# Patient Record
Sex: Male | Born: 1955
Health system: Southern US, Community
[De-identification: ages and names within clinical notes are randomized; demographics above are authoritative.]

## PROBLEM LIST (undated history)

## (undated) DIAGNOSIS — R37 Sexual dysfunction, unspecified: Secondary | ICD-10-CM

## (undated) DIAGNOSIS — H35039 Hypertensive retinopathy, unspecified eye: Secondary | ICD-10-CM

## (undated) DIAGNOSIS — H269 Unspecified cataract: Secondary | ICD-10-CM

## (undated) DIAGNOSIS — F32A Depression, unspecified: Secondary | ICD-10-CM

## (undated) DIAGNOSIS — E785 Hyperlipidemia, unspecified: Secondary | ICD-10-CM

## (undated) DIAGNOSIS — K219 Gastro-esophageal reflux disease without esophagitis: Secondary | ICD-10-CM

## (undated) DIAGNOSIS — K5792 Diverticulitis of intestine, part unspecified, without perforation or abscess without bleeding: Secondary | ICD-10-CM

## (undated) DIAGNOSIS — D689 Coagulation defect, unspecified: Secondary | ICD-10-CM

## (undated) DIAGNOSIS — G510 Bell's palsy: Secondary | ICD-10-CM

## (undated) DIAGNOSIS — R51 Headache: Secondary | ICD-10-CM

## (undated) DIAGNOSIS — J449 Chronic obstructive pulmonary disease, unspecified: Secondary | ICD-10-CM

## (undated) DIAGNOSIS — H3320 Serous retinal detachment, unspecified eye: Secondary | ICD-10-CM

## (undated) DIAGNOSIS — G4730 Sleep apnea, unspecified: Secondary | ICD-10-CM

## (undated) DIAGNOSIS — I739 Peripheral vascular disease, unspecified: Secondary | ICD-10-CM

## (undated) DIAGNOSIS — I10 Essential (primary) hypertension: Secondary | ICD-10-CM

## (undated) DIAGNOSIS — F329 Major depressive disorder, single episode, unspecified: Secondary | ICD-10-CM

## (undated) DIAGNOSIS — S62362A Nondisplaced fracture of neck of third metacarpal bone, right hand, initial encounter for closed fracture: Secondary | ICD-10-CM

## (undated) HISTORY — DX: Coagulation defect, unspecified: D68.9

## (undated) HISTORY — DX: Peripheral vascular disease, unspecified: I73.9

## (undated) HISTORY — DX: Gastro-esophageal reflux disease without esophagitis: K21.9

## (undated) HISTORY — DX: Major depressive disorder, single episode, unspecified: F32.9

## (undated) HISTORY — DX: Sexual dysfunction, unspecified: R37

## (undated) HISTORY — DX: Essential (primary) hypertension: I10

## (undated) HISTORY — DX: Headache: R51

## (undated) HISTORY — DX: Depression, unspecified: F32.A

## (undated) HISTORY — DX: Nondisplaced fracture of neck of third metacarpal bone, right hand, initial encounter for closed fracture: S62.362A

## (undated) HISTORY — DX: Hypertensive retinopathy, unspecified eye: H35.039

## (undated) HISTORY — DX: Serous retinal detachment, unspecified eye: H33.20

## (undated) HISTORY — DX: Bell's palsy: G51.0

## (undated) HISTORY — DX: Diverticulitis of intestine, part unspecified, without perforation or abscess without bleeding: K57.92

## (undated) HISTORY — DX: Unspecified cataract: H26.9

## (undated) HISTORY — DX: Hyperlipidemia, unspecified: E78.5

## (undated) HISTORY — PX: CATARACT EXTRACTION: SUR2

---

## 2007-08-01 ENCOUNTER — Ambulatory Visit: Payer: Self-pay | Admitting: Nurse Practitioner

## 2007-08-01 DIAGNOSIS — R0602 Shortness of breath: Secondary | ICD-10-CM | POA: Insufficient documentation

## 2007-08-01 DIAGNOSIS — Z87891 Personal history of nicotine dependence: Secondary | ICD-10-CM

## 2007-08-01 DIAGNOSIS — I10 Essential (primary) hypertension: Secondary | ICD-10-CM

## 2007-08-02 ENCOUNTER — Encounter (INDEPENDENT_AMBULATORY_CARE_PROVIDER_SITE_OTHER): Payer: Self-pay | Admitting: Nurse Practitioner

## 2007-08-04 ENCOUNTER — Ambulatory Visit: Payer: Self-pay | Admitting: *Deleted

## 2007-08-05 ENCOUNTER — Ambulatory Visit (HOSPITAL_COMMUNITY): Admission: RE | Admit: 2007-08-05 | Discharge: 2007-08-05 | Payer: Self-pay | Admitting: Nurse Practitioner

## 2007-08-06 DIAGNOSIS — R972 Elevated prostate specific antigen [PSA]: Secondary | ICD-10-CM | POA: Insufficient documentation

## 2007-08-06 LAB — CONVERTED CEMR LAB
Albumin: 4.8 g/dL (ref 3.5–5.2)
Basophils Relative: 0 % (ref 0–1)
Creatinine, Ser: 0.72 mg/dL (ref 0.40–1.50)
Eosinophils Absolute: 0.4 10*3/uL (ref 0.2–0.7)
Glucose, Bld: 86 mg/dL (ref 70–99)
Hemoglobin: 17 g/dL (ref 13.0–17.0)
Lymphs Abs: 2.4 10*3/uL (ref 0.7–4.0)
Monocytes Relative: 10 % (ref 3–12)
Neutrophils Relative %: 59 % (ref 43–77)
Platelets: 248 10*3/uL (ref 150–400)
Potassium: 4.6 meq/L (ref 3.5–5.3)
RBC: 5.61 M/uL (ref 4.22–5.81)
RDW: 14 % (ref 11.5–15.5)
Sodium: 138 meq/L (ref 135–145)
Total Bilirubin: 0.4 mg/dL (ref 0.3–1.2)

## 2007-08-18 ENCOUNTER — Ambulatory Visit: Payer: Self-pay | Admitting: Nurse Practitioner

## 2007-08-20 ENCOUNTER — Ambulatory Visit (HOSPITAL_COMMUNITY): Admission: RE | Admit: 2007-08-20 | Discharge: 2007-08-20 | Payer: Self-pay | Admitting: Internal Medicine

## 2007-08-20 ENCOUNTER — Encounter (INDEPENDENT_AMBULATORY_CARE_PROVIDER_SITE_OTHER): Payer: Self-pay | Admitting: Nurse Practitioner

## 2007-09-15 ENCOUNTER — Ambulatory Visit: Payer: Self-pay | Admitting: Nurse Practitioner

## 2007-09-15 DIAGNOSIS — I743 Embolism and thrombosis of arteries of the lower extremities: Secondary | ICD-10-CM | POA: Insufficient documentation

## 2007-09-24 ENCOUNTER — Encounter (INDEPENDENT_AMBULATORY_CARE_PROVIDER_SITE_OTHER): Payer: Self-pay | Admitting: Nurse Practitioner

## 2007-09-24 ENCOUNTER — Ambulatory Visit: Payer: Self-pay | Admitting: Vascular Surgery

## 2007-12-17 ENCOUNTER — Encounter (INDEPENDENT_AMBULATORY_CARE_PROVIDER_SITE_OTHER): Payer: Self-pay | Admitting: Nurse Practitioner

## 2007-12-23 ENCOUNTER — Telehealth (INDEPENDENT_AMBULATORY_CARE_PROVIDER_SITE_OTHER): Payer: Self-pay | Admitting: Nurse Practitioner

## 2007-12-24 ENCOUNTER — Ambulatory Visit: Payer: Self-pay | Admitting: Vascular Surgery

## 2008-01-19 ENCOUNTER — Ambulatory Visit: Payer: Self-pay | Admitting: Nurse Practitioner

## 2008-01-20 ENCOUNTER — Encounter (INDEPENDENT_AMBULATORY_CARE_PROVIDER_SITE_OTHER): Payer: Self-pay | Admitting: Nurse Practitioner

## 2008-01-21 LAB — CONVERTED CEMR LAB
Cholesterol: 214 mg/dL — ABNORMAL HIGH (ref 0–200)
HDL: 36 mg/dL — ABNORMAL LOW (ref >39)
Total CHOL/HDL Ratio: 5.9
Triglycerides: 228 mg/dL — ABNORMAL HIGH (ref <150)
VLDL: 46 mg/dL — ABNORMAL HIGH (ref 0–40)

## 2008-01-29 ENCOUNTER — Encounter (INDEPENDENT_AMBULATORY_CARE_PROVIDER_SITE_OTHER): Payer: Self-pay | Admitting: Nurse Practitioner

## 2008-04-16 ENCOUNTER — Telehealth (INDEPENDENT_AMBULATORY_CARE_PROVIDER_SITE_OTHER): Payer: Self-pay | Admitting: Nurse Practitioner

## 2008-04-21 ENCOUNTER — Encounter: Admission: RE | Admit: 2008-04-21 | Discharge: 2008-04-21 | Payer: Self-pay | Admitting: Vascular Surgery

## 2008-05-05 ENCOUNTER — Ambulatory Visit: Payer: Self-pay | Admitting: Vascular Surgery

## 2008-07-20 ENCOUNTER — Telehealth (INDEPENDENT_AMBULATORY_CARE_PROVIDER_SITE_OTHER): Payer: Self-pay | Admitting: Nurse Practitioner

## 2008-07-23 ENCOUNTER — Encounter (INDEPENDENT_AMBULATORY_CARE_PROVIDER_SITE_OTHER): Payer: Self-pay | Admitting: Nurse Practitioner

## 2009-03-09 ENCOUNTER — Telehealth (INDEPENDENT_AMBULATORY_CARE_PROVIDER_SITE_OTHER): Payer: Self-pay | Admitting: *Deleted

## 2009-03-24 ENCOUNTER — Encounter (INDEPENDENT_AMBULATORY_CARE_PROVIDER_SITE_OTHER): Payer: Self-pay | Admitting: Nurse Practitioner

## 2009-04-19 ENCOUNTER — Encounter (INDEPENDENT_AMBULATORY_CARE_PROVIDER_SITE_OTHER): Payer: Self-pay | Admitting: Nurse Practitioner

## 2009-04-22 ENCOUNTER — Encounter (INDEPENDENT_AMBULATORY_CARE_PROVIDER_SITE_OTHER): Payer: Self-pay | Admitting: Nurse Practitioner

## 2010-01-29 ENCOUNTER — Emergency Department (HOSPITAL_COMMUNITY): Admission: EM | Admit: 2010-01-29 | Discharge: 2010-01-29 | Payer: Self-pay | Admitting: Emergency Medicine

## 2010-08-13 DIAGNOSIS — I2510 Atherosclerotic heart disease of native coronary artery without angina pectoris: Secondary | ICD-10-CM

## 2010-08-13 HISTORY — DX: Atherosclerotic heart disease of native coronary artery without angina pectoris: I25.10

## 2010-09-03 ENCOUNTER — Encounter: Payer: Self-pay | Admitting: Vascular Surgery

## 2010-10-29 LAB — DIFFERENTIAL
Eosinophils Absolute: 0.3 10*3/uL (ref 0.0–0.7)
Eosinophils Relative: 3 % (ref 0–5)
Lymphocytes Relative: 19 % (ref 12–46)
Lymphs Abs: 2.4 10*3/uL (ref 0.7–4.0)
Monocytes Absolute: 1.3 10*3/uL — ABNORMAL HIGH (ref 0.1–1.0)
Monocytes Relative: 10 % (ref 3–12)
Neutro Abs: 8.6 10*3/uL — ABNORMAL HIGH (ref 1.7–7.7)
Neutrophils Relative %: 68 % (ref 43–77)

## 2010-10-29 LAB — COMPREHENSIVE METABOLIC PANEL
ALT: 26 U/L (ref 0–53)
AST: 17 U/L (ref 0–37)
Albumin: 3.9 g/dL (ref 3.5–5.2)
Alkaline Phosphatase: 86 U/L (ref 39–117)
BUN: 20 mg/dL (ref 6–23)
Calcium: 8.8 mg/dL (ref 8.4–10.5)
GFR calc non Af Amer: >60 mL/min (ref >60)
Total Bilirubin: 0.6 mg/dL (ref 0.3–1.2)

## 2010-10-29 LAB — CBC
MCV: 89.8 fL (ref 78.0–100.0)
Platelets: 245 10*3/uL (ref 150–400)
WBC: 12.7 10*3/uL — ABNORMAL HIGH (ref 4.0–10.5)

## 2010-10-29 LAB — LIPASE, BLOOD: Lipase: 25 U/L (ref 11–59)

## 2010-10-29 LAB — URINALYSIS, ROUTINE W REFLEX MICROSCOPIC
Bilirubin Urine: NEGATIVE
Ketones, ur: NEGATIVE mg/dL
Nitrite: NEGATIVE
Specific Gravity, Urine: 1.02 (ref 1.005–1.030)
pH: 5.5 (ref 5.0–8.0)

## 2010-10-29 LAB — URINE CULTURE

## 2010-11-09 ENCOUNTER — Encounter: Payer: Self-pay | Admitting: Nurse Practitioner

## 2010-11-09 ENCOUNTER — Encounter (INDEPENDENT_AMBULATORY_CARE_PROVIDER_SITE_OTHER): Payer: Self-pay | Admitting: Nurse Practitioner

## 2010-11-09 DIAGNOSIS — R079 Chest pain, unspecified: Secondary | ICD-10-CM | POA: Insufficient documentation

## 2010-11-09 LAB — CONVERTED CEMR LAB
AST: 23 units/L (ref 0–37)
BUN: 21 mg/dL (ref 6–23)
Basophils Absolute: 0.1 10*3/uL (ref 0.0–0.1)
CO2: 24 meq/L (ref 19–32)
Eosinophils Absolute: 0.4 10*3/uL (ref 0.0–0.7)
Glucose, Urine, Semiquant: NEGATIVE
HCT: 47.2 % (ref 39.0–52.0)
HDL: 39 mg/dL — ABNORMAL LOW (ref >39)
LDL Cholesterol: 139 mg/dL — ABNORMAL HIGH (ref 0–99)
MCHC: 34.1 g/dL (ref 30.0–36.0)
Monocytes Absolute: 1.2 10*3/uL — ABNORMAL HIGH (ref 0.1–1.0)
Monocytes Relative: 11 % (ref 3–12)
Neutro Abs: 6.5 10*3/uL (ref 1.7–7.7)
Neutrophils Relative %: 60 % (ref 43–77)
OCCULT 1: NEGATIVE
RDW: 14 % (ref 11.5–15.5)
Sodium: 137 meq/L (ref 135–145)
Specific Gravity, Urine: >=1.03
TSH: 2.252 microintl units/mL (ref 0.350–4.500)
Triglycerides: 278 mg/dL — ABNORMAL HIGH (ref <150)
Urobilinogen, UA: 0.2
WBC: 10.8 10*3/uL — ABNORMAL HIGH (ref 4.0–10.5)
pH: 5

## 2010-11-14 NOTE — Assessment & Plan Note (Signed)
Summary: Complete Physical Exam   Vital Signs:  Patient profile:   55 year old male Weight:      260.9 pounds BMI:     36.01 Temp:     98.0 degrees F oral Pulse rate:   80 / minute Pulse rhythm:   regular Resp:     20 per minute BP sitting:   139 / 88  (left arm) Cuff size:   large  Vitals Entered By: Levon Hedger (November 09, 2010 3:46 PM)  Nutrition Counseling: Patient's BMI is greater than 25 and therefore counseled on weight management options. CC: CPE...discuss chest pain, Hypertension Management Is Patient Diabetic? No Pain Assessment Patient in pain? no       Does patient need assistance? Functional Status Self care Ambulation Normal  Vision Screening:Left eye w/o correction: 20 / 20-1 Right Eye w/o correction: 20 / 40 Both eyes w/o correction:  20/ 25        Vision Entered By: Levon Hedger (November 09, 2010 3:58 PM)   CC:  CPE...discuss chest pain and Hypertension Management.  History of Present Illness:  Pt has not been here in over 2 years because he had insurance. He had been going to [place] at [facility]  Pt into the office for a complete physical exam       This is a 55 year old man who presents with Chest Pain.  The patient reports resting chest pain and exertional chest pain.  The pain is described as pressure-like.  The pain is located in the left anterior chest.  The pain radiates to the left arm.  Episodes of chest pain last 2-5 minutes.  The pain is brought on or made worse by any activity.  The pain is relieved or improved with rest.    Tetanus - been 10 years since last tetanus  Obesity - pt had lost 25 pounds since his last visit here and he has since gained some of it back.    Hypertension History:      He complains of chest pain, but denies headache and palpitations.  He notes no problems with any antihypertensive medication side effects.        Positive major cardiovascular risk factors include male age 56 years old or older,  hypertension, and current tobacco user.  Negative major cardiovascular risk factors include negative family history for ischemic heart disease.        Positive history for target organ damage include peripheral vascular disease.     Habits & Providers  Alcohol-Tobacco-Diet     Alcohol drinks/day: <1     Alcohol Counseling: not indicated; patient does not drink     Alcohol type: beer     Tobacco Status: current     Tobacco Counseling: to quit use of tobacco products     Cigarette Packs/Day: 1     Year Started: 30 years  Exercise-Depression-Behavior     Does Patient Exercise: no     Have you felt down or hopeless? no     Have you felt little pleasure in things? no     Depression Counseling: not indicated; screening negative for depression     Drug Use: never     Seat Belt Use: 100     Sun Exposure: occasionally  Allergies (verified): No Known Drug Allergies  Social History: Drug Use:  never  Review of Systems General:  Denies fever. Eyes:  Denies blurring. ENT:  Denies earache. CV:  Complains of chest pain or discomfort.  Resp:  Denies cough. GI:  Denies nausea and vomiting. GU:  Denies dysuria. MS:  Denies joint pain. Derm:  Denies dryness. Neuro:  Denies headaches. Psych:  Denies anxiety and depression.  Physical Exam  General:  alert.   Head:  normocephalic.   Eyes:  pupils round.   Ears:  bil TM with bony landmarks present Nose:  no nasal discharge.   Mouth:  fair dentition.   Neck:  supple.   Chest Wall:  no masses.   Breasts:  no gynecomastia.   Lungs:  normal breath sounds.   Heart:  normal rate and regular rhythm.   Abdomen:  obese Rectal:  external hemorrhoid(s).   Genitalia:  circumcised.   Prostate:  1+ enlarged.   Msk:  normal ROM.   Pulses:  R radial normal and L radial normal.   Extremities:  no edema Neurologic:  alert & oriented X3.   Skin:  color normal.   Psych:  Oriented X3.     Impression & Recommendations:  Problem # 1:  HEALTH  SCREENING (ICD-V70.0) rec optho and dental  Orders: Hemoccult Guaiac-1 spec.(in office) (82270) UA Dipstick w/o Micro (manual) (18841) T-GC Probe, urine (66063-01601)  Problem # 2:  CHEST PAIN (ICD-786.50) CP is concerning - pt needs stress test Orders: Cardiology Other (Cardiology Other)  Problem # 3:  PROSTATE SPECIFIC ANTIGEN, ELEVATED (ICD-790.93) biopy done by urology and was normal. PSA is still elevated Orders: T-PSA (09323-55732)  Problem # 4:  TOBACCO ABUSE (ICD-305.1) advised cessation His updated medication list for this problem includes:    Chantix Starting Month Pak 0.5 Mg X 11 & 1 Mg X 42 Misc (Varenicline tartrate) .Marland Kitchen... Take as directed  Problem # 5:  PERIPHERAL VASCULAR DISEASE (ICD-443.9)  His updated medication list for this problem includes:    Pletal 50 Mg Tabs (Cilostazol) .Marland Kitchen... 2 tablet by mouth two times a day for circulation  Problem # 6:  HYPERTENSION, BENIGN ESSENTIAL (ICD-401.1)  His updated medication list for this problem includes:    Lisinopril 40 Mg Tabs (Lisinopril) .Marland Kitchen... 1 tablet by mouth daily for blood pressure  Orders: T-TSH (20254-27062) T-Syphilis Test (RPR) [id number]) Rapid HIV  (61607) T-CBC w/Diff (37106-26948) T-Comprehensive Metabolic Panel (54627-03500) EKG w/ Interpretation (93000) T-Lipid Profile (93818-29937)  Complete Medication List: 1)  Lisinopril 40 Mg Tabs (Lisinopril) .Marland Kitchen.. 1 tablet by mouth daily for blood pressure 2)  Aspirin Ec 325 Mg Tbec (Aspirin) .Marland Kitchen.. 1 tablet by mouth daily for circulation 3)  Pletal 50 Mg Tabs (Cilostazol) .... 2 tablet by mouth two times a day for circulation 4)  Chantix Starting Month Pak 0.5 Mg X 11 & 1 Mg X 42 Misc (Varenicline tartrate) .... Take as directed 5)  Crestor 10 Mg Tabs (Rosuvastatin calcium) .Marland Kitchen.. 1 tablet by mouth nightly  Hypertension Assessment/Plan:      The patient's hypertensive risk group is category C: Target organ damage and/or diabetes.  His calculated 10 year  risk of coronary heart disease is 22 %.  Today's blood pressure is 139/88.  His blood pressure goal is < 140/90.  Patient Instructions: 1)  Your labs will be checked today and you will be notified of the results. 2)  You will be referred to cardiology. You need a stress test. 3)  Your tetanus is due but none is available in office   Orders Added: 1)  Est. Patient age 23-64 [29] 2)  Hemoccult Guaiac-1 spec.(in office) [82270] 3)  UA Dipstick w/o Micro (manual) [81002] 4)  T-TSH [  04540-98119] 5)  T-Syphilis Test (RPR) [id number] 6)  Rapid HIV  [92370] 7)  T-CBC w/Diff [30865-78469] 8)  T-PSA [62952-84132] 9)  T-Comprehensive Metabolic Panel [80053-22900] 10)  EKG w/ Interpretation [93000] 11)  T-Lipid Profile [80061-22930] 12)  Cardiology Other [Cardiology Other] 13)  T-GC Probe, urine [phone]    Laboratory Results   Urine Tests  Date/Time Received: November 09, 2010 4:10 PM   Routine Urinalysis   Color: lt. yellow Appearance: Clear Glucose: negative   (Normal Range: Negative) Bilirubin: negative   (Normal Range: Negative) Ketone: negative   (Normal Range: Negative) Spec. Gravity: >=1.030   (Normal Range: 1.003-1.035) Blood: trace-intact   (Normal Range: Negative) pH: 5.0   (Normal Range: 5.0-8.0) Protein: negative   (Normal Range: Negative) Urobilinogen: 0.2   (Normal Range: 0-1) Nitrite: negative   (Normal Range: Negative) Leukocyte Esterace: negative   (Normal Range: Negative)      Stool - Occult Blood Hemmoccult #1: negative Date: 11/09/2010    Prevention & Chronic Care Immunizations   Influenza vaccine: Not documented    Tetanus booster: Not documented   Td booster deferral: Not available  (11/09/2010)    Pneumococcal vaccine: Not documented  Colorectal Screening   Hemoccult: Not documented    Colonoscopy: normal per pt - [facility]  (08/13/2008)  Other Screening   PSA: 6.95  (01/19/2008)   PSA ordered.   Smoking status: current   (11/09/2010)   Smoking cessation counseling: yes  (01/19/2008)  Lipids   Total Cholesterol: 214  (01/19/2008)   LDL: 132  (01/19/2008)   LDL Direct: Not documented   HDL: 36  (01/19/2008)   Triglycerides: 228  (01/19/2008)  Hypertension   Last Blood Pressure: 139 / 88  (11/09/2010)   Serum creatinine: 0.72  (08/02/2007)   Serum potassium 4.6  (08/02/2007) CMP ordered   Self-Management Support :    Hypertension self-management support: Not documented   Laboratory Results   Urine Tests    Routine Urinalysis   Color: lt. yellow Appearance: Clear Glucose: negative   (Normal Range: Negative) Bilirubin: negative   (Normal Range: Negative) Ketone: negative   (Normal Range: Negative) Spec. Gravity: >=1.030   (Normal Range: 1.003-1.035) Blood: trace-intact   (Normal Range: Negative) pH: 5.0   (Normal Range: 5.0-8.0) Protein: negative   (Normal Range: Negative) Urobilinogen: 0.2   (Normal Range: 0-1) Nitrite: negative   (Normal Range: Negative) Leukocyte Esterace: negative   (Normal Range: Negative)      Stool - Occult Blood Hemmoccult #1: negative   Appended Document: Complete Physical Exam     Allergies: No Known Drug Allergies   Complete Medication List: 1)  Lisinopril 40 Mg Tabs (Lisinopril) .Marland Kitchen.. 1 tablet by mouth daily for blood pressure 2)  Aspirin Ec 325 Mg Tbec (Aspirin) .Marland Kitchen.. 1 tablet by mouth daily for circulation 3)  Pletal 50 Mg Tabs (Cilostazol) .... 2 tablet by mouth two times a day for circulation 4)  Chantix Starting Month Pak 0.5 Mg X 11 & 1 Mg X 42 Misc (Varenicline tartrate) .... Take as directed 5)  Crestor 10 Mg Tabs (Rosuvastatin calcium) .Marland Kitchen.. 1 tablet by mouth nightly      Laboratory Results  Date/Time Received: November 09, 2010 5:13 PM   Other Tests  Rapid HIV: negative

## 2010-11-16 ENCOUNTER — Ambulatory Visit: Payer: Self-pay | Admitting: Cardiovascular Disease

## 2010-12-12 ENCOUNTER — Encounter: Payer: Self-pay | Admitting: *Deleted

## 2010-12-12 ENCOUNTER — Encounter: Payer: Self-pay | Admitting: Cardiovascular Disease

## 2010-12-14 ENCOUNTER — Encounter: Payer: Self-pay | Admitting: Cardiovascular Disease

## 2010-12-14 ENCOUNTER — Ambulatory Visit (INDEPENDENT_AMBULATORY_CARE_PROVIDER_SITE_OTHER): Payer: Self-pay | Admitting: Cardiovascular Disease

## 2010-12-14 VITALS — BP 122/88 | HR 93 | Ht 72.0 in | Wt 264.0 lb

## 2010-12-14 DIAGNOSIS — R079 Chest pain, unspecified: Secondary | ICD-10-CM

## 2010-12-14 DIAGNOSIS — I739 Peripheral vascular disease, unspecified: Secondary | ICD-10-CM

## 2010-12-14 NOTE — Progress Notes (Signed)
History of Present Illness:55 yo WM with history of HTN, PAD, hyperlipidemia, depression here today for new patient evaluation of chest pain. He is a smoker and is currently smoking. He tells me that he has been getting sub-sternal chest pains for the last year. This occurs about once per month. This can happen at rest or with exertion. There is radiation of pain into the left arm. There is associated nausea. The pain lasts for five minutes and resolves. No prior cardiac issues. He was told in the past that he had poor circulation in his legs. He describes cramping type pain in both buttocks, thighs and calf muscles with walking. This occurs with walking short distances. This does not occur at rest. No ulcerations on his feet.   Past Medical History  Diagnosis Date  . HTN (hypertension)   . Diverticulitis   . PVD (peripheral vascular disease)   . Chest pain   . Headache   . Light-headedness   . Shortness of breath   . Calf cramp   . Fatigue   . Depression   . Sexual dysfunction     No past surgical history on file.  Current Outpatient Prescriptions  Medication Sig Dispense Refill  . aspirin 325 MG tablet Take 325 mg by mouth daily.        . cilostazol (PLETAL) 50 MG tablet 2 tabs po bid       . lisinopril (PRINIVIL,ZESTRIL) 40 MG tablet Take 40 mg by mouth daily.        . rosuvastatin (CRESTOR) 40 MG tablet Take 40 mg by mouth daily.        . varenicline (CHANTIX PAK) 0.5 MG X 11 & 1 MG X 42 tablet Take by mouth as needed.        Marland Kitchen DISCONTD: rosuvastatin (CRESTOR) 10 MG tablet Take 40 mg by mouth daily.         No Known Allergies  History   Social History  . Marital Status: Single    Spouse Name: N/A    Number of Children: N/A  . Years of Education: N/A   Occupational History  . Not on file.   Social History Main Topics  . Smoking status: Current Some Day Smoker  . Smokeless tobacco: Not on file  . Alcohol Use: Yes     occasional  . Drug Use: No     not since 1999  .  Sexually Active: Not on file   Other Topics Concern  . Not on file   Social History Narrative  . No narrative on file    Family History  Problem Relation Age of Onset  . Hypertension Mother   . Diabetes Brother     Review of Systems:  As stated in the HPI and otherwise negative.   BP 122/88  Pulse 93  Ht 6' (1.829 m)  Wt 264 lb (119.75 kg)  BMI 35.80 kg/m2  Physical Examination: General: Well developed, well nourished, NAD HEENT: OP clear, mucus membranes moist SKIN: warm, dry. No rashes. Neuro: No focal deficits Musculoskeletal: Muscle strength 5/5 all ext Psychiatric: Mood and affect normal Neck: No JVD, no carotid bruits, no thyromegaly, no lymphadenopathy. Lungs:Clear bilaterally, no wheezes, rhonci, crackles Cardiovascular: Regular rate and rhythm. No murmurs, gallops or rubs. Abdomen:Soft. Bowel sounds present. Non-tender.  Extremities: No lower extremity edema. Pulses are 2 + in the bilateral DP/PT.  EKG:NSR, rate 93 bpm. Normal EKG.

## 2010-12-14 NOTE — Patient Instructions (Signed)
Your physician has requested that you have a lexiscan myoview. For further information please visit [email or web address] Please follow instruction sheet, as given.  Your physician has requested that you have a lower extremity arterial duplex. This test is an ultrasound of the arteries in the legs. It looks at arterial blood flow in the legs. Allow one hour for Lower Arterial scans. There are no restrictions or special instructions  Your physician recommends that you schedule a follow-up appointment in: 3 weeks.  Your physician recommends that you continue on your current medications as directed. Please refer to the Current Medication list given to you today.

## 2010-12-14 NOTE — Assessment & Plan Note (Signed)
Will get full Lower ext dopplers to assess.

## 2010-12-14 NOTE — Assessment & Plan Note (Signed)
Chest pain with typical and atypical features but multiple cardiac risk factors including HTN, hyperlipidemia and tobacco abuse. He cannot walk on a treadmill because of leg pain. Will arrange Lexiscan myoview to exclude ischemia.

## 2010-12-26 ENCOUNTER — Telehealth: Payer: Self-pay | Admitting: Cardiology

## 2010-12-26 ENCOUNTER — Encounter (INDEPENDENT_AMBULATORY_CARE_PROVIDER_SITE_OTHER): Payer: Self-pay | Admitting: *Deleted

## 2010-12-26 ENCOUNTER — Ambulatory Visit (HOSPITAL_COMMUNITY): Payer: Self-pay | Attending: Cardiovascular Disease | Admitting: Radiology

## 2010-12-26 VITALS — Ht 72.0 in | Wt 264.0 lb

## 2010-12-26 DIAGNOSIS — R0789 Other chest pain: Secondary | ICD-10-CM

## 2010-12-26 DIAGNOSIS — R079 Chest pain, unspecified: Secondary | ICD-10-CM | POA: Insufficient documentation

## 2010-12-26 DIAGNOSIS — R0989 Other specified symptoms and signs involving the circulatory and respiratory systems: Secondary | ICD-10-CM

## 2010-12-26 DIAGNOSIS — I739 Peripheral vascular disease, unspecified: Secondary | ICD-10-CM

## 2010-12-26 MED ORDER — TECHNETIUM TC 99M TETROFOSMIN IV KIT
10.0000 | PACK | Freq: Once | INTRAVENOUS | Status: AC | PRN
  Administered 2010-12-26: 10 via INTRAVENOUS

## 2010-12-26 MED ORDER — REGADENOSON 0.4 MG/5ML IV SOLN
0.4000 mg | Freq: Once | INTRAVENOUS | Status: AC
  Administered 2010-12-26: 0.4 mg via INTRAVENOUS

## 2010-12-26 MED ORDER — TECHNETIUM TC 99M TETROFOSMIN IV KIT
33.0000 | PACK | Freq: Once | INTRAVENOUS | Status: AC | PRN
  Administered 2010-12-26: 33 via INTRAVENOUS

## 2010-12-26 NOTE — Assessment & Plan Note (Signed)
OFFICE VISIT   Manzi, Laquon  DOB:  [birth year]                                       09/24/2007  EAVWU#:[id number]   The patient is a 55 year old male who complains of pain in both of his  legs when walking less than 50 yards.  He states that the pain starts in  the upper leg and proceeds all the way to the ankle.  He denies any  buttock claudication.  He states his symptoms have been present greater  than a year.  His atherosclerotic risk factors include hypertension,  which he has had for 10 years.  He also smokes 1 pack of cigarettes per  day.  He does not know if his cholesterol is elevated.  He has no  history of coronary artery disease.  He denies rest pain.  Denies  previous trauma to his lower extremities.  He currently has been taking  cilostazol for his pain with no benefit.  He has been on this 3 to 4  weeks.   PAST MEDICAL HISTORY:  Otherwise, fairly unremarkable.   FAMILY HISTORY:  Father had thrombophlebitis.  His sister had a  myocardial infarction at age 73.  His brother apparently has vascular  disease in his legs.   MEDICATIONS:  Lisinopril 140 mg once a day.  Cilostazol 100 mg twice a  day.   He has no known drug allergies.   He has had no prior operations.   SOCIAL HISTORY:  He is single.  He is an unemployed Investment banker, operational.  He has 2  children.  He currently smokes a pack of cigarettes a day.  He drinks 6  to 7 beers per week.   REVIEW OF SYSTEMS:  He has had some recent weight gain.  He is 6 feet  tall at 265 pounds.  He states his weight gain has occurred since he has  been unable to run because of his legs.  CARDIAC:  He becomes short of breath when lying flat and sometimes with  exertion.  He denies any history of asthma or wheezing.  He has no history of GI bleeding or peptic ulcer disease.  He has no  history of renal dysfunction, TIA, or stroke.  He does have some  occasional headache, muscle pain, and depression.   PHYSICAL  EXAM:  Blood pressure 130/88 on the left arm, heart rate is 95  and regular.  HEENT:  Unremarkable.  He has 2+ carotid pulses without  bruit.  Chest is clear to auscultation.  Cardiac exam is regular rate  and rhythm.  Abdomen is slightly obese, soft, nontender, nondistended.  No pulsatile mass.  He has 2+ femoral pulses.  He has 1+ dorsalis pedis  pulse on the right.  He has absent popliteal and pedal pulses on the  left side.   He had a CT angiogram performed on January 7, which shows no significant  stenosis in the right common or external iliac artery.  There were 2  moderate stenoses in the right distal superficial femoral artery.  Popliteal artery was patent.  Tibial were not evaluated on the right  side due to contrast mild opacification.  On the left side, he did have  a less than 50% stenosis of the left common femoral artery.  There was a  high-grade stenosis of the origin of the left superficial  femoral  artery.  The profunda was patent.  The distal SFA on the left side is  occluded.  The above knee popliteal artery does reconstitute with a  patent popliteal artery and again opacification of the tibial vessels  was not ideal with the contrast bolus.   He had bilateral ABI performed today at our lab, which were 0.76 on the  left and 1.0 on the right.   The patient has physiologic evidence of left-sided superficial femoral  artery occlusive disease.  He also has evidence of this on CT angiogram.  However, I believe the first treatment plan for him should be risk  factor modification.  This includes smoking cessation and a walking  program.  I counseled him today on walking 30 minutes daily, and also  trying to quit smoking.  He currently is not at risk of limb loss due to  the adequate amount of perfusion in both lower extremities.  The risk of  limb loss lifetime should be less than 1% if he is able to quit smoking.  Additionally, his occlusive disease should not progress if  he quits  smoking.  He also should obtain benefit and improve walking distance  over time with exercise.  He does have some mild right superficial  femoral artery occlusive disease, and I believe this also would benefit  from smoking cessation and exercise.  He will follow up with me in 6  months' time for repeat ABI.   Janetta Hora. Fields, MD  Electronically Signed   CEF/MEDQ  D:  09/24/2007  T:  09/26/2007  Job:  [id number]   cc:   Dr. Cottie Banda

## 2010-12-26 NOTE — Telephone Encounter (Signed)
Reordered LE duplex and Carotid Ultrasound.

## 2010-12-26 NOTE — Progress Notes (Signed)
[facility] SITE 3 NUCLEAR MED [address] [place] [address] [phone]  Cardiology Nuclear Med Todd Weeks is a 55 y.o. male [id number] July 22, 1956   Nuclear Med Background Indication for Stress Test:  Evaluation for Ischemia History:  ~16 yrs ago GXT:OK per patient Cardiac Risk Factors: Claudication, Family History - CAD, Hypertension, Lipids, Obesity, PVD and Smoker  Symptoms:  Chest Pressure/Tightness (last episode of chest discomfort was about 6-weeks ago), Dizziness, DOE, Fatigue, Nausea and Near Syncope   Nuclear Pre-Procedure Caffeine/Decaff Intake:  None NPO After: 7:00pm   Lungs:  Clear.  O2 sat 96% on RA. IV 0.9% NS with Angio Cath:  20g  IV Site: R Hand  IV Started by:  Irean Hong, RN  Chest Size (in):  52 Cup Size: n/a  Height: 6' (1.829 m)  Weight:  264 lb (119.75 kg)  BMI:  Body mass index is 35.80 kg/(m^2). Tech Comments:  n/a    Nuclear Med Study 1 or 2 day study: 1 day  Stress Test Type:  Lexiscan  Reading MD: Arvilla Meres, MD  Order Authorizing Provider:  Verne Carrow, MD  Resting Radionuclide: Technetium 69m Tetrofosmin  Resting Radionuclide Dose: 10 mCi   Stress Radionuclide:  Technetium 16m Tetrofosmin  Stress Radionuclide Dose: 33 mCi           Stress Protocol Rest HR: 80 Stress HR: 105  Rest BP: 111/66 Stress BP: 102/54  Exercise Time (min): n/a METS: n/a   Predicted Max HR: 164 bpm % Max HR: 64.02 bpm Rate Pressure Product: 21308   Dose of Adenosine (mg):  n/a Dose of Lexiscan: 0.4 mg  Dose of Atropine (mg): n/a Dose of Dobutamine: n/a mcg/kg/min (at max HR)  Stress Test Technologist: Smiley Houseman, CMA-N  Nuclear Technologist:  Domenic Polite, CNMT     Rest Procedure:  Myocardial perfusion imaging was performed at rest 45 minutes following the intravenous administration of Technetium 96m Tetrofosmin.  Rest ECG: No acute changes.  Stress Procedure:  The patient received IV Lexiscan 0.4 mg  over 15-seconds.  Technetium 48m Tetrofosmin injected at 30-seconds.  There were no significant ST-T wave changes with Lexiscan.  There were occasional PVC's and a hypotensive response to infusion.  Quantitative spect images were obtained after a 45 minute delay.  Stress ECG: No significant change from baseline ECG  QPS Raw Data Images:  Normal; no motion artifact; normal heart/lung ratio. Stress Images:  Normal homogeneous uptake in all areas of the myocardium. Rest Images:  Normal homogeneous uptake in all areas of the myocardium. Subtraction (SDS):  Normal Transient Ischemic Dilatation (Normal <1.22):  0.99 Lung/Heart Ratio (Normal <0.45):  0.34  Quantitative Gated Spect Images QGS EDV:  133 ml QGS ESV:  68 ml QGS cine images:  Septal hypokinesis QGS EF: 49%  Impression Exercise Capacity:  Lexiscan with no exercise. BP Response:  n/a Clinical Symptoms:  n/a ECG Impression:  No significant ST segment change suggestive of ischemia. Comparison with Prior Nuclear Study: No previous nuclear study performed  Overall Impression:  Normal stress nuclear study.       Todd Weeks

## 2010-12-26 NOTE — Assessment & Plan Note (Signed)
OFFICE VISIT   Weeks, Todd  DOB:  1955/12/02                                       12/24/2007  EAVWU#:[id number]   The patient is a 55 year old male who was last seen 09/24/2007.  He  presents today for further followup.  At that time he complained of  bilateral leg pain when walking less than 50 yards.  He currently  complains of primarily a burning sensation along his right lateral leg  at the thigh level.  It does not extend up into the buttocks.  It does  not extend into the calf.  The pain has been slowly coming on for the  past few months.  He denies any back pain or previous trauma.  He denies  any frank calf claudication symptoms in the right leg.  He does continue  to have some calf claudication in the left leg.  He says this occurs  after walking approximately 20 minutes.  Unfortunately he continues to  smoke a half pack of cigarettes per day.   His atherosclerotic risk factors otherwise include hypertension.  He has  been taking cilostazol with mild improvement of his claudication  symptoms.   MEDICATIONS:  His medications continue to include lisinopril and  cilostazol.  He also takes one aspirin daily.   PHYSICAL EXAM:  Vital signs:  Blood pressure is 122/85 in the left arm.  Pulse is 90 and regular.  Vascular:  He has 2+ femoral pulses  bilaterally.  He has a 2+ right popliteal and a 1+ dorsalis pedis pulse  in the right foot.  He has absent popliteal and pedal pulses in the left  leg.  Chest:  Clear to auscultation.  Cardiac:  Regular rate and rhythm  without murmur.  He has no edema in either lower extremity.  There is no  obvious sensory deficit in the right or left lower extremity.   He had bilateral ABIs performed today which were 0.82 on the right and  0.67 on the left.  These are both slightly decreased from when he was  last seen in February.   The patient continues to have problems with his lower extremities.  His  claudication  symptoms in the left leg are fairly stable.  However, he  now has new onset of numbness, tingling and burning in the right leg.  I  do not believe these symptoms are necessarily related to arterial  occlusive disease.  His symptoms are not classically described as  claudication.  He describes more of a burning sensation in his leg at  this point.  I believe the best management would be to obtain an MRI of  the lumbar spine to see if he has any evidence of nerve root  compression.  If the MRI is negative I believe that we will probably  continue to manage him conservatively with risk factor modification  including cessation of smoking, management of his hypertension and  continued walking program with his Pletal and aspirin.  He will follow  up in 1 week for results of the MRI.   Janetta Hora. Fields, MD  Electronically Signed   CEF/MEDQ  D:  12/24/2007  T:  12/25/2007  Job:  [id number]   cc:   Lehman Prom, Dr

## 2010-12-26 NOTE — Assessment & Plan Note (Signed)
OFFICE VISIT   Todd Weeks, Todd Weeks  DOB:  06-12-1956                                       05/05/2008  BJYNW#:[id number]   The patient returns for followup today.  He was last seen in May of  2009.  He has bilateral known SFA occlusions and has bilateral lower  extremity pain.  He has been walking 30-45 minutes daily and states that  his left leg pain has actually improved somewhat.  He still describes a  burning sensation in his right thigh along the lateral aspect of his  right thigh.  He states that the burning extends from the hip down into  the thigh.  He does not describe any pain in the calf bilaterally.  He  recently underwent his lumbar MRI and this did show some nerve root  compression on the left and right side in the lumbar region.   PHYSICAL EXAMINATION:  On physical exam today blood pressure is 125/79  in the left arm, pulse is 82 and regular.  Feet are pink, warm and well-  perfused.   In light of the fact that many of his symptoms sound more neurologic  than vascular in origin and the findings of his spine MRI I believe he  warrants further evaluation of his spine as a possible cause of origin  of the burning sensation in his thighs.  I have scheduled him for  evaluation by Dr. Venetia Maxon to see if this may be pseudoclaudication type  symptoms rather than vascular in origin.  If Dr. Venetia Maxon does not think  his symptoms are related to a neurologic origin then we may further  consider whether or not to intervene on his bilateral superficial  femoral artery occlusions.  Overall he is fairly functional.  I again  advised him today that he is not currently at risk for limb loss.  He  has also quit smoking and I congratulated him on this.  He will follow  up with me with ABIs with exercise in three months' time or sooner if it  is decided that this is not primarily a neurologic cause.   Janetta Hora. Fields, MD  Electronically Signed   CEF/MEDQ  D:  05/05/2008   T:  05/06/2008  Job:  [id number]   cc:   Dr Rolena Infante. Venetia Maxon, M.D.

## 2010-12-26 NOTE — Procedures (Signed)
LOWER EXTREMITY ARTERIAL EVALUATION-SINGLE LEVEL   INDICATION:  Bilateral leg pain.  Patient complains of bilateral calf  claudication at one-half block and transient bilateral thigh numbness.   HISTORY:  Diabetes:  No.  Cardiac:  No.  Hypertension:  Yes.  Smoking:  Yes, one pack per day.  Previous Surgery:  No.   RESTING SYSTOLIC PRESSURES: (ABI)                          RIGHT                LEFT  Brachial:               110                  108  Anterior tibial:        104                  82  Posterior tibial:       110 (1.0)            84 (0.76)  Peroneal:                                    70  DOPPLER WAVEFORM ANALYSIS:  Anterior tibial:        Triphasic            Biphasic  Posterior tibial:       Triphasic            Biphasic  Peroneal:                                    Biphasic   PREVIOUS ABI'S:  Date:  RIGHT:  LEFT:   IMPRESSION:  1. Normal right ankle brachial index (1.0) with the anterior tibial      and posterior tibial arteries demonstrating triphasic waveforms.  2. Left ankle brachial index consistent with mild to moderate      occlusive arterial disease with biphasic waveforms noted at the      anterior tibial, posterior tibial, and peroneal arteries.    ___________________________________________  Janetta Hora Fields, MD   PB/MEDQ  D:  09/24/2007  T:  09/25/2007  Job:  [id number]

## 2010-12-26 NOTE — Procedures (Signed)
LOWER EXTREMITY ARTERIAL EVALUATION-SINGLE LEVEL   INDICATION:  The patient complains of severe burning and numbing of  right thigh to knee, less in left leg, for approximately 2 months.  The  feeling does not resolve.   HISTORY:  Diabetes:  No.  Cardiac:  No.  Hypertension:  Yes.  Smoking:  Yes, 1 pack per day.  Previous Surgery:  No.   RESTING SYSTOLIC PRESSURES: (ABI)                          RIGHT                LEFT  Brachial:               120                  118  Anterior tibial:        98                   Inaudible  Posterior tibial:       98 (0.82)            80 (0.67)  Peroneal:                                    66  DOPPLER WAVEFORM ANALYSIS:  Anterior tibial:        Biphasic  Posterior tibial:       Biphasic             Biphasic  Peroneal:                                    Monophasic   PREVIOUS ABI'S:  Date:  09/24/2007  RIGHT:  1.0  LEFT:  0.76   IMPRESSION:  1. Drop in right ABI consistent with mild arterial compromise.  2. Slight drop in left ABI consistent with moderate arterial      compromise.   ___________________________________________  Janetta Hora. Fields, MD   PB/MEDQ  D:  12/25/2007  T:  12/25/2007  Job:  [id number]

## 2010-12-27 NOTE — Progress Notes (Signed)
Normal Stress test. No ischemia. Can we let him know? He also had LE dopplers. chris

## 2010-12-27 NOTE — Progress Notes (Signed)
Copy routed to Dr. Clifton James.Mirna Mires

## 2010-12-27 NOTE — Progress Notes (Signed)
Attempted to call patient with no answer.  °

## 2010-12-29 ENCOUNTER — Encounter (INDEPENDENT_AMBULATORY_CARE_PROVIDER_SITE_OTHER): Payer: Self-pay | Admitting: Cardiology

## 2010-12-29 ENCOUNTER — Encounter: Payer: Self-pay | Admitting: Cardiovascular Disease

## 2010-12-29 DIAGNOSIS — I6529 Occlusion and stenosis of unspecified carotid artery: Secondary | ICD-10-CM

## 2010-12-29 DIAGNOSIS — I70219 Atherosclerosis of native arteries of extremities with intermittent claudication, unspecified extremity: Secondary | ICD-10-CM

## 2011-01-01 ENCOUNTER — Encounter: Payer: Self-pay | Admitting: Cardiovascular Disease

## 2011-01-02 NOTE — Progress Notes (Signed)
Patient has appt. 5/24

## 2011-01-04 ENCOUNTER — Encounter: Payer: Self-pay | Admitting: Cardiology

## 2011-01-04 ENCOUNTER — Encounter: Payer: Self-pay | Admitting: Cardiovascular Disease

## 2011-01-04 ENCOUNTER — Ambulatory Visit (INDEPENDENT_AMBULATORY_CARE_PROVIDER_SITE_OTHER): Payer: Self-pay | Admitting: Cardiovascular Disease

## 2011-01-04 VITALS — BP 139/86 | HR 94 | Resp 18 | Ht 72.0 in | Wt 264.0 lb

## 2011-01-04 DIAGNOSIS — I739 Peripheral vascular disease, unspecified: Secondary | ICD-10-CM

## 2011-01-04 DIAGNOSIS — R9430 Abnormal result of cardiovascular function study, unspecified: Secondary | ICD-10-CM

## 2011-01-04 LAB — CBC WITH DIFFERENTIAL/PLATELET
Eosinophils Absolute: 0.5 10*3/uL (ref 0.0–0.7)
Eosinophils Relative: 4.2 % (ref 0.0–5.0)
MCV: 91 fl (ref 78.0–100.0)
Monocytes Absolute: 1.2 10*3/uL — ABNORMAL HIGH (ref 0.1–1.0)
Neutrophils Relative %: 62.2 % (ref 43.0–77.0)
Platelets: 248 10*3/uL (ref 150.0–400.0)
WBC: 11.4 10*3/uL — ABNORMAL HIGH (ref 4.5–10.5)

## 2011-01-04 LAB — PROTIME-INR
INR: 1 ratio (ref 0.8–1.0)
Prothrombin Time: 11.6 s (ref 10.2–12.4)

## 2011-01-04 LAB — BASIC METABOLIC PANEL
Chloride: 103 mEq/L (ref 96–112)
GFR: 103.19 mL/min (ref >60.00)
Glucose, Bld: 94 mg/dL (ref 70–99)
Potassium: 4.4 mEq/L (ref 3.5–5.1)
Sodium: 136 mEq/L (ref 135–145)

## 2011-01-04 NOTE — Assessment & Plan Note (Signed)
Severe disease on non-invasive studies. Will arrange angiogram to define disease.

## 2011-01-04 NOTE — Patient Instructions (Signed)
Your physician recommends that you schedule a follow-up appointment in: 1 month with Dr. Clifton James  Your physician has requested that you have a cardiac catheterization. Cardiac catheterization is used to diagnose and/or treat various heart conditions. Doctors may recommend this procedure for a number of different reasons. The most common reason is to evaluate chest pain. Chest pain can be a symptom of coronary artery disease (CAD), and cardiac catheterization can show whether plaque is narrowing or blocking your heart's arteries. This procedure is also used to evaluate the valves, as well as measure the blood flow and oxygen levels in different parts of your heart. For further information please visit [email or web address] Please follow instruction sheet, as given.  Your physician has requested that you have a peripheral vascular angiogram. This exam is performed at the hospital. During this exam IV contrast is used to look at arterial blood flow. Please review the information sheet given for details.

## 2011-01-04 NOTE — Assessment & Plan Note (Signed)
Septal hypokinesis, reduced EF with exertional chest pain. Will arrange diagnostic left heart cath to exclude obstructive CAD. R/B reviewed with pt. Test combined with distal aortogram and LE runoff. Pt agrees to proceed. Arrange on Thursday May 31st in [facility] lab.

## 2011-01-04 NOTE — Progress Notes (Signed)
History of Present Illness:55 yo WM with history of HTN, PAD, hyperlipidemia, depression here today for cardiac follow up. I saw him three weeks ago for new patient evaluation of chest pain. He is a smoker and is currently smoking. He tells me that he has been getting sub-sternal chest pains for the last year. This occurs about once per month. This can happen at rest or with exertion. There is radiation of pain into the left arm. There is associated nausea. The pain lasts for five minutes and resolves. No prior cardiac issues. He was told in the past that he had poor circulation in his legs. He describes cramping type pain in both buttocks, thighs and calf muscles with walking. This occurs with walking short distances. This does not occur at rest. No ulcerations on his feet.   Lexiscan myoview with EF of 49%, septal hypokinesis, no large areas of ischemia. Carotid dopplers with mild bilateral disease. LE dopplers with reduced ABI bilaterally with occluded left SFA and moderate right SFA. He continues to have severe left leg pain with ambulation and chest pain with exertion. He has stopped smoking for three days.    Past Medical History  Diagnosis Date  . HTN (hypertension)   . Diverticulitis   . PVD (peripheral vascular disease)   . Chest pain   . Headache   . Light-headedness   . Shortness of breath   . Calf cramp   . Fatigue   . Depression   . Sexual dysfunction     No past surgical history on file.  Current Outpatient Prescriptions  Medication Sig Dispense Refill  . aspirin 325 MG tablet Take 325 mg by mouth daily.        . cilostazol (PLETAL) 50 MG tablet 2 tabs po bid       . lisinopril (PRINIVIL,ZESTRIL) 40 MG tablet Take 40 mg by mouth daily.        . rosuvastatin (CRESTOR) 40 MG tablet Take 40 mg by mouth daily.        . varenicline (CHANTIX PAK) 0.5 MG X 11 & 1 MG X 42 tablet Take by mouth as needed.          No Known Allergies  History   Social History  . Marital Status:  Single    Spouse Name: N/A    Number of Children: 2  . 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 Smoking status: Current Some Day Smoker  . Smokeless tobacco: Not on file   Comment: quit 3 days ago..... using Chantix  . Alcohol Use: Yes     occasional  . Drug Use: No     not since 1999  . Sexually Active: Not on file   Other Topics Concern  . Not on file   Social History Narrative  . No narrative on file    Family History  Problem Relation Age of Onset  . Hypertension Mother   . Diabetes Brother     Review of Systems:  As stated in the HPI and otherwise negative.   BP 139/86  Pulse 94  Resp 18  Ht 6' (1.829 m)  Wt 264 lb (119.75 kg)  BMI 35.80 kg/m2  Physical Examination: General: Well developed, well nourished, NAD HEENT: OP clear, mucus membranes moist SKIN: warm, dry. No rashes. Neuro: No focal deficits Musculoskeletal: Muscle strength 5/5 all ext Psychiatric: Mood and affect normal Neck: No JVD, no  carotid bruits, no thyromegaly, no lymphadenopathy. Lungs:Clear bilaterally, no wheezes, rhonci, crackles Cardiovascular: Regular rate and rhythm. No murmurs, gallops or rubs. Abdomen:Soft. Bowel sounds present. Non-tender.  Extremities: No lower extremity edema. Pulses are non-palpable in the bilateral DP/PT  Nuclear Stress Test: 12/26/10 Stress Procedure: The patient received IV Lexiscan 0.4 mg over 15-seconds. Technetium 49m Tetrofosmin injected at 30-seconds. There were no significant ST-T wave changes with Lexiscan. There were occasional PVC's and a hypotensive response to infusion. Quantitative spect images were obtained after a 45 minute delay.  Stress ECG: No significant change from baseline ECG  QPS  Raw Data Images: Normal; no motion artifact; normal heart/lung ratio.  Stress Images: Normal homogeneous uptake in all areas of the myocardium.  Rest Images: Normal homogeneous uptake in all  areas of the myocardium.  Subtraction (SDS): Normal  Transient Ischemic Dilatation (Normal <1.22): 0.99  Lung/Heart Ratio (Normal <0.45): 0.34  Quantitative Gated Spect Images  QGS EDV: 133 ml  QGS ESV: 68 ml  QGS cine images: Septal hypokinesis  QGS EF: 49%  Impression  Exercise Capacity: Lexiscan with no exercise.  BP Response: n/a  Clinical Symptoms: n/a  ECG Impression: No significant ST segment change suggestive of ischemia.  Comparison with Prior Nuclear Study: No previous nuclear study performed  Overall Impression: Normal stress nuclear study.   Carotid artery doppler: 12/29/10:  Mild bilateral carotid artery stenosis.   Lower ext arterial dopplers: 12/26/10 Right SFA with proximal >50% stenosis, Left SFA with total proximal occlusion. Right ABI 0.73, Left ABI 0.63.

## 2011-01-11 ENCOUNTER — Ambulatory Visit (HOSPITAL_COMMUNITY)
Admission: RE | Admit: 2011-01-11 | Discharge: 2011-01-11 | Disposition: A | Discharge status: Home / Self Care | Payer: Self-pay | Source: Ambulatory Visit | Attending: Cardiovascular Disease | Admitting: Cardiovascular Disease

## 2011-01-11 DIAGNOSIS — I7092 Chronic total occlusion of artery of the extremities: Secondary | ICD-10-CM | POA: Insufficient documentation

## 2011-01-11 DIAGNOSIS — I701 Atherosclerosis of renal artery: Secondary | ICD-10-CM

## 2011-01-11 DIAGNOSIS — F17200 Nicotine dependence, unspecified, uncomplicated: Secondary | ICD-10-CM | POA: Insufficient documentation

## 2011-01-11 DIAGNOSIS — I70219 Atherosclerosis of native arteries of extremities with intermittent claudication, unspecified extremity: Secondary | ICD-10-CM | POA: Insufficient documentation

## 2011-01-11 DIAGNOSIS — I10 Essential (primary) hypertension: Secondary | ICD-10-CM | POA: Insufficient documentation

## 2011-01-11 DIAGNOSIS — I70209 Unspecified atherosclerosis of native arteries of extremities, unspecified extremity: Secondary | ICD-10-CM | POA: Insufficient documentation

## 2011-01-11 DIAGNOSIS — I2510 Atherosclerotic heart disease of native coronary artery without angina pectoris: Secondary | ICD-10-CM

## 2011-01-11 DIAGNOSIS — E785 Hyperlipidemia, unspecified: Secondary | ICD-10-CM | POA: Insufficient documentation

## 2011-01-16 NOTE — Cardiovascular Report (Signed)
NAME:  Todd Weeks, Todd Weeks                  ACCOUNT NO.:  [id number]  MEDICAL RECORD NO.:  [id number]           PATIENT TYPE:  O  LOCATION:  [facility]                         FACILITY:  [facility]  PHYSICIAN:  Verne Carrow, MDDATE OF BIRTH:  Feb 05, 1956  DATE OF PROCEDURE:  01/11/2011 DATE OF DISCHARGE:  01/11/2011                           CARDIAC CATHETERIZATION   PROCEDURES PERFORMED: 1. Left heart catheterization 2. Selective coronary angiography. 3. Left ventricular angiogram. 4. Distal aortogram with bilateral lower extremity runoff.  OPERATOR:  Verne Carrow, MD  INDICATION:  This is a 55 year old Caucasian male with a history of hypertension, hyperlipidemia and tobacco abuse who I saw in the office with complaints of chest pain and leg pain consistent with claudication. He underwent a stress Myoview which showed left ventricular ejection fraction of 49% with septal hypokinesis.  He also had lower extremity Dopplers which showed reduced ankle brachial index bilaterally with suggestion of occluded left superficial femoral artery and moderate stenosis in the right superficial femoral artery.  Diagnostic cardiac catheterization and lower extremity angiogram was arranged for today.  PROCEDURE IN DETAIL:  The patient was brought to the main peripheral vascular laboratory after signing informed consent for the procedure. The right groin was prepped and draped in a sterile fashion.  Lidocaine 1% was used for local anesthesia.  A 5-French sheath was inserted into the right femoral artery without difficulty.  Cardiac catheterization was performed with standard diagnostic catheters.  A pigtail catheter was used to perform a left ventricular angiogram.  Pigtail catheters pulled back across the aortic valve with no significant gradient.  We then pulled the pigtail catheter back to the distal aorta just above the takeoff of bilateral renal arteries.  Angiography of the abdominal  aorta was performed.  The pigtail catheter was then pulled back to the level of the aortic bifurcation and pelvic angiography was performed.  We then used a crossover catheter to engage the left iliac system and through an end-hole catheter, we performed a runoff of the left lower extremity. Runoff of the right lower extremity was performed through the sheath in the right common femoral artery.  The patient tolerated the procedure well.  He was taken to the recovery area in stable condition.  HEMODYNAMIC FINDINGS:  Central aortic pressure 102/64.  Left ventricular pressure 101/1/5.  ANGIOGRAPHIC FINDINGS: 1. The left main coronary artery had no evidence of disease. 2. The left anterior descending was large vessel that coursed the apex     and gave off a small-caliber diagonal branch.  There was no disease     in this system. 3. Circumflex artery was composed of a small AV groove branch.  No     disease.  There was a moderate-sized bifurcating ramus intermediate     branch with no disease. 4. The right coronary artery was a dominant vessel with 30% mid     stenosis.  There were no flow-limiting lesions noted in this     vessel. 5. Left ventricular angiogram was performed in the RAO projection that     showed low normal left ventricular systolic function with ejection  fraction of 50%. 6. Distal aorta with mild plaque.  No aneurysm noted. 7. Bilateral renal arteries were patent without any disease. 8. The left iliac arteries were patent with no disease.  The left     common femoral artery was patent with no disease.  The left     superficial femoral artery had a 100% occlusion in its ostium with     reconstitution of the vessel via collaterals in the distal     superficial femoral artery.  There is three-vessel runoff to left     foot. 9. The right iliac vessels were patent with no disease.  The right     common femoral artery was patent.  There is an 80% proximal right      superficial femoral artery stenosis.  There is diffuse 30% plaque     throughout the mid and distal right superficial femoral artery.     There is three-vessel runoff to the right foot.  IMPRESSION: 1. Mild nonobstructive coronary artery disease. 2. Low normal left ventricular systolic function. 3. 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 4. Severe stenosis in the proximal right superficial femoral artery.  RECOMMENDATIONS:  At this time we will pursue medical management of the patient's coronary artery disease.  I will discuss the possibility of bringing him back at a later date for intervention of the severe stenosis in the right superficial femoral artery.  He will be discharged home today after his bedrest.     Verne Carrow, MD     CM/MEDQ  D:  01/11/2011  T:  01/11/2011  Job:  [id number]  Electronically Signed by Verne Carrow MD on 01/16/2011 09:55:35 AM

## 2011-02-02 ENCOUNTER — Encounter: Payer: Self-pay | Admitting: Cardiovascular Disease

## 2011-02-02 ENCOUNTER — Ambulatory Visit (INDEPENDENT_AMBULATORY_CARE_PROVIDER_SITE_OTHER): Payer: Self-pay | Admitting: Cardiovascular Disease

## 2011-02-02 ENCOUNTER — Encounter: Payer: Self-pay | Admitting: Cardiology

## 2011-02-02 VITALS — BP 116/86 | HR 96 | Ht 72.0 in | Wt 279.0 lb

## 2011-02-02 DIAGNOSIS — I739 Peripheral vascular disease, unspecified: Secondary | ICD-10-CM

## 2011-02-02 DIAGNOSIS — Z01810 Encounter for preprocedural cardiovascular examination: Secondary | ICD-10-CM

## 2011-02-02 DIAGNOSIS — I2510 Atherosclerotic heart disease of native coronary artery without angina pectoris: Secondary | ICD-10-CM | POA: Insufficient documentation

## 2011-02-02 NOTE — Assessment & Plan Note (Signed)
Severe disease as above. Will arrange intervention of right SFA on 04/18/11 in the PV lab. Labs week of procedure. No percutaneous options for Rota CTO in left leg. If pain remains severe, will have to consider fem/pop bypass in future. Risks and benefits reviewed.

## 2011-02-02 NOTE — Assessment & Plan Note (Signed)
Mild disease. Medical management. 

## 2011-02-02 NOTE — Progress Notes (Signed)
History of Present Illness:55 yo WM with history of HTN, PAD, hyperlipidemia, depression here today for cardiac follow up. I saw him as a new patient 6 weeks ago for evaluation of chest pain. He is a smoker and is currently smoking. He told  me that he has been getting sub-sternal chest pains for the last year. This occurs about once per month. This can happen at rest or with exertion. There is radiation of pain into the left arm. There is associated nausea. The pain lasts for five minutes and resolves. No prior cardiac issues. He was told in the past that he had poor circulation in his legs. He describesd cramping type pain in both buttocks, thighs and calf muscles with walking. This occurs with walking short distances. This does not occur at rest. No ulcerations on his feet.  I arranged a Lexiscan myoview which showed EF of 49% but no large areas of ischemia. LE dopplers with severe PAD. I arranged a distal aortogram and cardiac cath on 01/11/11. He had mild disease in the RCA but no disease in the LAD or Circumflex. His LE angiogram showed severe 80% stenosis in the proximal right SFA. The left SFA was totally occluded at the ostium with reconstitution distally in the SFA. He is here for follow up.   His legs both ache. Severe pain with ambulation in both legs. No ulcerations. No chest pain.    Past Medical History  Diagnosis Date  . HTN (hypertension)   . Diverticulitis   . PVD (peripheral vascular disease)   . Chest pain   . Headache   . Light-headedness   . Shortness of breath   . Calf cramp   . Fatigue   . Depression   . Sexual dysfunction     No past surgical history on file.  Current Outpatient Prescriptions  Medication Sig Dispense Refill  . aspirin 325 MG tablet Take 325 mg by mouth daily.        . cilostazol (PLETAL) 50 MG tablet 2 tabs po bid       . lisinopril (PRINIVIL,ZESTRIL) 40 MG tablet Take 40 mg by mouth daily.        . rosuvastatin (CRESTOR) 40 MG tablet Take 40 mg  by mouth daily.        . varenicline (CHANTIX PAK) 0.5 MG X 11 & 1 MG X 42 tablet Take by mouth as needed.          No Known Allergies  History   Social History  . Marital Status: Single    Spouse Name: N/A    Number of Children: 2  . 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 Smoking status: Current Some Day Smoker  . Smokeless tobacco: Not on file   Comment: quit 3 days ago..... using Chantix  . Alcohol Use: Yes     occasional  . Drug Use: No     not since 1999  . Sexually Active: Not on file   Other Topics Concern  . Not on file   Social History Narrative  . No narrative on file    Family History  Problem Relation Age of Onset  . Hypertension Mother   . Diabetes Brother     Review of Systems:  As stated in the HPI and otherwise negative.   BP 116/86  Pulse 96  Ht 6' (1.829 m)  Wt 279 lb (126.554 kg)  BMI  37.84 kg/m2  Physical Examination: General: Well developed, well nourished, NAD HEENT: OP clear, mucus membranes moist SKIN: warm, dry. No rashes. Neuro: No focal deficits Musculoskeletal: Muscle strength 5/5 all ext Psychiatric: Mood and affect normal Neck: No JVD, no carotid bruits, no thyromegaly, no lymphadenopathy. Lungs:Clear bilaterally, no wheezes, rhonci, crackles Cardiovascular: Regular rate and rhythm. No murmurs, gallops or rubs. Abdomen:Soft. Bowel sounds present. Non-tender.  Extremities: No lower extremity edema. Pulses are trace in the bilateral DP/PT.  Stress Myoview 12/26/10:  Stress Procedure: The patient received IV Lexiscan 0.4 mg over 15-seconds. Technetium 57m Tetrofosmin injected at 30-seconds. There were no significant ST-T wave changes with Lexiscan. There were occasional PVC's and a hypotensive response to infusion. Quantitative spect images were obtained after a 45 minute delay.  Stress ECG: No significant change from baseline ECG  QPS  Raw Data Images: Normal;  no motion artifact; normal heart/lung ratio.  Stress Images: Normal homogeneous uptake in all areas of the myocardium.  Rest Images: Normal homogeneous uptake in all areas of the myocardium.  Subtraction (SDS): Normal  Transient Ischemic Dilatation (Normal <1.22): 0.99  Lung/Heart Ratio (Normal <0.45): 0.34  Quantitative Gated Spect Images  QGS EDV: 133 ml  QGS ESV: 68 ml  QGS cine images: Septal hypokinesis  QGS EF: 49%  Impression  Exercise Capacity: Lexiscan with no exercise.  BP Response: n/a  Clinical Symptoms: n/a  ECG Impression: No significant ST segment change suggestive of ischemia.  Comparison with Prior Nuclear Study: No previous nuclear study performed  Overall Impression: Normal stress nuclear study.  LE arterial dopplers: 12/29/10: MOderate to severe right SFA stenosis, Stgermain segment left SFA total occlusion  Carotid dopplers: 12/29/10: Mild bilateral disease  Cardiac Cath/LE angiogram:01/11/11  1. The left main coronary artery had no evidence of disease.   2. The left anterior descending was large vessel that coursed the apex       and gave off a small-caliber diagonal branch.  There was no disease       in this system.   3. Circumflex artery was composed of a small AV groove branch.  No       disease.  There was a moderate-sized bifurcating ramus intermediate       branch with no disease.   4. The right coronary artery was a dominant vessel with 30% mid       stenosis.  There were no flow-limiting lesions noted in this       vessel.   5. Left ventricular angiogram was performed in the RAO projection that       showed low normal left ventricular systolic function with ejection       fraction of 50%.   6. Distal aorta with mild plaque.  No aneurysm noted.   7. Bilateral renal arteries were patent without any disease.   8. The left iliac arteries were patent with no disease.  The left       common femoral artery was patent with no disease.  The left        superficial femoral artery had a 100% occlusion in its ostium with       reconstitution of the vessel via collaterals in the distal       superficial femoral artery.  There is three-vessel runoff to left       foot.   9. The right iliac vessels were patent with no disease.  The right       common femoral artery was patent.  There is an 80% proximal right       superficial femoral artery stenosis.  There is diffuse 30% plaque       throughout the mid and distal right superficial femoral artery.       There is three-vessel runoff to the right foot.

## 2011-02-20 ENCOUNTER — Telehealth: Payer: Self-pay | Admitting: Cardiovascular Disease

## 2011-02-20 NOTE — Telephone Encounter (Signed)
Pt asked for all  (Doppler,Lower Arterial) Records to be copied from [facility] he will pick- up Wednesday 02/21/11. 02/20/11/km

## 2011-03-12 ENCOUNTER — Telehealth: Payer: Self-pay | Admitting: Cardiovascular Disease

## 2011-03-12 NOTE — Telephone Encounter (Signed)
Discuss disability

## 2011-03-12 NOTE — Telephone Encounter (Signed)
Patient wanted to inform us that he just started a new job and is having a lot of pain in his legs. His next PV procedure is in September. He is filing for short term disability and wanted to inform us that they should be contacting us in the next few weeks to discuss this. I advised him that we would complete the paperwork when sent and to contact us in the next few weeks for an update.

## 2011-04-11 ENCOUNTER — Other Ambulatory Visit (INDEPENDENT_AMBULATORY_CARE_PROVIDER_SITE_OTHER): Payer: Self-pay | Admitting: *Deleted

## 2011-04-11 DIAGNOSIS — Z01810 Encounter for preprocedural cardiovascular examination: Secondary | ICD-10-CM

## 2011-04-12 LAB — CBC WITH DIFFERENTIAL/PLATELET
Basophils Relative: 0.5 % (ref 0.0–3.0)
Eosinophils Relative: 4 % (ref 0.0–5.0)
Lymphocytes Relative: 24 % (ref 12.0–46.0)
Monocytes Relative: 9.2 % (ref 3.0–12.0)
Neutrophils Relative %: 62.3 % (ref 43.0–77.0)
RBC: 4.82 Mil/uL (ref 4.22–5.81)
WBC: 8.9 10*3/uL (ref 4.5–10.5)

## 2011-04-12 LAB — BASIC METABOLIC PANEL
Calcium: 9 mg/dL (ref 8.4–10.5)
Chloride: 106 mEq/L (ref 96–112)
Creatinine, Ser: 0.6 mg/dL (ref 0.4–1.5)

## 2011-04-12 LAB — PROTIME-INR: INR: 1 ratio (ref 0.8–1.0)

## 2011-04-18 ENCOUNTER — Ambulatory Visit (HOSPITAL_COMMUNITY)
Admission: RE | Admit: 2011-04-18 | Discharge: 2011-04-18 | Disposition: A | Discharge status: Home / Self Care | Payer: Self-pay | Source: Ambulatory Visit | Attending: Cardiovascular Disease | Admitting: Cardiovascular Disease

## 2011-04-18 DIAGNOSIS — E785 Hyperlipidemia, unspecified: Secondary | ICD-10-CM | POA: Insufficient documentation

## 2011-04-18 DIAGNOSIS — I70219 Atherosclerosis of native arteries of extremities with intermittent claudication, unspecified extremity: Secondary | ICD-10-CM

## 2011-04-18 DIAGNOSIS — I10 Essential (primary) hypertension: Secondary | ICD-10-CM | POA: Insufficient documentation

## 2011-04-18 DIAGNOSIS — I70209 Unspecified atherosclerosis of native arteries of extremities, unspecified extremity: Secondary | ICD-10-CM | POA: Insufficient documentation

## 2011-04-18 DIAGNOSIS — F17200 Nicotine dependence, unspecified, uncomplicated: Secondary | ICD-10-CM | POA: Insufficient documentation

## 2011-04-18 LAB — POCT ACTIVATED CLOTTING TIME: Activated Clotting Time: 199 seconds

## 2011-04-19 NOTE — Procedures (Signed)
  Weeks, Todd                  ACCOUNT NO.:  [id number]  MEDICAL RECORD NO.:  [id number]  LOCATION:  [facility]                         FACILITY:  [facility]  PHYSICIAN:  Verne Carrow, MDDATE OF BIRTH:  04/01/1956  DATE OF PROCEDURE:  04/18/2011 DATE OF DISCHARGE:                   PERIPHERAL VASCULAR INVASIVE PROCEDURE   PROCEDURE PERFORMED:  Percutaneous transluminal angioplasty with placement of a stent in the proximal right superficial femoral artery.  OPERATOR:  Verne Carrow, MD  ARTERIAL ACCESS SITE:  Left femoral artery.  INDICATIONS:  This is a 55 year old male with history of hypertension, tobacco abuse and peripheral arterial disease as well as hyperlipidemia who underwent distal aortogram with bilateral lower extremity runoff on Jan 11, 2011.  He was found to have chronic total occlusion of the entire left superficial femoral artery.  He was also found to have severe stenosis in the proximal right superficial femoral artery.  He was brought back today for intervention on the severe stenosis in the right superficial femoral artery.  The plan going forward would be that if he has rest pain or ulcerations in his left leg he would be referred for possible femoral popliteal bypass in the left leg.  DETAILS OF PROCEDURE:  The patient was brought to the main peripheral vascular laboratory after signing informed consent for the procedure. The left groin was prepped and draped in a sterile fashion.  Lidocaine 1% was used for local anesthesia.  A 7-French Terumo destination sheath was advanced into the left iliac system.  We then used a crossover catheter to gain access into the right iliac system.  The sheath was then advanced around over a wire into the distal portion of the right external iliac artery.  Angiography of the proximal right superficial femoral artery was performed.  We then placed a 7.0 x 60-mm Absolute Pro stent in the proximal portion of the right  superficial femoral artery. The stent was postdilated with a 6.0 x 60-mm Mustang balloon.  The stenosis was taken from 80% to 0%.  There were no complications.  The patient was given 600 mg of Plavix on the cath table.  Heparin was used for anticoagulation.  The patient was taken to the recovery area in stable condition.  IMPRESSION:  Successful percutaneous transluminal angioplasty with placement of a stent in the right superficial femoral artery.  RECOMMENDATIONS:  At this time, we will continue to encourage smoking cessation.  We will continue aspirin and Plavix on a daily basis.  We will discontinue his Pletal.  He will be watched closely for 4 hours and then discharged home today.     Verne Carrow, MD     CM/MEDQ  D:  04/18/2011  T:  04/18/2011  Job:  [id number]  Electronically Signed by Verne Carrow MD on 04/19/2011 05:32:41 PM

## 2011-05-28 ENCOUNTER — Other Ambulatory Visit: Payer: Self-pay | Admitting: Cardiovascular Disease

## 2011-05-28 DIAGNOSIS — I6529 Occlusion and stenosis of unspecified carotid artery: Secondary | ICD-10-CM

## 2011-06-01 ENCOUNTER — Other Ambulatory Visit: Payer: Self-pay | Admitting: *Deleted

## 2011-06-01 ENCOUNTER — Encounter: Payer: Self-pay | Admitting: *Deleted

## 2011-06-01 ENCOUNTER — Other Ambulatory Visit: Payer: Self-pay | Admitting: Cardiovascular Disease

## 2011-06-01 ENCOUNTER — Encounter (INDEPENDENT_AMBULATORY_CARE_PROVIDER_SITE_OTHER): Payer: Self-pay | Admitting: *Deleted

## 2011-06-01 DIAGNOSIS — I739 Peripheral vascular disease, unspecified: Secondary | ICD-10-CM

## 2011-06-17 ENCOUNTER — Encounter (HOSPITAL_COMMUNITY): Payer: Self-pay | Admitting: Emergency Medicine

## 2011-06-17 ENCOUNTER — Emergency Department (HOSPITAL_COMMUNITY): Payer: Self-pay

## 2011-06-17 ENCOUNTER — Emergency Department (HOSPITAL_COMMUNITY)
Admission: EM | Admit: 2011-06-17 | Discharge: 2011-06-17 | Disposition: A | Discharge status: Home / Self Care | Payer: Self-pay | Attending: Emergency Medicine | Admitting: Emergency Medicine

## 2011-06-17 DIAGNOSIS — L723 Sebaceous cyst: Secondary | ICD-10-CM | POA: Insufficient documentation

## 2011-06-17 LAB — DIFFERENTIAL
Eosinophils Absolute: 0.3 10*3/uL (ref 0.0–0.7)
Eosinophils Relative: 3 % (ref 0–5)
Lymphocytes Relative: 17 % (ref 12–46)
Lymphs Abs: 1.9 10*3/uL (ref 0.7–4.0)
Monocytes Absolute: 0.9 10*3/uL (ref 0.1–1.0)

## 2011-06-17 LAB — CBC
HCT: 45.7 % (ref 39.0–52.0)
MCH: 30.8 pg (ref 26.0–34.0)
MCV: 88.6 fL (ref 78.0–100.0)
Platelets: 247 10*3/uL (ref 150–400)
RBC: 5.16 MIL/uL (ref 4.22–5.81)
RDW: 13.1 % (ref 11.5–15.5)
WBC: 11.3 10*3/uL — ABNORMAL HIGH (ref 4.0–10.5)

## 2011-06-17 MED ORDER — HYDROCODONE-ACETAMINOPHEN 5-325 MG PO TABS: 2.0000 | ORAL_TABLET | ORAL | Status: AC | PRN

## 2011-06-17 MED ORDER — ONDANSETRON HCL 4 MG/2ML IJ SOLN
4.0000 mg | Freq: Once | INTRAMUSCULAR | Status: AC
  Administered 2011-06-17: 4 mg via INTRAVENOUS
  Filled 2011-06-17: qty 2

## 2011-06-17 MED ORDER — MORPHINE SULFATE 4 MG/ML IJ SOLN
4.0000 mg | Freq: Once | INTRAMUSCULAR | Status: AC
  Administered 2011-06-17: 4 mg via INTRAVENOUS
  Filled 2011-06-17: qty 1

## 2011-06-17 MED ORDER — CLINDAMYCIN PHOSPHATE 600 MG/50ML IV SOLN
600.0000 mg | Freq: Once | INTRAVENOUS | Status: AC
  Administered 2011-06-17: 600 mg via INTRAVENOUS
  Filled 2011-06-17: qty 50

## 2011-06-17 MED ORDER — IOHEXOL 300 MG/ML  SOLN
80.0000 mL | Freq: Once | INTRAMUSCULAR | Status: AC | PRN
  Administered 2011-06-17: 80 mL via INTRAVENOUS

## 2011-06-17 MED ORDER — CLINDAMYCIN HCL 150 MG PO CAPS
300.0000 mg | ORAL_CAPSULE | Freq: Three times a day (TID) | ORAL | Status: DC
  Administered 2011-06-17: 300 mg via ORAL
  Filled 2011-06-17: qty 2

## 2011-06-17 MED ORDER — CLINDAMYCIN HCL 150 MG PO CAPS: 300.0000 mg | ORAL_CAPSULE | Freq: Three times a day (TID) | ORAL | Status: AC

## 2011-06-17 MED ORDER — SODIUM CHLORIDE 0.9 % IJ SOLN
10.0000 mL | INTRAMUSCULAR | Status: DC | PRN
  Administered 2011-06-17: 10 mL via INTRAVENOUS

## 2011-06-17 NOTE — ED Provider Notes (Addendum)
History     CSN: [id number] Arrival date & time: 06/17/2011  7:46 AM   First MD Initiated Contact with Patient 06/17/11 [phone]      Chief Complaint  Patient presents with  . Abscess    (Consider location/radiation/quality/duration/timing/severity/associated sxs/prior treatment) Patient is a 55 y.o. male presenting with abscess.  Abscess  This is a recurrent problem.   pt presents to the ED with complaints of abscess to the back of the neck states the abscess has been there for approx a month. Patient denies fevers, chills, weakness. The pain is severe.  Past Medical History  Diagnosis Date  . HTN (hypertension)   . Diverticulitis   . PVD (peripheral vascular disease)   . Chest pain   . Headache   . Light-headedness   . Shortness of breath   . Calf cramp   . Fatigue   . Depression   . Sexual dysfunction     Past Surgical History  Procedure Date  . Femoral bypass     Family History  Problem Relation Age of Onset  . Hypertension Mother   . Diabetes Brother     History  Substance Use Topics  . Smoking status: Current Everyday Smoker  . Smokeless tobacco: Not on file  . Alcohol Use: Yes     occasional      Review of Systems  All other systems reviewed and are negative.    Allergies  Review of patient's allergies indicates no known allergies.  Home Medications   Current Outpatient Rx  Name Route Sig Dispense Refill  . CILOSTAZOL 50 MG PO TABS  2 tabs po bid     . LISINOPRIL 40 MG PO TABS Oral Take 40 mg by mouth daily.      Marland Kitchen ROSUVASTATIN CALCIUM 40 MG PO TABS Oral Take 40 mg by mouth daily.      Marland Kitchen VARENICLINE TARTRATE 0.5 MG X 11 & 1 MG X 42 PO MISC Oral Take by mouth as needed.        There were no vitals taken for this visit.  Physical Exam  Constitutional: He is oriented to person, place, and time. He appears well-developed and well-nourished.  HENT:  Head: Normocephalic and atraumatic.  Right Ear: External ear normal.  Left Ear: External  ear normal.  Nose: Nose normal.  Eyes: Conjunctivae and EOM are normal. Pupils are equal, round, and reactive to light.  Neck: Normal range of motion. Neck supple.  Cardiovascular: Normal rate and regular rhythm.   Pulmonary/Chest: Effort normal and breath sounds normal.  Abdominal: Soft. Bowel sounds are normal.  Musculoskeletal: Normal range of motion.  Neurological: He is alert and oriented to person, place, and time.  Skin: Skin is warm and dry.       ED Course  INCISION AND DRAINAGE Date/Time: 06/17/2011 8:46 AM Performed by: Dorthula Matas Authorized by: Dorthula Matas Consent: Verbal consent obtained. Risks and benefits: risks, benefits and alternatives were discussed Consent given by: patient Patient understanding: patient states understanding of the procedure being performed Patient consent: the patient's understanding of the procedure matches consent given Procedure consent: procedure consent matches procedure scheduled Relevant documents: relevant documents present and verified Test results: test results available and properly labeled Site marked: the operative site was marked Imaging studies: imaging studies available Time out: Immediately prior to procedure a "time out" was called to verify the correct patient, procedure, equipment, support staff and site/side marked as required. Type: cyst Body area: head/neck Anesthesia: local  infiltration Risk factor: underlying major vessel, underlying major nerve and coagulopathy Scalpel size: 10 Needle gauge: 22 Incision type: single straight Complexity: simple Drainage: bloody, serous and purulent Drainage amount: copious Wound treatment: wound left open Comments: Pt cyst very complicated. Therefore ENT doctor consulted for completion of Draining the wound.   (including critical care time)   Labs Reviewed  CBC  DIFFERENTIAL   No results found.     CT Soft Tissue Neck W Contrast (Final result)   Result  time:06/17/11 1033    Final result by Rad Results In Interface (06/17/11 10:33:25)    Narrative:   *RADIOLOGY REPORT*  Clinical Data: Posterior neck abscess  CT NECK WITH CONTRAST  Technique: Multidetector CT imaging of the neck was performed with intravenous contrast.  Contrast: 80mL OMNIPAQUE IOHEXOL 300 MG/ML IV SOLN  Comparison: None.  Findings: 5.3 x 3.4 x 804.5 cm cystic lesion with irregular rim enhancement is identified in the posterior midline neck, at the level of the C2 and C3 spinous processes. This is within the subcutaneous tissues without substantial overlying skin thickening.  Numerous scattered lymph nodes are seen in the neck bilaterally without evidence for cervical lymphadenopathy. The internal jugular veins are patent bilaterally. Common carotid and internal carotid arteries are patent in the neck. Both vertebral arteries are opacified.  6 mm nodule is identified in the posterior left thyroid lobe. Epiglottis and aryepiglottic folds are normal. No evidence for tonsillar or prevertebral swelling/abscess.  Bones are unremarkable. There is some loss of disc height at C5-6, and C6-7.  IMPRESSION: A subcutaneous irregular rim enhancing lesion is identified in the posterior midline of the upper neck, just below the skull base. Imaging features are most suggestive of an abscess. Given the lack of lymphadenopathy, necrotic metastatic lesion is considered less likely.  Original Report Authenticated By: ERIC A. MANSELL, M.D.     No diagnosis found.    MDM  CT scan shows that abscess is superficial and does not extend into the surrounding structures.       Dorthula Matas, PA 06/23/11 0848  Dorthula Matas, PA 06/23/11 [phone]

## 2011-06-17 NOTE — ED Notes (Signed)
Pt returned from ct scan. Pt still with some pain in his neck from abcess. Pt is 7/10

## 2011-06-17 NOTE — ED Notes (Signed)
MD at bedside with pt at this time. Marlon Pel, PA at bedside doing incision and drainage of abscess on back of neck.

## 2011-06-17 NOTE — ED Notes (Signed)
Denies fever.

## 2011-06-17 NOTE — ED Notes (Signed)
Pt being transported to ct scan for scan of his neck with contrast. Pt resting comfortably at present time

## 2011-06-17 NOTE — ED Provider Notes (Signed)
Medical screening examination/treatment/procedure(s) were conducted as a shared visit with non-physician practitioner(s) and myself.  I personally evaluated the patient during the encounter Approximately 5 cm abscess on the posterior neck.  No signs of systemic infection.  Will extend the incision to remove the purulent drainage.  Admission is not indicated at this time.  Nicholes Stairs, MD 06/20/11 1614

## 2011-06-17 NOTE — ED Provider Notes (Signed)
12:38 PM Patient in the CDU at this time. Neva Seat, PA-C attemped I+D, but this was not productive. Call placed to ENT to discuss management.  1:07 PM Talked with Dr. Jearld Fenton. He will assess the pt and attempt drainage.  2:22 PM Wound was successfully drained of a large amount of purulent material (most likely a sebaceous cyst), and was packed. Patient will be given IV clindamycin and d/ced with f/u in office with Dr. Jearld Fenton tomorrow.  Grant Fontana, [place] 06/17/11 1538

## 2011-06-17 NOTE — ED Notes (Signed)
Patient is resting comfortably. Pt is to bathroom and ambulates without difficulty

## 2011-06-17 NOTE — ED Notes (Signed)
Patient is resting comfortably. Pt given call light and oriented to room. Pt instructed to call for any c/os and/or needs.

## 2011-06-18 ENCOUNTER — Other Ambulatory Visit: Payer: Self-pay | Admitting: Internal Medicine

## 2011-06-20 NOTE — ED Provider Notes (Signed)
Medical screening examination/treatment/procedure(s) were performed by non-physician practitioner and as supervising physician I was immediately available for consultation/collaboration.  Nicholes Stairs, MD 06/20/11 [phone]

## 2011-06-23 NOTE — ED Provider Notes (Signed)
Medical screening examination/treatment/procedure(s) were performed by non-physician practitioner and as supervising physician I was immediately available for consultation/collaboration.  Cyndra Numbers, MD 06/23/11 [phone]

## 2011-06-25 NOTE — ED Provider Notes (Signed)
Medical screening examination/treatment/procedure(s) were performed by non-physician practitioner and as supervising physician I was immediately available for consultation/collaboration.  Nicholes Stairs, MD 06/25/11 1511

## 2012-05-14 ENCOUNTER — Other Ambulatory Visit: Payer: Self-pay

## 2012-05-14 DIAGNOSIS — I739 Peripheral vascular disease, unspecified: Secondary | ICD-10-CM

## 2012-05-21 ENCOUNTER — Encounter: Payer: Self-pay | Admitting: Vascular Surgery

## 2012-05-22 ENCOUNTER — Ambulatory Visit (INDEPENDENT_AMBULATORY_CARE_PROVIDER_SITE_OTHER): Payer: Self-pay | Admitting: Vascular Surgery

## 2012-05-22 ENCOUNTER — Encounter (INDEPENDENT_AMBULATORY_CARE_PROVIDER_SITE_OTHER): Payer: Self-pay | Admitting: *Deleted

## 2012-05-22 ENCOUNTER — Encounter: Payer: Self-pay | Admitting: Vascular Surgery

## 2012-05-22 VITALS — BP 139/88 | HR 84 | Ht 72.0 in | Wt 282.0 lb

## 2012-05-22 DIAGNOSIS — I70219 Atherosclerosis of native arteries of extremities with intermittent claudication, unspecified extremity: Secondary | ICD-10-CM | POA: Insufficient documentation

## 2012-05-22 DIAGNOSIS — I739 Peripheral vascular disease, unspecified: Secondary | ICD-10-CM

## 2012-05-22 NOTE — Progress Notes (Signed)
[facility] [place] HISTORY AND PHYSICAL   History of Present Illness:  Patient is a 56 y.o. year old male who presents for evaluation of bilateral lower extremity leg pain.. The patient was last seen in 2011. At that time he had evidence of superficial femoral artery occlusive disease. However I also had some concerns that back problems were related to his symptoms. He states that he did go see someone regarding his back and a and they told him it was not responsible for any of his symptoms. Those records are not available for review today. He has not remember who evaluated him. He eventually had a right superficial femoral artery stent placed by Dr. Sanjuana Kava. The patient states he had no change in his symptoms after this. He currently complains of bilateral calf pain left equal and right after walking approximately 30-40 yards. He states some days are better than others. He does not complain of any back pain. He denies rest pain. He has no healing difficulties. He has quit smoking. Other medical problems include hypertension which is currently stable. The patient currently does not have a primary care physician as [facility]. He states he would like to defer any treatment if possible until he can be covered under the Affordable Care Act.  Past Medical History  Diagnosis Date  . HTN (hypertension)   . Diverticulitis   . PVD (peripheral vascular disease)   . Chest pain   . Headache   . Light-headedness   . Shortness of breath   . Calf cramp   . Fatigue   . Depression   . Sexual dysfunction     Past Surgical History  Procedure Date  . Femoral bypass      Social History History  Substance Use Topics  . Smoking status: Former Smoker    Types: Cigarettes    Start date: 04/16/2012  . Smokeless tobacco: Never Used  . Alcohol Use: Yes     occasional    Family History Family History  Problem Relation Age of Onset  .  Hypertension Mother   . Diabetes Brother     Allergies  No Known Allergies   Current Outpatient Prescriptions  Medication Sig Dispense Refill  . aspirin EC 81 MG tablet Take 81 mg by mouth daily.        . clopidogrel (PLAVIX) 75 MG tablet Take 75 mg by mouth daily.        Marland Kitchen lisinopril (PRINIVIL,ZESTRIL) 40 MG tablet Take 40 mg by mouth daily.        . rosuvastatin (CRESTOR) 20 MG tablet Take 20 mg by mouth daily.      . varenicline (CHANTIX PAK) 0.5 MG X 11 & 1 MG X 42 tablet Take by mouth as needed.          ROS:   General:  No weight loss, Fever, chills  HEENT: No recent headaches, no nasal bleeding, no visual changes, no sore throat  Neurologic: No dizziness, blackouts, seizures. No recent symptoms of stroke or mini- stroke. No recent episodes of slurred speech, or temporary blindness.  Cardiac: No recent episodes of chest pain/pressure, no shortness of breath at rest.  No shortness of breath with exertion.  Denies history of atrial fibrillation or irregular heartbeat  Vascular: No history of rest pain in feet.  No history of non-healing ulcer, No history of DVT   Pulmonary: No home oxygen, no productive cough, no hemoptysis,  No asthma or wheezing  Musculoskeletal:  [ ]  Arthritis, [ ]  Low back pain,  [ ]  Joint pain  Hematologic:No history of hypercoagulable state.  No history of easy bleeding.  No history of anemia  Gastrointestinal: No hematochezia or melena,  No gastroesophageal reflux, no trouble swallowing  Urinary: [ ]  chronic Kidney disease, [ ]  on HD - [ ]  MWF or [ ]  TTHS, [ ]  Burning with urination, [ ]  Frequent urination, [ ]  Difficulty urinating;   Skin: No rashes  Psychological: No history of anxiety,  No history of depression   Physical Examination  Filed Vitals:   05/22/12 1547  BP: 139/88  Pulse: 84  Height: 6' (1.829 m)  Weight: 282 lb (127.914 kg)  SpO2: 98%    Body mass index is 38.25 kg/(m^2).  General:  Alert and oriented, no acute  distress HEENT: Normal Neck: No bruit or JVD Pulmonary: Clear to auscultation bilaterally Cardiac: Regular Rate and Rhythm without murmur Abdomen: Soft, non-tender, non-distended, no mass, obese Skin: No rash Extremity Pulses:  2+ radial, brachial, femoral, absent popliteal dorsalis pedis, posterior tibial pulses bilaterally Musculoskeletal: No deformity or edema  Neurologic: Upper and lower extremity motor 5/5 and symmetric  DATA: The patient had bilateral ABIs performed today. ABI on the right was 0.77 left was 0.66. I reviewed and interpreted this study.   ASSESSMENT: Bilateral lower extremity claudication. However, the patient still may have some component of back problems contributing to his symptoms since his symptoms never changed after his right superficial femoral artery stent. I have offered the patient a bilateral lower extremity arteriogram and possible intervention. However he wishes to put this off until next year when he has some sort of health insurance. I reassured him that as [place] as he does not have diabetes or continue to smoke that his risk of limb loss is less than 5% lifetime. I encouraged and to lose weight. I also encouraged him to refrain from smoking. I also encouraged him to walk 30 minutes daily.   PLAN:  The patient will followup with me in January and hopefully he will have some type of insurance coverage at that time. He'll return sooner if he changes his mind and wishes an intervention sooner.  Fabienne Bruns, MD [facility] Office: [phone] Pager: [phone]

## 2012-06-05 DIAGNOSIS — I70219 Atherosclerosis of native arteries of extremities with intermittent claudication, unspecified extremity: Secondary | ICD-10-CM

## 2012-06-17 ENCOUNTER — Ambulatory Visit: Payer: Self-pay | Admitting: Emergency Medicine

## 2012-06-17 VITALS — BP 100/70 | HR 89 | Temp 99.2°F | Resp 18 | Ht 72.0 in | Wt 280.0 lb

## 2012-06-17 DIAGNOSIS — K5732 Diverticulitis of large intestine without perforation or abscess without bleeding: Secondary | ICD-10-CM

## 2012-06-17 DIAGNOSIS — K5792 Diverticulitis of intestine, part unspecified, without perforation or abscess without bleeding: Secondary | ICD-10-CM

## 2012-06-17 LAB — POCT CBC
Granulocyte percent: 74.9 %G (ref 37–80)
Hemoglobin: 16 g/dL (ref 14.1–18.1)
MID (cbc): 1 — AB (ref 0–0.9)
MPV: 10.7 fL (ref 0–99.8)
POC MID %: 6.7 %M (ref 0–12)
Platelet Count, POC: 282 10*3/uL (ref 142–424)
RBC: 5.45 M/uL (ref 4.69–6.13)
WBC: 14.8 10*3/uL — AB (ref 4.6–10.2)

## 2012-06-17 MED ORDER — CIPROFLOXACIN HCL 500 MG PO TABS: 500.0000 mg | ORAL_TABLET | Freq: Two times a day (BID) | ORAL | Status: DC

## 2012-06-17 MED ORDER — METRONIDAZOLE 500 MG PO TABS: 500.0000 mg | ORAL_TABLET | Freq: Four times a day (QID) | ORAL | Status: DC

## 2012-06-17 NOTE — Progress Notes (Signed)
[facility] [address] [place] [address] [phone]  Date:  06/17/2012   Name:  Todd Weeks   DOB:  04-25-1956   MRN:  [id number]  PCP:  Yisroel Ramming, MD    Chief Complaint: Abdominal Pain   History of Present Illness:  Todd Weeks is a 56 y.o. very pleasant male patient who presents with the following:  History of diverticulitis.  Friend died two weeks ago and he has had difficulty with his diet as a result, eating "all the wrong things" with regards his diverticulitis.  Last night developed lower abdominal pain that is cramping in nature.  Had several loose stools with no blood mucous or pus in the stool.  No black stools.  No dysuria, urgency, or frequency.  No fever but felt chilled.  No nausea or vomiting.  Hungry and eating with no problem.  No increase in pain with bumpy roads, walking, coughing or sneezing.  Pain similar to that he experienced with prior episode of diverticulitis.  Patient Active Problem List  Diagnosis  . TOBACCO ABUSE  . HYPERTENSION, BENIGN ESSENTIAL  . PERIPHERAL VASCULAR DISEASE  . EMBOLISM&THROMBOSIS ARTERIES LOWER EXTREMITY  . SHORTNESS OF BREATH  . PROSTATE SPECIFIC ANTIGEN, ELEVATED  . CHEST PAIN  . Abnormal cardiovascular function study  . CAD (coronary artery disease)  . Atherosclerosis of native arteries of the extremities with intermittent claudication    Past Medical History  Diagnosis Date  . HTN (hypertension)   . Diverticulitis   . PVD (peripheral vascular disease)   . Chest pain   . Headache   . Light-headedness   . Shortness of breath   . Calf cramp   . Fatigue   . Depression   . Sexual dysfunction     Past Surgical History  Procedure Date  . Femoral bypass     History  Substance Use Topics  . Smoking status: Former Smoker    Types: Cigarettes    Start date: 04/16/2012  . Smokeless tobacco: Never Used  . Alcohol Use: Yes     Comment: occasional    Family History  Problem  Relation Age of Onset  . Hypertension Mother   . Diabetes Brother     No Known Allergies  Medication list has been reviewed and updated.  Current Outpatient Prescriptions on File Prior to Visit  Medication Sig Dispense Refill  . aspirin EC 81 MG tablet Take 81 mg by mouth daily.        . clopidogrel (PLAVIX) 75 MG tablet Take 75 mg by mouth daily.        Marland Kitchen lisinopril (PRINIVIL,ZESTRIL) 40 MG tablet Take 40 mg by mouth daily.        . rosuvastatin (CRESTOR) 20 MG tablet Take 20 mg by mouth daily.      . varenicline (CHANTIX PAK) 0.5 MG X 11 & 1 MG X 42 tablet Take by mouth as needed.          Review of Systems:  As per HPI, otherwise negative.    Physical Examination: Filed Vitals:   06/17/12 1837  BP: 100/70  Pulse: 89  Temp: 99.2 F (37.3 C)  Resp: 18   Filed Vitals:   06/17/12 1837  Height: 6' (1.829 m)  Weight: 280 lb (127.007 kg)   Body mass index is 37.97 kg/(m^2). Ideal Body Weight: Weight in (lb) to have BMI = 25: 183.9   GEN: WDWN, NAD, Non-toxic, A & O x  3 HEENT: Atraumatic, Normocephalic. Neck supple. No masses, No LAD. Ears and Nose: No external deformity. CV: RRR, No M/G/R. No JVD. No thrill. No extra heart sounds. PULM: CTA B, no wheezes, crackles, rhonchi. No retractions. No resp. distress. No accessory muscle use. ABD: S, tender in right and left lower abdomen, ND, +BS.  Direct rebound on left lower quadrant, minimally.   No HSM. EXTR: No c/c/e NEURO Normal gait.  PSYCH: Normally interactive. Conversant. Not depressed or anxious appearing.  Calm demeanor.    Assessment and Plan: Acute diverticulitis cipro Flagyl Follow up for new or worsened symptoms; fever chills, increased pain, nausea or vomiting.  Carmelina Dane, MD  Results for orders placed in visit on 06/17/12  POCT CBC      Component Value Range   WBC 14.8 (*) 4.6 - 10.2 K/uL   Lymph, poc 2.7  0.6 - 3.4   POC LYMPH PERCENT 18.4  10 - 50 %L   MID (cbc) 1.0 (*) 0 - 0.9   POC  MID % 6.7  0 - 12 %M   POC Granulocyte 11.1 (*) 2 - 6.9   Granulocyte percent 74.9  37 - 80 %G   RBC 5.45  4.69 - 6.13 M/uL   Hemoglobin 16.0  14.1 - 18.1 g/dL   HCT, POC 16.1  09.6 - 53.7 %   MCV 92.9  80 - 97 fL   MCH, POC 29.4  27 - 31.2 pg   MCHC 31.6 (*) 31.8 - 35.4 g/dL   RDW, POC 04.5     Platelet Count, POC 282  142 - 424 K/uL   MPV 10.7  0 - 99.8 fL

## 2012-08-21 ENCOUNTER — Ambulatory Visit: Payer: Self-pay | Admitting: Vascular Surgery

## 2013-06-02 ENCOUNTER — Encounter (INDEPENDENT_AMBULATORY_CARE_PROVIDER_SITE_OTHER): Payer: Self-pay

## 2013-06-02 ENCOUNTER — Ambulatory Visit (INDEPENDENT_AMBULATORY_CARE_PROVIDER_SITE_OTHER): Payer: Self-pay | Admitting: Urology

## 2013-06-02 DIAGNOSIS — R351 Nocturia: Secondary | ICD-10-CM

## 2013-06-02 DIAGNOSIS — R972 Elevated prostate specific antigen [PSA]: Secondary | ICD-10-CM

## 2013-06-02 DIAGNOSIS — N529 Male erectile dysfunction, unspecified: Secondary | ICD-10-CM

## 2013-08-04 ENCOUNTER — Ambulatory Visit: Payer: Self-pay | Admitting: Urology

## 2013-08-13 HISTORY — PX: PROSTATE BIOPSY: SHX241

## 2013-09-17 ENCOUNTER — Telehealth: Payer: Self-pay | Admitting: Vascular Surgery

## 2013-09-17 NOTE — Telephone Encounter (Signed)
08/19/12: lvm on sister's phone - pt's phone # is [phone] - [phone]  Called pt, ph# has no voicemail, sent discharge letter, 09/17/13 mar

## 2013-09-28 ENCOUNTER — Telehealth: Payer: Self-pay | Admitting: Vascular Surgery

## 2013-09-28 NOTE — Telephone Encounter (Signed)
Pt received discharge letter, pt lm on [facility] answering service that they wanted to resch and will call back, I called them to resch, no answer, no voicemail, 09/28/13 mar

## 2013-11-10 ENCOUNTER — Ambulatory Visit (INDEPENDENT_AMBULATORY_CARE_PROVIDER_SITE_OTHER): Payer: Self-pay | Admitting: Urology

## 2013-11-10 DIAGNOSIS — R972 Elevated prostate specific antigen [PSA]: Secondary | ICD-10-CM

## 2013-11-10 DIAGNOSIS — N400 Enlarged prostate without lower urinary tract symptoms: Secondary | ICD-10-CM

## 2013-11-30 ENCOUNTER — Other Ambulatory Visit: Payer: Self-pay | Admitting: Urology

## 2013-11-30 DIAGNOSIS — R972 Elevated prostate specific antigen [PSA]: Secondary | ICD-10-CM

## 2013-12-22 ENCOUNTER — Other Ambulatory Visit: Payer: Self-pay | Admitting: Urology

## 2013-12-22 ENCOUNTER — Ambulatory Visit (HOSPITAL_COMMUNITY)
Admission: RE | Admit: 2013-12-22 | Discharge: 2013-12-22 | Disposition: A | Discharge status: Home / Self Care | Payer: Self-pay | Source: Ambulatory Visit | Attending: Urology | Admitting: Urology

## 2013-12-22 ENCOUNTER — Encounter (HOSPITAL_COMMUNITY): Payer: Self-pay

## 2013-12-22 DIAGNOSIS — R972 Elevated prostate specific antigen [PSA]: Secondary | ICD-10-CM | POA: Insufficient documentation

## 2013-12-22 MED ORDER — GENTAMICIN SULFATE 40 MG/ML IJ SOLN
160.0000 mg | Freq: Once | INTRAMUSCULAR | Status: AC
  Administered 2013-12-22: 160 mg via INTRAMUSCULAR

## 2013-12-22 MED ORDER — GENTAMICIN SULFATE 40 MG/ML IJ SOLN
INTRAMUSCULAR | Status: AC
  Administered 2013-12-22: 160 mg via INTRAMUSCULAR
  Filled 2013-12-22: qty 4

## 2013-12-22 MED ORDER — LIDOCAINE HCL (PF) 2 % IJ SOLN
10.0000 mL | Freq: Once | INTRAMUSCULAR | Status: AC
  Administered 2013-12-22: 10 mL

## 2013-12-22 MED ORDER — LIDOCAINE HCL (PF) 2 % IJ SOLN
INTRAMUSCULAR | Status: AC
  Administered 2013-12-22: 10 mL
  Filled 2013-12-22: qty 10

## 2013-12-22 NOTE — Discharge Instructions (Signed)
Prostate Biopsy TRUS Biopsy BEFORE THE TEST   Do not take aspirin. Do not take any medicine that has aspirin in it 7 days before your biopsy.  You may be given a medicine to take on the day of your biopsy.  You may also be given a medicine or treatment to help you go poop (laxative or enema). AFTER THE TEST  Only take medicine as told by your doctor.  It is normal to have some bleeding from your rectum for the first 5 days.  You may have blood in your pee (urine) or sperm. Finding out the results of your test Ask when your test results will be ready. Make sure you get your test results. GET HELP RIGHT AWAY IF:  You have a temperature by mouth above 102 F (38.9 C), not controlled by medicine.  You have blood in your pee for more than 5 days.  You have a lot of blood in your pee.  You have bleeding from your rectum for more than 5 days or have a lot of blood in your poop (feces).  You have severe pain. Document Released: 07/18/2009 Document Revised: 10/22/2011 Document Reviewed: 03/18/2013 [facility] Patient Information 2014 [place], [place].

## 2013-12-31 ENCOUNTER — Encounter: Payer: Self-pay | Admitting: Urology

## 2014-05-11 ENCOUNTER — Ambulatory Visit: Payer: Self-pay | Admitting: Urology

## 2014-06-15 ENCOUNTER — Ambulatory Visit: Payer: Self-pay | Admitting: Urology

## 2014-10-19 ENCOUNTER — Ambulatory Visit (INDEPENDENT_AMBULATORY_CARE_PROVIDER_SITE_OTHER): Payer: PPO | Admitting: Family Medicine

## 2014-10-19 VITALS — BP 116/76 | HR 75 | Temp 98.1°F | Resp 20 | Ht 72.5 in | Wt 283.0 lb

## 2014-10-19 DIAGNOSIS — K5732 Diverticulitis of large intestine without perforation or abscess without bleeding: Secondary | ICD-10-CM | POA: Diagnosis not present

## 2014-10-19 DIAGNOSIS — R1032 Left lower quadrant pain: Secondary | ICD-10-CM | POA: Diagnosis not present

## 2014-10-19 DIAGNOSIS — K5730 Diverticulosis of large intestine without perforation or abscess without bleeding: Secondary | ICD-10-CM

## 2014-10-19 LAB — POCT UA - MICROSCOPIC ONLY
Bacteria, U Microscopic: NEGATIVE
CRYSTALS, UR, HPF, POC: NEGATIVE
Casts, Ur, LPF, POC: NEGATIVE
EPITHELIAL CELLS, URINE PER MICROSCOPY: NEGATIVE
MUCUS UA: NEGATIVE
RBC, URINE, MICROSCOPIC: NEGATIVE
Yeast, UA: NEGATIVE

## 2014-10-19 LAB — POCT CBC
GRANULOCYTE PERCENT: 78.2 % (ref 37–80)
HEMATOCRIT: 42.8 % — AB (ref 43.5–53.7)
HEMOGLOBIN: 14.1 g/dL (ref 14.1–18.1)
Lymph, poc: 2.4 (ref 0.6–3.4)
MCH, POC: 29.1 pg (ref 27–31.2)
MCHC: 32.9 g/dL (ref 31.8–35.4)
MCV: 88.6 fL (ref 80–97)
MID (cbc): 0.9 (ref 0–0.9)
MPV: 8.5 fL (ref 0–99.8)
PLATELET COUNT, POC: 228 10*3/uL (ref 142–424)
POC Granulocyte: 12 — AB (ref 2–6.9)
POC LYMPH PERCENT: 15.8 %L (ref 10–50)
POC MID %: 6 % (ref 0–12)
RBC: 4.83 M/uL (ref 4.69–6.13)
RDW, POC: 14.3 %
WBC: 15.3 10*3/uL — AB (ref 4.6–10.2)

## 2014-10-19 LAB — POCT URINALYSIS DIPSTICK
Bilirubin, UA: NEGATIVE
Blood, UA: NEGATIVE
GLUCOSE UA: NEGATIVE
Ketones, UA: NEGATIVE
LEUKOCYTES UA: NEGATIVE
Nitrite, UA: NEGATIVE
Protein, UA: 30
Spec Grav, UA: 1.025
UROBILINOGEN UA: 0.2
pH, UA: 5.5

## 2014-10-19 MED ORDER — CIPROFLOXACIN HCL 500 MG PO TABS: 500.0000 mg | ORAL_TABLET | Freq: Two times a day (BID) | ORAL | Status: DC

## 2014-10-19 MED ORDER — METRONIDAZOLE 500 MG PO TABS: 500.0000 mg | ORAL_TABLET | Freq: Four times a day (QID) | ORAL | Status: DC

## 2014-10-19 NOTE — Progress Notes (Signed)
Subjective: 59 year old male who presents with a history consistent with diverticulitis. He has a history of previously documented diverticulosis and episodes of diverticulitis. 2 times ago he had to be hospitalized. Last time he was treated outpatient here by Dr. Ouida Sills and responded well to Cipro and Flagyl. That was over 2 years ago. He has had a little bit of nausea. Primary symptom is left lower quadrant tenderness. He has had some decreased bowel function. He had chills night before last. Not been documented fever. He is on medications for his blood pressure cholesterol and blood thinner. Is on disability for peripheral vascular disease issues. He was concerned because any peanuts couple weeks ago. He had his last colonoscopy several years ago, he feels confident it was at [facility] although cannot find a record of that.  Objective: Pleasant gentleman in no major distress. He has chest is clear. Heart regular without murmurs. No tachycardia. Abdomen has diminished but present bowel sounds. He has a large abdomen. Soft. No upper abdominal masses or tenderness. Mild tenderness in the right lower quadrant. Significant tenderness of the left lower quadrant along the sigmoid region.  Assessment: Clinically consistent with diverticulitis Abdominal Pain Mild nausea  Plan: CBC and urinalysis  Results for orders placed or performed in visit on 10/19/14  POCT UA - Microscopic Only  Result Value Ref Range   WBC, Ur, HPF, POC 0-2    RBC, urine, microscopic neg    Bacteria, U Microscopic neg    Mucus, UA neg    Epithelial cells, urine per micros neg    Crystals, Ur, HPF, POC neg    Casts, Ur, LPF, POC neg    Yeast, UA neg   POCT CBC  Result Value Ref Range   WBC 15.3 (A) 4.6 - 10.2 K/uL   Lymph, poc 2.4 0.6 - 3.4   POC LYMPH PERCENT 15.8 10 - 50 %L   MID (cbc) 0.9 0 - 0.9   POC MID % 6.0 0 - 12 %M   POC Granulocyte 12.0 (A) 2 - 6.9   Granulocyte percent 78.2 37 - 80 %G   RBC  4.83 4.69 - 6.13 M/uL   Hemoglobin 14.1 14.1 - 18.1 g/dL   HCT, POC 42.8 (A) 43.5 - 53.7 %   MCV 88.6 80 - 97 fL   MCH, POC 29.1 27 - 31.2 pg   MCHC 32.9 31.8 - 35.4 g/dL   RDW, POC 14.3 %   Platelet Count, POC 228 142 - 424 K/uL   MPV 8.5 0 - 99.8 fL  POCT urinalysis dipstick  Result Value Ref Range   Color, UA yellow    Clarity, UA clear    Glucose, UA neg    Bilirubin, UA neg    Ketones, UA neg    Spec Grav, UA 1.025    Blood, UA neg    pH, UA 5.5    Protein, UA 30    Urobilinogen, UA 0.2    Nitrite, UA neg    Leukocytes, UA Negative

## 2014-10-19 NOTE — Patient Instructions (Signed)
Drink plenty of fluids to stay well hydrated.  Metronidazole one 4 times daily. Avoid alcohol because the combination will make your vomit  Ciprofloxacin one twice daily  Tylenol or ibuprofen if needed for fever or pain  Return if any concerns that you are getting worse  Try to check back and see if you can figure out exactly when your last colonoscopy was. Since you have had a couple of attacks, if it is been 5 years or more think you should see a gastroenterologist back after this is calm down. If you need a referral please let me know.   Diverticulitis Diverticulitis is inflammation or infection of small pouches in your colon that form when you have a condition called diverticulosis. The pouches in your colon are called diverticula. Your colon, or large intestine, is where water is absorbed and stool is formed. Complications of diverticulitis can include:  Bleeding.  Severe infection.  Severe pain.  Perforation of your colon.  Obstruction of your colon. CAUSES  Diverticulitis is caused by bacteria. Diverticulitis happens when stool becomes trapped in diverticula. This allows bacteria to grow in the diverticula, which can lead to inflammation and infection. RISK FACTORS People with diverticulosis are at risk for diverticulitis. Eating a diet that does not include enough fiber from fruits and vegetables may make diverticulitis more likely to develop. SYMPTOMS  Symptoms of diverticulitis may include:  Abdominal pain and tenderness. The pain is normally located on the left side of the abdomen, but may occur in other areas.  Fever and chills.  Bloating.  Cramping.  Nausea.  Vomiting.  Constipation.  Diarrhea.  Blood in your stool. DIAGNOSIS  Your health care provider will ask you about your medical history and do a physical exam. You may need to have tests done because many medical conditions can cause the same symptoms as diverticulitis. Tests may include:  Blood  tests.  Urine tests.  Imaging tests of the abdomen, including X-rays and CT scans. When your condition is under control, your health care provider may recommend that you have a colonoscopy. A colonoscopy can show how severe your diverticula are and whether something else is causing your symptoms. TREATMENT  Most cases of diverticulitis are mild and can be treated at home. Treatment may include:  Taking over-the-counter pain medicines.  Following a clear liquid diet.  Taking antibiotic medicines by mouth for 7-10 days. More severe cases may be treated at a hospital. Treatment may include:  Not eating or drinking.  Taking prescription pain medicine.  Receiving antibiotic medicines through an IV tube.  Receiving fluids and nutrition through an IV tube.  Surgery. HOME CARE INSTRUCTIONS   Follow your health care provider's instructions carefully.  Follow a full liquid diet or other diet as directed by your health care provider. After your symptoms improve, your health care provider may tell you to change your diet. He or she may recommend you eat a high-fiber diet. Fruits and vegetables are good sources of fiber. Fiber makes it easier to pass stool.  Take fiber supplements or probiotics as directed by your health care provider.  Only take medicines as directed by your health care provider.  Keep all your follow-up appointments. SEEK MEDICAL CARE IF:   Your pain does not improve.  You have a hard time eating food.  Your bowel movements do not return to normal. SEEK IMMEDIATE MEDICAL CARE IF:   Your pain becomes worse.  Your symptoms do not get better.  Your symptoms suddenly get  worse.  You have a fever.  You have repeated vomiting.  You have bloody or black, tarry stools. MAKE SURE YOU:   Understand these instructions.  Will watch your condition.  Will get help right away if you are not doing well or get worse. Document Released: 05/09/2005 Document Revised:  08/04/2013 Document Reviewed: 06/24/2013 [facility] Patient Information 2015 [place], [place]. This information is not intended to replace advice given to you by your health care provider. Make sure you discuss any questions you have with your health care provider.

## 2014-10-21 ENCOUNTER — Ambulatory Visit (INDEPENDENT_AMBULATORY_CARE_PROVIDER_SITE_OTHER): Payer: PPO | Admitting: Family

## 2014-10-21 ENCOUNTER — Encounter: Payer: Self-pay | Admitting: Family

## 2014-10-21 VITALS — BP 134/84 | HR 80 | Temp 98.3°F | Resp 18 | Ht 72.0 in | Wt 285.0 lb

## 2014-10-21 DIAGNOSIS — I10 Essential (primary) hypertension: Secondary | ICD-10-CM

## 2014-10-21 DIAGNOSIS — R0602 Shortness of breath: Secondary | ICD-10-CM

## 2014-10-21 DIAGNOSIS — I70219 Atherosclerosis of native arteries of extremities with intermittent claudication, unspecified extremity: Secondary | ICD-10-CM

## 2014-10-21 NOTE — Progress Notes (Signed)
Pre visit review using our clinic review tool, if applicable. No additional management support is needed unless otherwise documented below in the visit note. 

## 2014-10-21 NOTE — Progress Notes (Signed)
Subjective:    Patient ID: Todd Weeks, male    DOB: 27-Sep-1955, 59 y.o.   MRN: [id number]  Chief Complaint  Patient presents with  . Establish Care    would like to go over some health issues he has     HPI:  Todd Weeks is a 59 y.o. male who presents today to establish care and discuss some health issues.  1) Venous insufficiency / blood flow in legs - This is a chronic problem that has gotten progressively worse over the past 10 years. Has been working with vascular surgery. They encouraged smoking cessation and weight loss. Currently limited in physical activity secondary to the shortness of breath and pain. Pain varies with activity and intensity can be as much as 10/10.   2) Breathlessness - This has been going on for several years. Associated symptom is getting short of breath with activity. Intensity can be strong enough that he has to pause while brushing his teeth. Has not been tested for COPD.  3) Hypertension - stable with current regimen.  BP Readings from Last 3 Encounters:  10/21/14 134/84  10/19/14 116/76  12/22/13 152/86    No Known Allergies   Current Outpatient Prescriptions on File Prior to Visit  Medication Sig Dispense Refill  . ciprofloxacin (CIPRO) 500 MG tablet Take 1 tablet (500 mg total) by mouth 2 (two) times daily. 20 tablet 0  . clopidogrel (PLAVIX) 75 MG tablet Take 75 mg by mouth daily.      Marland Kitchen lisinopril (PRINIVIL,ZESTRIL) 40 MG tablet Take 40 mg by mouth daily.      . metroNIDAZOLE (FLAGYL) 500 MG tablet Take 1 tablet (500 mg total) by mouth 4 (four) times daily. 40 tablet 0  . simvastatin (ZOCOR) 20 MG tablet Take 20 mg by mouth daily.     No current facility-administered medications on file prior to visit.    Past Medical History  Diagnosis Date  . HTN (hypertension)   . Diverticulitis   . PVD (peripheral vascular disease)   . Chest pain   . Headache(784.0)   . Light-headedness   . Shortness of breath   . Calf cramp   . Fatigue     . Depression   . Sexual dysfunction     Past Surgical History  Procedure Laterality Date  . Femoral bypass      Family History  Problem Relation Age of Onset  . Hypertension Mother   . Diabetes Brother   . Deep vein thrombosis Father   . Tuberculosis Maternal Grandfather     History   Social History  . Marital Status: Single    Spouse Name: N/A  . Number of Children: 2  . Years of Education: 12   Occupational History  . 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 Unemployed     disability   Social History Main Topics  . Smoking status: Former Smoker -- 1.50 packs/day for 46 years    Types: Cigarettes    Start date: 04/16/2012    Quit date: 07/13/2013  . Smokeless tobacco: Never Used  . Alcohol Use: 0.6 oz/week    1 Cans of beer per week     Comment: occasional  . Drug Use: Yes    Special: Cocaine, Marijuana, "Crack" cocaine     Comment: not since 1999  . Sexual Activity: Not on file   Other Topics Concern  . Not on file   Social History Narrative   Raised in [place], [place]. Does not  have any religious beliefs that would effect healthcare. Lives in house with sister. Likes to ride motorcycle for fun.     Review of Systems  Constitutional: Negative for fever and chills.  Respiratory: Positive for cough and shortness of breath. Negative for chest tightness.   Cardiovascular: Negative for chest pain, palpitations and leg swelling.  Neurological: Positive for headaches.      Objective:    BP 134/84 mmHg  Pulse 80  Temp(Src) 98.3 F (36.8 C) (Oral)  Resp 18  Ht 6' (1.829 m)  Wt 285 lb (129.275 kg)  BMI 38.64 kg/m2  SpO2 98% Nursing note and vital signs reviewed.  Physical Exam  Constitutional: He is oriented to person, place, and time. He appears well-developed and well-nourished. No distress.  Cardiovascular: Normal rate, regular rhythm, normal heart sounds and intact distal pulses.   Varicose veins noted in lower extremities bilaterally.  Pulmonary/Chest: Effort  normal and breath sounds normal.  Neurological: He is alert and oriented to person, place, and time.  Skin: Skin is warm and dry.  Psychiatric: He has a normal mood and affect. His behavior is normal. Judgment and thought content normal.       Assessment & Plan:

## 2014-10-21 NOTE — Assessment & Plan Note (Signed)
Stable with current regimen and below goal. Continue current dosage of lisinopril.

## 2014-10-21 NOTE — Assessment & Plan Note (Signed)
Multifactorial in nature. Cannot rule out COPD. Refer to pulmonology for PFTs. Patient declines medication at this time. Will await results of PFT to determine need for medication. He is being followed by cardiology and cannot rule out cardiology as a potential source of shortness of breath. Continue increasing physical activity as tolerated and improving nutrient density of his diet to assist with weight loss which may additionally help with shortness of breath.

## 2014-10-21 NOTE — Assessment & Plan Note (Signed)
Currently stable and managed by Dr. Oneida Alar with the recommendation of losing weight and tobacco cessation. Patient has successfully stopped smoking and is in the processes of losing weight. Discussed increasing nutrient density and increasing physical activity as tolerated, which is currently limited by his shortness of breath.

## 2014-10-21 NOTE — Patient Instructions (Addendum)
Thank you for choosing [facility].  Summary/Instructions:  Please schedule a time for your schedule.  If your symptoms worsen or fail to improve, please contact our office for further instruction, or in case of emergency go directly to the emergency room at the closest medical facility.   For weight loss - try MyFitnessPal  Calorie Counting for Weight Loss Calories are energy you get from the things you eat and drink. Your body uses this energy to keep you going throughout the day. The number of calories you eat affects your weight. When you eat more calories than your body needs, your body stores the extra calories as fat. When you eat fewer calories than your body needs, your body burns fat to get the energy it needs. Calorie counting means keeping track of how many calories you eat and drink each day. If you make sure to eat fewer calories than your body needs, you should lose weight. In order for calorie counting to work, you will need to eat the number of calories that are right for you in a day to lose a healthy amount of weight per week. A healthy amount of weight to lose per week is usually 1-2 lb (0.5-0.9 kg). A dietitian can determine how many calories you need in a day and give you suggestions on how to reach your calorie goal.  WHAT IS MY MY PLAN? My goal is to have __________ calories per day.  If I have this many calories per day, I should lose around __________ pounds per week. WHAT DO I NEED TO KNOW ABOUT CALORIE COUNTING? In order to meet your daily calorie goal, you will need to:  Find out how many calories are in each food you would like to eat. Try to do this before you eat.  Decide how much of the food you can eat.  Write down what you ate and how many calories it had. Doing this is called keeping a food log. WHERE DO I FIND CALORIE INFORMATION? The number of calories in a food can be found on a Nutrition Facts label. Note that all the information on a label is  based on a specific serving of the food. If a food does not have a Nutrition Facts label, try to look up the calories online or ask your dietitian for help. HOW DO I DECIDE HOW MUCH TO EAT? To decide how much of the food you can eat, you will need to consider both the number of calories in one serving and the size of one serving. This information can be found on the Nutrition Facts label. If a food does not have a Nutrition Facts label, look up the information online or ask your dietitian for help. Remember that calories are listed per serving. If you choose to have more than one serving of a food, you will have to multiply the calories per serving by the amount of servings you plan to eat. For example, the label on a package of bread might say that a serving size is 1 slice and that there are 90 calories in a serving. If you eat 1 slice, you will have eaten 90 calories. If you eat 2 slices, you will have eaten 180 calories. HOW DO I KEEP A FOOD LOG? After each meal, record the following information in your food log:  What you ate.  How much of it you ate.  How many calories it had.  Then, add up your calories. Keep your food log near  you, such as in a small notebook in your pocket. Another option is to use a mobile app or website. Some programs will calculate calories for you and show you how many calories you have left each time you add an item to the log. WHAT ARE SOME CALORIE COUNTING TIPS?  Use your calories on foods and drinks that will fill you up and not leave you hungry. Some examples of this include foods like nuts and nut butters, vegetables, lean proteins, and high-fiber foods (more than 5 g fiber per serving).  Eat nutritious foods and avoid empty calories. Empty calories are calories you get from foods or beverages that do not have many nutrients, such as candy and soda. It is better to have a nutritious high-calorie food (such as an avocado) than a food with few nutrients (such as a  bag of chips).  Know how many calories are in the foods you eat most often. This way, you do not have to look up how many calories they have each time you eat them.  Look out for foods that may seem like low-calorie foods but are really high-calorie foods, such as baked goods, soda, and fat-free candy.  Pay attention to calories in drinks. Drinks such as sodas, specialty coffee drinks, alcohol, and juices have a lot of calories yet do not fill you up. Choose low-calorie drinks like water and diet drinks.  Focus your calorie counting efforts on higher calorie items. Logging the calories in a garden salad that contains only vegetables is less important than calculating the calories in a milk shake.  Find a way of tracking calories that works for you. Get creative. Most people who are successful find ways to keep track of how much they eat in a day, even if they do not count every calorie. WHAT ARE SOME PORTION CONTROL TIPS?  Know how many calories are in a serving. This will help you know how many servings of a certain food you can have.  Use a measuring cup to measure serving sizes. This is helpful when you start out. With time, you will be able to estimate serving sizes for some foods.  Take some time to put servings of different foods on your favorite plates, bowls, and cups so you know what a serving looks like.  Try not to eat straight from a bag or box. Doing this can lead to overeating. Put the amount you would like to eat in a cup or on a plate to make sure you are eating the right portion.  Use smaller plates, glasses, and bowls to prevent overeating. This is a quick and easy way to practice portion control. If your plate is smaller, less food can fit on it.  Try not to multitask while eating, such as watching TV or using your computer. If it is time to eat, sit down at a table and enjoy your food. Doing this will help you to start recognizing when you are full. It will also make you  more aware of what and how much you are eating. HOW CAN I CALORIE COUNT WHEN EATING OUT?  Ask for smaller portion sizes or child-sized portions.  Consider sharing an entree and sides instead of getting your own entree.  If you get your own entree, eat only half. Ask for a box at the beginning of your meal and put the rest of your entree in it so you are not tempted to eat it.  Look for the calories on the  menu. If calories are listed, choose the lower calorie options.  Choose dishes that include vegetables, fruits, whole grains, low-fat dairy products, and lean protein. Focusing on smart food choices from each of the 5 food groups can help you stay on track at restaurants.  Choose items that are boiled, broiled, grilled, or steamed.  Choose water, milk, unsweetened iced tea, or other drinks without added sugars. If you want an alcoholic beverage, choose a lower calorie option. For example, a regular margarita can have up to 700 calories and a glass of wine has around 150.  Stay away from items that are buttered, battered, fried, or served with cream sauce. Items labeled "crispy" are usually fried, unless stated otherwise.  Ask for dressings, sauces, and syrups on the side. These are usually very high in calories, so do not eat much of them.  Watch out for salads. Many people think salads are a healthy option, but this is often not the case. Many salads come with bacon, fried chicken, lots of cheese, fried chips, and dressing. All of these items have a lot of calories. If you want a salad, choose a garden salad and ask for grilled meats or steak. Ask for the dressing on the side, or ask for olive oil and vinegar or lemon to use as dressing.  Estimate how many servings of a food you are given. For example, a serving of cooked rice is  cup or about the size of half a tennis ball or one cupcake wrapper. Knowing serving sizes will help you be aware of how much food you are eating at restaurants.  The list below tells you how big or small some common portion sizes are based on everyday objects.  1 oz--4 stacked dice.  3 oz--1 deck of cards.  1 tsp--1 dice.  1 Tbsp-- a Ping-Pong ball.  2 Tbsp--1 Ping-Pong ball.   cup--1 tennis ball or 1 cupcake wrapper.  1 cup--1 baseball. Document Released: 07/30/2005 Document Revised: 12/14/2013 Document Reviewed: 06/04/2013 [facility] Patient Information 2015 [place], [place]. This information is not intended to replace advice given to you by your health care provider. Make sure you discuss any questions you have with your health care provider.  Exercise to Lose Weight Exercise and a healthy diet may help you lose weight. Your doctor may suggest specific exercises. EXERCISE IDEAS AND TIPS  Choose low-cost things you enjoy doing, such as walking, bicycling, or exercising to workout videos.  Take stairs instead of the elevator.  Walk during your lunch break.  Park your car further away from work or school.  Go to a gym or an exercise class.  Start with 5 to 10 minutes of exercise each day. Build up to 30 minutes of exercise 4 to 6 days a week.  Wear shoes with good support and comfortable clothes.  Stretch before and after working out.  Work out until you breathe harder and your heart beats faster.  Drink extra water when you exercise.  Do not do so much that you hurt yourself, feel dizzy, or get very short of breath. Exercises that burn about 150 calories:  Running 1  miles in 15 minutes.  Playing volleyball for 45 to 60 minutes.  Washing and waxing a car for 45 to 60 minutes.  Playing touch football for 45 minutes.  Walking 1  miles in 35 minutes.  Pushing a stroller 1  miles in 30 minutes.  Playing basketball for 30 minutes.  Raking leaves for 30 minutes.  Bicycling  5 miles in 30 minutes.  Walking 2 miles in 30 minutes.  Dancing for 30 minutes.  Shoveling snow for 15 minutes.  Swimming laps for 20  minutes.  Walking up stairs for 15 minutes.  Bicycling 4 miles in 15 minutes.  Gardening for 30 to 45 minutes.  Jumping rope for 15 minutes.  Washing windows or floors for 45 to 60 minutes. Document Released: 09/01/2010 Document Revised: 10/22/2011 Document Reviewed: 09/01/2010 [facility] Patient Information 2015 [place], [place]. This information is not intended to replace advice given to you by your health care provider. Make sure you discuss any questions you have with your health care provider.

## 2014-10-22 ENCOUNTER — Other Ambulatory Visit: Payer: Self-pay | Admitting: Family

## 2014-10-22 ENCOUNTER — Telehealth: Payer: Self-pay | Admitting: Family

## 2014-10-22 DIAGNOSIS — R0602 Shortness of breath: Secondary | ICD-10-CM

## 2014-10-22 NOTE — Telephone Encounter (Signed)
emmi emailed °

## 2014-10-28 ENCOUNTER — Ambulatory Visit (INDEPENDENT_AMBULATORY_CARE_PROVIDER_SITE_OTHER): Payer: PPO | Admitting: Internal Medicine

## 2014-10-28 DIAGNOSIS — R0602 Shortness of breath: Secondary | ICD-10-CM | POA: Diagnosis not present

## 2014-10-28 LAB — PULMONARY FUNCTION TEST
DL/VA % pred: 95 %
DL/VA: 4.56 ml/min/mmHg/L
DLCO UNC % PRED: 84 %
DLCO unc: 30.26 ml/min/mmHg
FEF 25-75 Post: 2.46 L/sec
FEF 25-75 Pre: 2.27 L/sec
FEF2575-%CHANGE-POST: 8 %
FEF2575-%Pred-Post: 76 %
FEF2575-%Pred-Pre: 70 %
FEV1-%Change-Post: 3 %
FEV1-%Pred-Post: 88 %
FEV1-%Pred-Pre: 85 %
FEV1-Post: 3.47 L
FEV1-Pre: 3.35 L
FEV1FVC-%Change-Post: 2 %
FEV1FVC-%Pred-Pre: 95 %
FEV6-%Change-Post: 1 %
FEV6-%PRED-POST: 93 %
FEV6-%Pred-Pre: 91 %
FEV6-POST: 4.63 L
FEV6-PRE: 4.56 L
FEV6FVC-%Change-Post: 0 %
FEV6FVC-%PRED-POST: 103 %
FEV6FVC-%PRED-PRE: 103 %
FVC-%Change-Post: 0 %
FVC-%Pred-Post: 89 %
FVC-%Pred-Pre: 88 %
FVC-Post: 4.66 L
FVC-Pre: 4.61 L
PRE FEV1/FVC RATIO: 73 %
PRE FEV6/FVC RATIO: 99 %
Post FEV1/FVC ratio: 74 %
Post FEV6/FVC ratio: 99 %
RV % pred: 122 %
RV: 2.92 L
TLC % PRED: 103 %
TLC: 7.78 L

## 2014-10-28 NOTE — Progress Notes (Signed)
PFT done today. 

## 2014-10-29 ENCOUNTER — Telehealth: Payer: Self-pay | Admitting: Family

## 2014-10-29 DIAGNOSIS — R0602 Shortness of breath: Secondary | ICD-10-CM

## 2014-10-29 NOTE — Telephone Encounter (Signed)
Please inform the patient that his lung function tests showed minimal obstructive airways. Therefore no evidence suggestive of COPD.

## 2014-11-01 NOTE — Telephone Encounter (Signed)
Referral to pulmonology made.

## 2014-11-01 NOTE — Telephone Encounter (Signed)
LVM for pt to call back.

## 2014-11-01 NOTE — Telephone Encounter (Signed)
Patient is returning your call.  

## 2014-11-01 NOTE — Telephone Encounter (Signed)
Patient returned your call again.

## 2014-11-01 NOTE — Telephone Encounter (Signed)
Called pt back. Pt is aware of results. Still having the same symptoms. SOB and states he literally has to stop while brushing his teeth due to SOB. I stated you may refer him to a pulmonologist. Please advise

## 2014-11-03 NOTE — Telephone Encounter (Signed)
Pt aware.

## 2014-11-15 ENCOUNTER — Telehealth: Payer: Self-pay | Admitting: Family

## 2014-11-15 MED ORDER — SIMVASTATIN 20 MG PO TABS: 20.0000 mg | ORAL_TABLET | Freq: Every day | ORAL | Status: DC

## 2014-11-15 NOTE — Telephone Encounter (Signed)
Refill sent.

## 2014-11-15 NOTE — Telephone Encounter (Signed)
Pt called in needing refill on simvastatin (ZOCOR) 20 MG tablet [[id number]]  It goes to pharmacy on file.

## 2014-11-22 ENCOUNTER — Institutional Professional Consult (permissible substitution): Payer: PPO | Admitting: Internal Medicine

## 2014-12-08 ENCOUNTER — Telehealth: Payer: Self-pay

## 2014-12-08 NOTE — Telephone Encounter (Signed)
AWV scheduled for 12/13/14 at 8am

## 2014-12-13 ENCOUNTER — Telehealth: Payer: Self-pay

## 2014-12-13 ENCOUNTER — Ambulatory Visit (INDEPENDENT_AMBULATORY_CARE_PROVIDER_SITE_OTHER): Payer: PPO

## 2014-12-13 VITALS — BP 116/80 | Ht 72.5 in | Wt 280.2 lb

## 2014-12-13 DIAGNOSIS — Z0000 Encounter for general adult medical examination without abnormal findings: Secondary | ICD-10-CM

## 2014-12-13 DIAGNOSIS — Z23 Encounter for immunization: Secondary | ICD-10-CM

## 2014-12-13 NOTE — Telephone Encounter (Signed)
The patient stated he has had labs q 3 months at the [facility]; number [phone] / Under Soyla Dryer PA  Call placed and LVM to request most recent labs; lipids; A1c etc be faxed to [facility] and fax number given.

## 2014-12-13 NOTE — Telephone Encounter (Signed)
[facility] back from [facility] in [place] and stated that PA has requested a release of information from the patient. The patient does not have mychart so will call: Call to the patient and stated he would call the [facility] and give them a release for labs; etc to be forwarded to [facility];  Also noted that Mychart was not "activated" and requested he go back in and "activate" and could send a message as well as his fup apt with pulmonary.

## 2014-12-13 NOTE — Progress Notes (Signed)
Subjective:   Todd Weeks is a 59 y.o. male who presents for Medicare Annual/Subsequent preventive examination.  Review of Systems:  HRA assessment completed during visit  Health better, the same or worse than last year? About the same  Health described as excellent; very good; good; fair or poor? Very poor Because SOB; weight; problems with legs  Current Exercise: considers exercise to be walking, biking and "i get nothing". Not sedentary; up and about doing yard work Sleep a lot Sleep at hs; 6. 5 hours;  C/o of some reflux;   Current dietary: eggs; milk; toast; meat; V8; carrot juice;  Food; eat fast food; chef by history  Psychosocial changes in the last year; moves; losses of family;   Vision: last vision check last year; over a year  Dental: been awhile  Generalized Safety in the home reviewed; driving ok Numbness in hand; Memory loss;   Review Firearm safety as appropriate;  Assessed for community safety Emergency Plan for illness or other Driving       Current Care Team reviewed and updated        Objective:    Vitals: Ht 6' 0.5" (1.842 m)  Wt 280 lb 4 oz (127.121 kg)  BMI 37.47 kg/m2  Tobacco History  Smoking status  . Former Smoker -- 1.50 packs/day for 46 years  . Types: Cigarettes  . Start date: 04/16/2012  . Quit date: 07/13/2013  Smokeless tobacco  . Never Used     Counseling given: Not Answered   Past Medical History  Diagnosis Date  . HTN (hypertension)   . Diverticulitis   . PVD (peripheral vascular disease)   . Chest pain   . Headache(784.0)   . Light-headedness   . Shortness of breath   . Calf cramp   . Fatigue   . Depression   . Sexual dysfunction    Past Surgical History  Procedure Laterality Date  . Femoral bypass     Family History  Problem Relation Age of Onset  . Hypertension Mother   . Diabetes Brother   . Deep vein thrombosis Father   . Tuberculosis Maternal Grandfather    History  Sexual  Activity  . Sexual Activity: Not on file    Outpatient Encounter Prescriptions as of 12/13/2014  . Order #: [id number] Class: Normal  . Order #: [id number] Class: Historical Med  . Order #: [id number] Class: Historical Med  . Order #: [id number] Class: Normal  . Order #: [id number] Class: Normal    Activities of Daily Living In your present state of health, do you have any difficulty performing the following activities: 10/21/2014  Hearing? Y  Vision? Y  Difficulty concentrating or making decisions? Y  Walking or climbing stairs? N  Dressing or bathing? N  Doing errands, shopping? N    Patient Care Team: Golden Circle, FNP as PCP - General (Family Medicine)   Assessment:    CC risk: obesity (BMI); family hx; diabetes; HTN; hyperlipidemia; Sedentary; HTN;   Personalized Education given regarding: Pt determined a personalized goal   Assessment included smoking hx; stopped 2 years in May  Fall Risk and general Safety reviewed Assess fear of falling? No; but does have lack of feeling in legs  Regular vision checks ; due this year Home safety; removal of throw rugs; bathroom handrails;  Smoke detectors; safe environment    Stress; getting ready to move back to his home   Risk for hepatitis or high risk social behavior:  screened and identified no issues  Risk for Depression: since hie has been disabled; wants to work  Risk for Falls: Not at this time   Safety assessed (driving issues; vision; home; environment; support)  Cognition assessed by AD8; Score 0 (A score of 2 or greater would indicate the MMSE be completed)    Need for Immunizations or other screenings identified; Tdap  Not due pneumonia (CDC recommmend Prevnar at 42 followed by pnuemovax 23 in one year or 5 years after the last dose.  Health Maintenance up to date and a Preventive Wellness Plan was given to the patient  Exercise Activities and Dietary recommendations    1. Reduce sodium and ?  myfitnesspal 2. Take care of his health; apts fup etc  Goals    None     Fall Risk No flowsheet data found. Depression Screen No flowsheet data found.  Cognitive Testing No flowsheet data found.  There is no immunization history for the selected administration types on file for this patient. Screening Tests Health Maintenance  Topic Date Due  . TETANUS/TDAP  09/18/1973  . COLONOSCOPY  09/18/2004  . ZOSTAVAX  09/18/2014  . INFLUENZA VACCINE  03/14/2015  . HIV Screening  Completed      Plan:  Plan   The patient agrees to: Watch sodium fup with apt for  Lung doctor and eye apt Had colonoscopy x 3 to 59 yo; dx diverticulitis Tdap given today    Advanced directive:Given infor on AD  Discuss with Doctor on next fup: I will fup and request labs done at the [facility] clinic in [place] to Goals set;   During the course of the visit the patient was educated and counseled about the following appropriate screening and preventive services:   Vaccines to include Pneumoccal, Influenza, Hepatitis B, Td, Zostavax, HCV  Electrocardiogram- cardiology reviewed and no issues  Cardiovascular Disease; none but does peripheral vascular disease  Colorectal cancer screening ; completed x  3 to 60 yo; no sure where records are  Diabetes screening; n/a  Prostate Cancer Screening; bx last year  Glaucoma screening; To be done this this year  Nutrition counseling ; Dash diet  Smoking cessation counseling; Discussed LDCT for lung cancer but will fup with the pulmonologist   Patient Instructions (the written plan) was given to the patient.    WTUUE,KCMKL, RN  12/13/2014   I have reviewed the information and agree with the plan.   Terri Piedra, FNP

## 2014-12-13 NOTE — Patient Instructions (Addendum)
Mr. Todd Weeks , Thank you for taking time to come for your Medicare Wellness Visit. I appreciate your ongoing commitment to your health goals. Please review the following plan we discussed and let me know if I can assist you in the future.   These are the goals we discussed: Goals    . Weight < 200 lb (90.719 kg)     Wants to work on sodium; Cut out the salt Drink more water [employer] educated on Feels if he loses weight, it would solve a lot of problems Weight watchers; [email or web address] Don't look at  [employer]; order based on good food;        2. fup on health apt. Pulmonologist and eye doctor  This is a list of the screening recommended for you and due dates:  Health Maintenance  Topic Date Due  . Tetanus Vaccine  09/18/1973  . Colon Cancer Screening  09/18/2004  . Shingles Vaccine  09/18/2014  . Flu Shot  03/14/2015  . HIV Screening  Completed   Will take the Tdap today Colonoscopy completed Shingles educated where to get tha t Hepatitis screening neg Sleep Apnea  Sleep apnea is a sleep disorder characterized by abnormal pauses in breathing while you sleep. When your breathing pauses, the level of oxygen in your blood decreases. This causes you to move out of deep sleep and into light sleep. As a result, your quality of sleep is poor, and the system that carries your blood throughout your body (cardiovascular system) experiences stress. If sleep apnea remains untreated, the following conditions can develop:  High blood pressure (hypertension).  Coronary artery disease.  Inability to achieve or maintain an erection (impotence).  Impairment of your thought process (cognitive dysfunction). There are three types of sleep apnea: 1. Obstructive sleep apnea--Pauses in breathing during sleep because of a blocked airway. 2. Central sleep apnea--Pauses in breathing during sleep because the area of the brain that controls your breathing does not send the correct signals to the muscles  that control breathing. 3. Mixed sleep apnea--A combination of both obstructive and central sleep apnea. RISK FACTORS The following risk factors can increase your risk of developing sleep apnea:  Being overweight.  Smoking.  Having narrow passages in your nose and throat.  Being of older age.  Being male.  Alcohol use.  Sedative and tranquilizer use.  Ethnicity. Among individuals younger than 35 years, African Americans are at increased risk of sleep apnea. SYMPTOMS   Difficulty staying asleep.  Daytime sleepiness and fatigue.  Loss of energy.  Irritability.  Loud, heavy snoring.  Morning headaches.  Trouble concentrating.  Forgetfulness.  Decreased interest in sex. DIAGNOSIS  In order to diagnose sleep apnea, your caregiver will perform a physical examination. Your caregiver may suggest that you take a home sleep test. Your caregiver may also recommend that you spend the night in a sleep lab. In the sleep lab, several monitors record information about your heart, lungs, and brain while you sleep. Your leg and arm movements and blood oxygen level are also recorded. TREATMENT The following actions may help to resolve mild sleep apnea:  Sleeping on your side.   Using a decongestant if you have nasal congestion.   Avoiding the use of depressants, including alcohol, sedatives, and narcotics.   Losing weight and modifying your diet if you are overweight. There also are devices and treatments to help open your airway:  Oral appliances. These are custom-made mouthpieces that shift your lower jaw forward and slightly open  your bite. This opens your airway.  Devices that create positive airway pressure. This positive pressure "splints" your airway open to help you breathe better during sleep. The following devices create positive airway pressure:  Continuous positive airway pressure (CPAP) device. The CPAP device creates a continuous level of air pressure with an  air pump. The air is delivered to your airway through a mask while you sleep. This continuous pressure keeps your airway open.  Nasal expiratory positive airway pressure (EPAP) device. The EPAP device creates positive air pressure as you exhale. The device consists of single-use valves, which are inserted into each nostril and held in place by adhesive. The valves create very little resistance when you inhale but create much more resistance when you exhale. That increased resistance creates the positive airway pressure. This positive pressure while you exhale keeps your airway open, making it easier to breath when you inhale again.  Bilevel positive airway pressure (BPAP) device. The BPAP device is used mainly in patients with central sleep apnea. This device is similar to the CPAP device because it also uses an air pump to deliver continuous air pressure through a mask. However, with the BPAP machine, the pressure is set at two different levels. The pressure when you exhale is lower than the pressure when you inhale.  Surgery. Typically, surgery is only done if you cannot comply with less invasive treatments or if the less invasive treatments do not improve your condition. Surgery involves removing excess tissue in your airway to create a wider passage way. Document Released: 07/20/2002 Document Revised: 11/24/2012 Document Reviewed: 12/06/2011 [facility] Patient Information 2015 [place], [place]. This information is not intended to replace advice given to you by your health care provider. Make sure you discuss any questions you have with your health care provider. Cooking with Less Salt Cooking with less salt is one way to reduce the amount of sodium you get from food. Sodium raises blood pressure and causes water to be held in the body. Getting less sodium from food may help lower your blood pressure, reduce any swelling, and protect your heart, liver, and kidneys.  WHAT DO I NEED TO KNOW ABOUT COOKING  WITH LESS SALT?  Buy sodium-free or low-sodium products. Look on the label for the words:   Lower-sodium.  Sodium-free.   Sodium-reduced.   No salt added.   Unsalted.  Check the food label before using or buying packaged ingredients.  Look for products with no more than 150 mg of sodium in one serving.  Do not choose foods with salt as one of the first three ingredients on the ingredients list. If salt is one of the first three ingredients, it usually means the item is high in sodium because ingredients are listed in order of amount in the food item.  Use herbs, seasonings without salt, and spices as substitutes for salt in foods.  Use sodium-free baking soda when baking. WHAT ARE SOME SALT ALTERNATIVES? The following are herbs, seasonings, and spices that can be used instead of salt to give taste to your food. Next to their names are foods they can be used to flavor.  Herbs 4. Bay-Soups, meat and vegetable dishes, and spaghetti sauce.  5. Basil-Italian dishes, soups, pasta, and fish dishes.  6. Cilantro-Meat, poultry, and vegetable dishes.  7. Chili Powder-Marinades and Mexican dishes.  8. Chives-Salad dressings and potato dishes.  9. Cumin-Mexican dishes, couscous, and meat dishes.  10. Dill-Fish dishes, sauces, and salads.  11. Fennel-Meat and vegetable dishes, breads,  and cookies.  12. Garlic (do not use garlic salt)-Italian dishes, meat dishes, salad dressings, and sauces.  13. Marjoram-Soups, potato dishes, and meat dishes.  14. Oregano-Pizza and spaghetti sauce.  15. Parsley-Salads, soups, pasta, and meat dishes.  16. Rosemary-Italian dishes, salad dressings, soups, and red meats.  17. Saffron-Fish dishes, pasta, and some poultry dishes.  18. Sage-Stuffings and sauces.  19. Tarragon-Fish and poultry dishes.  20. Thyme-Stuffing, meat, and fish dishes.  Herbs should be fresh or dried. Do not choose packaged mixes.  Seasonings  Lemon  juice-Fish dishes, poultry dishes, vegetables, and salads.   Vinegar-Salad dressings, vegetables, and fish dishes.  Spices  Cinnamon-Sweet dishes (such as cakes, cookies, and puddings).   Cloves-Gingerbread, puddings, and marinades for meats.   Curry-Vegetable dishes, fish and poultry dishes, and stir-fry dishes.   Ginger-Vegetables dishes, fish dishes, and stir-fry dishes.   Nutmeg-Pasta, vegetables, poultry, fish dishes, and custard.  WHAT ARE SOME LOW-SODIUM INGREDIENTS AND FOODS?  Fresh or frozen fruits and vegetables with no sauce added.  Fresh or frozen whole meats, poultry, and fish with no sauce added.  Eggs.  Noodles, pasta, quinoa, rice.  Shredded or puffed wheat or puffed rice.  Regular or quick oats.  Milk, yogurt, low-sodium cheeses and hard cheeses (such as cheddar, [employer], or mozzarella). Always check the label for serving size and sodium content.  Unsalted butter or margarine.  Unsalted nuts.  Sherbet or ice cream (keep to  cup serving).  Homemade pudding.  Sodium-free baking soda and baking powder. This is not a complete list of low-sodium ingredients and foods. Contact your dietitian for more options.  WHAT HIGH-SODIUM INGREDIENTS ARE NOT RECOMMENDED?  Sauces, such as mustard, barbecue sauce, soy sauce, teriyaki sauce, steak sauce, chili sauce, cocktail sauce, and tartar sauce.  Mixes, such as flavored rice.  Instant products, such as ready-made pasta.  Horseradish.  Salsa.   Packaged gravies.   Angie Fava.  Olives.  Sauerkraut.  Salted nuts.   Cured or smoked meats (such as hot dogs, bacon, salami, ham, and bologna).   Processed vegetable juices, such as tomato juice.  Buttermilk.  Processed cheeses (such as cheese dips or cheese spread).  Cottage cheese.  Instant hot cereals.  Dessert mixes (ready-to-make) and store-bought cakes and pies.  Crackers with salted tops. This is not a complete list of  high-sodium ingredients. Contact your dietitian for more options.  Document Released: 07/30/2005 Document Revised: 08/04/2013 Document Reviewed: 06/22/2013 [facility] Patient Information 2015 [place], [place]. This information is not intended to replace advice given to you by your health care provider. Make sure you discuss any questions you have with your health care provider.   Health Maintenance A healthy lifestyle and preventative care can promote health and wellness.  Maintain regular health, dental, and eye exams.  Eat a healthy diet. Foods like vegetables, fruits, whole grains, low-fat dairy products, and lean protein foods contain the nutrients you need and are low in calories. Decrease your intake of foods high in solid fats, added sugars, and salt. Get information about a proper diet from your health care provider, if necessary.  Regular physical exercise is one of the most important things you can do for your health. Most adults should get at least 150 minutes of moderate-intensity exercise (any activity that increases your heart rate and causes you to sweat) each week. In addition, most adults need muscle-strengthening exercises on 2 or more days a week.   Maintain a healthy weight. The body mass index (BMI) is a  screening tool to identify possible weight problems. It provides an estimate of body fat based on height and weight. Your health care provider can find your BMI and can help you achieve or maintain a healthy weight. For males 20 years and older:  A BMI below 18.5 is considered underweight.  A BMI of 18.5 to 24.9 is normal.  A BMI of 25 to 29.9 is considered overweight.  A BMI of 30 and above is considered obese.  Maintain normal blood lipids and cholesterol by exercising and minimizing your intake of saturated fat. Eat a balanced diet with plenty of fruits and vegetables. Blood tests for lipids and cholesterol should begin at age 75 and be repeated every 5 years. If your  lipid or cholesterol levels are high, you are over age 33, or you are at high risk for heart disease, you may need your cholesterol levels checked more frequently.Ongoing high lipid and cholesterol levels should be treated with medicines if diet and exercise are not working.  If you smoke, find out from your health care provider how to quit. If you do not use tobacco, do not start.  Lung cancer screening is recommended for adults aged 48-80 years who are at high risk for developing lung cancer because of a history of smoking. A yearly low-dose CT scan of the lungs is recommended for people who have at least a 30-pack-year history of smoking and are current smokers or have quit within the past 15 years. A pack year of smoking is smoking an average of 1 pack of cigarettes a day for 1 year (for example, a 30-pack-year history of smoking could mean smoking 1 pack a day for 30 years or 2 packs a day for 15 years). Yearly screening should continue until the smoker has stopped smoking for at least 15 years. Yearly screening should be stopped for people who develop a health problem that would prevent them from having lung cancer treatment.  If you choose to drink alcohol, do not have more than 2 drinks per day. One drink is considered to be 12 oz (360 mL) of beer, 5 oz (150 mL) of wine, or 1.5 oz (45 mL) of liquor.  Avoid the use of street drugs. Do not share needles with anyone. Ask for help if you need support or instructions about stopping the use of drugs.  High blood pressure causes heart disease and increases the risk of stroke. Blood pressure should be checked at least every 1-2 years. Ongoing high blood pressure should be treated with medicines if weight loss and exercise are not effective.  If you are 9-6 years old, ask your health care provider if you should take aspirin to prevent heart disease.  Diabetes screening involves taking a blood sample to check your fasting blood sugar level. This  should be done once every 3 years after age 81 if you are at a normal weight and without risk factors for diabetes. Testing should be considered at a younger age or be carried out more frequently if you are overweight and have at least 1 risk factor for diabetes.  Colorectal cancer can be detected and often prevented. Most routine colorectal cancer screening begins at the age of 41 and continues through age 14. However, your health care provider may recommend screening at an earlier age if you have risk factors for colon cancer. On a yearly basis, your health care provider may provide home test kits to check for hidden blood in the stool. A small camera  at the end of a tube may be used to directly examine the colon (sigmoidoscopy or colonoscopy) to detect the earliest forms of colorectal cancer. Talk to your health care provider about this at age 8 when routine screening begins. A direct exam of the colon should be repeated every 5-10 years through age 6, unless early forms of precancerous polyps or small growths are found.  People who are at an increased risk for hepatitis B should be screened for this virus. You are considered at high risk for hepatitis B if:  You were born in a country where hepatitis B occurs often. Talk with your health care provider about which countries are considered high risk.  Your parents were born in a high-risk country and you have not received a shot to protect against hepatitis B (hepatitis B vaccine).  You have HIV or AIDS.  You use needles to inject street drugs.  You live with, or have sex with, someone who has hepatitis B.  You are a man who has sex with other men (MSM).  You get hemodialysis treatment.  You take certain medicines for conditions like cancer, organ transplantation, and autoimmune conditions.  Hepatitis C blood testing is recommended for all people born from [birth year] through 1966 and any individual with known risk factors for hepatitis  C.  Healthy men should no longer receive prostate-specific antigen (PSA) blood tests as part of routine cancer screening. Talk to your health care provider about prostate cancer screening.  Testicular cancer screening is not recommended for adolescents or adult males who have no symptoms. Screening includes self-exam, a health care provider exam, and other screening tests. Consult with your health care provider about any symptoms you have or any concerns you have about testicular cancer.  Practice safe sex. Use condoms and avoid high-risk sexual practices to reduce the spread of sexually transmitted infections (STIs).  You should be screened for STIs, including gonorrhea and chlamydia if:  You are sexually active and are younger than 24 years.  You are older than 24 years, and your health care provider tells you that you are at risk for this type of infection.  Your sexual activity has changed since you were last screened, and you are at an increased risk for chlamydia or gonorrhea. Ask your health care provider if you are at risk.  If you are at risk of being infected with HIV, it is recommended that you take a prescription medicine daily to prevent HIV infection. This is called pre-exposure prophylaxis (PrEP). You are considered at risk if:  You are a man who has sex with other men (MSM).  You are a heterosexual man who is sexually active with multiple partners.  You take drugs by injection.  You are sexually active with a partner who has HIV.  Talk with your health care provider about whether you are at high risk of being infected with HIV. If you choose to begin PrEP, you should first be tested for HIV. You should then be tested every 3 months for as Mondesir as you are taking PrEP.  Use sunscreen. Apply sunscreen liberally and repeatedly throughout the day. You should seek shade when your shadow is shorter than you. Protect yourself by wearing Youkhana sleeves, pants, a wide-brimmed hat, and  sunglasses year round whenever you are outdoors.  Tell your health care provider of new moles or changes in moles, especially if there is a change in shape or color. Also, tell your health care provider if a mole  is larger than the size of a pencil eraser.  A one-time screening for abdominal aortic aneurysm (AAA) and surgical repair of large AAAs by ultrasound is recommended for men aged 47-75 years who are current or former smokers.  Stay current with your vaccines (immunizations). Document Released: 01/26/2008 Document Revised: 08/04/2013 Document Reviewed: 12/25/2010 [facility] Patient Information 2015 [place], [place]. This information is not intended to replace advice given to you by your health care provider. Make sure you discuss any questions you have with your health care provider.

## 2014-12-15 ENCOUNTER — Encounter: Payer: Self-pay | Admitting: Family

## 2014-12-21 NOTE — Telephone Encounter (Signed)
Call to the Free clinic at [phone] and LVM for fax of last office visit and labs to Manuela Schwartz at [phone] Or call the office [phone] for questions; Patient stated he signed a release for the office.

## 2014-12-22 ENCOUNTER — Telehealth: Payer: Self-pay

## 2014-12-22 ENCOUNTER — Other Ambulatory Visit: Payer: Self-pay

## 2014-12-22 LAB — LIPID PANEL
Chol/HDL Ratio, serum: 3.8
Cholesterol, Total: 137
HDL: 36 mg/dL (ref 35–70)
LDL Cholesterol: 66 mg/dL
Triglycerides: 173
VLDL Cholesterol, Calc: 35

## 2014-12-22 NOTE — Telephone Encounter (Signed)
[facility] also faxed in CBC and Comprehensive metabolic panel with lipid panel (the later recorded) Scanned to epicl

## 2014-12-22 NOTE — Telephone Encounter (Signed)
Rec'd documentation on last office note; with the latest labs drawn prior to the patient being seen at this office. Chol values abstracted with date being 06/11/2014 VS; 110/80/ 71in /283 and BMI 39.56 on 06/16/2014 Trig remain high per the note Meds;  Crestor 40 mg qd Lisinopril 40mg  qd ASA 81mg  qd Plavix  Fish oil 2 qd Not taking ASA Plan was to increase fish oil to 3 daily; get back on asa; rto in 3 months  Will scan note in Epic

## 2014-12-24 NOTE — Telephone Encounter (Signed)
Lipids were entered; brief office note scanned.

## 2015-01-11 ENCOUNTER — Telehealth: Payer: Self-pay | Admitting: Family

## 2015-01-11 MED ORDER — SIMVASTATIN 20 MG PO TABS: 20.0000 mg | ORAL_TABLET | Freq: Every day | ORAL | Status: DC

## 2015-01-11 MED ORDER — LISINOPRIL 40 MG PO TABS: 40.0000 mg | ORAL_TABLET | Freq: Every day | ORAL | Status: DC

## 2015-01-11 MED ORDER — CLOPIDOGREL BISULFATE 75 MG PO TABS: 75.0000 mg | ORAL_TABLET | Freq: Every day | ORAL | Status: DC

## 2015-01-11 NOTE — Telephone Encounter (Signed)
Pt called in and needs refill on his  Lisinopril  clopidogrel  Simvastatin    Please send to   [facility], [place]

## 2015-01-11 NOTE — Telephone Encounter (Signed)
Rxs sent

## 2015-05-04 ENCOUNTER — Other Ambulatory Visit (INDEPENDENT_AMBULATORY_CARE_PROVIDER_SITE_OTHER): Payer: PPO

## 2015-05-04 ENCOUNTER — Encounter: Payer: Self-pay | Admitting: Family

## 2015-05-04 ENCOUNTER — Ambulatory Visit (INDEPENDENT_AMBULATORY_CARE_PROVIDER_SITE_OTHER): Payer: PPO | Admitting: Family

## 2015-05-04 ENCOUNTER — Ambulatory Visit (INDEPENDENT_AMBULATORY_CARE_PROVIDER_SITE_OTHER)
Admission: RE | Admit: 2015-05-04 | Discharge: 2015-05-04 | Disposition: A | Discharge status: Home / Self Care | Payer: PPO | Source: Ambulatory Visit | Attending: Family | Admitting: Family

## 2015-05-04 VITALS — BP 122/80 | HR 72 | Temp 98.6°F | Resp 18 | Ht 72.05 in | Wt 289.0 lb

## 2015-05-04 DIAGNOSIS — R2242 Localized swelling, mass and lump, left lower limb: Secondary | ICD-10-CM | POA: Diagnosis not present

## 2015-05-04 DIAGNOSIS — E785 Hyperlipidemia, unspecified: Secondary | ICD-10-CM | POA: Diagnosis not present

## 2015-05-04 DIAGNOSIS — I10 Essential (primary) hypertension: Secondary | ICD-10-CM

## 2015-05-04 DIAGNOSIS — M25511 Pain in right shoulder: Secondary | ICD-10-CM | POA: Insufficient documentation

## 2015-05-04 LAB — LIPID PANEL
CHOL/HDL RATIO: 4
Cholesterol: 153 mg/dL (ref 0–200)
HDL: 36.9 mg/dL — AB (ref >39.00)
LDL Cholesterol: 82 mg/dL (ref 0–99)
NonHDL: 115.87
TRIGLYCERIDES: 168 mg/dL — AB (ref 0.0–149.0)
VLDL: 33.6 mg/dL (ref 0.0–40.0)

## 2015-05-04 LAB — COMPREHENSIVE METABOLIC PANEL
ALBUMIN: 4.3 g/dL (ref 3.5–5.2)
ALT: 31 U/L (ref 0–53)
AST: 19 U/L (ref 0–37)
Alkaline Phosphatase: 63 U/L (ref 39–117)
BUN: 15 mg/dL (ref 6–23)
CHLORIDE: 101 meq/L (ref 96–112)
CO2: 31 mEq/L (ref 19–32)
CREATININE: 0.67 mg/dL (ref 0.40–1.50)
Calcium: 9.2 mg/dL (ref 8.4–10.5)
GFR: 128.33 mL/min (ref >60.00)
GLUCOSE: 110 mg/dL — AB (ref 70–99)
Potassium: 4.6 mEq/L (ref 3.5–5.1)
Sodium: 137 mEq/L (ref 135–145)
Total Bilirubin: 0.6 mg/dL (ref 0.2–1.2)
Total Protein: 7.4 g/dL (ref 6.0–8.3)

## 2015-05-04 LAB — HEMOGLOBIN A1C: HEMOGLOBIN A1C: 5.7 % (ref 4.6–6.5)

## 2015-05-04 NOTE — Assessment & Plan Note (Signed)
Right shoulder pain of potential subacromial bursitis or possible rotator cuff pathology. Obtain x-ray to rule out calcification. Initiate home exercise therapy along with conservative treatment of ice/heat as needed and OTC anti-inflammatories. Follow up with Sports Medicine or Orthopedics pending x-ray results.

## 2015-05-04 NOTE — Assessment & Plan Note (Signed)
Mass of thigh appears benign and consistent with a large skin tag. Given area, recommend follow up with general surgery for removal. Follow up if symptoms worsen or are not improved pending referral.

## 2015-05-04 NOTE — Progress Notes (Signed)
Pre visit review using our clinic review tool, if applicable. No additional management support is needed unless otherwise documented below in the visit note. 

## 2015-05-04 NOTE — Progress Notes (Signed)
Subjective:    Patient ID: Todd Weeks, male    DOB: [birth year], 59 y.o.   MRN: [id number]  Chief Complaint  Patient presents with  . Shoulder Pain    has a persistant pain in his right shoulder, started 2 months ago, 2 weeks ago it got to where he could not lift his arm up, now it is back to where it was, also has a place on the back of his left leg upper thigh that he wants checked out. blood work done     HPI:  Todd Weeks is a 59 y.o. male who  has a past medical history of HTN (hypertension); Diverticulitis; PVD (peripheral vascular disease); Chest pain; Headache(784.0); Light-headedness; Shortness of breath; Calf cramp; Fatigue; Depression; and Sexual dysfunction. and presents today for an office follow up.  1.) Shoulder pain - This is a new problem. Associated symptom of pain located in his right shoulder has ben going on for about 2 months and about 2 weeks indicates he was not able to lift his arm. Describes it as painful and sore. Majority of the pain lasted about 4 days and has since improved but still remains in pain. Indicates difficulty with dressing and toileting secondary to discomfort. At its worst it may have been a 10/10. Modifying factors include ibuprofen which provided a little relief. Denies any trauma or sounds or sensations heard or felt.   2.) Spot on leg - A spot, located on his left posterior upper thigh has been going on for about 2 years. Notes that it has gotten slightly bigger over the 2 years. Describes as a mass that is no painful but irritating. Denies any treatments attempted.   3.) Hypercholesterolemia - Currently maintained on simvastatn. Takes the medications as prescribed and denies adverse side effects or myalgias.   No Known Allergies   Current Outpatient Prescriptions on File Prior to Visit  Medication Sig Dispense Refill  . clopidogrel (PLAVIX) 75 MG tablet Take 1 tablet (75 mg total) by mouth daily. 30 tablet 2  . lisinopril (PRINIVIL,ZESTRIL)  40 MG tablet Take 1 tablet (40 mg total) by mouth daily. 30 tablet 2  . simvastatin (ZOCOR) 20 MG tablet Take 1 tablet (20 mg total) by mouth daily. 30 tablet 2   No current facility-administered medications on file prior to visit.    Past Medical History  Diagnosis Date  . HTN (hypertension)   . Diverticulitis   . PVD (peripheral vascular disease)   . Chest pain   . Headache(784.0)   . Light-headedness   . Shortness of breath   . Calf cramp   . Fatigue   . Depression   . Sexual dysfunction      Review of Systems  Constitutional: Negative for fever and chills.  Respiratory: Negative for cough and chest tightness.   Cardiovascular: Negative for chest pain, palpitations and leg swelling.  Musculoskeletal:       Positive for right shoulder pain.   Skin:       Positive for a posterior mass on his left thigh  Neurological: Negative for headaches.      Objective:    BP 122/80 mmHg  Pulse 72  Temp(Src) 98.6 F (37 C) (Oral)  Resp 18  Ht 6' 0.05" (1.83 m)  Wt 289 lb (131.09 kg)  BMI 39.14 kg/m2  SpO2 92% Nursing note and vital signs reviewed.  Physical Exam  Constitutional: He is oriented to person, place, and time. He appears well-developed and well-nourished.  No distress.  Cardiovascular: Normal rate, regular rhythm, normal heart sounds and intact distal pulses.   Pulmonary/Chest: Effort normal and breath sounds normal.  Musculoskeletal:  Left shoulder with no obvious deformity, discoloration, or edema. Tenderness elicited over her subacromial space and mildly over supraspinatus tendon. Range of motion is decreased in flexion and abduction compared to the contralateral side. Distal pulses and sensation are intact and appropriate. Empty can is positive for discomfort. Michel Bickers is questionably positive as patient was not able to relax. Apprehension is negative.  Neurological: He is alert and oriented to person, place, and time.  Skin: Skin is warm and dry.       Psychiatric: He has a normal mood and affect. His behavior is normal. Judgment and thought content normal.       Assessment & Plan:   Problem List Items Addressed This Visit      Cardiovascular and Mediastinum   HYPERTENSION, BENIGN ESSENTIAL   Relevant Orders   Lipid Profile (Completed)   Comp Met (CMET) (Completed)   Hemoglobin A1c (Completed)     Other   Hyperlipidemia    Stable with current regimen of simvastatin. Denies adverse effects or myalgia. Obtain lipid profile, A1c and CMET. Continue current dosage of simvastatin. Follow up pending lab work.       Relevant Orders   Lipid Profile (Completed)   Comp Met (CMET) (Completed)   Hemoglobin A1c (Completed)   Right shoulder pain - Primary    Right shoulder pain of potential subacromial bursitis or possible rotator cuff pathology. Obtain x-ray to rule out calcification. Initiate home exercise therapy along with conservative treatment of ice/heat as needed and OTC anti-inflammatories. Follow up with Sports Medicine or Orthopedics pending x-ray results.       Relevant Orders   DG Shoulder Right (Completed)   Mass of thigh    Mass of thigh appears benign and consistent with a large skin tag. Given area, recommend follow up with general surgery for removal. Follow up if symptoms worsen or are not improved pending referral.       Relevant Orders   Ambulatory referral to General Surgery

## 2015-05-04 NOTE — Assessment & Plan Note (Signed)
Stable with current regimen of simvastatin. Denies adverse effects or myalgia. Obtain lipid profile, A1c and CMET. Continue current dosage of simvastatin. Follow up pending lab work.

## 2015-05-04 NOTE — Patient Instructions (Addendum)
Thank you for choosing [facility].  Summary/Instructions:  Please stop by the lab on the basement level of the building for your blood work. Your results will be released to [facility] (or called to you) after review, usually within 72 hours after test completion. If any changes need to be made, you will be notified at that same time.  Please stop by radiology on the basement level of the building for your x-rays. Your results will be released to [facility] (or called to you) after review, usually within 72 hours after test completion. If any treatments or changes are necessary, you will be notified at that same time.  Referrals have been made during this visit. You should expect to hear back from our schedulers in about 7-10 days in regards to establishing an appointment with the specialists we discussed.   If your symptoms worsen or fail to improve, please contact our office for further instruction, or in case of emergency go directly to the emergency room at the closest medical facility.   Ice 2-3 times per day for about 20 minutes or as needed. Home exercises as described below and over the counter anti-inflammatories as needed (Ibuprofen / Aleve).   Shoulder Exercises EXERCISES  RANGE OF MOTION (ROM) AND STRETCHING EXERCISES These exercises may help you when beginning to rehabilitate your injury. Your symptoms may resolve with or without further involvement from your physician, physical therapist or athletic trainer. While completing these exercises, remember:   Restoring tissue flexibility helps normal motion to return to the joints. This allows healthier, less painful movement and activity.  An effective stretch should be held for at least 30 seconds.  A stretch should never be painful. You should only feel a gentle lengthening or release in the stretched tissue. ROM - Pendulum  Bend at the waist so that your right / left arm falls away from your body. Support yourself with your  opposite hand on a solid surface, such as a table or a countertop.  Your right / left arm should be perpendicular to the ground. If it is not perpendicular, you need to lean over farther. Relax the muscles in your right / left arm and shoulder as much as possible.  Gently sway your hips and trunk so they move your right / left arm without any use of your right / left shoulder muscles.  Progress your movements so that your right / left arm moves side to side, then forward and backward, and finally, both clockwise and counterclockwise.  Complete __________ repetitions in each direction. Many people use this exercise to relieve discomfort in their shoulder as well as to gain range of motion. Repeat __________ times. Complete this exercise __________ times per day. STRETCH - Flexion, Standing  Stand with good posture. With an underhand grip on your right / left hand and an overhand grip on the opposite hand, grasp a broomstick or cane so that your hands are a little more than shoulder-width apart.  Keeping your right / left elbow straight and shoulder muscles relaxed, push the stick with your opposite hand to raise your right / left arm in front of your body and then overhead. Raise your arm until you feel a stretch in your right / left shoulder, but before you have increased shoulder pain.  Try to avoid shrugging your right / left shoulder as your arm rises by keeping your shoulder blade tucked down and toward your mid-back spine. Hold __________ seconds.  Slowly return to the starting position. Repeat  __________ times. Complete this exercise __________ times per day. STRETCH - Internal Rotation  Place your right / left hand behind your back, palm-up.  Throw a towel or belt over your opposite shoulder. Grasp the towel/belt with your right / left hand.  While keeping an upright posture, gently pull up on the towel/belt until you feel a stretch in the front of your right / left  shoulder.  Avoid shrugging your right / left shoulder as your arm rises by keeping your shoulder blade tucked down and toward your mid-back spine.  Hold __________. Release the stretch by lowering your opposite hand. Repeat __________ times. Complete this exercise __________ times per day. STRETCH - External Rotation and Abduction  Stagger your stance through a doorframe. It does not matter which foot is forward.  As instructed by your physician, physical therapist or athletic trainer, place your hands:  And forearms above your head and on the door frame.  And forearms at head-height and on the door frame.  At elbow-height and on the door frame.  Keeping your head and chest upright and your stomach muscles tight to prevent over-extending your low-back, slowly shift your weight onto your front foot until you feel a stretch across your chest and/or in the front of your shoulders.  Hold __________ seconds. Shift your weight to your back foot to release the stretch. Repeat __________ times. Complete this stretch __________ times per day.  STRENGTHENING EXERCISES  These exercises may help you when beginning to rehabilitate your injury. They may resolve your symptoms with or without further involvement from your physician, physical therapist or athletic trainer. While completing these exercises, remember:   Muscles can gain both the endurance and the strength needed for everyday activities through controlled exercises.  Complete these exercises as instructed by your physician, physical therapist or athletic trainer. Progress the resistance and repetitions only as guided.  You may experience muscle soreness or fatigue, but the pain or discomfort you are trying to eliminate should never worsen during these exercises. If this pain does worsen, stop and make certain you are following the directions exactly. If the pain is still present after adjustments, discontinue the exercise until you can  discuss the trouble with your clinician.  If advised by your physician, during your recovery, avoid activity or exercises which involve actions that place your right / left hand or elbow above your head or behind your back or head. These positions stress the tissues which are trying to heal. STRENGTH - Scapular Depression and Adduction  With good posture, sit on a firm chair. Supported your arms in front of you with pillows, arm rests or a table top. Have your elbows in line with the sides of your body.  Gently draw your shoulder blades down and toward your mid-back spine. Gradually increase the tension without tensing the muscles along the top of your shoulders and the back of your neck.  Hold for __________ seconds. Slowly release the tension and relax your muscles completely before completing the next repetition.  After you have practiced this exercise, remove the arm support and complete it in standing as well as sitting. Repeat __________ times. Complete this exercise __________ times per day.  STRENGTH - External Rotators  Secure a rubber exercise band/tubing to a fixed object so that it is at the same height as your right / left elbow when you are standing or sitting on a firm surface.  Stand or sit so that the secured exercise band/tubing is at your  side that is not injured.  Bend your elbow 90 degrees. Place a folded towel or small pillow under your right / left arm so that your elbow is a few inches away from your side.  Keeping the tension on the exercise band/tubing, pull it away from your body, as if pivoting on your elbow. Be sure to keep your body steady so that the movement is only coming from your shoulder rotating.  Hold __________ seconds. Release the tension in a controlled manner as you return to the starting position. Repeat __________ times. Complete this exercise __________ times per day.  STRENGTH - Supraspinatus  Stand or sit with good posture. Grasp a __________  weight or an exercise band/tubing so that your hand is "thumbs-up," like when you shake hands.  Slowly lift your right / left hand from your thigh into the air, traveling about 30 degrees from straight out at your side. Lift your hand to shoulder height or as far as you can without increasing any shoulder pain. Initially, many people do not lift their hands above shoulder height.  Avoid shrugging your right / left shoulder as your arm rises by keeping your shoulder blade tucked down and toward your mid-back spine.  Hold for __________ seconds. Control the descent of your hand as you slowly return to your starting position. Repeat __________ times. Complete this exercise __________ times per day.  STRENGTH - Shoulder Extensors  Secure a rubber exercise band/tubing so that it is at the height of your shoulders when you are either standing or sitting on a firm arm-less chair.  With a thumbs-up grip, grasp an end of the band/tubing in each hand. Straighten your elbows and lift your hands straight in front of you at shoulder height. Step back away from the secured end of band/tubing until it becomes tense.  Squeezing your shoulder blades together, pull your hands down to the sides of your thighs. Do not allow your hands to go behind you.  Hold for __________ seconds. Slowly ease the tension on the band/tubing as you reverse the directions and return to the starting position. Repeat __________ times. Complete this exercise __________ times per day.  STRENGTH - Scapular Retractors  Secure a rubber exercise band/tubing so that it is at the height of your shoulders when you are either standing or sitting on a firm arm-less chair.  With a palm-down grip, grasp an end of the band/tubing in each hand. Straighten your elbows and lift your hands straight in front of you at shoulder height. Step back away from the secured end of band/tubing until it becomes tense.  Squeezing your shoulder blades together,  draw your elbows back as you bend them. Keep your upper arm lifted away from your body throughout the exercise.  Hold __________ seconds. Slowly ease the tension on the band/tubing as you reverse the directions and return to the starting position. Repeat __________ times. Complete this exercise __________ times per day. STRENGTH - Scapular Depressors  Find a sturdy chair without wheels, such as a from a dining room table.  Keeping your feet on the floor, lift your bottom from the seat and lock your elbows.  Keeping your elbows straight, allow gravity to pull your body weight down. Your shoulders will rise toward your ears.  Raise your body against gravity by drawing your shoulder blades down your back, shortening the distance between your shoulders and ears. Although your feet should always maintain contact with the floor, your feet should progressively support less body  weight as you get stronger.  Hold __________ seconds. In a controlled and slow manner, lower your body weight to begin the next repetition. Repeat __________ times. Complete this exercise __________ times per day.  Document Released: 06/13/2005 Document Revised: 10/22/2011 Document Reviewed: 11/11/2008 [facility] Patient Information 2015 [place], [place]. This information is not intended to replace advice given to you by your health care provider. Make sure you discuss any questions you have with your health care provider.

## 2015-05-23 ENCOUNTER — Other Ambulatory Visit: Payer: Self-pay | Admitting: Family

## 2015-06-01 ENCOUNTER — Encounter: Payer: Self-pay | Admitting: Family Medicine

## 2015-06-01 ENCOUNTER — Ambulatory Visit (INDEPENDENT_AMBULATORY_CARE_PROVIDER_SITE_OTHER): Payer: PPO | Admitting: Family Medicine

## 2015-06-01 ENCOUNTER — Other Ambulatory Visit (INDEPENDENT_AMBULATORY_CARE_PROVIDER_SITE_OTHER): Payer: PPO

## 2015-06-01 VITALS — BP 134/78 | HR 74 | Ht 72.0 in | Wt 293.0 lb

## 2015-06-01 DIAGNOSIS — M7551 Bursitis of right shoulder: Secondary | ICD-10-CM

## 2015-06-01 DIAGNOSIS — M25511 Pain in right shoulder: Secondary | ICD-10-CM | POA: Diagnosis not present

## 2015-06-01 DIAGNOSIS — M7550 Bursitis of unspecified shoulder: Secondary | ICD-10-CM | POA: Insufficient documentation

## 2015-06-01 NOTE — Patient Instructions (Addendum)
Good to see you.  Ice 20 minutes 2 times daily. Usually after activity and before bed. Exercises 3 times a week.  Duexis 3 times daily for 3 days then as needed Vitamin D 2000 IU daily  Tumeric 500mg  daily See me agai nin 3 weeks and if not perfect we will do injection.

## 2015-06-01 NOTE — Progress Notes (Signed)
Pre visit review using our clinic review tool, if applicable. No additional management support is needed unless otherwise documented below in the visit note. 

## 2015-06-01 NOTE — Assessment & Plan Note (Signed)
Patient's most concerning pounds likely more the bursitis and the articular side tear. We discussed with patient that I do not see any significant weakness. Patient will try home exercises and work with athletic trainer, icing protocol and we discussed over-the-counter natural supplementation. Short course of anti-inflammatories but we did not want to do this [place]-term secondary to patient being on a blood thinner. Patient will come back in 3 weeks. If continuing have pain I would consider injection and possible referral to formal physical therapy.

## 2015-06-01 NOTE — Progress Notes (Signed)
[facility] [address] [place], [place] [address] Phone: [phone] Subjective:    I'm seeing this patient by the request  of:  Mauricio Po, FNP   CC: Right shoulder pain  GYI:RSWNIOEVOJ SHRAY HUNLEY is a 59 y.o. male coming in with complaint of right shoulder pain. Has been going on for 6-7 months. Does not remember any true injury. Patient states that for 3-4 days when it initially started patient was unable to even lift his arm. An states now he is having increasing range of motion. States that the pain seems to be improving but continues to have discomfort with certain range of motion. Can wake him up at night. No significant radiation. Rates the severity of pain when it occurs is 8 out of 10 but only last seconds. Denies any weakness of the hand and denies any associated neck pain. Patient did see primary care provider and patient did have x-rays of the shoulder. X-ray show that patient did have acromioclavicular arthritis but otherwise fairly unremarkable. Has tried over-the-counter Aleve with mild benefit.  Past Medical History  Diagnosis Date  . HTN (hypertension)   . Diverticulitis   . PVD (peripheral vascular disease) ([place] [place])   . Chest pain   . Headache(784.0)   . Light-headedness   . Shortness of breath   . Calf cramp   . Fatigue   . Depression   . Sexual dysfunction    Past Surgical History  Procedure Laterality Date  . Femoral bypass     Social History  Substance Use Topics  . Smoking status: Former Smoker -- 1.50 packs/day for 46 years    Types: Cigarettes    Start date: 04/16/2012    Quit date: 07/13/2013  . Smokeless tobacco: Never Used  . Alcohol Use: 0.6 oz/week    1 Cans of beer per week     Comment: couple of beers a weekn   No Known Allergies Family History  Problem Relation Age of Onset  . Hypertension Mother   . Diabetes Brother   . Deep vein thrombosis Father   . Tuberculosis Maternal Grandfather          Past medical history, social, surgical and family history all reviewed in electronic medical record.   Review of Systems: No headache, visual changes, nausea, vomiting, diarrhea, constipation, dizziness, abdominal pain, skin rash, fevers, chills, night sweats, weight loss, swollen lymph nodes, body aches, joint swelling, muscle aches, chest pain, shortness of breath, mood changes.   Objective Blood pressure 134/78, pulse 74, height 6' (1.829 m), weight 293 lb (132.904 kg), SpO2 95 %.  General: No apparent distress alert and oriented x3 mood and affect normal, dressed appropriately.  HEENT: Pupils equal, extraocular movements intact  Respiratory: Patient's speak in full sentences and does not appear short of breath  Cardiovascular: No lower extremity edema, non tender, no erythema  Skin: Warm dry intact with no signs of infection or rash on extremities or on axial skeleton.  Abdomen: Soft nontender  Neuro: Cranial nerves II through XII are intact, neurovascularly intact in all extremities with 2+ DTRs and 2+ pulses.  Lymph: No lymphadenopathy of posterior or anterior cervical chain or axillae bilaterally.  Gait normal with good balance and coordination.  MSK:  Non tender with full range of motion and good stability and symmetric strength and tone of  elbows, wrist, hip, knee and ankles bilaterally.  Shoulder: Right Inspection reveals no abnormalities, atrophy or asymmetry. Palpation is normal with no  tenderness over AC joint or bicipital groove. ROM is full in all planes passively. Rotator cuff strength normal throughout. signs of impingement with positive Neer and Hawkin's tests, but negative empty can sign. Speeds and Yergason's tests normal. No labral pathology noted with negative Obrien's, negative clunk and good stability. Normal scapular function observed. No painful arc and no drop arm sign. No apprehension sign  MSK US performed of: Right This study was ordered, performed, and  interpreted by Charlann Boxer D.O.  Shoulder:   Supraspinatus:  Amild tear noted of the supraspinatus tendon articular side but less than 20% tendon with no significant retraction, Bursal bulge seen with shoulder abduction on impingement view. Infraspinatus:  Appears normal on [place] and transverse views. Significant increase in Doppler flow Subscapularis:  Appears normal on Bang[place] and transverse views. Positive bursa Teres Minor:  Appears normal on [place] and transverse views. AC joint:  Capsule undistended, no geyser sign. Glenohumeral Joint:  Appears normal without effusion. Glenoid Labrum:  Intact without visualized tears. Biceps Tendon:  Appears normal on Bensinger and transverse views, no fraying of tendon, tendon located in intertubercular groove, no subluxation with shoulder internal or external rotation.  Impression: Subacromial bursitis with mild articular tear of the supraspinatus  Procedure note [id number]; 15 minutes spent for Therapeutic exercises as stated in above notes.  This included exercises focusing on stretching, strengthening, with significant focus on eccentric aspects. Basic scapular stabilization to include adduction and depression of scapula Scaption, focusing on proper movement and good control Internal and External rotation utilizing a theraband, with elbow tucked at side entire time Rows with theraband  Proper technique shown and discussed handout in great detail with ATC.  All questions were discussed and answered.      Impression and Recommendations:     This case required medical decision making of moderate complexity.

## 2015-06-20 ENCOUNTER — Other Ambulatory Visit: Payer: Self-pay | Admitting: Family

## 2015-06-22 ENCOUNTER — Ambulatory Visit: Payer: PPO | Admitting: Family Medicine

## 2015-07-15 ENCOUNTER — Telehealth: Payer: Self-pay | Admitting: Family

## 2015-07-15 DIAGNOSIS — I70219 Atherosclerosis of native arteries of extremities with intermittent claudication, unspecified extremity: Secondary | ICD-10-CM

## 2015-07-15 DIAGNOSIS — I2510 Atherosclerotic heart disease of native coronary artery without angina pectoris: Secondary | ICD-10-CM

## 2015-07-15 NOTE — Telephone Encounter (Signed)
Pt requesting a prescription to see a dietician. His insurance requires a prescription Please advise

## 2015-07-18 NOTE — Telephone Encounter (Signed)
Referral placed.

## 2015-07-18 NOTE — Telephone Encounter (Signed)
Please advise 

## 2015-07-19 ENCOUNTER — Other Ambulatory Visit: Payer: Self-pay | Admitting: Family

## 2015-07-22 ENCOUNTER — Telehealth: Payer: Self-pay | Admitting: Family

## 2015-07-22 MED ORDER — CLOPIDOGREL BISULFATE 75 MG PO TABS: 75.0000 mg | ORAL_TABLET | Freq: Every day | ORAL | Status: DC

## 2015-07-22 NOTE — Telephone Encounter (Signed)
Pt called stating the pharmacy did not receive the prescription for clopidogrel (PLAVIX) 75 MG tablet [id number] Can you please resend this

## 2015-07-22 NOTE — Telephone Encounter (Signed)
Recent to [employer]...Todd Weeks

## 2015-08-11 ENCOUNTER — Encounter: Payer: Self-pay | Admitting: Nutrition

## 2015-08-11 ENCOUNTER — Encounter: Payer: PPO | Attending: Family | Admitting: Nutrition

## 2015-08-11 VITALS — Ht 72.0 in | Wt 297.0 lb

## 2015-08-11 DIAGNOSIS — Z6841 Body Mass Index (BMI) 40.0 and over, adult: Secondary | ICD-10-CM | POA: Diagnosis not present

## 2015-08-11 DIAGNOSIS — I2510 Atherosclerotic heart disease of native coronary artery without angina pectoris: Secondary | ICD-10-CM | POA: Insufficient documentation

## 2015-08-11 DIAGNOSIS — Z713 Dietary counseling and surveillance: Secondary | ICD-10-CM | POA: Diagnosis not present

## 2015-08-11 DIAGNOSIS — R739 Hyperglycemia, unspecified: Secondary | ICD-10-CM

## 2015-08-11 NOTE — Progress Notes (Signed)
  Medical Nutrition Therapy:  Appt start time: 0800 end time:  0930.  Assessment:  Primary concerns today:  Obesity, PRediabetes (A1C 5.7%) and CAD.Marland Kitchen Lives by himself. Was a Chef for 63 years. Has been on disaiblity due to neuropathy. Underlying depression but not being treated. Willing to talk to his PCP about medication for it to help improve his attitude and motivation to take better care of himself physically and emotionally.. Meals: most foods are prepared pan seared and he eats out a lot. Meals per day eaten: Eats 2-3 times per day. Physical activity: not much due to severe leg pain and not able to stand on feet for [place] periods of time. Has gained 80 lbs in the last 2 years. HIs legs are limiting his mobility and ability to exercise. Diet is excessive in calories, protein, fat and sodium and low in fresh fruit, vegetables and whole grains. He drinks a lot of milk that contributes excessive calories, fat and carbs.  Willing to look into exercising at the [facility] of water aerobics or recumbent bike for increased physical activity.  Preferred Learning Style:  No preference indicated   Learning Readiness:  Ready  Change in progress   MEDICATIONS: See list   DIETARY INTAKE  24-hr recall:  B ( AM): Eggs 3, toast -2  Whole wheat,  OR ham, 2% milk 16- 20 oz. Snk ( AM): none;  2 glasses of water. 8 oz BID. L ( PM):skipped Snk ( PM): Fruit shake: ( strawberry, banana, pineapple, coconut water, ff yogurt -16 oz) D ( PM):  Chicken and dumplings 1 1/2 c, 16 oz of milk Snk ( PM): misc: sometimes: hummus and crackers. Beverages: 2% milk, water  Usual physical activity: ADL  Estimated energy needs: 1800 calories 200 g carbohydrates 135 g protein 50 g fat  Progress Towards Goal(s):  In progress.   Nutritional Diagnosis:  NB-1.1 Food and nutrition-related knowledge deficit As related to Prediabetes and Obesity.  As evidenced by A1C 5.7%, and BMI > 40.    Intervention:  Nutrition and  Diabetes education provided on My Plate, CHO counting, meal planning, portion sizes, timing of meals, avoiding snacks between meals unless having a low blood sugar, target ranges for A1C and blood sugars, signs/symptoms and treatment of hyper/hypoglycemia, monitoring blood sugars, taking medications as prescribed, benefits of exercising 30 minutes per day and prevention of complications of DM.   Goals:  1. Follow My Plate Method 2. Eat 3 carb choices per meal. 3. Increase fresh fruits and low carb vegetables. 4. Cut down on eating out and choose lower fat/lower sodium items. 5. Increase water to 64 oz per day(4-5, 16 oz bottles of water) 6. Eat three balanced meals at times discussed. 7. No snacks between meals. 8. Measure foods out. 9. Lose 1-2 lbs per week. 10. Get A1C down below 5.7%  Teaching Method Utilized:  Visual Auditory Hands on  Handouts given during visit include:  The Plate Method   Meal Plan Card  Diabetes Instructions.  Barriers to learning/adherence to lifestyle change: None  Demonstrated degree of understanding via:  Teach Back   Monitoring/Evaluation:  Dietary intake, exercise, meal planning, and body weight in 1 month(s).

## 2015-08-11 NOTE — Patient Instructions (Signed)
Goals:  1. Follow My Plate Method 2. Eat 3 carb choices per meal. 3. Increase fresh fruits and low carb vegetables. 4. Cut down on eating out and choose lower fat/lower sodium items. 5. Increase water to 64 oz per day(4-5, 16 oz bottles of water) 6. Eat three balanced meals at times discussed. 7. No snacks between meals. 8. Measure foods out. 9. Lose 1-2 lbs per week. 10. Get A1C down below 5.7%

## 2015-08-24 ENCOUNTER — Other Ambulatory Visit: Payer: Self-pay | Admitting: Family

## 2015-09-14 ENCOUNTER — Ambulatory Visit: Payer: PPO | Admitting: Nutrition

## 2015-09-27 ENCOUNTER — Other Ambulatory Visit: Payer: Self-pay | Admitting: Family

## 2015-10-27 ENCOUNTER — Ambulatory Visit (INDEPENDENT_AMBULATORY_CARE_PROVIDER_SITE_OTHER): Payer: PPO | Admitting: Internal Medicine

## 2015-10-27 ENCOUNTER — Encounter: Payer: Self-pay | Admitting: Internal Medicine

## 2015-10-27 VITALS — BP 124/86 | HR 89 | Ht 72.0 in | Wt 302.6 lb

## 2015-10-27 DIAGNOSIS — R0602 Shortness of breath: Secondary | ICD-10-CM | POA: Diagnosis not present

## 2015-10-27 DIAGNOSIS — I10 Essential (primary) hypertension: Secondary | ICD-10-CM | POA: Diagnosis not present

## 2015-10-27 DIAGNOSIS — R0600 Dyspnea, unspecified: Secondary | ICD-10-CM

## 2015-10-27 MED ORDER — IRBESARTAN 300 MG PO TABS: 300.0000 mg | ORAL_TABLET | Freq: Every day | ORAL | Status: DC

## 2015-10-27 MED ORDER — FAMOTIDINE 20 MG PO TABS: ORAL_TABLET | ORAL | Status: DC

## 2015-10-27 NOTE — Progress Notes (Signed)
   Subjective:    Patient ID: Todd Weeks, male    DOB: October 02, 1955,    MRN: [id number]  HPI  39 yowm quit smoking 2013 with doe @ 47 and wt gain and doe gradually worse since then so referred to pulmonary clinic 10/27/2015 by Dr Elna Breslow.  10/27/2015 1st [place] office visit/ Bryttany Tortorelli   Chief Complaint  Patient presents with  . Pulmonary Consult    Referred by Mauricio Po, NP. Pt c/o SOB x 2 yrs. He feels SOB with or without any exertion. Sometimes wakes up in the night struggling to catch his breath.   x years activity limited by pain in legs across the parking lot but not sob  For the past year, more episodes of sob  at rest/ more noct awakening smothering / worse bending over assoc with overt hb but no assoc cough  Able to sleep on side horizontal   No obvious other patterns in day to day or daytime variabilty or assoc chronic cough or cp or chest tightness, subjective wheeze or  overt sinus  symptoms. No unusual exp hx or h/o childhood pna/ asthma or knowledge of premature birth.  Sleeping ok without nocturnal  or early am exacerbation  of respiratory  c/o's or need for noct saba. Also denies any obvious fluctuation of symptoms with weather or environmental changes or other aggravating or alleviating factors except as outlined above   Current Medications, Allergies, Complete Past Medical History, Past Surgical History, Family History, and Social History were reviewed in [employer] record.              Review of Systems  Constitutional: Negative for fever, chills, activity change, appetite change and unexpected weight change.  HENT: Negative for congestion, dental problem, postnasal drip, rhinorrhea, sneezing, sore throat, trouble swallowing and voice change.   Eyes: Negative for visual disturbance.  Respiratory: Positive for shortness of breath. Negative for cough and choking.   Cardiovascular: Negative for chest pain and leg swelling.    Gastrointestinal: Negative for nausea, vomiting and abdominal pain.  Genitourinary: Negative for difficulty urinating.       Acid heartburn indigestion  Musculoskeletal: Negative for arthralgias.  Skin: Negative for rash.  Psychiatric/Behavioral: Negative for behavioral problems and confusion.       Objective:   Physical Exam  amb obese wm nad  Wt Readings from Last 3 Encounters:  10/27/15 302 lb 9.6 oz (137.258 kg)  08/11/15 297 lb (134.718 kg)  06/01/15 293 lb (132.904 kg)    Vital signs reviewed   HEENT: nl dentition, turbinates, and oropharynx. Nl external ear canals without cough reflex   NECK :  without JVD/Nodes/TM/ nl carotid upstrokes bilaterally   LUNGS: no acc muscle use,  Nl contour chest which is clear to A and P bilaterally without cough on insp or exp maneuvers   CV:  RRR  no s3 or murmur or increase in P2, no edema   ABD:  soft and nontender with nl inspiratory excursion in the supine position. No bruits or organomegaly, bowel sounds nl  MS:  Nl gait/ ext warm without deformities, calf tenderness, cyanosis or clubbing No obvious joint restrictions   SKIN: warm and dry without lesions    NEURO:  alert, approp, nl sensorium with  no motor deficits        Assessment & Plan:

## 2015-10-27 NOTE — Patient Instructions (Signed)
Stop lisinopril and start avapro (ibesartan ) 300 mg daily instead/ break in half if too strong  Pepcid 20 mg at bedtime  GERD (REFLUX)  is an extremely common cause of respiratory symptoms just like yours , many times with no obvious heartburn at all.    It can be treated with medication, but also with lifestyle changes including elevation of the head of your bed (ideally with 6 inch  bed blocks),  Smoking cessation, avoidance of late meals, excessive alcohol, and avoid fatty foods, chocolate, peppermint, colas, red wine, and acidic juices such as orange juice.  NO MINT OR MENTHOL PRODUCTS SO NO COUGH DROPS  USE SUGARLESS CANDY INSTEAD (Jolley ranchers or Stover's or Life Savers) or even ice chips will also do - the key is to swallow to prevent all throat clearing. NO OIL BASED VITAMINS - use powdered substitutes.    Please schedule a follow up office visit in 4 weeks, sooner if needed

## 2015-10-29 ENCOUNTER — Encounter: Payer: Self-pay | Admitting: Internal Medicine

## 2015-10-29 DIAGNOSIS — E669 Obesity, unspecified: Secondary | ICD-10-CM | POA: Insufficient documentation

## 2015-10-29 NOTE — Assessment & Plan Note (Signed)
Body mass index is 41.03 kg/(m^2).  Lab Results  Component Value Date   TSH 2.252 11/09/2010     Contributing to gerd tendency/ doe/reviewed the need and the process to achieve and maintain neg calorie balance > defer f/u primary care including intermittently monitoring thyroid status

## 2015-10-29 NOTE — Assessment & Plan Note (Addendum)
.  Spirometry 10/27/2015  wnl x minimal curvature to f/v loo  Symptoms are atypical (at rest more than walking) and   disproportionate to objective findings and not clear this is a lung problem but pt does appear to have difficult airway management issues. DDX of  difficult airways management almost all start with A and  include Adherence, Ace Inhibitors, Acid Reflux, Active Sinus Disease, Alpha 1 Antitripsin deficiency, Anxiety masquerading as Airways dz,  ABPA,  Allergy(esp in young), Aspiration (esp in elderly), Adverse effects of meds,  Active smokers, A bunch of PE's (a small clot burden can't cause this syndrome unless there is already severe underlying pulm or vascular dz with poor reserve) plus two Bs  = Bronchiectasis and Beta blocker use..and one C= CHF  Adherence is always the initial "prime suspect" and is a multilayered concern that requires a "trust but verify" approach in every patient - starting with knowing how to use medications, especially inhalers, correctly, keeping up with refills and understanding the fundamental difference between maintenance and prns vs those medications only taken for a very short course and then stopped and not refilled.   ? Acid (or non-acid) GERD > always difficult to exclude as up to 75% of pts in some series report no assoc GI/ Heartburn symptoms and 100% in pts who report them as is the case here > rec max (24h)  acid suppression and diet restrictions/ reviewed and instructions given in writing.  ACEi also at the top of the usual list of suspects > see hbp (see separate a/p)   ? Allergies/ asthma unlikely in absence of symptoms of rhinitis/cough   ? CHF/ diastolic dysfunction needs to be considered if not better on return   I had an extended discussion with the patient reviewing all relevant studies completed to date and  lasting 35 minutes of a 60 minute initial  visit    Each maintenance medication was reviewed in detail including most importantly the  difference between maintenance and prns and under what circumstances the prns are to be triggered using an action plan format that is not reflected in the computer generated alphabetically organized AVS.    Please see instructions for details which were reviewed in writing and the patient given a copy highlighting the part that I personally wrote and discussed at today's ov.

## 2015-10-29 NOTE — Assessment & Plan Note (Signed)
In the best review of chronic cough to date ( NEJM [phone] [id number]) ,  ACEi are now felt to cause cough in up to  20% of pts which is a 4 fold increase from previous reports and does not include the variety of non-specific complaints we see in pulmonary clinic in pts on ACEi but previously attributed to another dx like  Copd/asthma and  include PNDS, throat and chest congestion, "bronchitis", unexplained dyspnea and noct "strangling" sensations, and hoarseness, but also  atypical /refractory GERD symptoms like dysphagia and "bad heartburn"   The only way I know  to prove this is not an "ACEi Case" is a trial off ACEi x a minimum of 4 weeks then regroup.   Try avapro 300 mg daily and reduce by one half if too strong

## 2015-10-31 ENCOUNTER — Telehealth: Payer: Self-pay | Admitting: Family

## 2015-10-31 NOTE — Telephone Encounter (Signed)
Pt is having an attack of diverticulitis right now and he was hoping you can send in an antibiotic to [place]

## 2015-10-31 NOTE — Telephone Encounter (Signed)
Please advise 

## 2015-11-01 MED ORDER — AMOXICILLIN-POT CLAVULANATE 875-125 MG PO TABS: 1.0000 | ORAL_TABLET | Freq: Two times a day (BID) | ORAL | Status: DC

## 2015-11-01 NOTE — Telephone Encounter (Signed)
Pt aware.

## 2015-11-01 NOTE — Addendum Note (Signed)
Addended by: Mauricio Po D on: 11/01/2015 08:18 AM   Modules accepted: Orders

## 2015-11-01 NOTE — Telephone Encounter (Signed)
Augmentin sent to pharmacy.

## 2015-11-01 NOTE — Telephone Encounter (Signed)
Pt called to check on this, please call him back

## 2015-11-22 ENCOUNTER — Encounter: Payer: Self-pay | Admitting: Internal Medicine

## 2015-11-22 ENCOUNTER — Ambulatory Visit (INDEPENDENT_AMBULATORY_CARE_PROVIDER_SITE_OTHER): Payer: PPO | Admitting: Internal Medicine

## 2015-11-22 VITALS — BP 128/86 | HR 67 | Ht 72.0 in | Wt 296.6 lb

## 2015-11-22 DIAGNOSIS — R0602 Shortness of breath: Secondary | ICD-10-CM

## 2015-11-22 DIAGNOSIS — I10 Essential (primary) hypertension: Secondary | ICD-10-CM

## 2015-11-22 NOTE — Patient Instructions (Signed)
In the event that cough/ breathing worsen > pepcid 20 mg after breakfast and bedtime    If you are satisfied with your treatment plan,  let your doctor know and he/she can either refill your medications or you can return here when your prescription runs out.     If in any way you are not 100% satisfied,  please tell us.  If 100% better, tell your friends!  Pulmonary follow up is as needed

## 2015-11-22 NOTE — Assessment & Plan Note (Addendum)
Spirometry 10/27/2015  wnl x minimal curvature to f/v loop  Trial off acei 10/27/2015 > much better 11/22/2015 > pulmonary f/u prn    I had an extended final summary discussion with the patient reviewing all relevant studies completed to date and  lasting 15 to 20 minutes of a 25 minute visit on the following issues:    Todd Weeks is still reproducibly sob with exertion but no longer having any noct symptoms so pulmonary meds aren't needed     I reviewed the Fletcher curve with the patient that basically indicates  if you quit smoking when your best day FEV1 is still well preserved (as is clearly  the case here)  it is highly unlikely you will progress to severe disease and informed the patient there was no medication on the market that has proven to alter the curve/ its downward trajectory  or the likelihood of progression of their disease.  Therefore stopping smoking and maintaining abstinence is the most important aspect of care, not choice of inhalers or for that matter, doctors.    Pulmonary f/u can be prn and ok to try off gerd rx as loses wt but low threshold to resume for resp flare of any origin/ reviewed  Each maintenance medication was reviewed in detail including most importantly the difference between maintenance and as needed and under what circumstances the prns are to be used.  Please see instructions for details which were reviewed in writing and the patient given a copy.

## 2015-11-22 NOTE — Progress Notes (Signed)
Subjective:    Patient ID: Todd Weeks, male    DOB: 09-05-1955     MRN: [id number]   Brief patient profile:  56 yowm quit smoking 2013 with doe @ 72 and wt gain and doe gradually worse since then so referred to pulmonary clinic 10/27/2015 by Dr Elna Breslow.   History of Present Illness  10/27/2015 1st [facility] [place] office visit/ Todd Weeks   Chief Complaint  Patient presents with  . Pulmonary Consult    Referred by Mauricio Po, NP. Pt c/o SOB x 2 yrs. He feels SOB with or without any exertion. Sometimes wakes up in the night struggling to catch his breath.   x years activity limited by pain in legs across the parking lot but not sob  For the past year, more episodes of sob  at rest/ more noct awakening smothering / worse bending over assoc with overt hb but no assoc cough  Able to sleep on side horizontal  rec Stop lisinopril and start avapro (ibesartan ) 300 mg daily instead/ break in half if too strong Pepcid 20 mg at bedtime GERD diet    11/22/2015  f/u ov/Todd Weeks re: cough /sob with no significant airflow obst on PFT's 10/27/15  Chief Complaint  Patient presents with  . Follow-up    Feeling better 75%,sob-better.Sleeping better.  on pepcid at hs and off acei and no cough/ only doe = MMRC1 = can walk nl pace, flat grade, can't hurry or go uphills or steps s sob    No obvious day to day or daytime variability or assoc excess/ purulent sputum or mucus plugs or hemoptysis or cp or chest tightness, subjective wheeze or overt sinus or hb symptoms. No unusual exp hx or h/o childhood pna/ asthma or knowledge of premature birth.  Sleeping ok without nocturnal  or early am exacerbation  of respiratory  c/o's or need for noct saba. Also denies any obvious fluctuation of symptoms with weather or environmental changes or other aggravating or alleviating factors except as outlined above   Current Medications, Allergies, Complete Past Medical History, Past Surgical History, Family History, and Social  History were reviewed in [employer] record.  ROS  The following are not active complaints unless bolded sore throat, dysphagia, dental problems, itching, sneezing,  nasal congestion or excess/ purulent secretions, ear ache,   fever, chills, sweats, unintended wt loss, classically pleuritic or exertional cp,  orthopnea pnd or leg swelling, presyncope, palpitations, abdominal pain, anorexia, nausea, vomiting, diarrhea  or change in bowel or bladder habits, change in stools or urine, dysuria,hematuria,  rash, arthralgias, visual complaints, headache, numbness, weakness or ataxia or problems with walking or coordination,  change in mood/affect or memory.                       Objective:   Physical Exam  amb obese wm nad  11/22/2015        297   10/27/15 302 lb 9.6 oz (137.258 kg)  08/11/15 297 lb (134.718 kg)  06/01/15 293 lb (132.904 kg)    Vital signs reviewed   HEENT: nl dentition, turbinates, and oropharynx. Nl external ear canals without cough reflex   NECK :  without JVD/Nodes/TM/ nl carotid upstrokes bilaterally   LUNGS: no acc muscle use,  Nl contour chest which is clear to A and P bilaterally without cough on insp or exp maneuvers   CV:  RRR  no s3 or murmur or increase in P2, no  edema   ABD:  soft and nontender with nl inspiratory excursion in the supine position. No bruits or organomegaly, bowel sounds nl  MS:  Nl gait/ ext warm without deformities, calf tenderness, cyanosis or clubbing No obvious joint restrictions   SKIN: warm and dry without lesions    NEURO:  alert, approp, nl sensorium with  no motor deficits        Assessment & Plan:

## 2015-11-22 NOTE — Assessment & Plan Note (Signed)
Body mass index is 40.22 trending down now  Lab Results  Component Value Date   TSH 2.252 11/09/2010     Contributing to gerd tendency/ doe/reviewed the need and the process to achieve and maintain neg calorie balance > defer f/u primary care including intermittently monitoring thyroid status

## 2015-11-22 NOTE — Assessment & Plan Note (Addendum)
Trial off acei 10/27/2015 > due to sob/cough noct spells > resolved 11/22/2015    NB: Although even in retrospect it may not be clear the ACEi contributed to the pt's symptoms,  Pt improved off them and adding them back at this point or in the future would risk confusion in interpretation of non-specific respiratory symptoms to which this patient is prone  ie  Better not to muddy the waters here.   bp well controlled on avapro 300 mg daily > Follow up per Primary Care planned

## 2016-01-18 ENCOUNTER — Ambulatory Visit (INDEPENDENT_AMBULATORY_CARE_PROVIDER_SITE_OTHER): Payer: PPO | Admitting: Family

## 2016-01-18 ENCOUNTER — Encounter: Payer: Self-pay | Admitting: Family

## 2016-01-18 VITALS — BP 124/76 | HR 78 | Temp 98.0°F | Resp 16 | Ht 72.0 in | Wt 296.0 lb

## 2016-01-18 DIAGNOSIS — J3489 Other specified disorders of nose and nasal sinuses: Secondary | ICD-10-CM | POA: Diagnosis not present

## 2016-01-18 NOTE — Patient Instructions (Signed)
Thank you for choosing [facility].  Summary/Instructions:  They will call to schedule your appointment with ENT.  Referrals have been made during this visit. You should expect to hear back from our schedulers in about 7-10 days in regards to establishing an appointment with the specialists we discussed.   If your symptoms worsen or fail to improve, please contact our office for further instruction, or in case of emergency go directly to the emergency room at the closest medical facility.

## 2016-01-18 NOTE — Assessment & Plan Note (Signed)
Symptoms and exam consistent with lesion or mass of paraspinal sinuses. Not painful. Will refer to ENT for further evaluation and possible imaging. Continue to monitor at this time pending referral to ENT.

## 2016-01-18 NOTE — Progress Notes (Signed)
Pre visit review using our clinic review tool, if applicable. No additional management support is needed unless otherwise documented below in the visit note. 

## 2016-01-18 NOTE — Progress Notes (Signed)
Subjective:    Patient ID: Todd Weeks, male    DOB: September 01, 1955, 60 y.o.   MRN: [id number]  Chief Complaint  Patient presents with  . Mass    has a mass or cyst on the right side of his face that has been there for a while, gets bigger and smaller    HPI:  Todd Weeks is a 60 y.o. male who  has a past medical history of HTN (hypertension); Diverticulitis; PVD (peripheral vascular disease) ([place]); Chest pain; Headache(784.0); Light-headedness; Shortness of breath; Calf cramp; Fatigue; Depression; and Sexual dysfunction. and presents today for an acute of visit.  This is a new problem. Associated symptom of a mass/cyst located on the right side of his face has been going on for about 3-4 months. Reports that it continues to change in size getting bigger and smaller. Denies fevers. Described as coming to a head on occasion with no discharge. Denies any modifying factors that make it better or worse.  No Known Allergies   Current Outpatient Prescriptions on File Prior to Visit  Medication Sig Dispense Refill  . cholecalciferol (VITAMIN D) 1000 units tablet Take 1,000 Units by mouth 2 times daily at 12 noon and 4 pm.    . clopidogrel (PLAVIX) 75 MG tablet TAKE ONE TABLET BY MOUTH ONCE DAILY. 30 tablet 5  . famotidine (PEPCID) 20 MG tablet One at bedtime    . irbesartan (AVAPRO) 300 MG tablet Take 1 tablet (300 mg total) by mouth daily. 30 tablet 11  . Multiple Vitamins-Minerals (MULTIVITAMIN WITH MINERALS) tablet Take 1 tablet by mouth daily.    . simvastatin (ZOCOR) 20 MG tablet TAKE 1 TABLET BY MOUTH AT BEDTIME FOR CHOLESTEROL. 30 tablet 5   No current facility-administered medications on file prior to visit.     Past Surgical History  Procedure Laterality Date  . Femoral bypass        Review of Systems  Constitutional: Negative for fever and chills.  HENT: Positive for facial swelling. Negative for congestion, sinus pressure, sore throat and trouble swallowing.     Respiratory: Negative for chest tightness and shortness of breath.       Objective:    BP 124/76 mmHg  Pulse 78  Temp(Src) 98 F (36.7 C) (Oral)  Resp 16  Ht 6' (1.829 m)  Wt 296 lb (134.265 kg)  BMI 40.14 kg/m2  SpO2 95% Nursing note and vital signs reviewed.  Physical Exam  Constitutional: He is oriented to person, place, and time. He appears well-developed and well-nourished. No distress.  HENT:  Firm lesion/mass that is mobile and non-tender located over the maxillary sinus on the right side.   Cardiovascular: Normal rate, regular rhythm, normal heart sounds and intact distal pulses.   Pulmonary/Chest: Effort normal and breath sounds normal.  Neurological: He is alert and oriented to person, place, and time.  Skin: Skin is warm and dry.  Psychiatric: He has a normal mood and affect. His behavior is normal. Judgment and thought content normal.       Assessment & Plan:   Problem List Items Addressed This Visit      Respiratory   Lesion or mass of paranasal sinuses - Primary    Symptoms and exam consistent with lesion or mass of paraspinal sinuses. Not painful. Will refer to ENT for further evaluation and possible imaging. Continue to monitor at this time pending referral to ENT.       Relevant Orders   Ambulatory referral to  ENT       I am having Mr. Ferman maintain his simvastatin, multivitamin with minerals, cholecalciferol, clopidogrel, famotidine, and irbesartan.   Follow-up: Return if symptoms worsen or fail to improve.  Mauricio Po, FNP

## 2016-01-31 ENCOUNTER — Other Ambulatory Visit: Payer: Self-pay | Admitting: Family

## 2016-02-21 DIAGNOSIS — R21 Rash and other nonspecific skin eruption: Secondary | ICD-10-CM | POA: Diagnosis not present

## 2016-04-04 ENCOUNTER — Other Ambulatory Visit: Payer: Self-pay | Admitting: Family

## 2016-05-10 ENCOUNTER — Other Ambulatory Visit: Payer: Self-pay | Admitting: Family

## 2016-05-18 ENCOUNTER — Ambulatory Visit (HOSPITAL_COMMUNITY)
Admission: EM | Admit: 2016-05-18 | Discharge: 2016-05-18 | Disposition: A | Discharge status: Home / Self Care | Payer: PPO | Attending: Family Medicine | Admitting: Family Medicine

## 2016-05-18 ENCOUNTER — Encounter (HOSPITAL_COMMUNITY): Payer: Self-pay | Admitting: Emergency Medicine

## 2016-05-18 DIAGNOSIS — G510 Bell's palsy: Secondary | ICD-10-CM

## 2016-05-18 MED ORDER — TRIAMCINOLONE ACETONIDE 40 MG/ML IJ SUSP
40.0000 mg | Freq: Once | INTRAMUSCULAR | Status: AC
  Administered 2016-05-18: 40 mg via INTRAMUSCULAR

## 2016-05-18 MED ORDER — TRIAMCINOLONE ACETONIDE 40 MG/ML IJ SUSP
INTRAMUSCULAR | Status: AC
  Filled 2016-05-18: qty 1

## 2016-05-18 MED ORDER — METHYLPREDNISOLONE SODIUM SUCC 125 MG IJ SOLR
INTRAMUSCULAR | Status: AC
  Filled 2016-05-18: qty 2

## 2016-05-18 MED ORDER — METHYLPREDNISOLONE SODIUM SUCC 125 MG IJ SOLR
125.0000 mg | Freq: Once | INTRAMUSCULAR | Status: AC
  Administered 2016-05-18: 125 mg via INTRAMUSCULAR

## 2016-05-18 MED ORDER — METHYLPREDNISOLONE 4 MG PO TBPK: ORAL_TABLET | ORAL | 1 refills | Status: DC

## 2016-05-18 NOTE — Discharge Instructions (Signed)
Use eye drops during day and ointment at bedtime as directed.

## 2016-05-18 NOTE — ED Provider Notes (Signed)
[place]    CSN: [id number] Arrival date & time: 05/18/16  1036     History   Chief Complaint Chief Complaint  Patient presents with  . Facial Droop    HPI Todd Weeks is a 60 y.o. male.   The history is provided by the patient.  Neurologic Problem  This is a new problem. The current episode started more than 2 days ago (decreased tongue taste 3-4 days and facial droop since yest.). The problem has been gradually worsening. Pertinent negatives include no headaches. The symptoms are aggravated by stress.    Past Medical History:  Diagnosis Date  . Calf cramp   . Chest pain   . Depression   . Diverticulitis   . Fatigue   . Headache(784.0)   . HTN (hypertension)   . Light-headedness   . PVD (peripheral vascular disease) ([place])   . Sexual dysfunction   . Shortness of breath     Patient Active Problem List   Diagnosis Date Noted  . Lesion or mass of paranasal sinuses 01/18/2016  . Severe obesity (BMI >= 40) ([place]) 10/29/2015  . Subacromial bursitis 06/01/2015  . Hyperlipidemia 05/04/2015  . Right shoulder pain 05/04/2015  . Mass of thigh 05/04/2015  . Atherosclerosis of native arteries of extremity with intermittent claudication ([place]) 05/22/2012  . CAD (coronary artery disease) 02/02/2011  . Abnormal cardiovascular function study 01/04/2011  . CHEST PAIN 11/09/2010  . EMBOLISM&THROMBOSIS ARTERIES LOWER EXTREMITY 09/15/2007  . PROSTATE SPECIFIC ANTIGEN, ELEVATED 08/06/2007  . TOBACCO ABUSE 08/01/2007  . HYPERTENSION, BENIGN ESSENTIAL 08/01/2007  . PERIPHERAL VASCULAR DISEASE 08/01/2007  . Shortness of breath 08/01/2007    Past Surgical History:  Procedure Laterality Date  . FEMORAL BYPASS         Home Medications    Prior to Admission medications   Medication Sig Start Date End Date Taking? Authorizing Provider  clopidogrel (PLAVIX) 75 MG tablet TAKE ONE TABLET BY MOUTH ONCE DAILY. 05/11/16  Yes Golden Circle, FNP  LOSARTAN POTASSIUM PO  Take by mouth.   Yes Historical Provider, MD  Multiple Vitamins-Minerals (MULTIVITAMIN WITH MINERALS) tablet Take 1 tablet by mouth daily.   Yes Historical Provider, MD  simvastatin (ZOCOR) 20 MG tablet TAKE 1 TABLET BY MOUTH AT BEDTIME FOR CHOLESTEROL. 05/11/16  Yes Golden Circle, FNP  cholecalciferol (VITAMIN D) 1000 units tablet Take 1,000 Units by mouth 2 times daily at 12 noon and 4 pm.    Historical Provider, MD  famotidine (PEPCID) 20 MG tablet One at bedtime 10/27/15   Tanda Rockers, MD  irbesartan (AVAPRO) 300 MG tablet Take 1 tablet (300 mg total) by mouth daily. 10/27/15   Tanda Rockers, MD    Family History Family History  Problem Relation Age of Onset  . Hypertension Mother   . Deep vein thrombosis Father   . Diabetes Brother   . Tuberculosis Maternal Grandfather   . Emphysema Maternal Grandfather     smoked  . Lung disease Brother     smoked  . Asthma Son     Social History Social History  Substance Use Topics  . Smoking status: Former Smoker    Packs/day: 1.50    Years: 46.00    Types: Cigarettes    Quit date: 12/12/2011  . Smokeless tobacco: Never Used  . Alcohol use 0.6 oz/week    1 Cans of beer per week     Comment: couple of beers a weekn     Allergies  Review of patient's allergies indicates no known allergies.   Review of Systems Review of Systems  Constitutional: Negative.   HENT: Negative.   Respiratory: Negative.   Neurological: Positive for facial asymmetry, speech difficulty and numbness. Negative for dizziness, tremors, syncope, weakness and headaches.  All other systems reviewed and are negative.    Physical Exam Triage Vital Signs ED Triage Vitals [05/18/16 1058]  Enc Vitals Group     BP 160/76     Pulse Rate 77     Resp 12     Temp 98.6 F (37 C)     Temp Source Oral     SpO2 98 %     Weight      Height      Head Circumference      Peak Flow      Pain Score      Pain Loc      Pain Edu?      Excl. in [place]?    No data  found.   Updated Vital Signs BP 160/76 (BP Location: Left Arm)   Pulse 77   Temp 98.6 F (37 C) (Oral)   Resp 12   SpO2 98%   Visual Acuity Right Eye Distance:   Left Eye Distance:   Bilateral Distance:    Right Eye Near:   Left Eye Near:    Bilateral Near:     Physical Exam  Constitutional: He is oriented to person, place, and time. He appears well-developed and well-nourished. No distress.  HENT:  Right Ear: External ear normal.  Left Ear: External ear normal.  Eyes: Pupils are equal, round, and reactive to light.  Neck: Normal range of motion. Neck supple.  Cardiovascular: Normal rate.   Pulmonary/Chest: Effort normal.  Musculoskeletal: Normal range of motion.  Lymphadenopathy:    He has no cervical adenopathy.  Neurological: He is alert and oriented to person, place, and time.  Right facial weakness, near complete eye closure right, no ext weakness., speech near nl., no periauricular sx. No dizziness.eoms nl.  Skin: Skin is warm and dry.  Nursing note and vitals reviewed.    UC Treatments / Results  Labs (all labs ordered are listed, but only abnormal results are displayed) Labs Reviewed - No data to display  EKG  EKG Interpretation None       Radiology No results found.  Procedures Procedures (including critical care time)  Medications Ordered in UC Medications  triamcinolone acetonide (KENALOG-40) injection 40 mg (not administered)  methylPREDNISolone sodium succinate (SOLU-MEDROL) 125 mg/2 mL injection 125 mg (not administered)     Initial Impression / Assessment and Plan / UC Course  I have reviewed the triage vital signs and the nursing notes.  Pertinent labs & imaging results that were available during my care of the patient were reviewed by me and considered in my medical decision making (see chart for details).  Clinical Course      Final Clinical Impressions(s) / UC Diagnoses   Final diagnoses:  None    New  Prescriptions New Prescriptions   No medications on file     Billy Fischer, MD 05/18/16 1202

## 2016-05-18 NOTE — ED Triage Notes (Signed)
Pt here for right sided facial droop onset 3-4 days associated w/facial numbness and loss of gustation  MAE x4; bilateral equal strength  Denies dyspnea/SOB, trouble swallowing  A&O x4... NAD

## 2016-05-23 ENCOUNTER — Telehealth: Payer: Self-pay | Admitting: Family

## 2016-05-30 ENCOUNTER — Ambulatory Visit: Payer: PPO | Admitting: Family

## 2016-06-15 ENCOUNTER — Other Ambulatory Visit: Payer: Self-pay | Admitting: Family

## 2016-07-19 ENCOUNTER — Telehealth: Payer: Self-pay | Admitting: Family

## 2016-07-19 MED ORDER — SIMVASTATIN 20 MG PO TABS: 20.0000 mg | ORAL_TABLET | Freq: Every day | ORAL | 0 refills | Status: DC

## 2016-07-19 MED ORDER — CLOPIDOGREL BISULFATE 75 MG PO TABS: 75.0000 mg | ORAL_TABLET | Freq: Every day | ORAL | 0 refills | Status: DC

## 2016-07-19 NOTE — Telephone Encounter (Signed)
Done

## 2016-07-19 NOTE — Telephone Encounter (Signed)
We are having to reschedule patient appt for tomorrow.  Patient is currently out of medication.  Can we send script for plavix and zocor to [facility].

## 2016-07-20 ENCOUNTER — Encounter: Payer: Self-pay | Admitting: Family

## 2016-07-20 ENCOUNTER — Ambulatory Visit: Payer: PPO | Admitting: Family

## 2016-07-20 DIAGNOSIS — E782 Mixed hyperlipidemia: Secondary | ICD-10-CM

## 2016-07-20 DIAGNOSIS — I10 Essential (primary) hypertension: Secondary | ICD-10-CM

## 2016-07-20 DIAGNOSIS — R972 Elevated prostate specific antigen [PSA]: Secondary | ICD-10-CM

## 2016-08-01 ENCOUNTER — Encounter: Payer: Self-pay | Admitting: Family

## 2016-08-01 ENCOUNTER — Other Ambulatory Visit (INDEPENDENT_AMBULATORY_CARE_PROVIDER_SITE_OTHER): Payer: PPO

## 2016-08-01 DIAGNOSIS — I10 Essential (primary) hypertension: Secondary | ICD-10-CM

## 2016-08-01 DIAGNOSIS — R972 Elevated prostate specific antigen [PSA]: Secondary | ICD-10-CM

## 2016-08-01 DIAGNOSIS — E782 Mixed hyperlipidemia: Secondary | ICD-10-CM | POA: Diagnosis not present

## 2016-08-01 LAB — COMPREHENSIVE METABOLIC PANEL
ALK PHOS: 75 U/L (ref 39–117)
ALT: 26 U/L (ref 0–53)
AST: 16 U/L (ref 0–37)
Albumin: 4.4 g/dL (ref 3.5–5.2)
BUN: 13 mg/dL (ref 6–23)
CHLORIDE: 102 meq/L (ref 96–112)
CO2: 30 mEq/L (ref 19–32)
Calcium: 9.1 mg/dL (ref 8.4–10.5)
Creatinine, Ser: 0.68 mg/dL (ref 0.40–1.50)
GFR: 125.64 mL/min (ref >60.00)
GLUCOSE: 98 mg/dL (ref 70–99)
POTASSIUM: 4.5 meq/L (ref 3.5–5.1)
Sodium: 140 mEq/L (ref 135–145)
TOTAL PROTEIN: 7 g/dL (ref 6.0–8.3)
Total Bilirubin: 0.5 mg/dL (ref 0.2–1.2)

## 2016-08-01 LAB — CBC
HCT: 45.3 % (ref 39.0–52.0)
HEMOGLOBIN: 15.3 g/dL (ref 13.0–17.0)
MCHC: 33.7 g/dL (ref 30.0–36.0)
MCV: 87.9 fl (ref 78.0–100.0)
PLATELETS: 256 10*3/uL (ref 150.0–400.0)
RBC: 5.16 Mil/uL (ref 4.22–5.81)
RDW: 13.6 % (ref 11.5–15.5)
WBC: 10.1 10*3/uL (ref 4.0–10.5)

## 2016-08-01 LAB — LIPID PANEL
CHOL/HDL RATIO: 4
Cholesterol: 178 mg/dL (ref 0–200)
HDL: 43.2 mg/dL (ref >39.00)
LDL CALC: 101 mg/dL — AB (ref 0–99)
NonHDL: 135.02
TRIGLYCERIDES: 172 mg/dL — AB (ref 0.0–149.0)
VLDL: 34.4 mg/dL (ref 0.0–40.0)

## 2016-08-01 LAB — PSA: PSA: 24.4 ng/mL — ABNORMAL HIGH (ref 0.10–4.00)

## 2016-08-01 NOTE — Progress Notes (Signed)
Pre visit review using our clinic review tool, if applicable. No additional management support is needed unless otherwise documented below in the visit note. 

## 2016-08-01 NOTE — Progress Notes (Addendum)
Subjective:   Todd Weeks is a 60 y.o. male who presents for Medicare Annual/Subsequent preventive examination.  The Patient was informed that the wellness visit is to identify future health risk and educate and initiate measures that can reduce risk for increased disease through the lifespan.   Describes health as fair, good or great? "fair"  Review of Systems:  No ROS.  Medicare Wellness Visit.  Cardiac Risk Factors include: family history of premature cardiovascular disease;dyslipidemia;hypertension;male gender   Sleep patterns: sleep 4-6 hours, up to void x 4.  Home Safety/Smoke Alarms:  Smoke detectors in place.  Living environment; residence and Firearm Safety: Lives alone in single story home. Feels safe. Firearms locked away.  Seat Belt Safety/Bike Helmet: Wears seat belt.    Counseling:   Eye Exam-Last exam 2014, followed by Wynetta Emery. Will make appt Dental-Last exam >10 years. Dental resources provided.   Male:   CCS-colonoscopy pt reports <5 years, [facility]. Recall 10 years. Will obtain records.        PSA-08/01/16, 24. Followed by [facility]. Will make appt.        Objective:    Vitals: BP (!) 148/80 (BP Location: Left Arm, Patient Position: Sitting, Cuff Size: Large)   Pulse 79   Ht 6' (1.829 m)   Wt (!) 301 lb 1.3 oz (136.6 kg)   SpO2 96%   BMI 40.83 kg/m   Body mass index is 40.83 kg/m.  Tobacco History  Smoking Status  . Former Smoker  . Packs/day: 1.50  . Years: 46.00  . Types: Cigarettes  . Quit date: 12/12/2011  Smokeless Tobacco  . Never Used     Counseling given: Not Answered   Past Medical History:  Diagnosis Date  . Bell's palsy   . Calf cramp   . Chest pain   . Depression   . Diverticulitis   . Fatigue   . Headache(784.0)   . HTN (hypertension)   . Light-headedness   . PVD (peripheral vascular disease) ([place])   . Sexual dysfunction   . Shortness of breath    Past Surgical History:  Procedure Laterality Date  . FEMORAL  BYPASS     Family History  Problem Relation Age of Onset  . Hypertension Mother   . Deep vein thrombosis Father   . Diabetes Brother   . Tuberculosis Maternal Grandfather   . Emphysema Maternal Grandfather     smoked  . Lung disease Brother     smoked  . Asthma Son    History  Sexual Activity  . Sexual activity: Not on file    Outpatient Encounter Prescriptions as of 08/02/2016  Medication Sig  . clopidogrel (PLAVIX) 75 MG tablet Take 1 tablet (75 mg total) by mouth daily.  . irbesartan (AVAPRO) 300 MG tablet Take 1 tablet (300 mg total) by mouth daily.  . Multiple Vitamins-Minerals (MULTIVITAMIN WITH MINERALS) tablet Take 1 tablet by mouth daily.  . simvastatin (ZOCOR) 20 MG tablet Take 1 tablet (20 mg total) by mouth daily at 6 PM. Yearly physical w/labs is due must see Marya Amsler for refills  . cholecalciferol (VITAMIN D) 1000 units tablet Take 1,000 Units by mouth 2 times daily at 12 noon and 4 pm.  . famotidine (PEPCID) 20 MG tablet One at bedtime (Patient not taking: Reported on 08/02/2016)  . LOSARTAN POTASSIUM PO Take by mouth.  . methylPREDNISolone (MEDROL DOSEPAK) 4 MG TBPK tablet follow package directions start on sat., take until finished (Patient not taking: Reported on  08/02/2016)   No facility-administered encounter medications on file as of 08/02/2016.     Activities of Daily Living In your present state of health, do you have any difficulty performing the following activities: 08/02/2016  Hearing? N  Vision? N  Difficulty concentrating or making decisions? N  Walking or climbing stairs? N  Dressing or bathing? N  Doing errands, shopping? N  Preparing Food and eating ? N  Using the Toilet? N  In the past six months, have you accidently leaked urine? N  Do you have problems with loss of bowel control? N  Managing your Medications? N  Managing your Finances? N  Housekeeping or managing your Housekeeping? N  Some recent data might be hidden    Patient Care  Team: Golden Circle, FNP as PCP - General (Family Medicine) Burnell Blanks, MD as Consulting Physician (Cardiology) Elam Dutch, MD as Consulting Physician (Vascular Surgery) Deneise Lever, MD as Consulting Physician (Pulmonary Disease) Soyla Dryer, PA-C (Physician Assistant) Franchot Gallo, MD as Consulting Physician (Urology)   Assessment:    Physical assessment deferred to PCP.  Exercise Activities and Dietary recommendations Current Exercise Habits: The patient does not participate in regular exercise at present [facility] and yard work), Exercise limited by: orthopedic condition(s);neurologic condition(s)   Diet (meal preparation, eat out, water intake, caffeinated beverages, dairy products, fruits and vegetables): 50/50. Drinks water, milk, gatorade and G2.   Breakfast: Eggs, white omelet, veggies (late) Lunch: skips Dinner: meat, vegetables, hamburgers, pasta  Encouraged to make healthy food options and increase activity.   Goals    . Weight (lb) < 260 lb (117.9 kg)          Would like to decrease weight to ultimate goal of 220 by increasing activity (joining [employer]).       Fall Risk Fall Risk  08/02/2016 08/11/2015 12/13/2014  Falls in the past year? No No No   Depression Screen PHQ 2/9 Scores 08/02/2016 08/11/2015 12/13/2014  PHQ - 2 Score 2 3 0  PHQ- 9 Score 12 13 -    Cognitive Function MMSE - Mini Mental State Exam 12/13/2014  Not completed: Unable to complete       Ad8 score reviewed for issues:  Issues making decisions:no  Less interest in hobbies / activities:no  Repeats questions, stories (family complaining):no  Trouble using ordinary gadgets (microwave, computer, phone):no  Forgets the month or year: no  Mismanaging finances: no  Remembering appts:no  Daily problems with thinking and/or memory:no Ad8 score is=0     Immunization History  Administered Date(s) Administered  . Influenza Split 05/14/2015  . Tdap  12/13/2014   Screening Tests Health Maintenance  Topic Date Due  . Hepatitis C Screening  10/26/1955  . COLONOSCOPY  09/18/2004  . ZOSTAVAX  09/18/2014  . INFLUENZA VACCINE  03/13/2016  . TETANUS/TDAP  12/12/2024  . HIV Screening  Completed   Flu Vaccine administered today.  Hepatitis C Screening ordered today.     Plan:     Eat heart healthy diet (full of fruits, vegetables, whole grains, lean protein, water--limit salt, fat, and sugar intake) and increase physical activity as tolerated.  Continue doing brain stimulating activities (puzzles, reading, adult coloring books, staying active) to keep memory sharp.   Bring a copy of your advance directives to your next office visit.  Make appointment with Eye doctor and Urology.   During the course of the visit the patient was educated and counseled about the following appropriate screening and preventive  services:   Vaccines to include Pneumoccal, Influenza, Hepatitis B, Td, Zostavax, HCV  Cardiovascular Disease  Colorectal cancer screening  Diabetes screening  Prostate Cancer Screening  Glaucoma screening  Nutrition counseling    Patient Instructions (the written plan) was given to the patient.    Gerilyn Nestle, RN  08/02/2016   Medical screening examination/treatment/procedure(s) were performed by non-physician practitioner and as supervising provider I was immediately available for consultation/collaboration. I agree with treatment plan. Mauricio Po, FNP

## 2016-08-02 ENCOUNTER — Other Ambulatory Visit: Payer: PPO

## 2016-08-02 ENCOUNTER — Ambulatory Visit (INDEPENDENT_AMBULATORY_CARE_PROVIDER_SITE_OTHER): Payer: PPO

## 2016-08-02 VITALS — BP 148/80 | HR 79 | Ht 72.0 in | Wt 301.1 lb

## 2016-08-02 DIAGNOSIS — Z0000 Encounter for general adult medical examination without abnormal findings: Secondary | ICD-10-CM

## 2016-08-02 DIAGNOSIS — Z1159 Encounter for screening for other viral diseases: Secondary | ICD-10-CM | POA: Diagnosis not present

## 2016-08-02 DIAGNOSIS — Z23 Encounter for immunization: Secondary | ICD-10-CM | POA: Diagnosis not present

## 2016-08-02 DIAGNOSIS — Z7289 Other problems related to lifestyle: Secondary | ICD-10-CM | POA: Diagnosis not present

## 2016-08-02 NOTE — Patient Instructions (Addendum)
Eat heart healthy diet (full of fruits, vegetables, whole grains, lean protein, water--limit salt, fat, and sugar intake) and increase physical activity as tolerated.  Continue doing brain stimulating activities (puzzles, reading, adult coloring books, staying active) to keep memory sharp.   Bring a copy of your advance directives to your next office visit.  Make appointment with Eye doctor and Urology.  Fall Prevention in the Home Introduction Falls can cause injuries. They can happen to people of all ages. There are many things you can do to make your home safe and to help prevent falls. What can I do on the outside of my home?  Regularly fix the edges of walkways and driveways and fix any cracks.  Remove anything that might make you trip as you walk through a door, such as a raised step or threshold.  Trim any bushes or trees on the path to your home.  Use bright outdoor lighting.  Clear any walking paths of anything that might make someone trip, such as rocks or tools.  Regularly check to see if handrails are loose or broken. Make sure that both sides of any steps have handrails.  Any raised decks and porches should have guardrails on the edges.  Have any leaves, snow, or ice cleared regularly.  Use sand or salt on walking paths during winter.  Clean up any spills in your garage right away. This includes oil or grease spills. What can I do in the bathroom?  Use night lights.  Install grab bars by the toilet and in the tub and shower. Do not use towel bars as grab bars.  Use non-skid mats or decals in the tub or shower.  If you need to sit down in the shower, use a plastic, non-slip stool.  Keep the floor dry. Clean up any water that spills on the floor as soon as it happens.  Remove soap buildup in the tub or shower regularly.  Attach bath mats securely with double-sided non-slip rug tape.  Do not have throw rugs and other things on the floor that can make you  trip. What can I do in the bedroom?  Use night lights.  Make sure that you have a light by your bed that is easy to reach.  Do not use any sheets or blankets that are too big for your bed. They should not hang down onto the floor.  Have a firm chair that has side arms. You can use this for support while you get dressed.  Do not have throw rugs and other things on the floor that can make you trip. What can I do in the kitchen?  Clean up any spills right away.  Avoid walking on wet floors.  Keep items that you use a lot in easy-to-reach places.  If you need to reach something above you, use a strong step stool that has a grab bar.  Keep electrical cords out of the way.  Do not use floor polish or wax that makes floors slippery. If you must use wax, use non-skid floor wax.  Do not have throw rugs and other things on the floor that can make you trip. What can I do with my stairs?  Do not leave any items on the stairs.  Make sure that there are handrails on both sides of the stairs and use them. Fix handrails that are broken or loose. Make sure that handrails are as Jonas as the stairways.  Check any carpeting to make sure that it  is firmly attached to the stairs. Fix any carpet that is loose or worn.  Avoid having throw rugs at the top or bottom of the stairs. If you do have throw rugs, attach them to the floor with carpet tape.  Make sure that you have a light switch at the top of the stairs and the bottom of the stairs. If you do not have them, ask someone to add them for you. What else can I do to help prevent falls?  Wear shoes that:  Do not have high heels.  Have rubber bottoms.  Are comfortable and fit you well.  Are closed at the toe. Do not wear sandals.  If you use a stepladder:  Make sure that it is fully opened. Do not climb a closed stepladder.  Make sure that both sides of the stepladder are locked into place.  Ask someone to hold it for you, if  possible.  Clearly mark and make sure that you can see:  Any grab bars or handrails.  First and last steps.  Where the edge of each step is.  Use tools that help you move around (mobility aids) if they are needed. These include:  Canes.  Walkers.  Scooters.  Crutches.  Turn on the lights when you go into a dark area. Replace any light bulbs as soon as they burn out.  Set up your furniture so you have a clear path. Avoid moving your furniture around.  If any of your floors are uneven, fix them.  If there are any pets around you, be aware of where they are.  Review your medicines with your doctor. Some medicines can make you feel dizzy. This can increase your chance of falling. Ask your doctor what other things that you can do to help prevent falls. This information is not intended to replace advice given to you by your health care provider. Make sure you discuss any questions you have with your health care provider. Document Released: 05/26/2009 Document Revised: 01/05/2016 Document Reviewed: 09/03/2014  2017 Elsevier  Health Maintenance, Male A healthy lifestyle and preventative care can promote health and wellness.  Maintain regular health, dental, and eye exams.  Eat a healthy diet. Foods like vegetables, fruits, whole grains, low-fat dairy products, and lean protein foods contain the nutrients you need and are low in calories. Decrease your intake of foods high in solid fats, added sugars, and salt. Get information about a proper diet from your health care provider, if necessary.  Regular physical exercise is one of the most important things you can do for your health. Most adults should get at least 150 minutes of moderate-intensity exercise (any activity that increases your heart rate and causes you to sweat) each week. In addition, most adults need muscle-strengthening exercises on 2 or more days a week.   Maintain a healthy weight. The body mass index (BMI) is a  screening tool to identify possible weight problems. It provides an estimate of body fat based on height and weight. Your health care provider can find your BMI and can help you achieve or maintain a healthy weight. For males 20 years and older:  A BMI below 18.5 is considered underweight.  A BMI of 18.5 to 24.9 is normal.  A BMI of 25 to 29.9 is considered overweight.  A BMI of 30 and above is considered obese.  Maintain normal blood lipids and cholesterol by exercising and minimizing your intake of saturated fat. Eat a balanced diet with plenty of  fruits and vegetables. Blood tests for lipids and cholesterol should begin at age 67 and be repeated every 5 years. If your lipid or cholesterol levels are high, you are over age 84, or you are at high risk for heart disease, you may need your cholesterol levels checked more frequently.Ongoing high lipid and cholesterol levels should be treated with medicines if diet and exercise are not working.  If you smoke, find out from your health care provider how to quit. If you do not use tobacco, do not start.  Lung cancer screening is recommended for adults aged 48-80 years who are at high risk for developing lung cancer because of a history of smoking. A yearly low-dose CT scan of the lungs is recommended for people who have at least a 30-pack-year history of smoking and are current smokers or have quit within the past 15 years. A pack year of smoking is smoking an average of 1 pack of cigarettes a day for 1 year (for example, a 30-pack-year history of smoking could mean smoking 1 pack a day for 30 years or 2 packs a day for 15 years). Yearly screening should continue until the smoker has stopped smoking for at least 15 years. Yearly screening should be stopped for people who develop a health problem that would prevent them from having lung cancer treatment.  If you choose to drink alcohol, do not have more than 2 drinks per day. One drink is considered to be  12 oz (360 mL) of beer, 5 oz (150 mL) of wine, or 1.5 oz (45 mL) of liquor.  Avoid the use of street drugs. Do not share needles with anyone. Ask for help if you need support or instructions about stopping the use of drugs.  High blood pressure causes heart disease and increases the risk of stroke. High blood pressure is more likely to develop in:  People who have blood pressure in the end of the normal range (100-139/85-89 mm Hg).  People who are overweight or obese.  People who are African American.  If you are 30-46 years of age, have your blood pressure checked every 3-5 years. If you are 31 years of age or older, have your blood pressure checked every year. You should have your blood pressure measured twice-once when you are at a hospital or clinic, and once when you are not at a hospital or clinic. Record the average of the two measurements. To check your blood pressure when you are not at a hospital or clinic, you can use:  An automated blood pressure machine at a pharmacy.  A home blood pressure monitor.  If you are 71-16 years old, ask your health care provider if you should take aspirin to prevent heart disease.  Diabetes screening involves taking a blood sample to check your fasting blood sugar level. This should be done once every 3 years after age 20 if you are at a normal weight and without risk factors for diabetes. Testing should be considered at a younger age or be carried out more frequently if you are overweight and have at least 1 risk factor for diabetes.  Colorectal cancer can be detected and often prevented. Most routine colorectal cancer screening begins at the age of 44 and continues through age 69. However, your health care provider may recommend screening at an earlier age if you have risk factors for colon cancer. On a yearly basis, your health care provider may provide home test kits to check for hidden blood in  the stool. A small camera at the end of a tube may be  used to directly examine the colon (sigmoidoscopy or colonoscopy) to detect the earliest forms of colorectal cancer. Talk to your health care provider about this at age 77 when routine screening begins. A direct exam of the colon should be repeated every 5-10 years through age 64, unless early forms of precancerous polyps or small growths are found.  People who are at an increased risk for hepatitis B should be screened for this virus. You are considered at high risk for hepatitis B if:  You were born in a country where hepatitis B occurs often. Talk with your health care provider about which countries are considered high risk.  Your parents were born in a high-risk country and you have not received a shot to protect against hepatitis B (hepatitis B vaccine).  You have HIV or AIDS.  You use needles to inject street drugs.  You live with, or have sex with, someone who has hepatitis B.  You are a man who has sex with other men (MSM).  You get hemodialysis treatment.  You take certain medicines for conditions like cancer, organ transplantation, and autoimmune conditions.  Hepatitis C blood testing is recommended for all people born from [birth year] through 1966 and any individual with known risk factors for hepatitis C.  Healthy men should no longer receive prostate-specific antigen (PSA) blood tests as part of routine cancer screening. Talk to your health care provider about prostate cancer screening.  Testicular cancer screening is not recommended for adolescents or adult males who have no symptoms. Screening includes self-exam, a health care provider exam, and other screening tests. Consult with your health care provider about any symptoms you have or any concerns you have about testicular cancer.  Practice safe sex. Use condoms and avoid high-risk sexual practices to reduce the spread of sexually transmitted infections (STIs).  You should be screened for STIs, including gonorrhea and chlamydia  if:  You are sexually active and are younger than 24 years.  You are older than 24 years, and your health care provider tells you that you are at risk for this type of infection.  Your sexual activity has changed since you were last screened, and you are at an increased risk for chlamydia or gonorrhea. Ask your health care provider if you are at risk.  If you are at risk of being infected with HIV, it is recommended that you take a prescription medicine daily to prevent HIV infection. This is called pre-exposure prophylaxis (PrEP). You are considered at risk if:  You are a man who has sex with other men (MSM).  You are a heterosexual man who is sexually active with multiple partners.  You take drugs by injection.  You are sexually active with a partner who has HIV.  Talk with your health care provider about whether you are at high risk of being infected with HIV. If you choose to begin PrEP, you should first be tested for HIV. You should then be tested every 3 months for as [place] as you are taking PrEP.  Use sunscreen. Apply sunscreen liberally and repeatedly throughout the day. You should seek shade when your shadow is shorter than you. Protect yourself by wearing Trotti sleeves, pants, a wide-brimmed hat, and sunglasses year round whenever you are outdoors.  Tell your health care provider of new moles or changes in moles, especially if there is a change in shape or color. Also, tell your health  care provider if a mole is larger than the size of a pencil eraser.  A one-time screening for abdominal aortic aneurysm (AAA) and surgical repair of large AAAs by ultrasound is recommended for men aged 74-75 years who are current or former smokers.  Stay current with your vaccines (immunizations). This information is not intended to replace advice given to you by your health care provider. Make sure you discuss any questions you have with your health care provider. Document Released: 01/26/2008  Document Revised: 08/20/2014 Document Reviewed: 05/03/2015 Elsevier Interactive Patient Education  2017 [employer].

## 2016-08-03 LAB — HEPATITIS C ANTIBODY: HCV Ab: NEGATIVE

## 2016-08-06 ENCOUNTER — Encounter: Payer: Self-pay | Admitting: Family

## 2016-08-07 ENCOUNTER — Encounter: Payer: Self-pay | Admitting: Family

## 2016-08-07 ENCOUNTER — Ambulatory Visit (INDEPENDENT_AMBULATORY_CARE_PROVIDER_SITE_OTHER): Payer: PPO | Admitting: Family

## 2016-08-07 VITALS — BP 126/84 | HR 75 | Temp 97.8°F | Resp 16 | Ht 72.0 in | Wt 300.0 lb

## 2016-08-07 DIAGNOSIS — G5701 Lesion of sciatic nerve, right lower limb: Secondary | ICD-10-CM | POA: Diagnosis not present

## 2016-08-07 DIAGNOSIS — I2510 Atherosclerotic heart disease of native coronary artery without angina pectoris: Secondary | ICD-10-CM

## 2016-08-07 DIAGNOSIS — I10 Essential (primary) hypertension: Secondary | ICD-10-CM

## 2016-08-07 DIAGNOSIS — E782 Mixed hyperlipidemia: Secondary | ICD-10-CM | POA: Diagnosis not present

## 2016-08-07 MED ORDER — IRBESARTAN 300 MG PO TABS: 300.0000 mg | ORAL_TABLET | Freq: Every day | ORAL | 11 refills | Status: DC

## 2016-08-07 MED ORDER — CLOPIDOGREL BISULFATE 75 MG PO TABS: 75.0000 mg | ORAL_TABLET | Freq: Every day | ORAL | 11 refills | Status: DC

## 2016-08-07 MED ORDER — VALACYCLOVIR HCL 1 G PO TABS: 1000.0000 mg | ORAL_TABLET | Freq: Three times a day (TID) | ORAL | 0 refills | Status: DC

## 2016-08-07 MED ORDER — PREDNISONE 10 MG (21) PO TBPK: ORAL_TABLET | ORAL | 0 refills | Status: DC

## 2016-08-07 MED ORDER — SIMVASTATIN 20 MG PO TABS: 20.0000 mg | ORAL_TABLET | Freq: Every day | ORAL | 11 refills | Status: DC

## 2016-08-07 MED ORDER — FAMOTIDINE 20 MG PO TABS: ORAL_TABLET | ORAL | 11 refills | Status: DC

## 2016-08-07 NOTE — Progress Notes (Signed)
Subjective:    Patient ID: Todd Weeks, male    DOB: 11/19/1955, 60 y.o.   MRN: [id number]  Chief Complaint  Patient presents with  . Follow-up    burning and numbnes and tingling in right leg    HPI:  Todd Weeks is a 60 y.o. male who  has a past medical history of Bell's palsy; Calf cramp; Chest pain; Depression; Diverticulitis; Fatigue; Headache(784.0); HTN (hypertension); Light-headedness; PVD (peripheral vascular disease) ([place]); Sexual dysfunction; and Shortness of breath. and presents today for an office visit.  1.) Numbness and tingling of thigh - This is a new problem. Associated symptom of numbness and tingling described also as burning located in his right thigh has been going on for about 1 week. No trauma or injury. No back pain. There are no modifying factors that make it better or worse. Described as burning and itching. Notes the sensation is there which primarily occurs when he is on his feet for a period of time. Denies any rashes. No changes in bowel habits or saddle anesthesia. No previous history of back or thigh injury.   2.) Hypertension - Currently maintained on Avapro. Reports taking medication as prescribed and denies adverse side effects or hypotensive readings. Blood pressures at home have remained well controlled. Denies worst headache of life or no new symptoms of end organ damage.   BP Readings from Last 3 Encounters:  08/07/16 126/84  08/02/16 (!) 148/80  05/18/16 160/76     3.) Hyperlipidemia - Currently maintained on simvastatin. Reports taking the medication as prescribed and denies adverse side effects or myalgias. No chest pain, shortness breath, or palpitations.  Lab Results  Component Value Date   CHOL 178 08/01/2016   HDL 43.20 08/01/2016   LDLCALC 101 (H) 08/01/2016   TRIG 172.0 (H) 08/01/2016   CHOLHDL 4 08/01/2016     No Known Allergies    Outpatient Medications Prior to Visit  Medication Sig Dispense Refill  . cholecalciferol  (VITAMIN D) 1000 units tablet Take 1,000 Units by mouth 2 times daily at 12 noon and 4 pm.    . Multiple Vitamins-Minerals (MULTIVITAMIN WITH MINERALS) tablet Take 1 tablet by mouth daily.    . clopidogrel (PLAVIX) 75 MG tablet Take 1 tablet (75 mg total) by mouth daily. 30 tablet 0  . famotidine (PEPCID) 20 MG tablet One at bedtime    . irbesartan (AVAPRO) 300 MG tablet Take 1 tablet (300 mg total) by mouth daily. 30 tablet 11  . LOSARTAN POTASSIUM PO Take by mouth.    . methylPREDNISolone (MEDROL DOSEPAK) 4 MG TBPK tablet follow package directions start on sat., take until finished 21 tablet 1  . simvastatin (ZOCOR) 20 MG tablet Take 1 tablet (20 mg total) by mouth daily at 6 PM. Yearly physical w/labs is due must see Marya Amsler for refills 30 tablet 0   No facility-administered medications prior to visit.      Review of Systems  Constitutional: Negative for chills and fever.  Eyes:       Negative for changes in vision  Respiratory: Negative for cough, chest tightness and wheezing.   Cardiovascular: Negative for chest pain, palpitations and leg swelling.  Neurological: Positive for numbness. Negative for dizziness, weakness and light-headedness.       Positive for numbness, tingling and burning of right upper leg.       Objective:    BP 126/84 (BP Location: Left Arm, Patient Position: Sitting, Cuff Size: Large)   Pulse  75   Temp 97.8 F (36.6 C) (Oral)   Resp 16   Ht 6' (1.829 m)   Wt 300 lb (136.1 kg)   SpO2 95%   BMI 40.69 kg/m  Nursing note and vital signs reviewed.  Physical Exam  Constitutional: He is oriented to person, place, and time. He appears well-developed and well-nourished. No distress.  Cardiovascular: Normal rate, regular rhythm, normal heart sounds and intact distal pulses.   Pulmonary/Chest: Effort normal and breath sounds normal.  Musculoskeletal:  Right upper leg - no obvious deformity, discoloration, or edema. No palpable tenderness able to be elicited.  Range of motion is within normal limits. There is mild discomfort noted around the puriform is. Distal pulses and sensation are intact and appropriate. Negative straight leg raise; negative Faber's test.  Neurological: He is alert and oriented to person, place, and time.  Skin: Skin is warm and dry.  Psychiatric: He has a normal mood and affect. His behavior is normal. Judgment and thought content normal.       Assessment & Plan:   Problem List Items Addressed This Visit      Cardiovascular and Mediastinum   HYPERTENSION, BENIGN ESSENTIAL - Primary    Hypertension appears medically controlled and below goal 140/90 with current regimen and no adverse side effects and good control. Continue to monitor blood pressures at home and follow low-sodium diet. Continue current dosage of Avapro. Continue to monitor.      Relevant Medications   irbesartan (AVAPRO) 300 MG tablet   simvastatin (ZOCOR) 20 MG tablet   CAD (coronary artery disease)    Coronary artery disease appears stable with no chest pain, shortness of breath, or heart palpitations. Continue to monitor through risk factor adjustments including hypertension and hyperlipidemia both appearing adequately controlled with medication regimen. Continue current dosage of clopidogrel for anticoagulation. Continue to monitor.      Relevant Medications   irbesartan (AVAPRO) 300 MG tablet   simvastatin (ZOCOR) 20 MG tablet     Nervous and Auditory   Piriformis syndrome of right side    Symptoms and exam consistent with former syndrome resulting in mild neuropathy. There is also concern for possibility of varicella-zoster with written prescription for valacyclovir should a rash appear. Treat conservatively with ice/moist heat, home exercise therapy, and start prednisone Dosepak. Follow-up if symptoms worsen or do not improve.        Other   Hyperlipidemia    Hyperlipidemia appears adequately controlled current regimen and no adverse side  effects. Continue current dosage of simvastatin. Continue to monitor.      Relevant Medications   irbesartan (AVAPRO) 300 MG tablet   simvastatin (ZOCOR) 20 MG tablet       I have discontinued Mr. Tyquell Stuckey POTASSIUM PO and methylPREDNISolone. I have also changed his simvastatin. Additionally, I am having him start on predniSONE and valACYclovir. Lastly, I am having him maintain his multivitamin with minerals, cholecalciferol, clopidogrel, famotidine, and irbesartan.   Meds ordered this encounter  Medications  . clopidogrel (PLAVIX) 75 MG tablet    Sig: Take 1 tablet (75 mg total) by mouth daily.    Dispense:  30 tablet    Refill:  11    Order Specific Question:   Supervising Provider    Answer:   Pricilla Holm A [id number]  . famotidine (PEPCID) 20 MG tablet    Sig: One at bedtime    Dispense:  30 tablet    Refill:  11    Order Specific  Question:   Supervising Provider    Answer:   Pricilla Holm A [id number]  . irbesartan (AVAPRO) 300 MG tablet    Sig: Take 1 tablet (300 mg total) by mouth daily.    Dispense:  30 tablet    Refill:  11    Order Specific Question:   Supervising Provider    Answer:   Pricilla Holm A [id number]  . simvastatin (ZOCOR) 20 MG tablet    Sig: Take 1 tablet (20 mg total) by mouth daily at 6 PM.    Dispense:  30 tablet    Refill:  11    Order Specific Question:   Supervising Provider    Answer:   Pricilla Holm A [id number]  . predniSONE (STERAPRED UNI-PAK 21 TAB) 10 MG (21) TBPK tablet    Sig: Take 6 tablets x 1 day, 5 tablets x 1 day, 4 tablets x 1 day, 3 tablets x 1 day, 2 tablets x 1 day, 1 tablet x 1 day    Dispense:  21 tablet    Refill:  0    Order Specific Question:   Supervising Provider    Answer:   Pricilla Holm A [id number]  . valACYclovir (VALTREX) 1000 MG tablet    Sig: Take 1 tablet (1,000 mg total) by mouth 3 (three) times daily.    Dispense:  21 tablet    Refill:  0    Order Specific Question:   Supervising  Provider    Answer:   Pricilla Holm A [id number]     Follow-up: Return if symptoms worsen or fail to improve.  Mauricio Po, FNP

## 2016-08-07 NOTE — Assessment & Plan Note (Addendum)
Hyperlipidemia appears adequately controlled current regimen and no adverse side effects. Continue current dosage of simvastatin. Continue to monitor.

## 2016-08-07 NOTE — Assessment & Plan Note (Signed)
Coronary artery disease appears stable with no chest pain, shortness of breath, or heart palpitations. Continue to monitor through risk factor adjustments including hypertension and hyperlipidemia both appearing adequately controlled with medication regimen. Continue current dosage of clopidogrel for anticoagulation. Continue to monitor.

## 2016-08-07 NOTE — Assessment & Plan Note (Signed)
Hypertension appears medically controlled and below goal 140/90 with current regimen and no adverse side effects and good control. Continue to monitor blood pressures at home and follow low-sodium diet. Continue current dosage of Avapro. Continue to monitor.

## 2016-08-07 NOTE — Patient Instructions (Addendum)
Thank you for choosing [facility].  SUMMARY AND INSTRUCTIONS:  Ice/moist heat 20 minutes every 2 hours as needed and after activity.  Stretching throughout the day.  Tylenol as needed for discomfort   Medication:  Continue to take medications as prescribed  Your prescription(s) have been submitted to your pharmacy or been printed and provided for you. Please take as directed and contact our office if you believe you are having problem(s) with the medication(s) or have any questions.  Follow up:  If your symptoms worsen or fail to improve, please contact our office for further instruction, or in case of emergency go directly to the emergency room at the closest medical facility.     Piriformis Syndrome Rehab Ask your health care provider which exercises are safe for you. Do exercises exactly as told by your health care provider and adjust them as directed. It is normal to feel mild stretching, pulling, tightness, or discomfort as you do these exercises, but you should stop right away if you feel sudden pain or your pain gets worse.Do not begin these exercises until told by your health care provider. Stretching and range of motion exercises These exercises warm up your muscles and joints and improve the movement and flexibility of your hip and pelvis. These exercises also help to relieve pain, numbness, and tingling. Exercise A: Hip rotators 1. Lie on your back on a firm surface. 2. Pull your left / right knee toward your same shoulder with your left / right hand until your knee is pointing toward the ceiling. Hold your left / right ankle with your other hand. 3. Keeping your knee steady, gently pull your left / right ankle toward your other shoulder until you feel a stretch in your buttocks. 4. Hold this position for __________ seconds. Repeat __________ times. Complete this stretch __________ times a day. Exercise B: Hip extensors 1. Lie on your back on a firm surface.  Both of your legs should be straight. 2. Pull your left / right knee to your chest. Hold your leg in this position by holding onto the back of your thigh or the front of your knee. 3. Hold this position for __________ seconds. 4. Slowly return to the starting position. Repeat __________ times. Complete this stretch __________ times a day. Strengthening exercises These exercises build strength and endurance in your hip and thigh muscles. Endurance is the ability to use your muscles for a [place] time, even after they get tired. Exercise C: Straight leg raises (hip abductors) 1. Lie on your side with your left / right leg in the top position. Lie so your head, shoulder, knee, and hip line up. Bend your bottom knee to help you balance. 2. Lift your top leg up 4-6 inches (10-15 cm), keeping your toes pointed straight ahead. 3. Hold this position for __________ seconds. 4. Slowly lower your leg to the starting position. Let your muscles relax completely. Repeat __________ times. Complete this exercise__________ times a day. Exercise D: Hip abductors and rotators, quadruped 1. Get on your hands and knees on a firm, lightly padded surface. Your hands should be directly below your shoulders, and your knees should be directly below your hips. 2. Lift your left / right knee out to the side. Keep your knee bent. Do not twist your body. 3. Hold this position for __________ seconds. 4. Slowly lower your leg. Repeat __________ times. Complete this exercise__________ times a day. Exercise E: Straight leg raises (hip extensors) 1. Lie on your abdomen on  a bed or a firm surface with a pillow under your hips. 2. Squeeze your buttock muscles and lift your left / right thigh off the bed. Do not let your back arch. 3. Hold this position for __________ seconds. 4. Slowly return to the starting position. Let your muscles relax completely before doing another repetition. Repeat __________ times. Complete this  exercise__________ times a day. This information is not intended to replace advice given to you by your health care provider. Make sure you discuss any questions you have with your health care provider. Document Released: 07/30/2005 Document Revised: 04/03/2016 Document Reviewed: 07/12/2015 Elsevier Interactive Patient Education  2017 [employer].

## 2016-08-07 NOTE — Assessment & Plan Note (Signed)
Symptoms and exam consistent with former syndrome resulting in mild neuropathy. There is also concern for possibility of varicella-zoster with written prescription for valacyclovir should a rash appear. Treat conservatively with ice/moist heat, home exercise therapy, and start prednisone Dosepak. Follow-up if symptoms worsen or do not improve.

## 2016-08-21 ENCOUNTER — Ambulatory Visit (INDEPENDENT_AMBULATORY_CARE_PROVIDER_SITE_OTHER): Payer: PPO | Admitting: Urology

## 2016-08-21 DIAGNOSIS — R972 Elevated prostate specific antigen [PSA]: Secondary | ICD-10-CM | POA: Diagnosis not present

## 2016-09-25 ENCOUNTER — Other Ambulatory Visit: Payer: Self-pay | Admitting: Urology

## 2016-09-25 DIAGNOSIS — R972 Elevated prostate specific antigen [PSA]: Secondary | ICD-10-CM

## 2016-12-25 ENCOUNTER — Telehealth: Payer: Self-pay | Admitting: Family

## 2016-12-25 NOTE — Telephone Encounter (Signed)
Patient wanted to switch PCP's due to this location being closer to him. Wanted the OK from both providers.

## 2016-12-25 NOTE — Telephone Encounter (Signed)
Okay 

## 2016-12-25 NOTE — Telephone Encounter (Signed)
Ok with me 

## 2017-01-21 ENCOUNTER — Ambulatory Visit (INDEPENDENT_AMBULATORY_CARE_PROVIDER_SITE_OTHER): Payer: PPO

## 2017-01-21 ENCOUNTER — Ambulatory Visit (INDEPENDENT_AMBULATORY_CARE_PROVIDER_SITE_OTHER): Payer: PPO | Admitting: Family Medicine

## 2017-01-21 ENCOUNTER — Encounter: Payer: Self-pay | Admitting: Family Medicine

## 2017-01-21 VITALS — BP 134/84 | HR 69 | Temp 98.1°F | Ht 72.0 in | Wt 293.4 lb

## 2017-01-21 DIAGNOSIS — M47816 Spondylosis without myelopathy or radiculopathy, lumbar region: Secondary | ICD-10-CM | POA: Diagnosis not present

## 2017-01-21 DIAGNOSIS — R208 Other disturbances of skin sensation: Secondary | ICD-10-CM | POA: Diagnosis not present

## 2017-01-21 DIAGNOSIS — M79641 Pain in right hand: Secondary | ICD-10-CM | POA: Insufficient documentation

## 2017-01-21 DIAGNOSIS — S62302A Unspecified fracture of third metacarpal bone, right hand, initial encounter for closed fracture: Secondary | ICD-10-CM | POA: Diagnosis not present

## 2017-01-21 DIAGNOSIS — M545 Low back pain: Secondary | ICD-10-CM

## 2017-01-21 DIAGNOSIS — R200 Anesthesia of skin: Secondary | ICD-10-CM

## 2017-01-21 MED ORDER — GABAPENTIN 100 MG PO CAPS: 100.0000 mg | ORAL_CAPSULE | Freq: Three times a day (TID) | ORAL | 3 refills | Status: DC

## 2017-01-21 MED ORDER — PREDNISONE 5 MG PO TABS: ORAL_TABLET | ORAL | 0 refills | Status: DC

## 2017-01-21 NOTE — Progress Notes (Signed)
Todd Weeks is a 61 y.o. male is here to [place].   Patient Care Team: Briscoe Deutscher, DO as PCP - General (Family Medicine) Burnell Blanks, MD as Consulting Physician (Cardiology) Elam Dutch, MD as Consulting Physician (Vascular Surgery) Deneise Lever, MD as Consulting Physician (Pulmonary Disease) Todd Weeks (Physician Assistant) Todd Gallo, MD as Consulting Physician (Urology)   History of Present Illness:   Todd Weeks, Todd Weeks, acting as scribe for Dr. Juleen China.  Hand Pain   The incident occurred more than 1 week ago. The incident occurred at home. The pain is present in the right hand. The pain does not radiate. The pain is moderate. The pain has been fluctuating since the incident. Pertinent negatives include no chest pain. The symptoms are aggravated by movement, lifting and palpation. He has tried ice and heat for the symptoms.   Thigh Numbness Right lateral to anterior thigh numbness x 1 year. 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 Does not have radiating pain down leg. No skin changes. He tried treatment for piriformis syndrome without relief.   There are no preventive care reminders to display for this patient.  Immunization History  Administered Date(s) Administered  . Influenza Split 05/14/2015  . Influenza,inj,Quad PF,36+ Mos 08/02/2016  . Tdap 12/13/2014   PMHx, SurgHx, SocialHx, Medications, and Allergies were reviewed in the Visit Navigator and updated as appropriate.   Past Medical History:  Diagnosis Date  . Bell's palsy   . Depression   . Diverticulitis   . Headache(784.0)   . HTN (hypertension)   . PVD (peripheral vascular disease) ([place])   . Sexual dysfunction    Past Surgical History:  Procedure Laterality Date  . FEMORAL BYPASS     Family History  Problem Relation Age of Onset  . Hypertension Mother   . Deep vein thrombosis Father   . Diabetes Brother   .  Tuberculosis Maternal Grandfather   . Emphysema Maternal Grandfather   . Lung disease Brother   . Asthma Son    Social History  Substance Use Topics  . Smoking status: Former Smoker    Packs/day: 1.50    Years: 46.00    Types: Cigarettes    Quit date: 12/12/2011  . Smokeless tobacco: Never Used  . Alcohol use 0.6 oz/week    1 Cans of beer per week     Comment: a couple of beer on a weekend    Current Medications and Allergies:   .  cholecalciferol (VITAMIN D) 1000 units tablet, Take 1,000 Units by mouth 2 times daily at 12 noon and 4 pm., Disp: , Rfl:  .  clopidogrel (PLAVIX) 75 MG tablet, Take 1 tablet (75 mg total) by mouth daily., Disp: 30 tablet, Rfl: 11 .  famotidine (PEPCID) 20 MG tablet, One at bedtime, Disp: 30 tablet, Rfl: 11 .  irbesartan (AVAPRO) 300 MG tablet, Take 1 tablet (300 mg total) by mouth daily., Disp: 30 tablet, Rfl: 11 .  Multiple Vitamins-Minerals (MULTIVITAMIN WITH MINERALS) tablet, Take 1 tablet by mouth daily., Disp: , Rfl:   No Known Allergies Review of Systems:   Review of Systems  Constitutional: Negative for chills and fever.  HENT: Negative for congestion, ear pain and sore throat.   Eyes: Negative for blurred vision and pain.  Respiratory: Negative for cough and shortness of breath.   Cardiovascular: Negative for chest pain and palpitations.  Gastrointestinal: Negative for abdominal pain, nausea  and vomiting.  Genitourinary: Negative for frequency.  Musculoskeletal: Negative for back pain and neck pain.  Skin: Negative for rash.  Neurological: Negative for dizziness, loss of consciousness, weakness and headaches.  Endo/Heme/Allergies: Does not bruise/bleed easily.    Vitals:   Vitals:   01/21/17 1131  BP: 134/84  Pulse: 69  Temp: 98.1 F (36.7 C)  TempSrc: Oral  SpO2: 91%  Weight: 293 lb 6.4 oz (133.1 kg)  Height: 6' (1.829 m)     Body mass index is 39.79 kg/m.  Physical Exam:   Physical Exam  Constitutional: He is oriented  to person, place, and time. He appears well-developed and well-nourished. No distress.  HENT:  Head: Normocephalic and atraumatic.  Right Ear: External ear normal.  Left Ear: External ear normal.  Nose: Nose normal.  Mouth/Throat: Oropharynx is clear and moist.  Eyes: Conjunctivae and EOM are normal. Pupils are equal, round, and reactive to light.  Neck: Normal range of motion. Neck supple.  Cardiovascular: Normal rate, regular rhythm, normal heart sounds and intact distal pulses.   Pulmonary/Chest: Effort normal and breath sounds normal.  Abdominal: Soft. Bowel sounds are normal.  Musculoskeletal:       Hands:      Legs: Neurological: He is alert and oriented to person, place, and time.  Skin: Skin is warm and dry.  Psychiatric: He has a normal mood and affect. His behavior is normal. Judgment and thought content normal.  Nursing note and vitals reviewed.  Assessment and Plan:   Sukhraj was seen today for establish care and hand pain.  Diagnoses and all orders for this visit:  Right hand pain -     DG Hand Complete Right - area of irregularity on xray. Will see what Radiology thinks. 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 Follow up with Paulla Fore in 2 weeks. Treatment as below for edema. Red flags reviewed.   Numbness of right anterior thigh Comments: Unclear etiology. Patient was carrying wallet in his left back pocket but states that he doesn't normally. Xray with degenerative changes. Treatment below.  Orders: -     DG Lumbar Spine 2-3 Views; Future -     gabapentin (NEURONTIN) 100 MG capsule; Take 1 capsule (100 mg total) by mouth 3 (three) times daily. -     predniSONE (DELTASONE) 5 MG tablet; 6-5-4-3-2-1-off   . Reviewed expectations re: course of current medical issues. . Discussed self-management of symptoms. . Outlined signs and symptoms indicating need for more acute intervention. . Patient verbalized understanding and all questions  were answered. Marland Kitchen Health Maintenance issues including appropriate healthy diet, exercise, and smoking avoidance were discussed with patient. . See orders for this visit as documented in the electronic medical record. . Patient received an After Visit Summary.  Todd Weeks served as Education administrator during this visit. History, Physical, and Plan performed by medical provider. The above documentation has been reviewed and is accurate and complete. Briscoe Deutscher, D.O.  Briscoe Deutscher, DO , [place] 01/21/2017  Future Appointments Date Time Provider [place]  02/11/2017 9:00 AM Gerda Diss, DO [facility] None

## 2017-01-22 ENCOUNTER — Telehealth: Payer: Self-pay

## 2017-01-22 NOTE — Telephone Encounter (Signed)
Called patient and left vm for him to call office

## 2017-01-22 NOTE — Telephone Encounter (Signed)
Patient keeping original appt. Called office and stated he wasn't in that much pain and would see [place] on 02/11/17.

## 2017-01-22 NOTE — Telephone Encounter (Signed)
-----   Message from Gerda Diss, DO sent at 01/22/2017  8:26 AM EDT ----- Can you schedule him in a day or two.  If he's hurting terribly we can work him in ASAP and do a splint otherwise lets get a Small, Medium, Large of the hand based radial gutter splint.  ----- Message ----- From: Briscoe Deutscher, DO Sent: 01/21/2017   8:28 PM To: Gerda Diss, DO  This is that patient that I talked to you about today. Will he need that splint after all? Nondisplaced fx.   ----- Message ----- From: Interface, Rad Results In Sent: 01/21/2017   1:59 PM To: Briscoe Deutscher, DO

## 2017-01-22 NOTE — Telephone Encounter (Signed)
Patient returning call from [place].  Advised him that he could be seen sooner by Dr. Paulla Fore, but he is okay with seeing him for the date I arranged when checking out w/ Dr. Juleen China set for 02/11/2017.  Thank you,  -LL

## 2017-01-22 NOTE — Telephone Encounter (Signed)
10-4. We will keep the original appt since he isn't in that much pain.  Thanks.

## 2017-01-28 ENCOUNTER — Telehealth: Payer: Self-pay | Admitting: Family Medicine

## 2017-01-28 NOTE — Telephone Encounter (Signed)
Patient stated that the predniSONE (DELTASONE) 5 MG tablet is not working and he needs something else. Patient requested this be rushed and contact him before the end of the day if possible or first thing in the morning. Please advise. Okay to leave a detailed message on phone.

## 2017-01-28 NOTE — Telephone Encounter (Signed)
Please advise 

## 2017-01-29 NOTE — Telephone Encounter (Signed)
Left message (per patient request) asking patient to call office to schedule sooner appointment with Dr. Paulla Fore for splint/cast/treatment of fracture.

## 2017-01-29 NOTE — Telephone Encounter (Signed)
Please see previous thread. Xray with nondisplaced fracture. He was offered quicker appointment with Paulla Fore and declined. Would he like urgent visit now? Will need splint/cast.

## 2017-01-30 NOTE — Telephone Encounter (Signed)
Left message on voicemail to call office. Pt request wanting sooner appointment with Dr. Paulla Fore for splint/cast/treatment of fracture.

## 2017-01-30 NOTE — Telephone Encounter (Signed)
Patient will check his schedule and call back to reschedule something sooner than the new patient appointment with Paulla Fore.

## 2017-02-04 ENCOUNTER — Ambulatory Visit (INDEPENDENT_AMBULATORY_CARE_PROVIDER_SITE_OTHER): Payer: PPO | Admitting: Sports Medicine

## 2017-02-04 ENCOUNTER — Encounter: Payer: Self-pay | Admitting: Sports Medicine

## 2017-02-04 VITALS — BP 140/92 | HR 82 | Ht 72.0 in | Wt 293.2 lb

## 2017-02-04 DIAGNOSIS — S62362A Nondisplaced fracture of neck of third metacarpal bone, right hand, initial encounter for closed fracture: Secondary | ICD-10-CM

## 2017-02-04 DIAGNOSIS — M19049 Primary osteoarthritis, unspecified hand: Secondary | ICD-10-CM | POA: Diagnosis not present

## 2017-02-04 DIAGNOSIS — M79641 Pain in right hand: Secondary | ICD-10-CM | POA: Diagnosis not present

## 2017-02-04 HISTORY — DX: Nondisplaced fracture of neck of third metacarpal bone, right hand, initial encounter for closed fracture: S62.362A

## 2017-02-04 NOTE — Assessment & Plan Note (Signed)
Does have a small nondisplaced metacarpal fracture is likely secondary to overuse and likely stress related.  We will have him mobilize his third and fourth fingers with buddy taping.  We discussed the option for a radial gutter hand based MCP splint but he would like to defer this at this time.  Overall this does appear to be stable and I suspect he should do well with this.  The lack of improvement repeat x-rays versus [facility] ultrasound for further eval

## 2017-02-04 NOTE — Progress Notes (Signed)
OFFICE VISIT NOTE Juanda Bond. [place], [place] at [place] - 61 y.o. male MRN [id number]  Date of birth: 1956/01/18  Visit Date: 02/04/2017  PCP: Briscoe Deutscher, DO   Referred by: Briscoe Deutscher, DO  Burlene Arnt, CMA acting as scribe for Dr. Paulla Fore.  SUBJECTIVE:   Chief Complaint  Patient presents with  . lumbar spine/low back pain   HPI: As below and per problem based documentation when appropriate.  Pt presents today with complaint of right hand pain.  Pain started about 6-7 weeks ago after pulling a pressure cleaner. He noticed that the hand started swelling immediately. He has difficulty grasping objections. He denies decreased ROM. He had xray done when he was in to see Dr. Juleen China about 10 days ago. He still has a little bit of swelling around the middle finger. He thinks the xray showed a fracture. He has been taking OTC Acetaminophen with some relief. Pain is described at a sharp pain that comes and goes. Pain is at its worst first thing in the morning. His hand seems very stiff after lack of movement. Pain is rated about 4/10. He was prescribed Prednisone but has finished that and is not feeling any better so he wanted to be re-evaluated. Prior to seeing Dr. Juleen China he was alternating heat and ice and soaking his hand is epson salt.   Pt denies fever, chills, night sweats.     Review of Systems  Constitutional: Negative for chills and fever.  Respiratory: Positive for shortness of breath. Negative for wheezing.   Cardiovascular: Negative for chest pain and palpitations.  Musculoskeletal: Negative for falls.  Neurological: Positive for tingling. Negative for dizziness and headaches.  Endo/Heme/Allergies: Does not bruise/bleed easily.    Otherwise per HPI.  HISTORY & PERTINENT PRIOR DATA:  No specialty comments available. He reports that he quit smoking about 5 years ago. His smoking use included  Cigarettes. He has a 69.00 pack-year smoking history. He has never used smokeless tobacco. No results for input(s): HGBA1C, LABURIC in the last 8760 hours. Medications & Allergies reviewed per EMR Patient Active Problem List   Diagnosis Date Noted  . Closed nondisplaced fracture of neck of third metacarpal bone of right hand 02/04/2017  . Numbness of right anterior thigh 01/21/2017  . Right hand pain 01/21/2017  . Lesion or mass of paranasal sinuses 01/18/2016  . Severe obesity (BMI >= 40) ([place]) 10/29/2015  . Subacromial bursitis 06/01/2015  . Hyperlipidemia 05/04/2015  . Right shoulder pain 05/04/2015  . Atherosclerosis of native arteries of extremity with intermittent claudication ([place]) 05/22/2012  . CAD (coronary artery disease) 02/02/2011  . Abnormal cardiovascular function study 01/04/2011  . EMBOLISM&THROMBOSIS ARTERIES LOWER EXTREMITY 09/15/2007  . PROSTATE SPECIFIC ANTIGEN, ELEVATED 08/06/2007  . Former smoker 08/01/2007  . HTN (hypertension) 08/01/2007   Past Medical History:  Diagnosis Date  . Bell's palsy   . Depression   . Diverticulitis   . Headache(784.0)   . HTN (hypertension)   . PVD (peripheral vascular disease) ([place])   . Sexual dysfunction    Family History  Problem Relation Age of Onset  . Hypertension Mother   . Deep vein thrombosis Father   . Diabetes Brother   . Tuberculosis Maternal Grandfather   . Emphysema Maternal Grandfather   . Lung disease Brother   . Asthma Son    Past Surgical History:  Procedure Laterality Date  . FEMORAL BYPASS  Social History   Occupational History  . 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 Unemployed     disability   Social History Main Topics  . Smoking status: Former Smoker    Packs/day: 1.50    Years: 46.00    Types: Cigarettes    Quit date: 12/12/2011  . Smokeless tobacco: Never Used  . Alcohol use 0.6 oz/week    1 Cans of beer per week     Comment: a couple of beer on a weekend  . Drug use: Yes    Types:  Cocaine, Marijuana, "Crack" cocaine     Comment: not since 1999  . Sexual activity: Not on file    OBJECTIVE:  VS:  HT:6' (182.9 cm)   WT:293 lb 3.2 oz (133 kg)  BMI:39.8    BP:(!) 140/92  HR:82bpm  TEMP: ( )  RESP:94 % EXAM: Findings:  WDWN, NAD, Non-toxic appearing Alert & appropriately interactive Not depressed or anxious appearing No increased work of breathing. Pupils are equal. EOM intact without nystagmus No clubbing or cyanosis of the extremities appreciated No significant rashes/lesions/ulcerations overlying the examined area. Radial pulses 1+/4.  No significant generalized UE edema. Sensation intact to light touch in upper extremities.  Right hand: Overall well aligned.  He does have a moderate amount of swelling at the third MCP.  This is only mildly painful.  Ligamentously stable.  He is able to fully open and close his hand.  Grip strength is only minimally diminished.     No results found. ASSESSMENT & PLAN:  Kimo was seen today for lumbar spine/low back pain.  Diagnoses and all orders for this visit:  Right hand pain  Closed nondisplaced fracture of neck of third metacarpal bone of right hand, initial encounter  Hand arthritis  ================================================================= Closed nondisplaced fracture of neck of third metacarpal bone of right hand Does have a small nondisplaced metacarpal fracture is likely secondary to overuse and likely stress related.  We will have him mobilize his third and fourth fingers with buddy taping.  We discussed the option for a radial gutter hand based MCP splint but he would like to defer this at this time.  Overall this does appear to be stable and I suspect he should do well with this.  The lack of improvement repeat x-rays versus [facility] ultrasound for further eval  ================================================================= Follow-up: Return in about 6 weeks (around 03/18/2017) for repeat clinical  exam.   CMA/ATC served as scribe during this visit. History, Physical, and Plan performed by medical provider. Documentation and orders reviewed and attested to.      Teresa Coombs, [facility] Physician

## 2017-02-11 ENCOUNTER — Ambulatory Visit: Payer: PPO | Admitting: Sports Medicine

## 2017-02-25 DIAGNOSIS — H35371 Puckering of macula, right eye: Secondary | ICD-10-CM | POA: Diagnosis not present

## 2017-02-25 DIAGNOSIS — H02834 Dermatochalasis of left upper eyelid: Secondary | ICD-10-CM | POA: Diagnosis not present

## 2017-02-25 DIAGNOSIS — H524 Presbyopia: Secondary | ICD-10-CM | POA: Diagnosis not present

## 2017-02-25 DIAGNOSIS — H52223 Regular astigmatism, bilateral: Secondary | ICD-10-CM | POA: Diagnosis not present

## 2017-02-25 DIAGNOSIS — H43393 Other vitreous opacities, bilateral: Secondary | ICD-10-CM | POA: Diagnosis not present

## 2017-02-25 DIAGNOSIS — H5203 Hypermetropia, bilateral: Secondary | ICD-10-CM | POA: Diagnosis not present

## 2017-03-26 ENCOUNTER — Telehealth: Payer: Self-pay | Admitting: Family Medicine

## 2017-03-26 NOTE — Telephone Encounter (Signed)
Left pt message asking to call Ebony Hail back directly at [phone] to schedule AWV. Thanks!  *NOTE* Last AWV 08/02/17

## 2017-04-09 ENCOUNTER — Ambulatory Visit (HOSPITAL_COMMUNITY)
Admission: EM | Admit: 2017-04-09 | Discharge: 2017-04-09 | Disposition: A | Discharge status: Nursing Home (Includes ICF) | Payer: PPO | Source: Home / Self Care | Attending: Emergency Medicine | Admitting: Emergency Medicine

## 2017-04-09 ENCOUNTER — Emergency Department (HOSPITAL_COMMUNITY)
Admission: EM | Admit: 2017-04-09 | Discharge: 2017-04-09 | Disposition: A | Discharge status: Home / Self Care | Payer: PPO | Attending: Emergency Medicine | Admitting: Emergency Medicine

## 2017-04-09 ENCOUNTER — Encounter (HOSPITAL_COMMUNITY): Payer: Self-pay | Admitting: Emergency Medicine

## 2017-04-09 ENCOUNTER — Emergency Department (HOSPITAL_COMMUNITY): Payer: PPO

## 2017-04-09 ENCOUNTER — Encounter (HOSPITAL_COMMUNITY): Payer: Self-pay

## 2017-04-09 DIAGNOSIS — K5792 Diverticulitis of intestine, part unspecified, without perforation or abscess without bleeding: Secondary | ICD-10-CM

## 2017-04-09 DIAGNOSIS — I10 Essential (primary) hypertension: Secondary | ICD-10-CM | POA: Insufficient documentation

## 2017-04-09 DIAGNOSIS — Z79899 Other long term (current) drug therapy: Secondary | ICD-10-CM | POA: Diagnosis not present

## 2017-04-09 DIAGNOSIS — Z87891 Personal history of nicotine dependence: Secondary | ICD-10-CM | POA: Insufficient documentation

## 2017-04-09 DIAGNOSIS — R1032 Left lower quadrant pain: Secondary | ICD-10-CM | POA: Diagnosis not present

## 2017-04-09 DIAGNOSIS — Z7902 Long term (current) use of antithrombotics/antiplatelets: Secondary | ICD-10-CM | POA: Insufficient documentation

## 2017-04-09 DIAGNOSIS — K5790 Diverticulosis of intestine, part unspecified, without perforation or abscess without bleeding: Secondary | ICD-10-CM | POA: Diagnosis not present

## 2017-04-09 DIAGNOSIS — I2510 Atherosclerotic heart disease of native coronary artery without angina pectoris: Secondary | ICD-10-CM | POA: Insufficient documentation

## 2017-04-09 DIAGNOSIS — R109 Unspecified abdominal pain: Secondary | ICD-10-CM | POA: Diagnosis not present

## 2017-04-09 LAB — CBC
HCT: 47.6 % (ref 39.0–52.0)
Hemoglobin: 15.5 g/dL (ref 13.0–17.0)
MCH: 28.9 pg (ref 26.0–34.0)
MCHC: 32.6 g/dL (ref 30.0–36.0)
MCV: 88.6 fL (ref 78.0–100.0)
PLATELETS: 273 10*3/uL (ref 150–400)
RBC: 5.37 MIL/uL (ref 4.22–5.81)
RDW: 13.3 % (ref 11.5–15.5)
WBC: 15.1 10*3/uL — ABNORMAL HIGH (ref 4.0–10.5)

## 2017-04-09 LAB — COMPREHENSIVE METABOLIC PANEL
ALK PHOS: 64 U/L (ref 38–126)
ALT: 29 U/L (ref 17–63)
AST: 20 U/L (ref 15–41)
Albumin: 4.4 g/dL (ref 3.5–5.0)
Anion gap: 10 (ref 5–15)
BUN: 12 mg/dL (ref 6–20)
CALCIUM: 9.3 mg/dL (ref 8.9–10.3)
CO2: 26 mmol/L (ref 22–32)
CREATININE: 0.73 mg/dL (ref 0.61–1.24)
Chloride: 101 mmol/L (ref 101–111)
GFR calc non Af Amer: >60 mL/min (ref >60)
Glucose, Bld: 111 mg/dL — ABNORMAL HIGH (ref 65–99)
Potassium: 4.3 mmol/L (ref 3.5–5.1)
Sodium: 137 mmol/L (ref 135–145)
Total Bilirubin: 1.4 mg/dL — ABNORMAL HIGH (ref 0.3–1.2)
Total Protein: 7.7 g/dL (ref 6.5–8.1)

## 2017-04-09 LAB — LIPASE, BLOOD: Lipase: 27 U/L (ref 11–51)

## 2017-04-09 MED ORDER — IOPAMIDOL (ISOVUE-300) INJECTION 61%
INTRAVENOUS | Status: AC
  Administered 2017-04-09: 100 mL
  Filled 2017-04-09: qty 100

## 2017-04-09 MED ORDER — METRONIDAZOLE 500 MG PO TABS: 500.0000 mg | ORAL_TABLET | Freq: Three times a day (TID) | ORAL | 0 refills | Status: DC

## 2017-04-09 MED ORDER — CIPROFLOXACIN HCL 500 MG PO TABS: 500.0000 mg | ORAL_TABLET | Freq: Two times a day (BID) | ORAL | 0 refills | Status: DC

## 2017-04-09 MED ORDER — HYDROCODONE-ACETAMINOPHEN 5-325 MG PO TABS: 1.0000 | ORAL_TABLET | ORAL | 0 refills | Status: DC | PRN

## 2017-04-09 MED ORDER — CIPROFLOXACIN HCL 500 MG PO TABS
500.0000 mg | ORAL_TABLET | Freq: Once | ORAL | Status: AC
  Administered 2017-04-09: 500 mg via ORAL
  Filled 2017-04-09: qty 1

## 2017-04-09 MED ORDER — ONDANSETRON 8 MG PO TBDP: 8.0000 mg | ORAL_TABLET | Freq: Three times a day (TID) | ORAL | 0 refills | Status: DC | PRN

## 2017-04-09 MED ORDER — ACETAMINOPHEN 500 MG PO TABS
1000.0000 mg | ORAL_TABLET | Freq: Once | ORAL | Status: AC
  Administered 2017-04-09: 1000 mg via ORAL
  Filled 2017-04-09: qty 2

## 2017-04-09 MED ORDER — METRONIDAZOLE 500 MG PO TABS
500.0000 mg | ORAL_TABLET | Freq: Once | ORAL | Status: AC
  Administered 2017-04-09: 500 mg via ORAL
  Filled 2017-04-09: qty 1

## 2017-04-09 NOTE — Discharge Instructions (Signed)
Your CAT scan does show signs of diverticulitis. Have been giving her first dose of Flagyl and Cipro in the ED. Please continue taking his medication for 7 days. Have him continue short course of pain medicine to take as needed. Use Zofran for nausea.  Clear liquid diet for 24 hours then advance diet as tolerated.  Return to the ED if he develops significant fevers, unable to keep her antibiotic down by mouth, worsening pain.

## 2017-04-09 NOTE — ED Notes (Signed)
Pt given water.  Tolerated well

## 2017-04-09 NOTE — Discharge Instructions (Signed)
Go immediately to the emergency room. I think you need a more comprehensive workup somewhat we can provide here. Let them know if your abdominal pain changes, gets worse, or for other concerns.

## 2017-04-09 NOTE — ED Provider Notes (Signed)
[facility] DEPT Provider Note   CSN: [id number] Arrival date & time: 04/09/17  1101     History   Chief Complaint Chief Complaint  Patient presents with  . Abdominal Pain    HPI Todd Weeks is a 61 y.o. male.  HPI 61 year old Caucasian male past medical history significant for diverticulitis, hypertension that presents to the emergency Department today with complaints of left lower quadrant abdominal pain. Patient states his pain started 2 days ago. Describes it as stabbing in nature. Pain is only worse with movement. Reports chills last night but denies any specific fevers. He reports nausea but denies any emesis. Denies any diarrhea or blood in his stool. He states his last bowel movement was this morning and was normal. States he has been tolerating by mouth fluids without any difficulties. The patient states that movement makes the pain worse. Holding still makes the pain better. States he has not tried nothing for his pain prior to arrival. Patient states he has a history of diverticulitis and this feels very similar. He was seen in urgent care prior to ED evaluation and sent him here for evaluation.  Last episode of diverticulitis was 10/19/2014 which was treated as an outpatient with Flagyl and Cipro. Was told to follow-up with GI. Last colonoscopy was 5-6 years ago.  Pt denies any fever, ha, vision changes, lightheadedness, dizziness, congestion, neck pain, cp, sob, cough, v/d, urinary symptoms, change in bowel habits, melena, hematochezia, lower extremity paresthesias.  Past Medical History:  Diagnosis Date  . Bell's palsy   . Depression   . Diverticulitis   . Headache(784.0)   . HTN (hypertension)   . PVD (peripheral vascular disease) ([place])   . Sexual dysfunction     Patient Active Problem List   Diagnosis Date Noted  . Closed nondisplaced fracture of neck of third metacarpal bone of right hand 02/04/2017  . Numbness of right anterior thigh 01/21/2017  . Right  hand pain 01/21/2017  . Lesion or mass of paranasal sinuses 01/18/2016  . Severe obesity (BMI >= 40) ([place]) 10/29/2015  . Subacromial bursitis 06/01/2015  . Hyperlipidemia 05/04/2015  . Right shoulder pain 05/04/2015  . Atherosclerosis of native arteries of extremity with intermittent claudication ([place]) 05/22/2012  . CAD (coronary artery disease) 02/02/2011  . Abnormal cardiovascular function study 01/04/2011  . EMBOLISM&THROMBOSIS ARTERIES LOWER EXTREMITY 09/15/2007  . PROSTATE SPECIFIC ANTIGEN, ELEVATED 08/06/2007  . Former smoker 08/01/2007  . HTN (hypertension) 08/01/2007    Past Surgical History:  Procedure Laterality Date  . FEMORAL BYPASS         Home Medications    Prior to Admission medications   Medication Sig Start Date End Date Taking? Authorizing Provider  cholecalciferol (VITAMIN D) 1000 units tablet Take 1,000 Units by mouth 2 times daily at 12 noon and 4 pm.    [provider]  clopidogrel (PLAVIX) 75 MG tablet Take 1 tablet (75 mg total) by mouth daily. 08/07/16   Golden Circle, FNP  famotidine (PEPCID) 20 MG tablet One at bedtime 08/07/16   Golden Circle, FNP  gabapentin (NEURONTIN) 100 MG capsule Take 1 capsule (100 mg total) by mouth 3 (three) times daily. 01/21/17   Briscoe Deutscher, DO  irbesartan (AVAPRO) 300 MG tablet Take 1 tablet (300 mg total) by mouth daily. 08/07/16   Golden Circle, FNP  Multiple Vitamins-Minerals (MULTIVITAMIN WITH MINERALS) tablet Take 1 tablet by mouth daily.    [provider]    Family History Family History  Problem Relation Age of Onset  . Hypertension Mother   . Deep vein thrombosis Father   . Diabetes Brother   . Tuberculosis Maternal Grandfather   . Emphysema Maternal Grandfather   . Lung disease Brother   . Asthma Son     Social History Social History  Substance Use Topics  . Smoking status: Former Smoker    Packs/day: 1.50    Years: 46.00    Types: Cigarettes    Quit date:  12/12/2011  . Smokeless tobacco: Never Used  . Alcohol use 0.6 oz/week    1 Cans of beer per week     Comment: a couple of beer on a weekend     Allergies   Patient has no known allergies.   Review of Systems Review of Systems  Constitutional: Positive for chills. Negative for fever.  HENT: Negative for congestion and sore throat.   Eyes: Negative for visual disturbance.  Respiratory: Negative for cough and shortness of breath.   Cardiovascular: Negative for chest pain.  Gastrointestinal: Positive for abdominal pain and nausea. Negative for blood in stool, diarrhea and vomiting.  Genitourinary: Negative for dysuria, flank pain, frequency, hematuria, scrotal swelling, testicular pain and urgency.  Musculoskeletal: Negative for arthralgias and myalgias.  Skin: Negative for rash.  Neurological: Negative for dizziness, syncope, weakness, light-headedness, numbness and headaches.  Psychiatric/Behavioral: Negative for sleep disturbance. The patient is not nervous/anxious.      Physical Exam Updated Vital Signs BP 139/77   Pulse 84   Temp 99 F (37.2 C) (Oral)   Resp 20   Ht 6' (1.829 m)   Wt 133.8 kg (295 lb)   SpO2 95%   BMI 40.01 kg/m   Physical Exam  Constitutional: He is oriented to person, place, and time. He appears well-developed and well-nourished.  Non-toxic appearance. No distress.  HENT:  Head: Normocephalic and atraumatic.  Mouth/Throat: Oropharynx is clear and moist.  Eyes: Pupils are equal, round, and reactive to light. Conjunctivae are normal. Right eye exhibits no discharge. Left eye exhibits no discharge.  Neck: Normal range of motion. Neck supple.  Cardiovascular: Normal rate, regular rhythm, normal heart sounds and intact distal pulses.  Exam reveals no gallop and no friction rub.   No murmur heard. Pulmonary/Chest: Effort normal and breath sounds normal. No respiratory distress. He has no wheezes. He has no rales. He exhibits no tenderness.  Abdominal:  Soft. Bowel sounds are normal. He exhibits no distension. There is tenderness in the left lower quadrant. There is no rigidity, no rebound, no guarding, no CVA tenderness, no tenderness at McBurney's point and negative Murphy's sign.  Musculoskeletal: Normal range of motion. He exhibits no tenderness.  Lymphadenopathy:    He has no cervical adenopathy.  Neurological: He is alert and oriented to person, place, and time.  Skin: Skin is warm and dry. Capillary refill takes less than 2 seconds. No rash noted.  Psychiatric: His behavior is normal. Judgment and thought content normal.  Nursing note and vitals reviewed.    ED Treatments / Results  Labs (all labs ordered are listed, but only abnormal results are displayed) Labs Reviewed  COMPREHENSIVE METABOLIC PANEL - Abnormal; Notable for the following:       Result Value   Glucose, Bld 111 (*)    Total Bilirubin 1.4 (*)    All other components within normal limits  CBC - Abnormal; Notable for the following:    WBC 15.1 (*)    All other components within normal limits  LIPASE, BLOOD    EKG  EKG Interpretation None       Radiology Ct Abdomen Pelvis W Contrast  Result Date: 04/09/2017 CLINICAL DATA:  History of diverticulitis.  Abdominal pain, nausea. EXAM: CT ABDOMEN AND PELVIS WITH CONTRAST TECHNIQUE: Multidetector CT imaging of the abdomen and pelvis was performed using the standard protocol following bolus administration of intravenous contrast. CONTRAST:  168mL ISOVUE-300 IOPAMIDOL (ISOVUE-300) INJECTION 61% COMPARISON:  01/19/2010 FINDINGS: Lower chest: Lung bases are clear. No effusions. Heart is normal size. Hepatobiliary: Suspect mild diffuse fatty infiltration of the liver. Gallbladder unremarkable. Pancreas: No focal abnormality or ductal dilatation. Spleen: No focal abnormality.  Normal size. Adrenals/Urinary Tract: No adrenal abnormality. No focal renal abnormality. No stones or hydronephrosis. Urinary bladder is  unremarkable. Stomach/Bowel: Descending and sigmoid scratched at descending colonic and sigmoid diverticulosis. Inflammatory stranding around the proximal sigmoid colon and distal descending colon compatible with active diverticulitis. Appendix is normal. No evidence of bowel obstruction. Vascular/Lymphatic: Aortic and iliac calcifications. No evidence of aneurysm or adenopathy. Reproductive: No visible focal abnormality. Other: No free fluid or free air. Musculoskeletal: No acute bony abnormality. IMPRESSION: Descending colonic and sigmoid diverticulosis with active diverticulitis in the distal descending colon and proximal sigmoid colon. No complicating feature at this time. Suspect mild fatty infiltration of the liver. Aortoiliac atherosclerosis. Electronically Signed   By: Rolm Baptise M.D.   On: 04/09/2017 16:04    Procedures Procedures (including critical care time)  Medications Ordered in ED Medications  iopamidol (ISOVUE-300) 61 % injection (100 mLs  Contrast Given 04/09/17 1551)  metroNIDAZOLE (FLAGYL) tablet 500 mg (500 mg Oral Given 04/09/17 1653)  ciprofloxacin (CIPRO) tablet 500 mg (500 mg Oral Given 04/09/17 1653)  acetaminophen (TYLENOL) tablet 1,000 mg (1,000 mg Oral Given 04/09/17 1653)     Initial Impression / Assessment and Plan / ED Course  I have reviewed the triage vital signs and the nursing notes.  Pertinent labs & imaging results that were available during my care of the patient were reviewed by me and considered in my medical decision making (see chart for details).     Patient presents to the emergency Department today with complaint of left lower quadrant abdominal pain. Patient reports associated nausea but denies any emesis. Patient is overall well-appearing. He is nontoxic. Vital signs are reassuring. Patient is afebrile. No tachycardia or hypotension noted.  The patient does have tenderness to palpation in the left lower quadrant without any signs of rebound or  peritonitis.  Laboratory reveals a mild cytosis of 15,000. All other labs are baseline. CT scan obtained shows unconjugated diverticulitis of the descending colon and sigmoid colon.  Patient has been given his first dose of antibiotics in the ED. He is tolerating by mouth fluids or any difficulties. Pain has been managed in the ED. Patient has refused pain medicine at this time. Repeat abdominal exam shows no signs of peritonitis.  Given the patient's diverticulitis uncomplicated, he is afebrile in the ED and able tolerate by mouth fluids. The patient can be discharged home with outpatient treatment. The patient does not meet SIRS or SEPSIS criteria.  Pt is hemodynamically stable, in NAD, & able to ambulate in the ED. Evaluation does not show pathology that would require ongoing emergent intervention or inpatient treatment. I explained the diagnosis to the patient. Pain has been managed & has no complaints prior to dc. Pt is comfortable with above plan and is stable for discharge at this time. All questions were answered prior to disposition.  Strict return precautions for f/u to the ED were discussed. Encouraged follow up with PCP.   Final Clinical Impressions(s) / ED Diagnoses   Final diagnoses:  Diverticulitis    New Prescriptions New Prescriptions   CIPROFLOXACIN (CIPRO) 500 MG TABLET    Take 1 tablet (500 mg total) by mouth 2 (two) times daily.   HYDROCODONE-ACETAMINOPHEN (NORCO/VICODIN) 5-325 MG TABLET    Take 1-2 tablets by mouth every 4 (four) hours as needed.   METRONIDAZOLE (FLAGYL) 500 MG TABLET    Take 1 tablet (500 mg total) by mouth 3 (three) times daily. DO NOT CONSUME ALCOHOL WHILE TAKING THIS MEDICATION.   ONDANSETRON (ZOFRAN-ODT) 8 MG DISINTEGRATING TABLET    Take 1 tablet (8 mg total) by mouth every 8 (eight) hours as needed for nausea.     Doristine Devoid, PA-C 04/09/17 1700    Daleen Bo, MD 04/10/17 [phone]

## 2017-04-09 NOTE — ED Triage Notes (Addendum)
Pt reports LLQ  Shooting pain since yesterday.  Pt states it is a diverticulitis flare up, that he has experienced before.  Pt does not have a GI doctor and has recently changed PCP's.  He reports some diarrhea today and mild nausea.

## 2017-04-09 NOTE — ED Triage Notes (Signed)
Pt presents for evaluation of LLQ pain x 2 days. Pt reports seen at [facility] this AM and sent for further eval. Pt with hx of diverticulitis, states feels the same. Denies vomiting, reports nausea and diarrhea.

## 2017-04-09 NOTE — ED Provider Notes (Signed)
HPI  SUBJECTIVE:  Todd Weeks is a 61 y.o. male who presents with gradual onset nonmigratory, nonradiating left lower quadrant pain starting a day and a half ago. Describes it as stabbing. Reports chills last night. No fevers. He reports nausea, watery diarrhea, states his last bowel movement was this morning. He is tolerating by mouth. Symptoms are worse with movement, states the car ride over here was painful. No alleviating factors. He has not tried anything for this. He denies melena, hematochezia, abdominal distention, back pain. No urinary complaints, testicular pain, rash, penile discharge. No anorexia. Symptoms are not associated with eating, drinking, fasting, urination, defecation. He states this is identical to previous episodes of diverticulitis. Last colonoscopy 5-6 years ago. He has had 2 attacks since his last colonoscopy and never found the opportunity follow-up with GI. He has a past medical history of diverticulitis times 3, coronary disease, hypercholesterolemia, obesity, hypertension, peripheral vascular disease. He is on Plavix. Also BPH. No history of PE, DVT, A. fib, mesenteric ischemia, GI bleed, kidney disease, abdominal surgeries. PMD: Dr. Earleen Newport. GI: None  Last episode of diverticulitis was 10/19/2014 which was treated as an outpatient with Flagyl and Cipro. Was told to follow-up with GI.   Past Medical History:  Diagnosis Date  . Bell's palsy   . Depression   . Diverticulitis   . Headache(784.0)   . HTN (hypertension)   . PVD (peripheral vascular disease) ([place])   . Sexual dysfunction     Past Surgical History:  Procedure Laterality Date  . FEMORAL BYPASS      Family History  Problem Relation Age of Onset  . Hypertension Mother   . Deep vein thrombosis Father   . Diabetes Brother   . Tuberculosis Maternal Grandfather   . Emphysema Maternal Grandfather   . Lung disease Brother   . Asthma Son     Social History  Substance Use Topics  . Smoking status:  Former Smoker    Packs/day: 1.50    Years: 46.00    Types: Cigarettes    Quit date: 12/12/2011  . Smokeless tobacco: Never Used  . Alcohol use 0.6 oz/week    1 Cans of beer per week     Comment: a couple of beer on a weekend    No current facility-administered medications for this encounter.   Current Outpatient Prescriptions:  .  cholecalciferol (VITAMIN D) 1000 units tablet, Take 1,000 Units by mouth 2 times daily at 12 noon and 4 pm., Disp: , Rfl:  .  clopidogrel (PLAVIX) 75 MG tablet, Take 1 tablet (75 mg total) by mouth daily., Disp: 30 tablet, Rfl: 11 .  famotidine (PEPCID) 20 MG tablet, One at bedtime, Disp: 30 tablet, Rfl: 11 .  irbesartan (AVAPRO) 300 MG tablet, Take 1 tablet (300 mg total) by mouth daily., Disp: 30 tablet, Rfl: 11 .  Multiple Vitamins-Minerals (MULTIVITAMIN WITH MINERALS) tablet, Take 1 tablet by mouth daily., Disp: , Rfl:  .  gabapentin (NEURONTIN) 100 MG capsule, Take 1 capsule (100 mg total) by mouth 3 (three) times daily., Disp: 90 capsule, Rfl: 3  No Known Allergies   ROS  As noted in HPI.   Physical Exam  BP 138/88 (BP Location: Right Arm)   Pulse 89   Temp 98.6 F (37 C) (Oral)   SpO2 94%   Constitutional: Well developed, well nourished, no acute distress Eyes:  EOMI, conjunctiva normal bilaterally HENT: Normocephalic, atraumatic,mucus membranes moist Respiratory: Normal inspiratory effort Cardiovascular: Normal rate GI:  obese, nondistended.  Active bowel sounds. Positive left lower quadrant tenderness with voluntary guarding, rebound. Data from [place], negative Murphy . Positive tap Table test. Back: No CVA tenderness in: No rash, skin intact Musculoskeletal: no deformities Neurologic: Alert & oriented x 3, no focal neuro deficits Psychiatric: Speech and behavior appropriate   ED Course   Medications - No data to display  No orders of the defined types were placed in this encounter.   No results found for this or any previous  visit (from the past 24 hour(s)). No results found.  ED Clinical Impression  Left lower quadrant pain   ED Assessment/Plan  Outside records reviewed. As noted in history of present illness.  Concern for perforated diverticula given the rebound, guarding and pain with movement. Sending to the ED for comprehensive workup. Feel that patient is stable to go by private vehicle. Discussed rationale for transfer to the ED. He agrees with plan and will go there immediately.    No orders of the defined types were placed in this encounter.   *This clinic note was created using Dragon dictation software. Therefore, there may be occasional mistakes despite careful proofreading.  ?   Melynda Ripple, MD 04/09/17 1041

## 2017-05-01 NOTE — Telephone Encounter (Signed)
Left pt message asking to call Ebony Hail back directly at [phone] to schedule AWV + labs with Cassie and CPE with PCP.  *NOTE*Correction-- Last AWV 08/02/16; needs to be scheduled 08/03/17 or after

## 2017-08-12 ENCOUNTER — Other Ambulatory Visit: Payer: PPO

## 2017-08-12 ENCOUNTER — Encounter: Payer: Self-pay | Admitting: *Deleted

## 2017-08-12 ENCOUNTER — Ambulatory Visit (INDEPENDENT_AMBULATORY_CARE_PROVIDER_SITE_OTHER): Payer: PPO | Admitting: *Deleted

## 2017-08-12 VITALS — BP 138/82 | HR 73 | Resp 16 | Ht 72.0 in | Wt 288.4 lb

## 2017-08-12 DIAGNOSIS — Z0000 Encounter for general adult medical examination without abnormal findings: Secondary | ICD-10-CM | POA: Diagnosis not present

## 2017-08-12 NOTE — Progress Notes (Signed)
Subjective:   Todd Weeks is a 60 y.o. male who presents for Medicare Annual/Subsequent preventive examination.  Lives in single story family home alone.  Review of Systems:  No ROS.  Medicare Wellness Visit. Additional risk factors are reflected in the social history.  Sleeps 6-7 hrs/night. Gets 1-5 times/night to use restroom. Has seen urologist for this.   Cardiac Risk Factors include: advanced age (>30men, >64 women);dyslipidemia;obesity (BMI >30kg/m2);hypertension;male gender     Objective:    Vitals: BP 138/82 (BP Location: Right Arm, Patient Position: Sitting, Cuff Size: Large)   Pulse 73   Resp 16   Ht 6' (1.829 m)   Wt 288 lb 6.4 oz (130.8 kg)   SpO2 97%   BMI 39.11 kg/m   Body mass index is 39.11 kg/m.  Advanced Directives 08/12/2017 04/09/2017 08/02/2016 08/11/2015 12/13/2014  Does Patient Have a Medical Advance Directive? No No No No No  Would patient like information on creating a medical advance directive? Yes (MAU/Ambulatory/Procedural Areas - Information given) No - Patient declined No - Patient declined No - patient declined information Yes - Educational materials given    Tobacco Social History   Tobacco Use  Smoking Status Former Smoker  . Packs/day: 1.50  . Years: 46.00  . Pack years: 69.00  . Types: Cigarettes  . Last attempt to quit: 12/12/2011  . Years since quitting: 5.6  Smokeless Tobacco Never Used     Counseling given: Not Answered   Past Medical History:  Diagnosis Date  . Bell's palsy   . Depression   . Diverticulitis   . Headache(784.0)   . HTN (hypertension)   . PVD (peripheral vascular disease) ([place])   . Sexual dysfunction    Past Surgical History:  Procedure Laterality Date  . FEMORAL BYPASS     Family History  Problem Relation Age of Onset  . Hypertension Mother   . Deep vein thrombosis Father   . Diabetes Brother   . Tuberculosis Maternal Grandfather   . Emphysema Maternal Grandfather   . Lung disease Brother   .  Asthma Son    Social History   Socioeconomic History  . Marital status: Single    Spouse name: None  . Number of children: 2  . Years of education: 65  . Highest education level: None  Social Needs  . Financial resource strain: None  . Food insecurity - worry: None  . Food insecurity - inability: None  . Transportation needs - medical: None  . Transportation needs - non-medical: None  Occupational History  . Occupation: Electrical engineer  . Occupation: Unemployed    Comment: disability  Tobacco Use  . Smoking status: Former Smoker    Packs/day: 1.50    Years: 46.00    Pack years: 69.00    Types: Cigarettes    Last attempt to quit: 12/12/2011    Years since quitting: 5.6  . Smokeless tobacco: Never Used  Substance and Sexual Activity  . Alcohol use: Yes    Alcohol/week: 0.6 oz    Types: 1 Cans of beer per week    Comment: a couple of beer on a weekend  . Drug use: Yes    Types: Cocaine, Marijuana, "Crack" cocaine    Comment: not since 1999  . Sexual activity: None  Other Topics Concern  . None  Social History Narrative   Raised in [place], [place]. Does not have any religious beliefs that would effect healthcare. Lives in house with sister. Likes to ride motorcycle  for fun.    Moving out on his own; in the next couple of weeks will be moving back home that he had rented      [place]; with a helmet    Outpatient Encounter Medications as of 08/12/2017  Medication Sig  . clopidogrel (PLAVIX) 75 MG tablet Take 1 tablet (75 mg total) by mouth daily.  . famotidine (PEPCID) 20 MG tablet One at bedtime (Patient taking differently: Take 20 mg by mouth at bedtime. One at bedtime)  . irbesartan (AVAPRO) 300 MG tablet Take 1 tablet (300 mg total) by mouth daily.  . simvastatin (ZOCOR) 20 MG tablet Take 20 mg by mouth at bedtime.  . cholecalciferol (VITAMIN D) 1000 units tablet Take 1,000 Units by mouth daily.   Marland Kitchen gabapentin (NEURONTIN) 100 MG capsule Take 1 capsule (100 mg  total) by mouth 3 (three) times daily. (Patient not taking: Reported on 08/12/2017)  . [DISCONTINUED] ciprofloxacin (CIPRO) 500 MG tablet Take 1 tablet (500 mg total) by mouth 2 (two) times daily. (Patient not taking: Reported on 08/12/2017)  . [DISCONTINUED] HYDROcodone-acetaminophen (NORCO/VICODIN) 5-325 MG tablet Take 1-2 tablets by mouth every 4 (four) hours as needed. (Patient not taking: Reported on 08/12/2017)  . [DISCONTINUED] metroNIDAZOLE (FLAGYL) 500 MG tablet Take 1 tablet (500 mg total) by mouth 3 (three) times daily. DO NOT CONSUME ALCOHOL WHILE TAKING THIS MEDICATION. (Patient not taking: Reported on 08/12/2017)  . [DISCONTINUED] Multiple Vitamins-Minerals (MULTIVITAMIN WITH MINERALS) tablet Take 1 tablet by mouth daily.  . [DISCONTINUED] ondansetron (ZOFRAN-ODT) 8 MG disintegrating tablet Take 1 tablet (8 mg total) by mouth every 8 (eight) hours as needed for nausea. (Patient not taking: Reported on 08/12/2017)   No facility-administered encounter medications on file as of 08/12/2017.     Activities of Daily Living In your present state of health, do you have any difficulty performing the following activities: 08/12/2017  Hearing? N  Vision? N  Difficulty concentrating or making decisions? N  Walking or climbing stairs? N  Dressing or bathing? N  Doing errands, shopping? N  Preparing Food and eating ? N  Using the Toilet? N  In the past six months, have you accidently leaked urine? N  Do you have problems with loss of bowel control? N  Managing your Medications? N  Managing your Finances? N  Housekeeping or managing your Housekeeping? N  Some recent data might be hidden    Patient Care Team: Briscoe Deutscher, DO as PCP - General (Family Medicine) Burnell Blanks, MD as Consulting Physician (Cardiology) Elam Dutch, MD as Consulting Physician (Vascular Surgery) Deneise Lever, MD as Consulting Physician (Pulmonary Disease) Franchot Gallo, MD as  Consulting Physician (Urology)   Assessment:   This is a routine wellness examination for [place].  Exercise Activities and Dietary recommendations Current Exercise Habits: The patient does not participate in regular exercise at present  Eats 2 meals/day. Half is take out. States that he does not pick the best options. Drinks 2 cups water/day. Drinks iced coffee and sports drinks.   Goals    . Weight (lb) < 260 lb (117.9 kg)     Would like to decrease weight to ultimate goal of 220 by increasing activity (joining [employer]).  Increase mobility.        Fall Risk Fall Risk  08/12/2017 01/21/2017 08/02/2016 08/11/2015 12/13/2014  Falls in the past year? No No No No No   Depression Screen PHQ 2/9 Scores 08/12/2017 01/21/2017 08/02/2016 08/11/2015  PHQ - 2 Score 1 0  2 3  PHQ- 9 Score 1 - 12 13  PHQ2 and PHQ9 completed. Patient states that he does have some feelings of depression. He states that as he has gotten older he is unable to do the same things. PHQ score 9 is 1. Total time spent on this topic was 10 minutes.  Cognitive Function MMSE - Mini Mental State Exam 12/13/2014  Not completed: Unable to complete  Ad8 score reviewed for issues:  Issues making decisions: no  Less interest in hobbies / activities:no  Repeats questions, stories (family complaining):no  Trouble using ordinary gadgets (microwave, computer, phone):no  Forgets the month or year: no  Mismanaging finances: no  Remembering appts:no  Daily problems with thinking and/or memory:no Ad8 score is=0 Suggested introducing brain stimulating activities.       Immunization History  Administered Date(s) Administered  . Influenza Split 05/14/2015  . Influenza,inj,Quad PF,6+ Mos 08/02/2016  . Tdap 12/13/2014   Screening Tests Health Maintenance  Topic Date Due  . COLONOSCOPY  08/13/2020  . TETANUS/TDAP  12/12/2024  . INFLUENZA VACCINE  Completed  . Hepatitis C Screening  Completed  . HIV Screening  Completed       Plan:   Follow up with PCP as directed.  I have personally reviewed and noted the following in the patient's chart:   . Medical and social history . Use of alcohol, tobacco or illicit drugs  . Current medications and supplements . Functional ability and status . Nutritional status . Physical activity . Advanced directives . List of other physicians . Vitals . Screenings to include cognitive, depression, and falls . Referrals and appointments  In addition, I have reviewed and discussed with patient certain preventive protocols, quality metrics, and best practice recommendations. A written personalized care plan for preventive services as well as general preventive health recommendations were provided to patient.     Williemae Area, RN  08/12/2017

## 2017-08-12 NOTE — Progress Notes (Signed)
Pre visit review using our clinic review tool, if applicable. No additional management support is needed unless otherwise documented below in the visit note. 

## 2017-08-12 NOTE — Patient Instructions (Signed)
Todd Weeks ,  Bring a copy of your living will and/or healthcare power of attorney to your next office visit.  Thank you for taking time to come for your Medicare Wellness Visit. I appreciate your ongoing commitment to your health goals. Please review the following plan we discussed and let me know if I can assist you in the future.   These are the goals we discussed: Goals    . Weight (lb) < 260 lb (117.9 kg)     Would like to decrease weight to ultimate goal of 220 by increasing activity (joining [employer]).  Increase mobility.        This is a list of the screening recommended for you and due dates:  Health Maintenance  Topic Date Due  . Colon Cancer Screening  08/13/2020  . Tetanus Vaccine  12/12/2024  . Flu Shot  Completed  .  Hepatitis C: One time screening is recommended by Center for Disease Control  (CDC) for  adults born from [birth year] through 1966.   Completed  . HIV Screening  Completed   Preventive Care for Adults  A healthy lifestyle and preventive care can promote health and wellness. Preventive health guidelines for adults include the following key practices.  . A routine yearly physical is a good way to check with your health care provider about your health and preventive screening. It is a chance to share any concerns and updates on your health and to receive a thorough exam.  . Visit your dentist for a routine exam and preventive care every 6 months. Brush your teeth twice a day and floss once a day. Good oral hygiene prevents tooth decay and gum disease.  . The frequency of eye exams is based on your age, health, family medical history, use  of contact lenses, and other factors. Follow your health care provider's recommendations for frequency of eye exams.  . Eat a healthy diet. Foods like vegetables, fruits, whole grains, low-fat dairy products, and lean protein foods contain the nutrients you need without too many calories. Decrease your intake of foods high in solid  fats, added sugars, and salt. Eat the right amount of calories for you. Get information about a proper diet from your health care provider, if necessary.  . Regular physical exercise is one of the most important things you can do for your health. Most adults should get at least 150 minutes of moderate-intensity exercise (any activity that increases your heart rate and causes you to sweat) each week. In addition, most adults need muscle-strengthening exercises on 2 or more days a week.  Silver Sneakers may be a benefit available to you. To determine eligibility, you may visit the website: www.silversneakers.com or contact program at [phone] Mon-Fri between 8AM-8PM.   . Maintain a healthy weight. The body mass index (BMI) is a screening tool to identify possible weight problems. It provides an estimate of body fat based on height and weight. Your health care provider can find your BMI and can help you achieve or maintain a healthy weight.   For adults 20 years and older: ? A BMI below 18.5 is considered underweight. ? A BMI of 18.5 to 24.9 is normal. ? A BMI of 25 to 29.9 is considered overweight. ? A BMI of 30 and above is considered obese.   . Maintain normal blood lipids and cholesterol levels by exercising and minimizing your intake of saturated fat. Eat a balanced diet with plenty of fruit and vegetables. Blood tests for lipids  and cholesterol should begin at age 65 and be repeated every 5 years. If your lipid or cholesterol levels are high, you are over 50, or you are at high risk for heart disease, you may need your cholesterol levels checked more frequently. Ongoing high lipid and cholesterol levels should be treated with medicines if diet and exercise are not working.  . If you smoke, find out from your health care provider how to quit. If you do not use tobacco, please do not start.  . If you choose to drink alcohol, please do not consume more than 2 drinks per day. One drink is  considered to be 12 ounces (355 mL) of beer, 5 ounces (148 mL) of wine, or 1.5 ounces (44 mL) of liquor.  . If you are 31-68 years old, ask your health care provider if you should take aspirin to prevent strokes.  . Use sunscreen. Apply sunscreen liberally and repeatedly throughout the day. You should seek shade when your shadow is shorter than you. Protect yourself by wearing Connelly sleeves, pants, a wide-brimmed hat, and sunglasses year round, whenever you are outdoors.  . Once a month, do a whole body skin exam, using a mirror to look at the skin on your back. Tell your health care provider of new moles, moles that have irregular borders, moles that are larger than a pencil eraser, or moles that have changed in shape or color.

## 2017-08-12 NOTE — Progress Notes (Signed)
PCP notes:   Health maintenance: NA.   Abnormal screenings:    Patient concerns: Loosing strength in hands, hx of arthritis in family. Difficulty opening bottles and gripping things. States that this has gradually gotten worse, states that it is now in both hands. States he has seen sports medicine before.    Nurse concerns:   Next PCP appt: 08/26/2017

## 2017-08-12 NOTE — Progress Notes (Signed)
I have personally reviewed the Medicare Annual Wellness questionnaire and have noted 1. The patient's medical and social history 2. Their use of alcohol, tobacco or illicit drugs 3. Their current medications and supplements 4. The patient's functional ability including ADL's, fall risks, home safety risks and hearing or visual impairment. 5. Diet and physical activities 6. Evidence for depression or mood disorders 7. Reviewed Updated provider list, see scanned forms and [facility] Snapshot.   The patients weight, height, BMI and visual acuity have been recorded in the chart I have made referrals, counseling and provided education to the patient based review of the above and I have provided the pt with a written personalized care plan for preventive services.  I have provided the patient with a copy of your personalized plan for preventive services. Instructed to take the time to review along with their updated medication list.  Todd Weeks  

## 2017-08-26 ENCOUNTER — Encounter: Payer: PPO | Admitting: Family Medicine

## 2017-09-02 ENCOUNTER — Encounter: Payer: Self-pay | Admitting: Family Medicine

## 2017-09-02 ENCOUNTER — Ambulatory Visit (INDEPENDENT_AMBULATORY_CARE_PROVIDER_SITE_OTHER): Payer: PPO | Admitting: Family Medicine

## 2017-09-02 VITALS — BP 140/80 | HR 73 | Temp 97.7°F | Wt 292.0 lb

## 2017-09-02 DIAGNOSIS — Z0000 Encounter for general adult medical examination without abnormal findings: Secondary | ICD-10-CM

## 2017-09-02 DIAGNOSIS — I10 Essential (primary) hypertension: Secondary | ICD-10-CM

## 2017-09-02 DIAGNOSIS — I70219 Atherosclerosis of native arteries of extremities with intermittent claudication, unspecified extremity: Secondary | ICD-10-CM | POA: Diagnosis not present

## 2017-09-02 DIAGNOSIS — I2510 Atherosclerotic heart disease of native coronary artery without angina pectoris: Secondary | ICD-10-CM

## 2017-09-02 DIAGNOSIS — E88819 Insulin resistance, unspecified: Secondary | ICD-10-CM

## 2017-09-02 DIAGNOSIS — M19049 Primary osteoarthritis, unspecified hand: Secondary | ICD-10-CM | POA: Diagnosis not present

## 2017-09-02 DIAGNOSIS — E559 Vitamin D deficiency, unspecified: Secondary | ICD-10-CM

## 2017-09-02 DIAGNOSIS — E782 Mixed hyperlipidemia: Secondary | ICD-10-CM

## 2017-09-02 DIAGNOSIS — E8881 Metabolic syndrome: Secondary | ICD-10-CM | POA: Diagnosis not present

## 2017-09-02 DIAGNOSIS — R202 Paresthesia of skin: Secondary | ICD-10-CM

## 2017-09-02 LAB — COMPREHENSIVE METABOLIC PANEL
ALT: 24 U/L (ref 0–53)
AST: 16 U/L (ref 0–37)
Albumin: 4.4 g/dL (ref 3.5–5.2)
Alkaline Phosphatase: 72 U/L (ref 39–117)
BUN: 17 mg/dL (ref 6–23)
CO2: 28 mEq/L (ref 19–32)
Calcium: 9.1 mg/dL (ref 8.4–10.5)
Chloride: 103 mEq/L (ref 96–112)
Creatinine, Ser: 0.56 mg/dL (ref 0.40–1.50)
GFR: 156.64 mL/min (ref >60.00)
Glucose, Bld: 99 mg/dL (ref 70–99)
Potassium: 4.8 mEq/L (ref 3.5–5.1)
Sodium: 137 mEq/L (ref 135–145)
Total Bilirubin: 0.4 mg/dL (ref 0.2–1.2)
Total Protein: 6.7 g/dL (ref 6.0–8.3)

## 2017-09-02 LAB — CBC WITH DIFFERENTIAL/PLATELET
Basophils Absolute: 0 10*3/uL (ref 0.0–0.1)
Basophils Relative: 0.5 % (ref 0.0–3.0)
Eosinophils Absolute: 0.3 10*3/uL (ref 0.0–0.7)
Eosinophils Relative: 3.5 % (ref 0.0–5.0)
HCT: 44.8 % (ref 39.0–52.0)
Hemoglobin: 14.9 g/dL (ref 13.0–17.0)
Lymphocytes Relative: 25.8 % (ref 12.0–46.0)
Lymphs Abs: 2 10*3/uL (ref 0.7–4.0)
MCHC: 33.4 g/dL (ref 30.0–36.0)
MCV: 88.8 fl (ref 78.0–100.0)
Monocytes Absolute: 0.8 10*3/uL (ref 0.1–1.0)
Monocytes Relative: 9.8 % (ref 3.0–12.0)
Neutro Abs: 4.8 10*3/uL (ref 1.4–7.7)
Neutrophils Relative %: 60.4 % (ref 43.0–77.0)
Platelets: 231 10*3/uL (ref 150.0–400.0)
RBC: 5.04 Mil/uL (ref 4.22–5.81)
RDW: 13.5 % (ref 11.5–15.5)
WBC: 7.9 10*3/uL (ref 4.0–10.5)

## 2017-09-02 LAB — LIPID PANEL
Cholesterol: 161 mg/dL (ref 0–200)
HDL: 39.7 mg/dL (ref >39.00)
LDL Cholesterol: 86 mg/dL (ref 0–99)
NonHDL: 120.82
Total CHOL/HDL Ratio: 4
Triglycerides: 172 mg/dL — ABNORMAL HIGH (ref 0.0–149.0)
VLDL: 34.4 mg/dL (ref 0.0–40.0)

## 2017-09-02 LAB — TSH: TSH: 2.39 u[IU]/mL (ref 0.35–4.50)

## 2017-09-02 LAB — HEMOGLOBIN A1C: Hgb A1c MFr Bld: 5.7 % (ref 4.6–6.5)

## 2017-09-02 LAB — VITAMIN D 25 HYDROXY (VIT D DEFICIENCY, FRACTURES): VITD: 20.61 ng/mL — ABNORMAL LOW (ref 30.00–100.00)

## 2017-09-02 NOTE — Progress Notes (Signed)
Patient Care Team: Briscoe Deutscher, DO as PCP - General (Family Medicine) Burnell Blanks, MD as Consulting Physician (Cardiology) Elam Dutch, MD as Consulting Physician (Vascular Surgery) Deneise Lever, MD as Consulting Physician (Pulmonary Disease) Charlott Holler (Physician Assistant) Franchot Gallo, MD as Consulting Physician (Urology)   Subjective:    Momodou Consiglio Crystal is a 62 y.o. male who presents today for his Complete Annual Exam. He feels fairly well. He reports exercising causes lower extremity pain. He reports he is sleeping fairly well.   There are no preventive care reminders to display for this patient.  PMHx, SurgHx, SocialHx, Medications, and Allergies were reviewed in the Visit Navigator and updated as appropriate.   Past Medical History:  Diagnosis Date  . Bell's palsy   . Depression   . Diverticulitis   . Headache(784.0)   . HTN (hypertension)   . PVD (peripheral vascular disease) ([place])   . Sexual dysfunction     Past Surgical History:  Procedure Laterality Date  . FEMORAL BYPASS      Family History  Problem Relation Age of Onset  . Hypertension Mother   . Deep vein thrombosis Father   . Diabetes Brother   . Tuberculosis Maternal Grandfather   . Emphysema Maternal Grandfather   . Lung disease Brother   . Asthma Son    Social History   Tobacco Use  . Smoking status: Former Smoker    Packs/day: 1.50    Years: 46.00    Pack years: 69.00    Types: Cigarettes    Last attempt to quit: 12/12/2011    Years since quitting: 5.7  . Smokeless tobacco: Never Used  Substance Use Topics  . Alcohol use: Yes    Alcohol/week: 0.6 oz    Types: 1 Cans of beer per week    Comment: a couple of beer on a weekend  . Drug use: Yes    Types: Cocaine, Marijuana, "Crack" cocaine    Comment: not since 1999   Review of Systems:   Pertinent items are noted in the HPI. Otherwise, ROS is negative.  Objective:    Vitals:   09/02/17 1316   BP: 140/80  Pulse: 73  Temp: 97.7 F (36.5 C)  SpO2: 96%   Body mass index is 39.6 kg/m.  General  Alert, cooperative, no distress, appears stated age  Head:  Normocephalic, without obvious abnormality, atraumatic  Eyes:  PERRL, conjunctiva/corneas clear, EOM's intact, fundi benign, both eyes       Ears:  Normal TM's and external ear canals, both ears  Nose: Nares normal, septum midline, mucosa normal, no drainage or sinus tenderness  Throat: Lips, mucosa, and tongue normal; teeth and gums normal  Neck: Supple, symmetrical, trachea midline, no adenopathy; thyroid: no enlargement/tenderness/nodules; no carotid bruit or JVD  Back:   Symmetric, no curvature, ROM normal, no CVA tenderness  Lungs:   Clear to auscultation bilaterally, respirations unlabored  Chest Wall:  No tenderness or deformity  Heart:  Regular rate and rhythm, S1 and S2 normal  Abdomen:   Soft, non-tender, bowel sounds active all four quadrants, no masses, no organomegaly  Extremities: Extremities normal, atraumatic, no cyanosis or edema  Prostate: Not done.   Skin: Skin color, texture, turgor normal. Left upper arm wart vs AK.  Lymph: Cervical, supraclavicular, and axillary nodes normal  Neurologic: CNII-XII grossly intact. Normal strength, sensation and reflexes throughout  MSK: Weakness of bilateral hands with grip. Bilateral CMC joint ttp.   AssessmentPlan:  Leonid was seen today for hand pain.  Diagnoses and all orders for this visit:  Routine physical examination  Mixed hyperlipidemia -     Lipid panel  Essential hypertension -     Comprehensive metabolic panel  Severe obesity (BMI >= 40) (HCC) -     Hemoglobin A1c -     TSH  Atherosclerosis of native artery of lower extremity with intermittent claudication, unspecified laterality ([place]) Comments: The patient continues to have claudication. He stopped smoking in 2013. History of RLE stent. He has not followed up with Vascular. I encouraged  exercise and for him to f/u with Vascular to review options.   Coronary artery disease involving native coronary artery of native heart without angina pectoris Comments: I encouraged check-in with Cardiology. Labs pending.   The 10-year ASCVD risk score Mikey Bussing DC Brooke Bonito., et al., 2013) is: 13.3%   Values used to calculate the score:     Age: 89 years     Sex: Male     Is Non-Hispanic African American: No     Diabetic: No     Tobacco smoker: No     Systolic Blood Pressure: 956 mmHg     Is BP treated: Yes     HDL Cholesterol: 39.7 mg/dL     Total Cholesterol: 161 mg/dL  Vitamin D deficiency -     VITAMIN D 25 Hydroxy (Vit-D Deficiency, Fractures)  Paresthesias -     CBC with Differential/Platelet -     Hemoglobin A1c -     TSH  Arthritis of hand Comments: Ongoing with some weakness bilaterally. We reviewed treatment options. He would like to wait on intervention at this time.   Patient Counseling: [x]   Nutrition: Stressed importance of moderation in sodium/caffeine intake, saturated fat and cholesterol, caloric balance, sufficient intake of fresh fruits, vegetables, and fiber  [x]   Stressed the importance of regular exercise.   []   Substance Abuse: Discussed cessation/primary prevention of tobacco, alcohol, or other drug use; driving or other dangerous activities under the influence; availability of treatment for abuse.   [x]   Injury prevention: Discussed safety belts, safety helmets, smoke detector, smoking near bedding or upholstery.   []   Sexuality: Discussed sexually transmitted diseases, partner selection, use of condoms, avoidance of unintended pregnancy  and contraceptive alternatives.   [x]   Dental health: Discussed importance of regular tooth brushing, flossing, and dental visits.  [x]   Health maintenance and immunizations reviewed. Please refer to Health maintenance section.    Briscoe Deutscher, DO [place]

## 2017-09-03 DIAGNOSIS — E8881 Metabolic syndrome: Secondary | ICD-10-CM | POA: Insufficient documentation

## 2017-09-03 DIAGNOSIS — E88819 Insulin resistance, unspecified: Secondary | ICD-10-CM | POA: Insufficient documentation

## 2017-09-03 MED ORDER — CHOLECALCIFEROL 1.25 MG (50000 UT) PO TABS: ORAL_TABLET | ORAL | 0 refills | Status: DC

## 2017-09-03 NOTE — Addendum Note (Signed)
Addended by: Briscoe Deutscher R on: 09/03/2017 08:39 PM   Modules accepted: Orders

## 2017-09-09 ENCOUNTER — Other Ambulatory Visit: Payer: Self-pay | Admitting: Internal Medicine

## 2017-09-09 NOTE — Telephone Encounter (Signed)
Routing to PCP - please advise

## 2017-09-09 NOTE — Telephone Encounter (Signed)
Refill all x 3 months.

## 2017-09-10 ENCOUNTER — Other Ambulatory Visit: Payer: Self-pay | Admitting: Family Medicine

## 2017-09-11 NOTE — Telephone Encounter (Signed)
Patient called to request a 90 day supply of clopidogrel and wants to know why all of the scripts called in from 09/03/2017-09/09/2017 were not 90 days supply?

## 2017-09-11 NOTE — Telephone Encounter (Signed)
Please advise 

## 2017-10-13 ENCOUNTER — Other Ambulatory Visit: Payer: Self-pay | Admitting: Family Medicine

## 2017-10-15 NOTE — Telephone Encounter (Signed)
Called pt needs to make app.

## 2017-11-18 ENCOUNTER — Encounter: Payer: Self-pay | Admitting: Sports Medicine

## 2017-11-18 ENCOUNTER — Ambulatory Visit (INDEPENDENT_AMBULATORY_CARE_PROVIDER_SITE_OTHER): Payer: PPO | Admitting: Sports Medicine

## 2017-11-18 ENCOUNTER — Ambulatory Visit (INDEPENDENT_AMBULATORY_CARE_PROVIDER_SITE_OTHER): Payer: PPO

## 2017-11-18 VITALS — BP 138/86 | HR 66 | Ht 72.0 in | Wt 283.2 lb

## 2017-11-18 DIAGNOSIS — M25572 Pain in left ankle and joints of left foot: Secondary | ICD-10-CM

## 2017-11-18 DIAGNOSIS — G8929 Other chronic pain: Secondary | ICD-10-CM

## 2017-11-18 NOTE — Procedures (Signed)
X-Rays obtained at [facility] Interpreted by myself Gerda Diss, DO) during office visit.  Results were reviewed with the patient at the time of the visit.   3 VIEW X-RAY of: Left ankle  FINDINGS:  Well aligned ankle joint with mild osteophytic spurring along the medial compartment joint lines.  No significant syndesmotic widening.   IMPRESSION:  Mild ankle arthritis

## 2017-11-18 NOTE — Patient Instructions (Signed)
I recommend you obtained a compression sleeve to help with your joint problems. There are many options on the market however I recommend obtaining a L ankle Body Helix compression sleeve.  You can find information (including how to appropriate measure yourself for sizing) can be found at www.Body http://www.lambert.com/.  Many of these products are health savings account (HSA) eligible.   You can use the compression sleeve at any time throughout the day but is most important to use while being active as well as for 2 hours post-activity.   It is appropriate to ice following activity with the compression sleeve in place.

## 2017-11-18 NOTE — Progress Notes (Signed)
Todd Weeks. Todd Weeks at [facility] [phone]  Todd Weeks - 62 y.o. male MRN [id number]  Date of birth: Jan 03, 1956  Visit Date: 11/18/2017  PCP: Briscoe Deutscher, DO   Referred by: Briscoe Deutscher, DO  Scribe for today's visit: Josepha Pigg, CMA     SUBJECTIVE:  Todd Weeks is here for L ankle pain  His L ankle pain symptoms INITIALLY: Began about 4 weeks ago and MOI is unknown.  Described as moderate aching, nonradiating Worsened with weight bearing. Worse when first getting up.  Improved with rest Additional associated symptoms include: Pt denies swelling, erythema, increased warmth. Pain is mostly on the top of the foot and anterior aspect of the ankle.     At this time symptoms are worsening compared to onset  He has been taking Tylenol 1500 mg with some relief. He did notice some improvement in pain after resting for a couple of days and trying to stay off of his feet.   ROS Denies night time disturbances. Denies fevers, chills, or night sweats. Denies unexplained weight loss. Denies personal history of cancer. Denies changes in bowel or bladder habits. Denies recent unreported falls. Denies new or worsening dyspnea or wheezing. Denies headaches or dizziness.  Reports numbness, tingling or weakness  In lower the extremities.  Denies dizziness or presyncopal episodes Denies lower extremity edema    HISTORY & PERTINENT PRIOR DATA:  Prior History reviewed and updated per electronic medical record.  Significant/pertinent history, findings, studies include:  reports that he quit smoking about 6 years ago. His smoking use included cigarettes. He has a 69.00 pack-year smoking history. He has never used smokeless tobacco. Recent Labs    09/02/17 1345  HGBA1C 5.7   The 10-year ASCVD risk score Mikey Bussing DC Jr., et al., 2013) is: 13.3%   Values used to calculate the score:     Age: 70 years     Sex: Male     Is  Non-Hispanic African American: No     Diabetic: No     Tobacco smoker: No     Systolic Blood Pressure: 580 mmHg     Is BP treated: Yes     HDL Cholesterol: 39.7 mg/dL     Total Cholesterol: 161 mg/dL No problems updated.  OBJECTIVE:  VS:  HT:6' (182.9 cm)   WT:283 lb 3.2 oz (128.5 kg)  BMI:38.4    BP:138/86  HR:66bpm  TEMP: ( )  RESP:94 %   PHYSICAL EXAM: Constitutional: WDWN, Non-toxic appearing. Psychiatric: Alert & appropriately interactive.  Not depressed or anxious appearing. Respiratory: No increased work of breathing.  Trachea Midline Eyes: Pupils are equal.  EOM intact without nystagmus.  No scleral icterus  Vascular Exam: warm to touch no edema  lower extremity neuro exam: unremarkable  MSK Exam: Left ankle is overall well aligned.  No significant deformity.  He has a small degree of pain along the anterior and medial ankle.  Ankle drawer testing is stable.  Intrinsic ankle strength is intact.  He does have this some pain with talar tilting but solid endpoints.   ASSESSMENT & PLAN:   1. Chronic pain of left ankle     PLAN: Discussed over-the-counter Tylenol and compression for his ankle given the mild osteoarthritic changes.  We discussed that ankle injection is a possibility but he would like to defer this as time.  He will follow-up as needed. Follow-up: Return if symptoms worsen or fail to improve.  Please see additional documentation for Objective, Assessment and Plan sections. Pertinent additional documentation may be included in corresponding procedure notes, imaging studies, problem based documentation and patient instructions. Please see these sections of the encounter for additional information regarding this visit.  CMA/ATC served as Education administrator during this visit. History, Physical, and Plan performed by medical provider. Documentation and orders reviewed and attested to.      Gerda Diss, [facility] Physician

## 2017-12-08 ENCOUNTER — Other Ambulatory Visit: Payer: Self-pay | Admitting: Family Medicine

## 2018-01-25 ENCOUNTER — Other Ambulatory Visit: Payer: Self-pay | Admitting: Family Medicine

## 2018-01-27 ENCOUNTER — Ambulatory Visit: Payer: PPO | Admitting: Sports Medicine

## 2018-01-27 NOTE — Telephone Encounter (Signed)
Please advise on refill.

## 2018-02-24 ENCOUNTER — Telehealth: Payer: Self-pay

## 2018-02-24 NOTE — Telephone Encounter (Signed)
Received parking placard to be filled out for patient in green folder for your review.

## 2018-02-25 NOTE — Telephone Encounter (Signed)
Completed and placed in red folder. Also, patient needs to schedule follow up visit.

## 2018-02-27 NOTE — Telephone Encounter (Signed)
Sent to reception for them to call and make f/u and let patient know he can pick up.

## 2018-03-30 NOTE — Progress Notes (Signed)
Todd Weeks is a 62 y.o. male is here for follow up.  History of Present Illness:   HPI: See Assessment and Plan section for Problem Based Charting of issues discussed today.   Health Maintenance Due  Topic Date Due  . INFLUENZA VACCINE  03/13/2018   Depression screen [facility] 2/9 08/12/2017 01/21/2017 08/02/2016  Decreased Interest 0 0 1  Down, Depressed, Hopeless 1 0 1  PHQ - 2 Score 1 0 2  Altered sleeping 0 - 3  Tired, decreased energy 0 - 3  Change in appetite 0 - 2  Feeling bad or failure about yourself  0 - 2  Trouble concentrating 0 - 0  Moving slowly or fidgety/restless 0 - 0  Suicidal thoughts 0 - 0  PHQ-9 Score 1 - 12  Difficult doing work/chores Not difficult at all - Not difficult at all   PMHx, SurgHx, SocialHx, FamHx, Medications, and Allergies were reviewed in the Visit Navigator and updated as appropriate.   Patient Active Problem List   Diagnosis Date Noted  . Insulin resistance 09/03/2017  . Numbness of right anterior thigh 01/21/2017  . Lesion or mass of paranasal sinuses 01/18/2016  . Severe obesity (BMI >= 40) ([place]) 10/29/2015  . Subacromial bursitis 06/01/2015  . Hyperlipidemia 05/04/2015  . Atherosclerosis of native arteries of extremity with intermittent claudication ([place]) 05/22/2012  . CAD (coronary artery disease) 02/02/2011  . Abnormal cardiovascular function study 01/04/2011  . Elevated PSA 08/06/2007  . Former smoker 08/01/2007  . HTN (hypertension) 08/01/2007   Social History   Tobacco Use  . Smoking status: Former Smoker    Packs/day: 1.50    Years: 46.00    Pack years: 69.00    Types: Cigarettes    Last attempt to quit: 12/12/2011    Years since quitting: 6.3  . Smokeless tobacco: Never Used  Substance Use Topics  . Alcohol use: Yes    Alcohol/week: 1.0 standard drinks    Types: 1 Cans of beer per week    Comment: a couple of beer on a weekend  . Drug use: Yes    Types: Cocaine, Marijuana, "Crack" cocaine    Comment: not since  1999   Current Medications and Allergies:   .  cholecalciferol (VITAMIN D) 1000 units tablet, Take 1,000 Units by mouth daily. , Disp: , Rfl:  .  clopidogrel (PLAVIX) 75 MG tablet, TAKE (1) TABLET BY MOUTH ONCE DAILY., Disp: 90 tablet, Rfl: 1 .  famotidine (PEPCID) 20 MG tablet, TAKE (1) TABLET BY MOUTH AT BEDTIME., Disp: 90 tablet, Rfl: 0 .  irbesartan (AVAPRO) 150 MG tablet, TAKE 2 TABLETS BY MOUTH DAILY., Disp: 180 tablet, Rfl: 0 .  simvastatin (ZOCOR) 20 MG tablet, TAKE 1 TABLET BY MOUTH DAILY AT 6 PM., Disp: 90 tablet, Rfl: 0  No Known Allergies   Review of Systems   Pertinent items are noted in the HPI. Otherwise, ROS is negative.  Vitals:   Vitals:   03/31/18 0704  BP: 128/68  Pulse: (!) 54  Temp: 98.2 F (36.8 C)  TempSrc: Oral  SpO2: 98%  Weight: 263 lb 3.2 oz (119.4 kg)  Height: 6' (1.829 m)     Body mass index is 35.7 kg/m.  Physical Exam:   Physical Exam  Constitutional: He is oriented to person, place, and time. He appears well-developed and well-nourished. No distress.  HENT:  Head: Normocephalic and atraumatic.  Right Ear: External ear normal.  Left Ear: External ear normal.  Nose: Nose normal.  Mouth/Throat: Oropharynx is clear and moist.  Eyes: Pupils are equal, round, and reactive to light. Conjunctivae and EOM are normal.  Neck: Normal range of motion. Neck supple.  Cardiovascular: Normal rate, regular rhythm, normal heart sounds and intact distal pulses.  Pulmonary/Chest: Effort normal and breath sounds normal.  Abdominal: Soft. Bowel sounds are normal.  Musculoskeletal: Normal range of motion.  Neurological: He is alert and oriented to person, place, and time.  Skin: Skin is warm and dry.  Superior to left scapular, puncture. Skin without redness or swelling. When squeezed, cyst material comes out. No purulent material.   Psychiatric: He has a normal mood and affect. His behavior is normal. Judgment and thought content normal.  Nursing note and  vitals reviewed.  Assessment and Plan:   Diagnoses and all orders for this visit:  Essential hypertension Comments: Doing well. Compliant with medications. No CP, SOB, edema. Working out regularly now. Down 20 pounds.   Mixed hyperlipidemia Comments: Wants to recheck labs today after his 20 pounds weight loss. Working on low carb diet.  Orders: -     Comprehensive metabolic panel -     Lipid panel  Severe obesity (BMI >= 40) (HCC) Comments: Discussed ongoing weight loss with low carb, plant based diet. Continue cardio and strength training. STOP HYDROXYCUT. Okay to start Phentermine at 1/2 dose. Recheck for weight and BP at next visit.  Orders: -     phentermine (ADIPEX-P) 37.5 MG tablet; Take 1 tablet (37.5 mg total) by mouth daily before breakfast.  Arthralgia of hand, left Comments: Worsening. Some weakness at times. No numbness/tingling, edema. No Hx of the same. Will trial Mobic. If worsens, will need imaging.  Orders: -     meloxicam (MOBIC) 15 MG tablet; Take 1 tablet (15 mg total) by mouth daily.  Epidermoid cyst Comments: Not identified on exam, but cyst material comes from puncture site when squeezed. If painful, red, itchy - to Dermatology for removal. Likely tunneled.    . Reviewed expectations re: course of current medical issues. . Discussed self-management of symptoms. . Outlined signs and symptoms indicating need for more acute intervention. . Patient verbalized understanding and all questions were answered. Marland Kitchen Health Maintenance issues including appropriate healthy diet, exercise, and smoking avoidance were discussed with patient. . See orders for this visit as documented in the electronic medical record. . Patient received an After Visit Summary.  Briscoe Deutscher, DO [place] [place] [facility] 03/31/2018

## 2018-03-31 ENCOUNTER — Ambulatory Visit (INDEPENDENT_AMBULATORY_CARE_PROVIDER_SITE_OTHER): Payer: PPO | Admitting: Family Medicine

## 2018-03-31 ENCOUNTER — Encounter: Payer: Self-pay | Admitting: Family Medicine

## 2018-03-31 VITALS — BP 128/68 | HR 54 | Temp 98.2°F | Ht 72.0 in | Wt 263.2 lb

## 2018-03-31 DIAGNOSIS — E782 Mixed hyperlipidemia: Secondary | ICD-10-CM

## 2018-03-31 DIAGNOSIS — I10 Essential (primary) hypertension: Secondary | ICD-10-CM

## 2018-03-31 DIAGNOSIS — M25542 Pain in joints of left hand: Secondary | ICD-10-CM | POA: Diagnosis not present

## 2018-03-31 DIAGNOSIS — L720 Epidermal cyst: Secondary | ICD-10-CM | POA: Diagnosis not present

## 2018-03-31 LAB — COMPREHENSIVE METABOLIC PANEL
ALT: 25 U/L (ref 0–53)
AST: 16 U/L (ref 0–37)
Albumin: 4.4 g/dL (ref 3.5–5.2)
Alkaline Phosphatase: 66 U/L (ref 39–117)
BUN: 19 mg/dL (ref 6–23)
CO2: 28 mEq/L (ref 19–32)
Calcium: 9.7 mg/dL (ref 8.4–10.5)
Chloride: 103 mEq/L (ref 96–112)
Creatinine, Ser: 0.76 mg/dL (ref 0.40–1.50)
GFR: 109.91 mL/min (ref >60.00)
Glucose, Bld: 92 mg/dL (ref 70–99)
Potassium: 4 mEq/L (ref 3.5–5.1)
Sodium: 139 mEq/L (ref 135–145)
Total Bilirubin: 0.7 mg/dL (ref 0.2–1.2)
Total Protein: 6.8 g/dL (ref 6.0–8.3)

## 2018-03-31 LAB — LIPID PANEL
Cholesterol: 117 mg/dL (ref 0–200)
HDL: 34.2 mg/dL — ABNORMAL LOW (ref >39.00)
LDL Cholesterol: 69 mg/dL (ref 0–99)
NonHDL: 82.98
Total CHOL/HDL Ratio: 3
Triglycerides: 72 mg/dL (ref 0.0–149.0)
VLDL: 14.4 mg/dL (ref 0.0–40.0)

## 2018-03-31 MED ORDER — PHENTERMINE HCL 37.5 MG PO TABS: 37.5000 mg | ORAL_TABLET | Freq: Every day | ORAL | 2 refills | Status: DC

## 2018-03-31 MED ORDER — MELOXICAM 15 MG PO TABS: 15.0000 mg | ORAL_TABLET | Freq: Every day | ORAL | 0 refills | Status: DC

## 2018-04-24 ENCOUNTER — Ambulatory Visit: Payer: Self-pay | Admitting: *Deleted

## 2018-04-24 NOTE — Telephone Encounter (Signed)
Pt calling with complaints of left side of chest hurting with breathing normally and with taking deep breaths. Pt states he does feel short of breath but denies any other symptoms. Pt states when the pain occurs it only last for a couple of seconds and describes it as a ache. Pt currently rating pain at 7 on a scale of 1-10. Pt states he did lift weights last week but the current pain has been present for the past 3-4 days. Pt advised to seek treatment in the ED at this time. Pt verbalized understanding.  Reason for Disposition . Taking a deep breath makes pain worse  Answer Assessment - Initial Assessment Questions 1. LOCATION: "Where does it hurt?"       Left side of chest 2. RADIATION: "Does the pain go anywhere else?" (e.g., into neck, jaw, arms, back)     Pain behind arm 3. ONSET: "When did the chest pain begin?" (Minutes, hours or days)      3-4 days 4. PATTERN "Does the pain come and go, or has it been constant since it started?"  "Does it get worse with exertion?"      Chest hurst with breathing normally and taking deep breath 5. DURATION: "How Giovannini does it last" (e.g., seconds, minutes, hours)     Last for a couple of secs 6. SEVERITY: "How bad is the pain?"  (e.g., Scale 1-10; mild, moderate, or severe)    - MILD (1-3): doesn't interfere with normal activities     - MODERATE (4-7): interferes with normal activities or awakens from sleep    - SEVERE (8-10): excruciating pain, unable to do any normal activities       7 7. CARDIAC RISK FACTORS: "Do you have any history of heart problems or risk factors for heart disease?" (e.g., prior heart attack, angina; high blood pressure, diabetes, being overweight, high cholesterol, smoking, or strong family history of heart disease)     Family history, arteries semi clogged per pt, hx of a smoking, but quit 7 years ago 26. PULMONARY RISK FACTORS: "Do you have any history of lung disease?"  (e.g., blood clots in lung, asthma, emphysema, birth  control pills)     No 9. CAUSE: "What do you think is causing the chest pain?"     unknown 10. OTHER SYMPTOMS: "Do you have any other symptoms?" (e.g., dizziness, nausea, vomiting, sweating, fever, difficulty breathing, cough)       Shortness of breath 11. PREGNANCY: "Is there any chance you are pregnant?" "When was your last menstrual period?"       n/a  Protocols used: CHEST PAIN-A-AH

## 2018-04-24 NOTE — Telephone Encounter (Signed)
Reviewed with Dr. Rogers Blocker and directions agreed patient needs to go to ED.

## 2018-04-24 NOTE — Telephone Encounter (Signed)
FYI

## 2018-04-24 NOTE — Telephone Encounter (Signed)
See note

## 2018-04-25 ENCOUNTER — Encounter (HOSPITAL_COMMUNITY): Payer: Self-pay | Admitting: Emergency Medicine

## 2018-04-25 ENCOUNTER — Encounter (HOSPITAL_COMMUNITY): Payer: Self-pay

## 2018-04-25 ENCOUNTER — Emergency Department (HOSPITAL_COMMUNITY): Payer: PPO

## 2018-04-25 ENCOUNTER — Observation Stay (HOSPITAL_COMMUNITY)
Admission: EM | Admit: 2018-04-25 | Discharge: 2018-04-26 | Disposition: A | Discharge status: Home / Self Care | Payer: PPO | Attending: Internal Medicine | Admitting: Internal Medicine

## 2018-04-25 ENCOUNTER — Ambulatory Visit (INDEPENDENT_AMBULATORY_CARE_PROVIDER_SITE_OTHER)
Admission: EM | Admit: 2018-04-25 | Discharge: 2018-04-25 | Disposition: A | Discharge status: Home / Self Care | Payer: PPO | Source: Home / Self Care | Attending: Emergency Medicine | Admitting: Emergency Medicine

## 2018-04-25 ENCOUNTER — Other Ambulatory Visit: Payer: Self-pay

## 2018-04-25 DIAGNOSIS — R079 Chest pain, unspecified: Secondary | ICD-10-CM | POA: Diagnosis not present

## 2018-04-25 DIAGNOSIS — R0789 Other chest pain: Principal | ICD-10-CM | POA: Insufficient documentation

## 2018-04-25 DIAGNOSIS — R0602 Shortness of breath: Secondary | ICD-10-CM

## 2018-04-25 DIAGNOSIS — I10 Essential (primary) hypertension: Secondary | ICD-10-CM | POA: Insufficient documentation

## 2018-04-25 DIAGNOSIS — Z79899 Other long term (current) drug therapy: Secondary | ICD-10-CM | POA: Insufficient documentation

## 2018-04-25 DIAGNOSIS — Z8249 Family history of ischemic heart disease and other diseases of the circulatory system: Secondary | ICD-10-CM | POA: Diagnosis not present

## 2018-04-25 DIAGNOSIS — E785 Hyperlipidemia, unspecified: Secondary | ICD-10-CM | POA: Diagnosis not present

## 2018-04-25 DIAGNOSIS — Z7902 Long term (current) use of antithrombotics/antiplatelets: Secondary | ICD-10-CM | POA: Diagnosis not present

## 2018-04-25 DIAGNOSIS — F329 Major depressive disorder, single episode, unspecified: Secondary | ICD-10-CM | POA: Diagnosis not present

## 2018-04-25 DIAGNOSIS — I2510 Atherosclerotic heart disease of native coronary artery without angina pectoris: Secondary | ICD-10-CM | POA: Diagnosis present

## 2018-04-25 DIAGNOSIS — Z7982 Long term (current) use of aspirin: Secondary | ICD-10-CM | POA: Diagnosis not present

## 2018-04-25 LAB — BASIC METABOLIC PANEL
Anion gap: 12 (ref 5–15)
BUN: 26 mg/dL — ABNORMAL HIGH (ref 8–23)
CALCIUM: 9.6 mg/dL (ref 8.9–10.3)
CO2: 23 mmol/L (ref 22–32)
Chloride: 104 mmol/L (ref 98–111)
Creatinine, Ser: 0.74 mg/dL (ref 0.61–1.24)
Glucose, Bld: 95 mg/dL (ref 70–99)
POTASSIUM: 3.8 mmol/L (ref 3.5–5.1)
Sodium: 139 mmol/L (ref 135–145)

## 2018-04-25 LAB — CBC
HCT: 45.3 % (ref 39.0–52.0)
HEMOGLOBIN: 14.8 g/dL (ref 13.0–17.0)
MCH: 29.4 pg (ref 26.0–34.0)
MCHC: 32.7 g/dL (ref 30.0–36.0)
MCV: 89.9 fL (ref 78.0–100.0)
Platelets: 255 10*3/uL (ref 150–400)
RBC: 5.04 MIL/uL (ref 4.22–5.81)
RDW: 12.3 % (ref 11.5–15.5)
WBC: 9.1 10*3/uL (ref 4.0–10.5)

## 2018-04-25 LAB — I-STAT TROPONIN, ED: Troponin i, poc: 0 ng/mL (ref 0.00–0.08)

## 2018-04-25 NOTE — Discharge Instructions (Signed)
Given your history of heart disease, please go to the emergency department for further evaluation needed.

## 2018-04-25 NOTE — ED Triage Notes (Signed)
Pt reports SHOB and CP when breathing starting a few days ago. Pt reports he had some pain to the back of his L arm. Pt reports dry non-roductive cough. Pt reports worsening SHOB on exertion. Pt reports nausea or vomiting

## 2018-04-25 NOTE — ED Notes (Signed)
Pt advised by myself and Amy PA to go to the ER for further evaluation.  Pt states he will try to go tomorrow. Encouraged patient to go today and as soon as possible.  Pt still states he will go tomorrow.

## 2018-04-25 NOTE — ED Triage Notes (Signed)
Pt presents with complaints of chest pain, shortness of breath since yesterday. History of htn and family history of heart disease. EKG completed. Advised patient to go the ER. Pt refused. Amy PA at bedside.

## 2018-04-26 ENCOUNTER — Observation Stay (HOSPITAL_BASED_OUTPATIENT_CLINIC_OR_DEPARTMENT_OTHER): Payer: PPO

## 2018-04-26 ENCOUNTER — Other Ambulatory Visit: Payer: Self-pay | Admitting: Physician Assistant

## 2018-04-26 ENCOUNTER — Encounter (HOSPITAL_COMMUNITY): Payer: Self-pay | Admitting: Family Medicine

## 2018-04-26 DIAGNOSIS — R079 Chest pain, unspecified: Secondary | ICD-10-CM

## 2018-04-26 DIAGNOSIS — R0602 Shortness of breath: Secondary | ICD-10-CM | POA: Diagnosis not present

## 2018-04-26 DIAGNOSIS — I2510 Atherosclerotic heart disease of native coronary artery without angina pectoris: Secondary | ICD-10-CM

## 2018-04-26 DIAGNOSIS — I10 Essential (primary) hypertension: Secondary | ICD-10-CM

## 2018-04-26 DIAGNOSIS — I25119 Atherosclerotic heart disease of native coronary artery with unspecified angina pectoris: Secondary | ICD-10-CM

## 2018-04-26 LAB — I-STAT TROPONIN, ED: TROPONIN I, POC: 0 ng/mL (ref 0.00–0.08)

## 2018-04-26 LAB — MRSA PCR SCREENING: MRSA by PCR: NEGATIVE

## 2018-04-26 LAB — ECHOCARDIOGRAM COMPLETE
HEIGHTINCHES: 72 in
Weight: 3990.4 oz

## 2018-04-26 LAB — HIV ANTIBODY (ROUTINE TESTING W REFLEX): HIV SCREEN 4TH GENERATION: NONREACTIVE

## 2018-04-26 MED ORDER — VITAMIN D (ERGOCALCIFEROL) 1.25 MG (50000 UNIT) PO CAPS
50000.0000 [IU] | ORAL_CAPSULE | ORAL | Status: DC
  Filled 2018-04-26: qty 1

## 2018-04-26 MED ORDER — ACETAMINOPHEN 325 MG PO TABS: 650.0000 mg | ORAL_TABLET | ORAL | Status: DC | PRN

## 2018-04-26 MED ORDER — ENOXAPARIN SODIUM 40 MG/0.4ML ~~LOC~~ SOLN: 40.0000 mg | Freq: Every day | SUBCUTANEOUS | Status: DC

## 2018-04-26 MED ORDER — VITAMIN D 1000 UNITS PO TABS
1000.0000 [IU] | ORAL_TABLET | Freq: Every day | ORAL | Status: DC
  Administered 2018-04-26: 1000 [IU] via ORAL
  Filled 2018-04-26: qty 1

## 2018-04-26 MED ORDER — MORPHINE SULFATE (PF) 4 MG/ML IV SOLN: 4.0000 mg | INTRAVENOUS | Status: DC | PRN

## 2018-04-26 MED ORDER — ASPIRIN 81 MG PO CHEW
324.0000 mg | CHEWABLE_TABLET | Freq: Once | ORAL | Status: AC
  Administered 2018-04-26: 324 mg via ORAL
  Filled 2018-04-26: qty 4

## 2018-04-26 MED ORDER — FAMOTIDINE 20 MG PO TABS: 20.0000 mg | ORAL_TABLET | Freq: Every day | ORAL | Status: DC

## 2018-04-26 MED ORDER — VITAMIN D (ERGOCALCIFEROL) 1.25 MG (50000 UNIT) PO CAPS: 50000.0000 [IU] | ORAL_CAPSULE | ORAL | 0 refills | Status: DC

## 2018-04-26 MED ORDER — ONDANSETRON HCL 4 MG/2ML IJ SOLN: 4.0000 mg | Freq: Four times a day (QID) | INTRAMUSCULAR | Status: DC | PRN

## 2018-04-26 MED ORDER — MORPHINE SULFATE (PF) 4 MG/ML IV SOLN: 4.0000 mg | Freq: Once | INTRAVENOUS | Status: DC

## 2018-04-26 MED ORDER — SIMVASTATIN 20 MG PO TABS: 20.0000 mg | ORAL_TABLET | Freq: Every day | ORAL | Status: DC

## 2018-04-26 MED ORDER — HYDROCODONE-ACETAMINOPHEN 5-325 MG PO TABS: 1.0000 | ORAL_TABLET | ORAL | Status: DC | PRN

## 2018-04-26 MED ORDER — INFLUENZA VAC SPLIT QUAD 0.5 ML IM SUSY: 0.5000 mL | PREFILLED_SYRINGE | INTRAMUSCULAR | Status: DC

## 2018-04-26 MED ORDER — NITROGLYCERIN 0.4 MG SL SUBL: 0.4000 mg | SUBLINGUAL_TABLET | SUBLINGUAL | Status: DC | PRN

## 2018-04-26 MED ORDER — IRBESARTAN 300 MG PO TABS
300.0000 mg | ORAL_TABLET | Freq: Every day | ORAL | Status: DC
  Administered 2018-04-26: 300 mg via ORAL
  Filled 2018-04-26: qty 2
  Filled 2018-04-26: qty 1

## 2018-04-26 MED ORDER — CLOPIDOGREL BISULFATE 75 MG PO TABS
75.0000 mg | ORAL_TABLET | Freq: Every day | ORAL | Status: DC
  Administered 2018-04-26: 75 mg via ORAL
  Filled 2018-04-26: qty 1

## 2018-04-26 NOTE — ED Notes (Signed)
The pt has had chest pain and sob for 4 days.  Worse today  Only chest tightness at present  He drove himself here tonight.  He has been takiong weight loss drugs  His doctor prescribed

## 2018-04-26 NOTE — H&P (Signed)
History and Physical    Todd Weeks DOB: March 12, 1956 DOA: 04/25/2018  PCP: Briscoe Deutscher, DO   Patient coming from: Home   Chief Complaint: Chest pain, DOE   HPI: Todd Weeks is a 62 y.o. male with medical history significant for hypertension, obesity, peripheral arterial disease, and mild nonobstructive coronary artery disease on remote cardiac cath, now presenting to the emergency department for evaluation of chest discomfort and shortness of breath.  Patient reports he been in his usual state of health until approximately 3 days ago when he developed discomfort in the left chest.  He reports that the sensation in his localized to the left chest, worse with breathing, better with rest, and worsens throughout the day while at work.  He reports chronic dyspnea that he attributes to his smoking history, but denies any recent cough, fevers, or chills.  He denies any swelling or tenderness in the lower extremities and denies any personal or family history of DVT or PE.  Denies cough, hemoptysis, recent surgery, or immobilization.  He uses Mobic for aches and pains and has been taking phentermine for weight loss.  ED Course: Upon arrival to the ED, patient is found to be afebrile, saturating well on room air, and with vitals otherwise stable.  EKG features normal sinus rhythm and chest x-ray is notable for mild bronchitic changes without focal consolidation.  Chemistry panel and CBC are unremarkable and troponin is negative.  Patient was given 324 mg of aspirin and morphine in the ED. He denies any pain at this time.  Plan to observe on the telemetry unit for further evaluation and management of chest discomfort with known CAD.   Review of Systems:  All other systems reviewed and apart from HPI, are negative.  Past Medical History:  Diagnosis Date  . Bell's palsy   . Closed nondisplaced fracture of neck of third metacarpal bone of right hand 02/04/2017  . Depression   .  Diverticulitis   . Headache(784.0)   . HTN (hypertension)   . PVD (peripheral vascular disease) ([place])   . Sexual dysfunction     Past Surgical History:  Procedure Laterality Date  . FEMORAL BYPASS       reports that he quit smoking about 6 years ago. His smoking use included cigarettes. He has a 69.00 pack-year smoking history. He has never used smokeless tobacco. He reports that he drinks about 1.0 standard drinks of alcohol per week. He reports that he has current or past drug history. Drugs: Cocaine, Marijuana, and "Crack" cocaine.  No Known Allergies  Family History  Problem Relation Age of Onset  . Hypertension Mother   . Deep vein thrombosis Father   . Diabetes Brother   . Tuberculosis Maternal Grandfather   . Emphysema Maternal Grandfather   . Lung disease Brother   . Asthma Son      Prior to Admission medications   Medication Sig Start Date End Date Taking? Authorizing Provider  cholecalciferol (VITAMIN D) 1000 units tablet Take 1,000 Units by mouth daily.     [provider]  clopidogrel (PLAVIX) 75 MG tablet TAKE (1) TABLET BY MOUTH ONCE DAILY. 09/11/17   Briscoe Deutscher, DO  famotidine (PEPCID) 20 MG tablet TAKE (1) TABLET BY MOUTH AT BEDTIME. 01/27/18   Briscoe Deutscher, DO  irbesartan (AVAPRO) 150 MG tablet TAKE 2 TABLETS BY MOUTH DAILY. 12/09/17   Briscoe Deutscher, DO  meloxicam (MOBIC) 15 MG tablet Take 1 tablet (15 mg total) by mouth daily.  03/31/18   Briscoe Deutscher, DO  phentermine (ADIPEX-P) 37.5 MG tablet Take 1 tablet (37.5 mg total) by mouth daily before breakfast. 03/31/18   Briscoe Deutscher, DO  simvastatin (ZOCOR) 20 MG tablet TAKE 1 TABLET BY MOUTH DAILY AT 6 PM. 01/27/18   Briscoe Deutscher, DO    Physical Exam: Vitals:   04/25/18 2240 04/26/18 0110 04/26/18 0200 04/26/18 0230  BP:  139/84 138/78 (!) 152/82  Pulse:  64 (!) 57 60  Resp:  16 12 (!) 9  Temp:      TempSrc:      SpO2:  96% 99% 94%  Weight: 112.9 kg     Height: 6' (1.829 m)        Constitutional: NAD, calm  Eyes: PERTLA, lids and conjunctivae normal ENMT: Mucous membranes are moist. Posterior pharynx clear of any exudate or lesions.   Neck: normal, supple, no masses, no thyromegaly Respiratory: clear to auscultation bilaterally, no wheezing, no crackles. Normal respiratory effort.    Cardiovascular: S1 & S2 heard, regular rate and rhythm. No extremity edema.   Abdomen: No distension, no tenderness, soft. Bowel sounds normal.  Musculoskeletal: no clubbing / cyanosis. No joint deformity upper and lower extremities.    Skin: no significant rashes, lesions, ulcers. Warm, dry, well-perfused. Neurologic: CN 2-12 grossly intact. Sensation intact. Strength 5/5 in all 4 limbs.  Psychiatric: Alert and oriented x 3. Calm, cooperative.    Labs on Admission: I have personally reviewed following labs and imaging studies  CBC: Recent Labs  Lab 04/25/18 2243  WBC 9.1  HGB 14.8  HCT 45.3  MCV 89.9  PLT 720   Basic Metabolic Panel: Recent Labs  Lab 04/25/18 2243  NA 139  K 3.8  CL 104  CO2 23  GLUCOSE 95  BUN 26*  CREATININE 0.74  CALCIUM 9.6   GFR: Estimated Creatinine Clearance: 122.6 mL/min (by C-G formula based on SCr of 0.74 mg/dL). Liver Function Tests: No results for input(s): AST, ALT, ALKPHOS, BILITOT, PROT, ALBUMIN in the last 168 hours. No results for input(s): LIPASE, AMYLASE in the last 168 hours. No results for input(s): AMMONIA in the last 168 hours. Coagulation Profile: No results for input(s): INR, PROTIME in the last 168 hours. Cardiac Enzymes: No results for input(s): CKTOTAL, CKMB, CKMBINDEX, TROPONINI in the last 168 hours. BNP (last 3 results) No results for input(s): PROBNP in the last 8760 hours. HbA1C: No results for input(s): HGBA1C in the last 72 hours. CBG: No results for input(s): GLUCAP in the last 168 hours. Lipid Profile: No results for input(s): CHOL, HDL, LDLCALC, TRIG, CHOLHDL, LDLDIRECT in the last 72  hours. Thyroid Function Tests: No results for input(s): TSH, T4TOTAL, FREET4, T3FREE, THYROIDAB in the last 72 hours. Anemia Panel: No results for input(s): VITAMINB12, FOLATE, FERRITIN, TIBC, IRON, RETICCTPCT in the last 72 hours. Urine analysis:    Component Value Date/Time   COLORURINE lt. yellow 11/09/2010 1556   APPEARANCEUR Clear 11/09/2010 1556   LABSPEC >=1.030 11/09/2010 1556   PHURINE 5.0 11/09/2010 1556   GLUCOSEU NEGATIVE 01/29/2010 1032   HGBUR trace-intact 11/09/2010 1556   BILIRUBINUR neg 10/19/2014 0918   KETONESUR NEGATIVE 01/29/2010 1032   PROTEINUR 30 10/19/2014 0918   PROTEINUR NEGATIVE 01/29/2010 1032   UROBILINOGEN 0.2 10/19/2014 0918   UROBILINOGEN 0.2 11/09/2010 1556   NITRITE neg 10/19/2014 0918   NITRITE negative 11/09/2010 1556   LEUKOCYTESUR Negative 10/19/2014 0918   Sepsis Labs: @LABRCNTIP (procalcitonin:4,lacticidven:4) )No results found for this or any previous visit (from  the past 240 hour(s)).   Radiological Exams on Admission: Dg Chest 2 View  Result Date: 04/25/2018 CLINICAL DATA:  Shortness of breath and chest pain for a few days. EXAM: CHEST - 2 VIEW COMPARISON:  Chest radiograph August 05, 2007 FINDINGS: Cardiomediastinal silhouette is normal. No pleural effusions or focal consolidations. Mild bronchitic changes. Trachea projects midline and there is no pneumothorax. Soft tissue planes and included osseous structures are non-suspicious. IMPRESSION: Mild bronchitic changes without focal consolidation. Electronically Signed   By: Elon Alas M.D.   On: 04/25/2018 23:21    EKG: Independently reviewed. Normal sinus rhythm.   Assessment/Plan   1. Chest pain; CAD  - Presents with 3 days of left-sided chest discomfort, worse with exertion, and with pleuritic component but no findings to suggest DVT or PE  - He had mild CAD in RCA on cath in 2012, has PAD, and HTN  - EKG without acute ischemic features, troponin 0.00, and CXR with mild  bronchitic changes only  - He was treated with ASA 324 mg and IV morphine in ED, denies pain at time of admission  - Continue cardiac monitoring, obtain serial troponin measurements, repeat EKG, continue Plavix, statin, and ARB, hold Mobic and phentermine, use NTG for recurrent pain, morphine prn pain not relived by NTG    2. Hypertension  - BP at goal, continue ARB     DVT prophylaxis: Lovenox Code Status: Full  Family Communication: Discussed with patient  Consults called: None Admission status: Observation     Vianne Bulls, MD [facility] Pager [phone]  If 7PM-7AM, please contact night-coverage [email or web address] Password [facility]  04/26/2018, 3:32 AM

## 2018-04-26 NOTE — Discharge Summary (Signed)
Physician Discharge Summary  Todd Weeks PRF:[id number] DOB: 1955-11-17 DOA: 04/25/2018  PCP: Briscoe Deutscher, DO  Admit date: 04/25/2018 Discharge date: 04/26/2018  Admitted From: Home Disposition:  Home  Recommendations for Outpatient Follow-up:  1. Follow up with PCP in 1-2 weeks 2. Please obtain BMP/CBC in one week 3. Needs stress test out patient   Home Health: none  Discharge Condition: stable.  CODE STATUS: full code.  Diet recommendation: Heart Healthy   Brief/Interim Summary: Todd Weeks is a 62 y.o. male with medical history significant for hypertension, obesity, peripheral arterial disease, and mild nonobstructive coronary artery disease on remote cardiac cath, now presenting to the emergency department for evaluation of chest discomfort and shortness of breath.  Patient reports he been in his usual state of health until approximately 3 days ago when he developed discomfort in the left chest.  He reports that the sensation in his localized to the left chest, worse with breathing, better with rest, and worsens throughout the day while at work.  He reports chronic dyspnea that he attributes to his smoking history, but denies any recent cough, fevers, or chills.  He denies any swelling or tenderness in the lower extremities and denies any personal or family history of DVT or PE.  Denies cough, hemoptysis, recent surgery, or immobilization.  He uses Mobic for aches and pains and has been taking phentermine for weight loss.  ED Course: Upon arrival to the ED, patient is found to be afebrile, saturating well on room air, and with vitals otherwise stable.  EKG features normal sinus rhythm and chest x-ray is notable for mild bronchitic changes without focal consolidation.  Chemistry panel and CBC are unremarkable and troponin is negative.  Patient was given 324 mg of aspirin and morphine in the ED. He denies any pain at this time.  Plan to observe on the telemetry unit for further evaluation  and management of chest discomfort with known CAD.   1-Chest pain;  Troponin negative. EKG no significant changes.  Stop Phentermine.  ECHO normal EF.  Appreciate cardiology help.  Plan for stress test out pateient.   HTN; continue with BP medication.   Discharge Diagnoses:  Principal Problem:   Chest pain Active Problems:   HTN (hypertension)   CAD (coronary artery disease)    Discharge Instructions  Discharge Instructions    Diet - low sodium heart healthy   Complete by:  As directed    Increase activity slowly   Complete by:  As directed      Allergies as of 04/26/2018   No Known Allergies     Medication List    STOP taking these medications   meloxicam 15 MG tablet Commonly known as:  MOBIC   phentermine 37.5 MG tablet Commonly known as:  ADIPEX-P     TAKE these medications   clopidogrel 75 MG tablet Commonly known as:  PLAVIX TAKE (1) TABLET BY MOUTH ONCE DAILY. What changed:  See the new instructions.   famotidine 20 MG tablet Commonly known as:  PEPCID TAKE (1) TABLET BY MOUTH AT BEDTIME. What changed:  See the new instructions.   irbesartan 150 MG tablet Commonly known as:  AVAPRO TAKE 2 TABLETS BY MOUTH DAILY. What changed:  when to take this   simvastatin 20 MG tablet Commonly known as:  ZOCOR TAKE 1 TABLET BY MOUTH DAILY AT 6 PM. What changed:  See the new instructions.   Vitamin D (Ergocalciferol) 50000 units Caps capsule Commonly known as:  DRISDOL Take 1 capsule (50,000 Units total) by mouth every 7 (seven) days.      Follow-up Information    Briscoe Deutscher, DO Follow up in 1 week(s).   Specialty:  Family Medicine Contact information: [place] [address] [phone]        Burnell Blanks, MD Follow up.   Specialty:  Cardiology Why:  office will call with stress test and office visit time Contact information: [place]. [address] [place] [address] [phone]           No Known Allergies  Consultations: Cardiology   Procedures/Studies: Dg Chest 2 View  Result Date: 04/25/2018 CLINICAL DATA:  Shortness of breath and chest pain for a few days. EXAM: CHEST - 2 VIEW COMPARISON:  Chest radiograph August 05, 2007 FINDINGS: Cardiomediastinal silhouette is normal. No pleural effusions or focal consolidations. Mild bronchitic changes. Trachea projects midline and there is no pneumothorax. Soft tissue planes and included osseous structures are non-suspicious. IMPRESSION: Mild bronchitic changes without focal consolidation. Electronically Signed   By: Elon Alas M.D.   On: 04/25/2018 23:21      Subjective: Chest pressure free  Discharge Exam: Vitals:   04/26/18 0735 04/26/18 1130  BP: 133/66 124/78  Pulse: (!) 59 71  Resp: 18 17  Temp: 98.7 F (37.1 C) 97.7 F (36.5 C)  SpO2: 100% 98%   Vitals:   04/26/18 0400 04/26/18 0440 04/26/18 0735 04/26/18 1130  BP: 132/76 (!) 152/80 133/66 124/78  Pulse: 61 63 (!) 59 71  Resp: 15 19 18 17   Temp:  97.8 F (36.6 C) 98.7 F (37.1 C) 97.7 F (36.5 C)  TempSrc:  Oral Oral Oral  SpO2: 98% 100% 100% 98%  Weight:  113.1 kg    Height:  6' (1.829 m)      General: Pt is alert, awake, not in acute distress Cardiovascular: RRR, S1/S2 +, no rubs, no gallops Respiratory: CTA bilaterally, no wheezing, no rhonchi Abdominal: Soft, NT, ND, bowel sounds + Extremities: no edema, no cyanosis    The results of significant diagnostics from this hospitalization (including imaging, microbiology, ancillary and laboratory) are listed below for reference.     Microbiology: Recent Results (from the past 240 hour(s))  MRSA PCR Screening     Status: None   Collection Time: 04/26/18  4:56 AM  Result Value Ref Range Status   MRSA by PCR NEGATIVE NEGATIVE Final    Comment:        The GeneXpert MRSA Assay (FDA approved for NASAL specimens only), is one component of a comprehensive MRSA  colonization surveillance program. It is not intended to diagnose MRSA infection nor to guide or monitor treatment for MRSA infections. Performed at [facility], [place] [address] [place], [place] [address]      Labs: BNP (last 3 results) No results for input(s): BNP in the last 8760 hours. Basic Metabolic Panel: Recent Labs  Lab 04/25/18 2243  NA 139  K 3.8  CL 104  CO2 23  GLUCOSE 95  BUN 26*  CREATININE 0.74  CALCIUM 9.6   Liver Function Tests: No results for input(s): AST, ALT, ALKPHOS, BILITOT, PROT, ALBUMIN in the last 168 hours. No results for input(s): LIPASE, AMYLASE in the last 168 hours. No results for input(s): AMMONIA in the last 168 hours. CBC: Recent Labs  Lab 04/25/18 2243  WBC 9.1  HGB 14.8  HCT 45.3  MCV 89.9  PLT 255   Cardiac  Enzymes: No results for input(s): CKTOTAL, CKMB, CKMBINDEX, TROPONINI in the last 168 hours. BNP: Invalid input(s): POCBNP CBG: No results for input(s): GLUCAP in the last 168 hours. D-Dimer No results for input(s): DDIMER in the last 72 hours. Hgb A1c No results for input(s): HGBA1C in the last 72 hours. Lipid Profile No results for input(s): CHOL, HDL, LDLCALC, TRIG, CHOLHDL, LDLDIRECT in the last 72 hours. Thyroid function studies No results for input(s): TSH, T4TOTAL, T3FREE, THYROIDAB in the last 72 hours.  Invalid input(s): FREET3 Anemia work up No results for input(s): VITAMINB12, FOLATE, FERRITIN, TIBC, IRON, RETICCTPCT in the last 72 hours. Urinalysis    Component Value Date/Time   COLORURINE lt. yellow 11/09/2010 1556   APPEARANCEUR Clear 11/09/2010 1556   LABSPEC >=1.030 11/09/2010 1556   PHURINE 5.0 11/09/2010 1556   GLUCOSEU NEGATIVE 01/29/2010 1032   HGBUR trace-intact 11/09/2010 1556   BILIRUBINUR neg 10/19/2014 0918   KETONESUR NEGATIVE 01/29/2010 1032   PROTEINUR 30 10/19/2014 0918   PROTEINUR NEGATIVE 01/29/2010 1032   UROBILINOGEN 0.2 10/19/2014 0918   UROBILINOGEN 0.2 11/09/2010  1556   NITRITE neg 10/19/2014 0918   NITRITE negative 11/09/2010 1556   LEUKOCYTESUR Negative 10/19/2014 0918   Sepsis Labs Invalid input(s): PROCALCITONIN,  WBC,  LACTICIDVEN Microbiology Recent Results (from the past 240 hour(s))  MRSA PCR Screening     Status: None   Collection Time: 04/26/18  4:56 AM  Result Value Ref Range Status   MRSA by PCR NEGATIVE NEGATIVE Final    Comment:        The GeneXpert MRSA Assay (FDA approved for NASAL specimens only), is one component of a comprehensive MRSA colonization surveillance program. It is not intended to diagnose MRSA infection nor to guide or monitor treatment for MRSA infections. Performed at [facility], [place] [address] [place],  [address]      Time coordinating discharge:  35 minutes.   SIGNED:   Elmarie Shiley, MD  [facility] 04/26/2018, 3:05 PM Pager   If 7PM-7AM, please contact night-coverage [email or web address] Password [email or web address]

## 2018-04-26 NOTE — ED Notes (Signed)
Report given to renee on 2c

## 2018-04-26 NOTE — ED Provider Notes (Signed)
[facility] EMERGENCY DEPARTMENT Provider Note   CSN: [id number] Arrival date & time: 04/25/18  2228     History   Chief Complaint Chief Complaint  Patient presents with  . Chest Pain  . Shortness of Breath    HPI Todd Weeks is a 62 y.o. male.  Patient with past medical history remarkable for hypertension, hyperlipidemia, coronary artery disease, presents to the emergency department with a chief complaint of chest pressure.  He reports having intermittent episodes of chest pressure for the past couple of days.  He reports some associated shortness of breath.  He denies any fever, chills, cough.  Denies any diaphoresis or radiating pain.  He states that he can still feel the sensation, which she describes as a heaviness.  He states that his brother recently nearly died from massive MI.  His last cardiac catheterization was approximately 5 years ago which reportedly showed 20-30% stenosis.  The history is provided by the patient. No language interpreter was used.    Past Medical History:  Diagnosis Date  . Bell's palsy   . Closed nondisplaced fracture of neck of third metacarpal bone of right hand 02/04/2017  . Depression   . Diverticulitis   . Headache(784.0)   . HTN (hypertension)   . PVD (peripheral vascular disease) ([place])   . Sexual dysfunction     Patient Active Problem List   Diagnosis Date Noted  . Insulin resistance 09/03/2017  . Numbness of right anterior thigh 01/21/2017  . Lesion or mass of paranasal sinuses 01/18/2016  . Severe obesity (BMI >= 40) ([place]) 10/29/2015  . Subacromial bursitis 06/01/2015  . Hyperlipidemia 05/04/2015  . Atherosclerosis of native arteries of extremity with intermittent claudication ([place]) 05/22/2012  . CAD (coronary artery disease) 02/02/2011  . Abnormal cardiovascular function study 01/04/2011  . Elevated PSA 08/06/2007  . Former smoker 08/01/2007  . HTN (hypertension) 08/01/2007    Past Surgical History:    Procedure Laterality Date  . FEMORAL BYPASS          Home Medications    Prior to Admission medications   Medication Sig Start Date End Date Taking? Authorizing Provider  cholecalciferol (VITAMIN D) 1000 units tablet Take 1,000 Units by mouth daily.     [provider]  clopidogrel (PLAVIX) 75 MG tablet TAKE (1) TABLET BY MOUTH ONCE DAILY. 09/11/17   Briscoe Deutscher, DO  famotidine (PEPCID) 20 MG tablet TAKE (1) TABLET BY MOUTH AT BEDTIME. 01/27/18   Briscoe Deutscher, DO  irbesartan (AVAPRO) 150 MG tablet TAKE 2 TABLETS BY MOUTH DAILY. 12/09/17   Briscoe Deutscher, DO  meloxicam (MOBIC) 15 MG tablet Take 1 tablet (15 mg total) by mouth daily. 03/31/18   Briscoe Deutscher, DO  phentermine (ADIPEX-P) 37.5 MG tablet Take 1 tablet (37.5 mg total) by mouth daily before breakfast. 03/31/18   Briscoe Deutscher, DO  simvastatin (ZOCOR) 20 MG tablet TAKE 1 TABLET BY MOUTH DAILY AT 6 PM. 01/27/18   Briscoe Deutscher, DO    Family History Family History  Problem Relation Age of Onset  . Hypertension Mother   . Deep vein thrombosis Father   . Diabetes Brother   . Tuberculosis Maternal Grandfather   . Emphysema Maternal Grandfather   . Lung disease Brother   . Asthma Son     Social History Social History   Tobacco Use  . Smoking status: Former Smoker    Packs/day: 1.50    Years: 46.00    Pack years: 69.00  Types: Cigarettes    Last attempt to quit: 12/12/2011    Years since quitting: 6.3  . Smokeless tobacco: Never Used  Substance Use Topics  . Alcohol use: Yes    Alcohol/week: 1.0 standard drinks    Types: 1 Cans of beer per week    Comment: a couple of beer on a weekend  . Drug use: Yes    Types: Cocaine, Marijuana, "Crack" cocaine    Comment: not since 1999     Allergies   Patient has no known allergies.   Review of Systems Review of Systems  All other systems reviewed and are negative.    Physical Exam Updated Vital Signs BP (!) 152/82   Pulse 60   Temp 97.9 F (36.6  C) (Oral)   Resp (!) 9   Ht 6' (1.829 m)   Wt 112.9 kg   SpO2 94%   BMI 33.77 kg/m   Physical Exam  Constitutional: He is oriented to person, place, and time. He appears well-developed and well-nourished.  HENT:  Head: Normocephalic and atraumatic.  Eyes: Pupils are equal, round, and reactive to light. Conjunctivae and EOM are normal. Right eye exhibits no discharge. Left eye exhibits no discharge. No scleral icterus.  Neck: Normal range of motion. Neck supple. No JVD present.  Cardiovascular: Normal rate, regular rhythm and normal heart sounds. Exam reveals no gallop and no friction rub.  No murmur heard. Pulmonary/Chest: Effort normal and breath sounds normal. No respiratory distress. He has no wheezes. He has no rales. He exhibits no tenderness.  Abdominal: Soft. He exhibits no distension and no mass. There is no tenderness. There is no rebound and no guarding.  Musculoskeletal: Normal range of motion. He exhibits no edema or tenderness.  Neurological: He is alert and oriented to person, place, and time.  Skin: Skin is warm and dry.  Psychiatric: He has a normal mood and affect. His behavior is normal. Judgment and thought content normal.  Nursing note and vitals reviewed.    ED Treatments / Results  Labs (all labs ordered are listed, but only abnormal results are displayed) Labs Reviewed  BASIC METABOLIC PANEL - Abnormal; Notable for the following components:      Result Value   BUN 26 (*)    All other components within normal limits  CBC  I-STAT TROPONIN, ED    EKG EKG Interpretation  Date/Time:  Saturday April 26 2018 02:54:02 EDT Ventricular Rate:  59 PR Interval:    QRS Duration: 94 QT Interval:  433 QTC Calculation: 429 R Axis:   44 Text Interpretation:  Sinus rhythm Borderline prolonged PR interval Low voltage, precordial leads Confirmed by Orpah Greek [phone]) on 04/26/2018 2:56:31 AM   Radiology Dg Chest 2 View  Result Date:  04/25/2018 CLINICAL DATA:  Shortness of breath and chest pain for a few days. EXAM: CHEST - 2 VIEW COMPARISON:  Chest radiograph August 05, 2007 FINDINGS: Cardiomediastinal silhouette is normal. No pleural effusions or focal consolidations. Mild bronchitic changes. Trachea projects midline and there is no pneumothorax. Soft tissue planes and included osseous structures are non-suspicious. IMPRESSION: Mild bronchitic changes without focal consolidation. Electronically Signed   By: Elon Alas M.D.   On: 04/25/2018 23:21    Procedures Procedures (including critical care time)  Medications Ordered in ED Medications - No data to display   Initial Impression / Assessment and Plan / ED Course  I have reviewed the triage vital signs and the nursing notes.  Pertinent labs &  imaging results that were available during my care of the patient were reviewed by me and considered in my medical decision making (see chart for details).     Patient presents with chest pain intermittently x3 days.  Still having symptoms now.  Given aspirin in the ED.Marland Kitchen  DDx includes ACS, PE, pneumothorax, aortic dissection, esophageal rupture, pericarditis, chest wall pain.  Patient describes the sensation as a heaviness.  He does report associated shortness of breath.  He denies any cough or fever.  Doubt PE, he is not tachycardic or hypoxic.  He does have known coronary artery disease.  Age and risk factors give him a heart score of 4.  Given that his last heart catheterization was 5 years ago, and he still has chest pain now, will consult hospitalist for admission for chest pain rule out.    Final Clinical Impressions(s) / ED Diagnoses   Final diagnoses:  Chest pain, unspecified type  Shortness of breath    ED Discharge Orders    None       Montine Circle, PA-C 04/26/18 0551    Orpah Greek, MD 04/26/18 0700

## 2018-04-26 NOTE — Consult Note (Addendum)
Cardiology Consultation:   Patient ID: JAKAI RISSE MRN: [id number]; DOB: 06/04/1956  Admit date: 04/25/2018 Date of Consult: 04/26/2018  Primary Care Provider: Briscoe Deutscher, DO Primary Cardiologist: Dr. Angelena Form (last seen 2012)   Patient Profile:   Todd Weeks is a 62 y.o. male with a hx of peripheral vascular disease status post angioplasty and stenting to right femoral artery, hypertension, obesity and mild not of active CAD by cath 2012 who is being seen today for the evaluation of chest pain at the request of Dr. Tyrell Antonio.   History of Present Illness:   Mr. Fletchall was in usual state of health up until 4 days ago when he had intermittent left-sided chest pain.  Worse with breathing.  He is more worried about breathing rather than chest pain.  No radiation.  Denies nausea or vomiting.  He works as a Engineer, manufacturing systems.  He had a constant worsening chest discomfort like pressure at work 9/12.  Yesterday he had a normal morning but pain reoccurred while at work leading to further evaluation.  He was admitted for rule out.  Point-of-care troponin negative.  EKG shows normal sinus rhythm without acute ST/T wave changes-personally reviewed.  Electrolyte and serum creatinine normal.  Chest x-ray shows mild bronchitis changes.  Patient has a history of tobacco smoking, quit 7 years ago. Lifts weight 3 times per week without chest pain or shortness of breath.  Denies orthopnea, PND, syncope, lower extremity edema or melena.  Compliant with medication.  He lost 39 pounds in past 2 months on phentermine.  Past Medical History:  Diagnosis Date  . Bell's palsy   . Closed nondisplaced fracture of neck of third metacarpal bone of right hand 02/04/2017  . Depression   . Diverticulitis   . Headache(784.0)   . HTN (hypertension)   . PVD (peripheral vascular disease) ([place])   . Sexual dysfunction     Past Surgical History:  Procedure Laterality Date  . FEMORAL BYPASS      Inpatient  Medications: Scheduled Meds: . cholecalciferol  1,000 Units Oral Daily  . clopidogrel  75 mg Oral Daily  . enoxaparin (LOVENOX) injection  40 mg Subcutaneous Daily  . famotidine  20 mg Oral QHS  . [START ON 04/27/2018] Influenza vac split quadrivalent PF  0.5 mL Intramuscular Tomorrow-1000  . irbesartan  300 mg Oral Daily  . morphine  4 mg Intravenous Once  . simvastatin  20 mg Oral q1800  . Vitamin D (Ergocalciferol)  50,000 Units Oral Q7 days   Continuous Infusions:  PRN Meds: acetaminophen, HYDROcodone-acetaminophen, morphine injection, nitroGLYCERIN, ondansetron (ZOFRAN) IV  Allergies:   No Known Allergies  Social History:   Social History   Socioeconomic History  . Marital status: Single    Spouse name: Not on file  . Number of children: 2  . Years of education: 81  . Highest education level: Not on file  Occupational History  . Occupation: Electrical engineer  . Occupation: Unemployed    Comment: disability  Social Needs  . Financial resource strain: Not on file  . Food insecurity:    Worry: Not on file    Inability: Not on file  . Transportation needs:    Medical: Not on file    Non-medical: Not on file  Tobacco Use  . Smoking status: Former Smoker    Packs/day: 1.50    Years: 46.00    Pack years: 69.00    Types: Cigarettes    Last attempt to quit: 12/12/2011  Years since quitting: 6.3  . Smokeless tobacco: Never Used  Substance and Sexual Activity  . Alcohol use: Yes    Alcohol/week: 1.0 standard drinks    Types: 1 Cans of beer per week    Comment: a couple of beer on a weekend  . Drug use: Yes    Types: Cocaine, Marijuana, "Crack" cocaine    Comment: not since 1999  . Sexual activity: Not on file  Lifestyle  . Physical activity:    Days per week: Not on file    Minutes per session: Not on file  . Stress: Not on file  Relationships  . Social connections:    Talks on phone: Not on file    Gets together: Not on file    Attends religious  service: Not on file    Active member of club or organization: Not on file    Attends meetings of clubs or organizations: Not on file    Relationship status: Not on file  . Intimate partner violence:    Fear of current or ex partner: Not on file    Emotionally abused: Not on file    Physically abused: Not on file    Forced sexual activity: Not on file  Other Topics Concern  . Not on file  Social History Narrative   Raised in [place], [place]. Does not have any religious beliefs that would effect healthcare. Lives in house with sister. Likes to ride motorcycle for fun.    Moving out on his own; in the next couple of weeks will be moving back home that he had rented      [place]; with a helmet    Family History:   Family History  Problem Relation Age of Onset  . Hypertension Mother   . Deep vein thrombosis Father   . Diabetes Brother   . Tuberculosis Maternal Grandfather   . Emphysema Maternal Grandfather   . Lung disease Brother   . Asthma Son      ROS:  Please see the history of present illness.  All other ROS reviewed and negative.     Physical Exam/Data:   Vitals:   04/26/18 0400 04/26/18 0440 04/26/18 0735 04/26/18 1130  BP: 132/76 (!) 152/80 133/66 124/78  Pulse: 61 63 (!) 59 71  Resp: 15 19 18 17   Temp:  97.8 F (36.6 C) 98.7 F (37.1 C) 97.7 F (36.5 C)  TempSrc:  Oral Oral Oral  SpO2: 98% 100% 100% 98%  Weight:  113.1 kg    Height:  6' (1.829 m)      Intake/Output Summary (Last 24 hours) at 04/26/2018 1150 Last data filed at 04/26/2018 0900 Gross per 24 hour  Intake 1040 ml  Output -  Net 1040 ml   Filed Weights   04/25/18 2240 04/26/18 0440  Weight: 112.9 kg 113.1 kg   Body mass index is 33.82 kg/m.  General: Obese male in no acute distress HEENT: normal Lymph: no adenopathy Neck: no JVD Endocrine:  No thryomegaly Vascular: No carotid bruits; FA pulses 2+ bilaterally without bruits  Cardiac:  normal S1, S2; RRR; no murmur Lungs:  clear to  auscultation bilaterally, no wheezing, rhonchi or rales  Abd: soft, nontender, no hepatomegaly  Ext: no edema Musculoskeletal:  No deformities, BUE and BLE strength normal and equal Skin: warm and dry  Neuro:  CNs 2-12 intact, no focal abnormalities noted Psych:  Normal affect   Telemetry:  Telemetry was personally reviewed and demonstrates: Normal sinus rhythm with  PVC  Relevant CV Studies:  CARDIAC CATHETERIZATION  01/11/11   PROCEDURES PERFORMED: 1. Left heart catheterization 2. Selective coronary angiography. 3. Left ventricular angiogram. 4. Distal aortogram with bilateral lower extremity runoff.  OPERATOR:  [name], MD  INDICATION:  This is a 62 year old Caucasian male with a history of hypertension, hyperlipidemia and tobacco abuse who I saw in the office with complaints of chest pain and leg pain consistent with claudication. He underwent a stress Myoview which showed left ventricular ejection fraction of 49% with septal hypokinesis.  He also had lower extremity Dopplers which showed reduced ankle brachial index bilaterally with suggestion of occluded left superficial femoral artery and moderate stenosis in the right superficial femoral artery.  Diagnostic cardiac catheterization and lower extremity angiogram was arranged for today.  PROCEDURE IN DETAIL:  The patient was brought to the main peripheral vascular laboratory after signing informed consent for the procedure. The right groin was prepped and draped in a sterile fashion.  Lidocaine 1% was used for local anesthesia.  A 5-French sheath was inserted into the right femoral artery without difficulty.  Cardiac catheterization was performed with standard diagnostic catheters.  A pigtail catheter was used to perform a left ventricular angiogram.  Pigtail catheters pulled back across the aortic valve with no significant gradient.  We then pulled the pigtail catheter back to the distal aorta just above  the takeoff of bilateral renal arteries.  Angiography of the abdominal aorta was performed.  The pigtail catheter was then pulled back to the level of the aortic bifurcation and pelvic angiography was performed.  We then used a crossover catheter to engage the left iliac system and through an end-hole catheter, we performed a runoff of the left lower extremity. Runoff of the right lower extremity was performed through the sheath in the right common femoral artery.  The patient tolerated the procedure well.  He was taken to the recovery area in stable condition.  HEMODYNAMIC FINDINGS:  Central aortic pressure 102/64.  Left ventricular pressure 101/1/5.  ANGIOGRAPHIC FINDINGS: 1. The left main coronary artery had no evidence of disease. 2. The left anterior descending was large vessel that coursed the apex     and gave off a small-caliber diagonal branch.  There was no disease     in this system. 3. Circumflex artery was composed of a small AV groove branch.  No     disease.  There was a moderate-sized bifurcating ramus intermediate     branch with no disease. 4. The right coronary artery was a dominant vessel with 30% mid     stenosis.  There were no flow-limiting lesions noted in this     vessel. 5. Left ventricular angiogram was performed in the RAO projection that     showed low normal left ventricular systolic function with ejection     fraction of 50%. 6. Distal aorta with mild plaque.  No aneurysm noted. 7. Bilateral renal arteries were patent without any disease. 8. The left iliac arteries were patent with no disease.  The left     common femoral artery was patent with no disease.  The left     superficial femoral artery had a 100% occlusion in its ostium with     reconstitution of the vessel via collaterals in the distal     superficial femoral artery.  There is three-vessel runoff to left     foot. 9. The right iliac vessels were patent with no disease.  The right  common femoral artery was patent.  There is an 80% proximal right     superficial femoral artery stenosis.  There is diffuse 30% plaque     throughout the mid and distal right superficial femoral artery.     There is three-vessel runoff to the right foot.  IMPRESSION: 1. Mild nonobstructive coronary artery disease. 2. Low normal left ventricular systolic function. 3. 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 4. Severe stenosis in the proximal right superficial femoral artery.  RECOMMENDATIONS:  At this time we will pursue medical management of the patient's coronary artery disease.  I will discuss the possibility of bringing him back at a later date for intervention of the severe stenosis in the right superficial femoral artery.  He will be discharged home today after his bedrest.  Laboratory Data:  Chemistry Recent Labs  Lab 04/25/18 2243  NA 139  K 3.8  CL 104  CO2 23  GLUCOSE 95  BUN 26*  CREATININE 0.74  CALCIUM 9.6  GFRNONAA >60  GFRAA >60  ANIONGAP 12    Hematology Recent Labs  Lab 04/25/18 2243  WBC 9.1  RBC 5.04  HGB 14.8  HCT 45.3  MCV 89.9  MCH 29.4  MCHC 32.7  RDW 12.3  PLT 255    Recent Labs  Lab 04/25/18 2257 04/26/18 0402  TROPIPOC 0.00 0.00    Radiology/Studies:  Dg Chest 2 View  Result Date: 04/25/2018 CLINICAL DATA:  Shortness of breath and chest pain for a few days. EXAM: CHEST - 2 VIEW COMPARISON:  Chest radiograph August 05, 2007 FINDINGS: Cardiomediastinal silhouette is normal. No pleural effusions or focal consolidations. Mild bronchitic changes. Trachea projects midline and there is no pneumothorax. Soft tissue planes and included osseous structures are non-suspicious. IMPRESSION: Mild bronchitic changes without focal consolidation. Electronically Signed   By: Elon Alas M.D.   On: 04/25/2018 23:21    Assessment and Plan:   1. Chest pain Intermittent chest pressure for past few days worsened in  the last 2 days.  Worse with deep breath.  No radiation, nausea or vomiting.  Point-of-care troponin negative x2.  EKG without acute ischemic changes.  Patient had a mild nonobstructive CAD by cath in 2012.  He already ate today. He wants to go to work tomorrow.  Cardiology risk factors include morbid obesity, hypertension and vascular disease.  2.  PVD -No claudication symptoms.  Continue Plavix and statin.  3.  Hypertension -Blood pressure stable on irbesartan.  Dr. Marlou Porch to see later today.   For questions or updates, please contact [facility] Please consult [email or web address] for contact info under     Jarrett Soho, PA  04/26/2018 11:50 AM   Personally seen and examined. Agree with above.  62 year old male with previously diagnosed PVD post angioplasty and stenting to right femoral artery and minimal coronary disease seen on catheterization in 2012 with hypertension, obesity here with chest discomfort.  Was feeling this at work intermittently.  At one point was constant in duration on the 12th.  He felt somewhat short of breath.  At sometimes during the interview stated that he did not actually have much chest discomfort.  Troponins were negative, EKG shows no ischemic changes personally interpreted and reviewed.  He quit smoking several years ago.  Currently on phentermine for weight loss.  He is lost about 40 pounds in 2 months.  GEN: Well nourished, well developed, in no acute distress obese HEENT: normal  Neck: no JVD, carotid bruits,  or masses Cardiac: RRR; no murmurs, rubs, or gallops,no edema  Respiratory:  clear to auscultation bilaterally, normal work of breathing GI: soft, nontender, nondistended, + BS MS: no deformity or atrophy  Skin: warm and dry, no rash Neuro:  Alert and Oriented x 3, Strength and sensation are intact Psych: euthymic mood, full affect  Assessment and plan  Chest pain -Does have some atypical features, somewhat constant  duration, possible musculoskeletal/GI however he does have risk factors such as peripheral vascular disease.  Previous catheterization was done in 2012 and showed only minimal CAD.  He is quite adamant about leaving the hospital at this point and would like further work-up to continue as an outpatient.  This seems reasonable given his negative troponin and low risk EKG.  He is no longer having any chest discomfort.  Echocardiogram preliminarily demonstrates normal EF with mildly thickened mitral valve and mild mitral regurgitation.  Overall reassuring.  I am comfortable with him continuing his work at this point.  Perhaps his rapid weight loss, phentermine may be playing a role in his symptoms.  We do not see any significant valvular abnormalities.  Nonetheless, given his risk factors, we will set him up as an outpatient with a nuclear stress test for further evaluation.  Obesity -Continue to encourage weight loss.  Good job with 40 pounds.  Phentermine may be contributing somewhat to his chest discomfort perhaps even from a noncardiac etiology.  Essential hypertension - Currently controlled on angiotensin receptor blocker.  As stated above, we will set him up for an outpatient stress test.  Echocardiogram overall reassuring EF.  If symptoms worsen or become more worrisome, he knows to seek medical attention.  Okay with discharge.  Candee Furbish, MD

## 2018-04-26 NOTE — ED Provider Notes (Signed)
Patient presented to the ER with chest discomfort.  Face to face Exam: HEENT - PERRLA Lungs - CTAB Heart - RRR, no M/R/G Abd - S/NT/ND Neuro - alert, oriented x3  Plan: Patient with previous coronary artery disease, last cath 5 years ago, presents to the emergency department with chest discomfort.  He has moderate cardiac risk, recommended hospitalization for further cardiac rule out.  Patient in agreement at this time.   Orpah Greek, MD 04/26/18 [phone]

## 2018-04-28 ENCOUNTER — Telehealth: Payer: Self-pay | Admitting: *Deleted

## 2018-04-28 NOTE — Telephone Encounter (Signed)
Left voicemail requesting call back.  

## 2018-04-29 NOTE — Telephone Encounter (Signed)
Left voicemail requesting call back.  

## 2018-04-30 NOTE — Progress Notes (Signed)
@Patient  ID: Todd Weeks, male    DOB: 11/04/1955, 62 y.o.   MRN: [id number]  Chief Complaint  Patient presents with  . Hospitalization Follow-up    SOB    Referring provider: Briscoe Deutscher, DO  HPI:  62 year old male former smoker last seen in our office in April/2017 followed for dyspnea on exertion.  PMH:  hypertension, obesity, peripheral arterial disease, and mild nonobstructive coronary artery disease on remote cardiac cath Smoker/ Smoking History: Former smoker.  69 pack years. Maintenance:  none Pt of: Dr. Melvyn Novas  Recent [facility] Encounters:   Last seen in office on 11/22/2015  05/01/2018  - Visit   62 year old male former smoker presenting today for hospital follow-up.  Patient reports that he was seen for 1 night in the hospital.  Is told to follow-up outpatient with cardiology.  Initial appointment is on 05/12/2018.  Patient has not been seen by primary care yet, patient reports that he spoke with a nurse earlier this week and plans to see primary care next week.  Patient has not been seen in our office since 2017.  Patient reports that about 10 days ago he started to have symptoms of increased dyspnea and chest pain which made the patient suspect pleurisy.  After searching pleurisy on the Internet patient was concerned that he had this diagnosis and presented to the emergency room.  Lab work in hospitalization was unremarkable, mildly elevated BUN.  Echocardiogram showed ejection fraction 55 to 60%, main pulmonary artery normal-sized.  Patient denies any other symptoms such as fever, congestion, cough.  Patient reports he is just fatigued and has been increasingly short of breath although it has improved over the last week.      Tests:   04/26/2018- troponins in the ED-negative 04/25/2018-CBC-normal 04/25/2018- normal with the exception of a mildly elevated BUN at 26  04/25/2018-chest x-ray-mild bronchitic changes, no focal consolidation, no pneumothorax, no  pleural effusions 04/26/2018-echocardiogram-LV ejection fraction 55 to 60%, main pulmonary artery normal-sized   10/27/2015-spirometry in office- normal spirometry, FVC 4.9 (99% predicted), ratio 71, FEV1 91% 10/28/2014-pulmonary function test- FVC 4.61 (88% predicted), postbronchodilator ratio 74, FEV1 88, no bronchodilator response, DLCO 84, minimal airway obstruction, suggesting small airway disease  Chart Review:  04/25/2018- hospitalization-chest pain >>>Discharge 04/26/2018 >>>Plan: Follow-up with PCP in 1 to 2 weeks, obtain BMP and CBC in 1 week, needs stress test outpatient   Specialty Problems      Pulmonary Problems   Lesion or mass of paranasal sinuses   Dyspnea    10/27/2015-spirometry in office- normal spirometry, FVC 4.9 (99% predicted), ratio 71, FEV1 91%  10/28/2014-pulmonary function test- FVC 4.61 (88% predicted), postbronchodilator ratio 74, FEV1 88, no bronchodilator response, DLCO 84, minimal airway obstruction, suggesting small airway disease  05/01/18 >>> pulmonary function test ordered>>> 05/01/18>>> Symbicort 80 ICS therapeutic trial>>>         No Known Allergies  Immunization History  Administered Date(s) Administered  . Influenza Split 05/14/2015  . Influenza,inj,Quad PF,6+ Mos 08/02/2016, 06/16/2017  . Influenza-Unspecified 06/16/2017  . Tdap 12/13/2014   Patient would like flu vaccine with primary care next week  Past Medical History:  Diagnosis Date  . Bell's palsy   . Closed nondisplaced fracture of neck of third metacarpal bone of right hand 02/04/2017  . Depression   . Diverticulitis   . Headache(784.0)   . HTN (hypertension)   . PVD (peripheral vascular disease) ([place])   . Sexual dysfunction     Tobacco History: Social History  Tobacco Use  Smoking Status Former Smoker  . Packs/day: 1.50  . Years: 46.00  . Pack years: 69.00  . Types: Cigarettes  . Last attempt to quit: 12/12/2011  . Years since quitting: 6.3  Smokeless Tobacco Never  Used   Counseling given: Yes  Continue to not smoke  Outpatient Encounter Medications as of 05/01/2018  Medication Sig  . clopidogrel (PLAVIX) 75 MG tablet TAKE (1) TABLET BY MOUTH ONCE DAILY. (Patient taking differently: Take 75 mg by mouth daily. )  . famotidine (PEPCID) 20 MG tablet TAKE (1) TABLET BY MOUTH AT BEDTIME. (Patient taking differently: Take 20 mg by mouth at bedtime. )  . irbesartan (AVAPRO) 150 MG tablet TAKE 2 TABLETS BY MOUTH DAILY. (Patient taking differently: Take 300 mg by mouth every morning. )  . simvastatin (ZOCOR) 20 MG tablet TAKE 1 TABLET BY MOUTH DAILY AT 6 PM. (Patient taking differently: Take 20 mg by mouth at bedtime. )  . budesonide-formoterol (SYMBICORT) 80-4.5 MCG/ACT inhaler Inhale 2 puffs into the lungs 2 (two) times daily.  . Vitamin D, Ergocalciferol, (DRISDOL) 50000 units CAPS capsule Take 1 capsule (50,000 Units total) by mouth every 7 (seven) days. (Patient not taking: Reported on 05/01/2018)   No facility-administered encounter medications on file as of 05/01/2018.      Review of Systems  Review of Systems  Constitutional: Positive for fatigue. Negative for activity change, chills, fever and unexpected weight change.  HENT: Negative for postnasal drip, rhinorrhea, sinus pressure, sinus pain, sneezing and sore throat.   Eyes: Negative.   Respiratory: Positive for shortness of breath. Negative for cough and wheezing.   Cardiovascular: Negative for chest pain and palpitations.  Gastrointestinal: Negative for constipation, diarrhea, nausea and vomiting.  Endocrine: Negative.   Genitourinary: Negative.   Musculoskeletal: Negative.   Skin: Negative.   Neurological: Negative for dizziness and headaches.  Psychiatric/Behavioral: Negative.  Negative for dysphoric mood. The patient is not nervous/anxious.   All other systems reviewed and are negative.    Physical Exam  BP (!) 168/80 (BP Location: Left Arm, Cuff Size: Normal)   Pulse 64   Ht 6'  (1.829 m)   Wt 254 lb (115.2 kg)   SpO2 100%   BMI 34.45 kg/m   Wt Readings from Last 5 Encounters:  05/01/18 254 lb (115.2 kg)  04/26/18 249 lb 6.4 oz (113.1 kg)  03/31/18 263 lb 3.2 oz (119.4 kg)  11/18/17 283 lb 3.2 oz (128.5 kg)  09/02/17 292 lb (132.5 kg)   >>> Patient reports blood pressure is elevated because he had to wait to be seen in our office visit today, informed patient that blood pressure is elevated he needs to be following up with primary care regarding this result  Physical Exam  Constitutional: He is oriented to person, place, and time and well-developed, well-nourished, and in no distress. No distress.  HENT:  Head: Normocephalic and atraumatic.  Right Ear: Hearing, tympanic membrane, external ear and ear canal normal.  Left Ear: Hearing, tympanic membrane, external ear and ear canal normal.  Nose: Nose normal. Right sinus exhibits no maxillary sinus tenderness and no frontal sinus tenderness. Left sinus exhibits no maxillary sinus tenderness and no frontal sinus tenderness.  Mouth/Throat: Uvula is midline and oropharynx is clear and moist. No oropharyngeal exudate.  Eyes: Pupils are equal, round, and reactive to light.  Neck: Normal range of motion. Neck supple. No JVD present.  Cardiovascular: Normal rate, regular rhythm and normal heart sounds.  Pulmonary/Chest: Effort normal and  breath sounds normal. No accessory muscle usage. No respiratory distress. He has no decreased breath sounds. He has no wheezes. He has no rhonchi.  Abdominal: Soft. Bowel sounds are normal. There is no tenderness.  Musculoskeletal: Normal range of motion. He exhibits no edema.  Lymphadenopathy:    He has no cervical adenopathy.  Neurological: He is alert and oriented to person, place, and time. Gait normal.  Skin: Skin is warm and dry. He is not diaphoretic. No erythema.  Psychiatric: Mood, memory, affect and judgment normal.  Nursing note and vitals reviewed.     Lab  Results:  CBC    Component Value Date/Time   WBC 9.1 04/25/2018 2243   RBC 5.04 04/25/2018 2243   HGB 14.8 04/25/2018 2243   HCT 45.3 04/25/2018 2243   PLT 255 04/25/2018 2243   MCV 89.9 04/25/2018 2243   MCV 88.6 10/19/2014 0918   MCH 29.4 04/25/2018 2243   MCHC 32.7 04/25/2018 2243   RDW 12.3 04/25/2018 2243   LYMPHSABS 2.0 09/02/2017 1345   MONOABS 0.8 09/02/2017 1345   EOSABS 0.3 09/02/2017 1345   BASOSABS 0.0 09/02/2017 1345    BMET    Component Value Date/Time   NA 139 04/25/2018 2243   K 3.8 04/25/2018 2243   CL 104 04/25/2018 2243   CO2 23 04/25/2018 2243   GLUCOSE 95 04/25/2018 2243   BUN 26 (H) 04/25/2018 2243   CREATININE 0.74 04/25/2018 2243   CALCIUM 9.6 04/25/2018 2243   GFRNONAA >60 04/25/2018 2243   GFRAA >60 04/25/2018 2243    BNP No results found for: BNP  ProBNP No results found for: PROBNP  Imaging: Dg Chest 2 View  Result Date: 04/25/2018 CLINICAL DATA:  Shortness of breath and chest pain for a few days. EXAM: CHEST - 2 VIEW COMPARISON:  Chest radiograph August 05, 2007 FINDINGS: Cardiomediastinal silhouette is normal. No pleural effusions or focal consolidations. Mild bronchitic changes. Trachea projects midline and there is no pneumothorax. Soft tissue planes and included osseous structures are non-suspicious. IMPRESSION: Mild bronchitic changes without focal consolidation. Electronically Signed   By: Elon Alas M.D.   On: 04/25/2018 23:21      Assessment & Plan:   Pleasant 62 year old patient seen office visit today.  We will have patient do a therapeutic trial of Symbicort 80.  We will also repeat pulmonary function testing.  We will have patient continue follow-up with cardiology as well as primary care.  We will bring patient back in 6 to 8 weeks after completion of pulmonary function test and trial of ICS.  Patient will get flu vaccine at primary care office visit next week.  Obesity (BMI 30.0-34.9) Continue to work towards  healthy weight Praised patient for continued focus on diet and exercise   Former smoker Could consider referral to lung cancer screening program for patient based off of pack-year smoking history.  Address at follow-up visit.  Patient was quite overwhelmed today with recent work-up for cardiac as well as pulmonary function test.  We will hold off on referring for lung cancer screening program at this time.  Patient will qualify based off of smoking history and when he quit.  Dyspnea Pulmonary function test ordered today  Start Symbicort 80 >>> 2 puffs in the morning right when you wake up, rinse out your mouth after use, 12 hours later 2 puffs, rinse after use >>> Take this daily, no matter what >>> This is not a rescue inhaler  >>> Call us in a  week and let us know how you are doing with this inhaler, if you feel that the breathing has improved we can place a prescription for you, the samples for 2 weeks  Continue Pepcid at night Review GERD literature below regarding diet  We recommend that you get your flu vaccine, you can do this with your primary care when you see them next week, Lab work will also need to be completed at your primary care office based off the hospital discharge  6 to 8-week follow-up with our office after completing cardiopulmonary stress test and cardiology appointment as well as primary care appointment.    HTN (hypertension) Blood pressure is elevated today.  Patient to continue to monitor and follow-up with primary care next week.  If elevated with primary care may need additional medications to manage.  Patient to continue to work on diet and exercise.  GERD (gastroesophageal reflux disease) Continue Pepcid at night Review GERD literature below regarding diet       Lauraine Rinne, NP 05/01/2018

## 2018-05-01 ENCOUNTER — Encounter: Payer: Self-pay | Admitting: Pulmonary Disease

## 2018-05-01 ENCOUNTER — Ambulatory Visit: Payer: PPO | Admitting: Pulmonary Disease

## 2018-05-01 VITALS — BP 168/80 | HR 64 | Ht 72.0 in | Wt 254.0 lb

## 2018-05-01 DIAGNOSIS — R0600 Dyspnea, unspecified: Secondary | ICD-10-CM | POA: Insufficient documentation

## 2018-05-01 DIAGNOSIS — E669 Obesity, unspecified: Secondary | ICD-10-CM | POA: Diagnosis not present

## 2018-05-01 DIAGNOSIS — Z87891 Personal history of nicotine dependence: Secondary | ICD-10-CM | POA: Diagnosis not present

## 2018-05-01 DIAGNOSIS — K219 Gastro-esophageal reflux disease without esophagitis: Secondary | ICD-10-CM

## 2018-05-01 DIAGNOSIS — R0609 Other forms of dyspnea: Secondary | ICD-10-CM | POA: Diagnosis not present

## 2018-05-01 DIAGNOSIS — I10 Essential (primary) hypertension: Secondary | ICD-10-CM

## 2018-05-01 MED ORDER — BUDESONIDE-FORMOTEROL FUMARATE 80-4.5 MCG/ACT IN AERO: 2.0000 | INHALATION_SPRAY | Freq: Two times a day (BID) | RESPIRATORY_TRACT | 0 refills | Status: DC

## 2018-05-01 NOTE — Assessment & Plan Note (Signed)
Could consider referral to lung cancer screening program for patient based off of pack-year smoking history.  Address at follow-up visit.  Patient was quite overwhelmed today with recent work-up for cardiac as well as pulmonary function test.  We will hold off on referring for lung cancer screening program at this time.  Patient will qualify based off of smoking history and when he quit.

## 2018-05-01 NOTE — Assessment & Plan Note (Signed)
Pulmonary function test ordered today  Start Symbicort 80 >>> 2 puffs in the morning right when you wake up, rinse out your mouth after use, 12 hours later 2 puffs, rinse after use >>> Take this daily, no matter what >>> This is not a rescue inhaler  >>> Call us in a week and let us know how you are doing with this inhaler, if you feel that the breathing has improved we can place a prescription for you, the samples for 2 weeks  Continue Pepcid at night Review GERD literature below regarding diet  We recommend that you get your flu vaccine, you can do this with your primary care when you see them next week, Lab work will also need to be completed at your primary care office based off the hospital discharge  6 to 8-week follow-up with our office after completing cardiopulmonary stress test and cardiology appointment as well as primary care appointment.

## 2018-05-01 NOTE — Assessment & Plan Note (Signed)
Continue to work towards healthy weight Praised patient for continued focus on diet and exercise

## 2018-05-01 NOTE — Assessment & Plan Note (Signed)
Continue Pepcid at night Review GERD literature below regarding diet

## 2018-05-01 NOTE — Patient Instructions (Addendum)
Pulmonary function test ordered today  Start Symbicort 80 >>> 2 puffs in the morning right when you wake up, rinse out your mouth after use, 12 hours later 2 puffs, rinse after use >>> Take this daily, no matter what >>> This is not a rescue inhaler  >>> Call us in a week and let us know how you are doing with this inhaler, if you feel that the breathing has improved we can place a prescription for you, the samples for 2 weeks  Continue Pepcid at night Review GERD literature below regarding diet  We recommend that you get your flu vaccine, you can do this with your primary care when you see them next week, Lab work will also need to be completed at your primary care office based off the hospital discharge  6 to 8-week follow-up with our office after completing cardiopulmonary stress test and cardiology appointment as well as primary care appointment.   It is flu season:   >>>Remember to be washing your hands regularly, using hand sanitizer, be careful to use around herself with has contact with people who are sick will increase her chances of getting sick yourself. >>> Best ways to protect herself from the flu: Receive the yearly flu vaccine, practice good hand hygiene washing with soap and also using hand sanitizer when available, eat a nutritious meals, get adequate rest, hydrate appropriately    As of 06/16/2018 we will be moving! We will no longer be at our [place] location.   Our new address and phone number will be:  [address] [place], [place] [address] Telephone number: [phone]   Please contact the office if your symptoms worsen or you have concerns that you are not improving.   Thank you for choosing [facility] for your healthcare, and for allowing [place] to partner with you on your healthcare journey. I am thankful to be able to provide care to you today.   Wyn Quaker FNP-C    Food Choices for Gastroesophageal Reflux Disease, Adult When you have  gastroesophageal reflux disease (GERD), the foods you eat and your eating habits are very important. Choosing the right foods can help ease your discomfort. What guidelines do I need to follow?  Choose fruits, vegetables, whole grains, and low-fat dairy products.  Choose low-fat meat, fish, and poultry.  Limit fats such as oils, salad dressings, butter, nuts, and avocado.  Keep a food diary. This helps you identify foods that cause symptoms.  Avoid foods that cause symptoms. These may be different for everyone.  Eat small meals often instead of 3 large meals a day.  Eat your meals slowly, in a place where you are relaxed.  Limit fried foods.  Cook foods using methods other than frying.  Avoid drinking alcohol.  Avoid drinking large amounts of liquids with your meals.  Avoid bending over or lying down until 2-3 hours after eating. What foods are not recommended? These are some foods and drinks that may make your symptoms worse: Vegetables Tomatoes. Tomato juice. Tomato and spaghetti sauce. Chili peppers. Onion and garlic. Horseradish. Fruits Oranges, grapefruit, and lemon (fruit and juice). Meats High-fat meats, fish, and poultry. This includes hot dogs, ribs, ham, sausage, salami, and bacon. Dairy Whole milk and chocolate milk. Sour cream. Cream. Butter. Ice cream. Cream cheese. Drinks Coffee and tea. Bubbly (carbonated) drinks or energy drinks. Condiments Hot sauce. Barbecue sauce. Sweets/Desserts Chocolate and cocoa. Donuts. Peppermint and spearmint. Fats and Oils High-fat foods. This includes [place]  fries and potato chips. Other Vinegar. Strong spices. This includes black pepper, white pepper, red pepper, cayenne, curry powder, cloves, ginger, and chili powder. The items listed above may not be a complete list of foods and drinks to avoid. Contact your dietitian for more information. This information is not intended to replace advice given to you by your health care  provider. Make sure you discuss any questions you have with your health care provider. Document Released: 01/29/2012 Document Revised: 01/05/2016 Document Reviewed: 06/03/2013 Elsevier Interactive Patient Education  2017 [place].     Gastroesophageal Reflux Disease, Adult Normally, food travels down the esophagus and stays in the stomach to be digested. If a person has gastroesophageal reflux disease (GERD), food and stomach acid move back up into the esophagus. When this happens, the esophagus becomes sore and swollen (inflamed). Over time, GERD can make small holes (ulcers) in the lining of the esophagus. Follow these instructions at home: Diet  Follow a diet as told by your doctor. You may need to avoid foods and drinks such as: ? Coffee and tea (with or without caffeine). ? Drinks that contain alcohol. ? Energy drinks and sports drinks. ? Carbonated drinks or sodas. ? Chocolate and cocoa. ? Peppermint and mint flavorings. ? Garlic and onions. ? Horseradish. ? Spicy and acidic foods, such as peppers, chili powder, curry powder, vinegar, hot sauces, and BBQ sauce. ? Citrus fruit juices and citrus fruits, such as oranges, lemons, and limes. ? Tomato-based foods, such as red sauce, chili, salsa, and pizza with red sauce. ? Fried and fatty foods, such as donuts, french fries, potato chips, and high-fat dressings. ? High-fat meats, such as hot dogs, rib eye steak, sausage, ham, and bacon. ? High-fat dairy items, such as whole milk, butter, and cream cheese.  Eat small meals often. Avoid eating large meals.  Avoid drinking large amounts of liquid with your meals.  Avoid eating meals during the 2-3 hours before bedtime.  Avoid lying down right after you eat.  Do not exercise right after you eat. General instructions  Pay attention to any changes in your symptoms.  Take over-the-counter and prescription medicines only as told by your doctor. Do not take aspirin, ibuprofen,  or other NSAIDs unless your doctor says it is okay.  Do not use any tobacco products, including cigarettes, chewing tobacco, and e-cigarettes. If you need help quitting, ask your doctor.  Wear loose clothes. Do not wear anything tight around your waist.  Raise (elevate) the head of your bed about 6 inches (15 cm).  Try to lower your stress. If you need help doing this, ask your doctor.  If you are overweight, lose an amount of weight that is healthy for you. Ask your doctor about a safe weight loss goal.  Keep all follow-up visits as told by your doctor. This is important. Contact a doctor if:  You have new symptoms.  You lose weight and you do not know why it is happening.  You have trouble swallowing, or it hurts to swallow.  You have wheezing or a cough that keeps happening.  Your symptoms do not get better with treatment.  You have a hoarse voice. Get help right away if:  You have pain in your arms, neck, jaw, teeth, or back.  You feel sweaty, dizzy, or light-headed.  You have chest pain or shortness of breath.  You throw up (vomit) and your throw up looks like blood or coffee grounds.  You pass out (faint).  Your poop (stool) is bloody or black.  You cannot swallow, drink, or eat. This information is not intended to replace advice given to you by your health care provider. Make sure you discuss any questions you have with your health care provider. Document Released: 01/16/2008 Document Revised: 01/05/2016 Document Reviewed: 11/24/2014 Elsevier Interactive Patient Education  [employer].

## 2018-05-01 NOTE — Assessment & Plan Note (Signed)
Blood pressure is elevated today.  Patient to continue to monitor and follow-up with primary care next week.  If elevated with primary care may need additional medications to manage.  Patient to continue to work on diet and exercise.

## 2018-05-02 NOTE — Telephone Encounter (Signed)
Per chart review: Admit date: 04/25/2018 Discharge date: 04/26/2018  Admitted From: Home Disposition:  Home  Recommendations for Outpatient Follow-up:  1. Follow up with PCP in 1-2 weeks 2. Please obtain BMP/CBC in one week 3. Needs stress test out patient   Home Health: none  Discharge Condition: stable.  CODE STATUS: full code.  Diet recommendation: Heart Healthy  _________________________________________________________________ Per telephone call: Transition Care Management Follow-up Telephone Call   Date discharged? 04/26/18   How have you been since you were released from the hospital? "ok"   Do you understand why you were in the hospital? yes   Do you understand the discharge instructions? yes   Where were you discharged to? Home   Items Reviewed:  Medications reviewed: yes  Allergies reviewed: yes  Dietary changes reviewed: yes  Referrals reviewed: yes   Functional Questionnaire:   Activities of Daily Living (ADLs):   He states they are independent in the following: ambulation, bathing and hygiene, feeding, continence, grooming, toileting and dressing States they require assistance with the following: none   Any transportation issues/concerns?: no   Any patient concerns? no   Confirmed importance and date/time of follow-up visits scheduled yes  Provider Appointment booked with Dr Juleen China 05/07/18 4:00  Confirmed with patient if condition begins to worsen call PCP or go to the ER.  Patient was given the office number and encouraged to call back with question or concerns.  : yes

## 2018-05-02 NOTE — Telephone Encounter (Addendum)
Attempted to call patient, left voicemail on home and cell phone requesting call back.

## 2018-05-04 NOTE — Progress Notes (Signed)
Chart and office note reviewed in detail  > agree with a/p as outlined    

## 2018-05-05 ENCOUNTER — Telehealth: Payer: Self-pay | Admitting: Family Medicine

## 2018-05-05 NOTE — Telephone Encounter (Signed)
No

## 2018-05-05 NOTE — Telephone Encounter (Signed)
See note

## 2018-05-05 NOTE — Telephone Encounter (Signed)
Copied from [place] [id number]. Topic: Quick Communication - Rx Refill/Question >> May 05, 2018  3:07 PM Gardiner Ramus wrote: Medication: Vitamin D, Ergocalciferol, (DRISDOL) 50000 units CAPS capsule [[id number]] pt states that hospital did not give him a printed RX.   Has the patient contacted their pharmacy? no Preferred Pharmacy (with phone number or street name): [facility], [place] [phone] (Phone) [phone] (Fax)   Agent: Please be advised that RX refills may take up to 3 business days. We ask that you follow-up with your pharmacy.

## 2018-05-05 NOTE — Telephone Encounter (Signed)
Does patient need refill?

## 2018-05-06 ENCOUNTER — Telehealth (HOSPITAL_COMMUNITY): Payer: Self-pay | Admitting: *Deleted

## 2018-05-06 ENCOUNTER — Telehealth: Payer: Self-pay | Admitting: Family Medicine

## 2018-05-06 NOTE — Telephone Encounter (Signed)
Patient given detailed instructions per Myocardial Perfusion Study Information Sheet for the test on 05/12/18 at 0730. Patient notified to arrive 15 minutes early and that it is imperative to arrive on time for appointment to keep from having the test rescheduled.  If you need to cancel or reschedule your appointment, please call the office within 24 hours of your appointment. . Patient verbalized understanding.Isidro Monks, Ranae Palms

## 2018-05-06 NOTE — Telephone Encounter (Signed)
Copied from [place] [id number]. Topic: General - Other >> May 06, 2018 12:57 PM Mcneil, Ja-Kwan wrote: Reason for CRM: Pt returned call to office. Pt requests call back. Cb# [phone]

## 2018-05-06 NOTE — Telephone Encounter (Signed)
Left message for patient to return call to office. 

## 2018-05-06 NOTE — Telephone Encounter (Signed)
Left message for patient to return call to office. There is two message for this.

## 2018-05-06 NOTE — Telephone Encounter (Signed)
Left message for patient to call the office

## 2018-05-06 NOTE — Telephone Encounter (Signed)
Spoke with the patient to let him know that he can take the OTC Vitamin D 1000-2000 units daily.Patient verbalized understanding.

## 2018-05-07 ENCOUNTER — Encounter: Payer: Self-pay | Admitting: Family Medicine

## 2018-05-07 ENCOUNTER — Ambulatory Visit (INDEPENDENT_AMBULATORY_CARE_PROVIDER_SITE_OTHER): Payer: PPO | Admitting: Family Medicine

## 2018-05-07 VITALS — BP 130/68 | HR 74 | Temp 98.0°F | Ht 72.0 in | Wt 250.6 lb

## 2018-05-07 DIAGNOSIS — I2510 Atherosclerotic heart disease of native coronary artery without angina pectoris: Secondary | ICD-10-CM | POA: Diagnosis not present

## 2018-05-07 DIAGNOSIS — I70219 Atherosclerosis of native arteries of extremities with intermittent claudication, unspecified extremity: Secondary | ICD-10-CM | POA: Diagnosis not present

## 2018-05-07 DIAGNOSIS — Z09 Encounter for follow-up examination after completed treatment for conditions other than malignant neoplasm: Secondary | ICD-10-CM

## 2018-05-07 DIAGNOSIS — I10 Essential (primary) hypertension: Secondary | ICD-10-CM

## 2018-05-07 DIAGNOSIS — E669 Obesity, unspecified: Secondary | ICD-10-CM

## 2018-05-07 DIAGNOSIS — E782 Mixed hyperlipidemia: Secondary | ICD-10-CM

## 2018-05-07 DIAGNOSIS — R0609 Other forms of dyspnea: Secondary | ICD-10-CM | POA: Diagnosis not present

## 2018-05-07 NOTE — Progress Notes (Signed)
Todd Weeks is a 62 y.o. male is here for follow up.  History of Present Illness:   HPI: Medical history significant forhypertension, obesity, peripheral arterial disease, and mild nonobstructive coronary artery disease on remote cardiac cath, now presenting to the emergency department for evaluation of shortness of breath. He denies now that he had CP, but documented in ED report. Admitted to telemetry unit after normal work-up. Cardiology evaluated in ED - set up for upcoming nuclear stress test. He also has follow up scheduled with Pulmonology.   Previously on Phentermine. He admitted at last visit that he had been taking Hydroxycut for weight loss. We negotiated to Phentermine 1/2 dose daily with precautions. Down 40 pounds. He admits that he is back on Hydroxycut now.  Leg pain. With walking. Has to stop to make better. Vascular intervention in the past.   Health Maintenance Due  Topic Date Due  . INFLUENZA VACCINE  03/13/2018   Depression screen [facility] 2/9 08/12/2017 01/21/2017 08/02/2016  Decreased Interest 0 0 1  Down, Depressed, Hopeless 1 0 1  PHQ - 2 Score 1 0 2  Altered sleeping 0 - 3  Tired, decreased energy 0 - 3  Change in appetite 0 - 2  Feeling bad or failure about yourself  0 - 2  Trouble concentrating 0 - 0  Moving slowly or fidgety/restless 0 - 0  Suicidal thoughts 0 - 0  PHQ-9 Score 1 - 12  Difficult doing work/chores Not difficult at all - Not difficult at all   PMHx, SurgHx, SocialHx, FamHx, Medications, and Allergies were reviewed in the Visit Navigator and updated as appropriate.   Patient Active Problem List   Diagnosis Date Noted  . Dyspnea 05/01/2018  . GERD (gastroesophageal reflux disease) 05/01/2018  . Insulin resistance 09/03/2017  . Numbness of right anterior thigh 01/21/2017  . Lesion or mass of paranasal sinuses 01/18/2016  . Obesity (BMI 30.0-34.9) 10/29/2015  . Subacromial bursitis 06/01/2015  . Hyperlipidemia 05/04/2015  .  Atherosclerosis of native arteries of extremity with intermittent claudication ([place]) 05/22/2012  . CAD (coronary artery disease) 02/02/2011  . Abnormal cardiovascular function study 01/04/2011  . Chest pain 11/09/2010  . Elevated PSA 08/06/2007  . Former smoker 08/01/2007  . HTN (hypertension) 08/01/2007   Social History   Tobacco Use  . Smoking status: Former Smoker    Packs/day: 1.50    Years: 46.00    Pack years: 69.00    Types: Cigarettes    Last attempt to quit: 12/12/2011    Years since quitting: 6.4  . Smokeless tobacco: Never Used  Substance Use Topics  . Alcohol use: Yes    Alcohol/week: 1.0 standard drinks    Types: 1 Cans of beer per week    Comment: a couple of beer on a weekend  . Drug use: Yes    Types: Cocaine, Marijuana, "Crack" cocaine    Comment: not since 1999   Current Medications and Allergies:   .  budesonide-formoterol (SYMBICORT) 80-4.5 MCG/ACT inhaler, Inhale 2 puffs into the lungs 2 (two) times daily., Disp: 1 Inhaler, Rfl: 0 .  clopidogrel (PLAVIX) 75 MG tablet, TAKE (1) TABLET BY MOUTH ONCE DAILY. (Patient taking differently: Take 75 mg by mouth daily. ), Disp: 90 tablet, Rfl: 1 .  famotidine (PEPCID) 20 MG tablet, TAKE (1) TABLET BY MOUTH AT BEDTIME. (Patient taking differently: Take 20 mg by mouth at bedtime. ), Disp: 90 tablet, Rfl: 0 .  irbesartan (AVAPRO) 150 MG tablet, TAKE 2 TABLETS  BY MOUTH DAILY. (Patient taking differently: Take 300 mg by mouth every morning. ), Disp: 180 tablet, Rfl: 0 .  simvastatin (ZOCOR) 20 MG tablet, TAKE 1 TABLET BY MOUTH DAILY AT 6 PM. (Patient taking differently: Take 20 mg by mouth at bedtime. ), Disp: 90 tablet, Rfl: 0  No Known Allergies   Review of Systems   Pertinent items are noted in the HPI. Otherwise, ROS is negative.  Vitals:   Vitals:   05/07/18 1535  BP: 130/68  Pulse: 74  Temp: 98 F (36.7 C)  TempSrc: Oral  SpO2: 98%  Weight: 250 lb 9.6 oz (113.7 kg)  Height: 6' (1.829 m)     Body mass  index is 33.99 kg/m.  Physical Exam:   Physical Exam  Constitutional: He is oriented to person, place, and time. He appears well-developed and well-nourished. No distress.  HENT:  Head: Normocephalic and atraumatic.  Right Ear: External ear normal.  Left Ear: External ear normal.  Nose: Nose normal.  Mouth/Throat: Oropharynx is clear and moist.  Eyes: Pupils are equal, round, and reactive to light. Conjunctivae and EOM are normal.  Neck: Normal range of motion. Neck supple.  Cardiovascular: Normal rate, regular rhythm, normal heart sounds and intact distal pulses.  Pulmonary/Chest: Effort normal and breath sounds normal.  Abdominal: Soft. Bowel sounds are normal.  Musculoskeletal: Normal range of motion.  Neurological: He is alert and oriented to person, place, and time.  Skin: Skin is warm and dry.  Psychiatric: He has a normal mood and affect. His behavior is normal. Judgment and thought content normal.  Nursing note and vitals reviewed.  Assessment and Plan:   Todd Weeks was seen today for hospitalization follow-up.  Diagnoses and all orders for this visit:  Hospital discharge follow-up Medication reconciliation:  [x]   Medication list updated [x]   New medication list given to patient/family/caregiver  Referrals: []   None needed [x]   Referrals made to: [facility]  Community resources identified for patient/family:  [x]   None needed  []   Home health agency []   Assisted living  []   Hospice  []   Support group  []   Education program  Durable medical equipment ordered:  [x]   None needed  []   DME ordered:   Additional communication delivered or planned:  []   Family/Caregiver:  [x]   Specialists:  []   Other:  Patient education: Topics discussed: AS ABOVE Handouts given: SEE AVS  Initial transitional care contact was made on 04/28/18 (see separate note).  Essential hypertension Comments: On Avapro. Compliant and without side-effects.  Orders: -     CBC with  Differential/Platelet -     Comprehensive metabolic panel -     Magnesium  Obesity (BMI 30.0-34.9) Comments: He has been working on weight loss. Previously taking Hydroxycut. Discussed at last visit. I okayed 1/2 dose phentermine to avoid the supplement. He did well, losing 40 pounds. Off now since hospital visit. Back on Hydroxycut. He is aware of the risks.   Dyspnea on exertion Comments: Has follow up with Cardiology for nuclear stress test and Pulmonology for PFTs. No new or worsening symptoms.   Mixed hyperlipidemia Comments: On Zocor. Compliant and without side effects.   Atherosclerosis of native artery of lower extremity with intermittent claudication, unspecified laterality ([place]) Comments: Concern for claudication based on current symptoms. Will refer to Vascular again. Orders: -     Ambulatory referral to Vascular Surgery  Coronary artery disease involving native coronary artery of native heart without angina pectoris Comments: On Plavix. No bleeding  complaints.    . Reviewed expectations re: course of current medical issues. . Discussed self-management of symptoms. . Outlined signs and symptoms indicating need for more acute intervention. . Patient verbalized understanding and all questions were answered. Marland Kitchen Health Maintenance issues including appropriate healthy diet, exercise, and smoking avoidance were discussed with patient. . See orders for this visit as documented in the electronic medical record. . Patient received an After Visit Summary.  Briscoe Deutscher, DO [place] 05/08/2018

## 2018-05-08 LAB — CBC WITH DIFFERENTIAL/PLATELET
Basophils Absolute: 0.1 10*3/uL (ref 0.0–0.1)
Basophils Relative: 0.7 % (ref 0.0–3.0)
Eosinophils Absolute: 0.2 10*3/uL (ref 0.0–0.7)
Eosinophils Relative: 2.4 % (ref 0.0–5.0)
HCT: 42.9 % (ref 39.0–52.0)
Hemoglobin: 14.6 g/dL (ref 13.0–17.0)
Lymphocytes Relative: 22.3 % (ref 12.0–46.0)
Lymphs Abs: 2.1 10*3/uL (ref 0.7–4.0)
MCHC: 34.1 g/dL (ref 30.0–36.0)
MCV: 88 fl (ref 78.0–100.0)
Monocytes Absolute: 0.7 10*3/uL (ref 0.1–1.0)
Monocytes Relative: 7.3 % (ref 3.0–12.0)
Neutro Abs: 6.3 10*3/uL (ref 1.4–7.7)
Neutrophils Relative %: 67.3 % (ref 43.0–77.0)
Platelets: 270 10*3/uL (ref 150.0–400.0)
RBC: 4.87 Mil/uL (ref 4.22–5.81)
RDW: 13.1 % (ref 11.5–15.5)
WBC: 9.3 10*3/uL (ref 4.0–10.5)

## 2018-05-08 LAB — COMPREHENSIVE METABOLIC PANEL
ALT: 33 U/L (ref 0–53)
AST: 22 U/L (ref 0–37)
Albumin: 4.7 g/dL (ref 3.5–5.2)
Alkaline Phosphatase: 74 U/L (ref 39–117)
BUN: 19 mg/dL (ref 6–23)
CO2: 27 mEq/L (ref 19–32)
Calcium: 9.7 mg/dL (ref 8.4–10.5)
Chloride: 101 mEq/L (ref 96–112)
Creatinine, Ser: 0.73 mg/dL (ref 0.40–1.50)
GFR: 115.1 mL/min (ref >60.00)
Glucose, Bld: 86 mg/dL (ref 70–99)
Potassium: 4 mEq/L (ref 3.5–5.1)
Sodium: 137 mEq/L (ref 135–145)
Total Bilirubin: 0.7 mg/dL (ref 0.2–1.2)
Total Protein: 7.6 g/dL (ref 6.0–8.3)

## 2018-05-08 LAB — MAGNESIUM: Magnesium: 2.3 mg/dL (ref 1.5–2.5)

## 2018-05-11 ENCOUNTER — Encounter: Payer: Self-pay | Admitting: Family Medicine

## 2018-05-12 ENCOUNTER — Ambulatory Visit (HOSPITAL_COMMUNITY): Payer: PPO | Attending: Internal Medicine

## 2018-05-12 DIAGNOSIS — R9439 Abnormal result of other cardiovascular function study: Secondary | ICD-10-CM | POA: Insufficient documentation

## 2018-05-12 DIAGNOSIS — R079 Chest pain, unspecified: Secondary | ICD-10-CM | POA: Insufficient documentation

## 2018-05-12 DIAGNOSIS — I2510 Atherosclerotic heart disease of native coronary artery without angina pectoris: Secondary | ICD-10-CM | POA: Diagnosis not present

## 2018-05-12 DIAGNOSIS — I119 Hypertensive heart disease without heart failure: Secondary | ICD-10-CM | POA: Diagnosis not present

## 2018-05-12 LAB — MYOCARDIAL PERFUSION IMAGING
CHL CUP NUCLEAR SRS: 0
CSEPPHR: 76 {beats}/min
LVDIAVOL: 160 mL (ref 62–150)
LVSYSVOL: 75 mL
NUC STRESS TID: 1
Rest HR: 58 {beats}/min
SDS: 1
SSS: 1

## 2018-05-12 MED ORDER — REGADENOSON 0.4 MG/5ML IV SOLN
0.4000 mg | Freq: Once | INTRAVENOUS | Status: AC
  Administered 2018-05-12: 0.4 mg via INTRAVENOUS

## 2018-05-12 MED ORDER — TECHNETIUM TC 99M TETROFOSMIN IV KIT
10.1000 | PACK | Freq: Once | INTRAVENOUS | Status: AC | PRN
  Administered 2018-05-12: 10.1 via INTRAVENOUS
  Filled 2018-05-12: qty 11

## 2018-05-12 MED ORDER — TECHNETIUM TC 99M TETROFOSMIN IV KIT
31.5000 | PACK | Freq: Once | INTRAVENOUS | Status: AC | PRN
  Administered 2018-05-12: 31.5 via INTRAVENOUS
  Filled 2018-05-12: qty 32

## 2018-05-13 ENCOUNTER — Telehealth: Payer: Self-pay | Admitting: Pulmonary Disease

## 2018-05-13 MED ORDER — BUDESONIDE-FORMOTEROL FUMARATE 80-4.5 MCG/ACT IN AERO: 2.0000 | INHALATION_SPRAY | Freq: Two times a day (BID) | RESPIRATORY_TRACT | 11 refills | Status: DC

## 2018-05-13 NOTE — Telephone Encounter (Signed)
Called and spoke with patient, he stated that the symbicort is working well for him and he would like a refill. Refill sent. Nothing further needed.

## 2018-05-14 ENCOUNTER — Other Ambulatory Visit: Payer: Self-pay | Admitting: Family Medicine

## 2018-05-22 NOTE — Progress Notes (Signed)
Chief Complaint  Patient presents with  . Follow-up    CAD/PAD    History of Present Illness: 62 yo male with history of HTN, hyperlipidemia, PAD, tobacco abuse and depression here today to re-establish care in our office. I saw him remotely with last visit in June 2012. I had followed him for PAD in the past. Cardiac cath in May 2012 with mild disease in the RCA and no disease in the LAD or Circumflex. Lower extremity angiogram May 2012 with 80% proximal superficial femoral artery stenosis. The left superficial femoral artery was occluded at the ostium. A stent was placed in the right SFA in September 2012. He did not follow up after 2012. He was seen in the [facility] office by Dr. Oneida Alar in 2013 but has not followed up since then. He was admitted to [facility] September 2019 with dyspnea and chest pain. Troponin negative. Echo 04/26/18 with normal LV systolic function, KGUR=42-70%. There were no wall motion abnormalities. No significant valve disease. Nuclear stress test 05/12/18 with no ischemia.   He tells me today that he quit smoking in 2012. He recently lost 45 pounds. He has an appointment to see Dr. Oneida Alar in a few weeks. He has had no recurrent chest pain or dyspnea. No lower extremity edema. He has pain in both legs when walking a [place] distance. No rest pain although he has some cramping at night.   Primary Care Physician: Briscoe Deutscher, DO  Past Medical History:  Diagnosis Date  . Bell's palsy   . Closed nondisplaced fracture of neck of third metacarpal bone of right hand 02/04/2017  . Depression   . Diverticulitis   . Headache(784.0)   . HTN (hypertension)   . PVD (peripheral vascular disease) ([place])   . Sexual dysfunction     History reviewed. No pertinent surgical history.  Current Outpatient Medications  Medication Sig Dispense Refill  . budesonide-formoterol (SYMBICORT) 80-4.5 MCG/ACT inhaler Inhale 2 puffs into the lungs 2 (two) times daily. 1 Inhaler 11  . clopidogrel (PLAVIX) 75  MG tablet TAKE (1) TABLET BY MOUTH ONCE DAILY. 90 tablet 0  . famotidine (PEPCID) 20 MG tablet TAKE (1) TABLET BY MOUTH AT BEDTIME. 90 tablet 0  . irbesartan (AVAPRO) 150 MG tablet TAKE 2 TABLETS BY MOUTH DAILY. 180 tablet 0  . simvastatin (ZOCOR) 20 MG tablet TAKE 1 TABLET BY MOUTH DAILY AT 6 PM. 90 tablet 0   No current facility-administered medications for this visit.     No Known Allergies  Social History   Socioeconomic History  . Marital status: Single    Spouse name: Not on file  . Number of children: 2  . Years of education: 6  . Highest education level: Not on file  Occupational History  . Occupation: Electrical engineer  . Occupation: Unemployed    Comment: disability  Social Needs  . Financial resource strain: Not on file  . Food insecurity:    Worry: Not on file    Inability: Not on file  . Transportation needs:    Medical: Not on file    Non-medical: Not on file  Tobacco Use  . Smoking status: Former Smoker    Packs/day: 1.50    Years: 46.00    Pack years: 69.00    Types: Cigarettes    Last attempt to quit: 12/12/2011    Years since quitting: 6.4  . Smokeless tobacco: Never Used  Substance and Sexual Activity  . Alcohol use: Yes    Alcohol/week: 1.0  standard drinks    Types: 1 Cans of beer per week    Comment: a couple of beer on a weekend  . Drug use: Yes    Types: Cocaine, Marijuana, "Crack" cocaine    Comment: not since 1999  . Sexual activity: Not on file  Lifestyle  . Physical activity:    Days per week: Not on file    Minutes per session: Not on file  . Stress: Not on file  Relationships  . Social connections:    Talks on phone: Not on file    Gets together: Not on file    Attends religious service: Not on file    Active member of club or organization: Not on file    Attends meetings of clubs or organizations: Not on file    Relationship status: Not on file  . Intimate partner violence:    Fear of current or ex partner: Not on file     Emotionally abused: Not on file    Physically abused: Not on file    Forced sexual activity: Not on file  Other Topics Concern  . Not on file  Social History Narrative   Raised in [place], [place]. Does not have any religious beliefs that would effect healthcare. Lives in house with sister. Likes to ride motorcycle for fun.    Moving out on his own; in the next couple of weeks will be moving back home that he had rented      [place]; with a helmet    Family History  Problem Relation Age of Onset  . Hypertension Mother   . Deep vein thrombosis Father   . Diabetes Brother   . Tuberculosis Maternal Grandfather   . Emphysema Maternal Grandfather   . Lung disease Brother   . Asthma Son     Review of Systems:  As stated in the HPI and otherwise negative.   BP 124/78   Pulse 74   Ht 6' (1.829 m)   Wt 248 lb 12.8 oz (112.9 kg)   SpO2 93%   BMI 33.74 kg/m   Physical Examination: General: Well developed, well nourished, NAD  HEENT: OP clear, mucus membranes moist  SKIN: warm, dry. No rashes. Neuro: No focal deficits  Musculoskeletal: Muscle strength 5/5 all ext  Psychiatric: Mood and affect normal  Neck: No JVD, no carotid bruits, no thyromegaly, no lymphadenopathy.  Lungs:Clear bilaterally, no wheezes, rhonci, crackles Cardiovascular: Regular rate and rhythm. No murmurs, gallops or rubs. Abdomen:Soft. Bowel sounds present. Non-tender.  Extremities: No lower extremity edema.   EKG:  EKG is not ordered today. The ekg ordered today demonstrates   Echo 04/26/18: Left ventricle: The cavity size was normal. Systolic function was   normal. The estimated ejection fraction was in the range of 55%   to 60%. Wall motion was normal; there were no regional wall   motion abnormalities. - Aortic valve: Transvalvular velocity was within the normal range.   There was no stenosis. There was no significant regurgitation. - Mitral valve: There was trivial regurgitation. - Left atrium: The  atrium was mildly dilated. - Right ventricle: Systolic function was normal. - Right atrium: The atrium was mildly dilated. - Atrial septum: No defect or patent foramen ovale was identified. - Tricuspid valve: There was trivial regurgitation. - Pulmonic valve: There was no significant regurgitation. - Inferior vena cava: The vessel was normal in size. The   respirophasic diameter changes were in the normal range (>= 50%),   consistent with normal  central venous pressure.  Recent Labs: 09/02/2017: TSH 2.39 05/07/2018: ALT 33; BUN 19; Creatinine, Ser 0.73; Hemoglobin 14.6; Magnesium 2.3; Platelets 270.0; Potassium 4.0; Sodium 137   Lipid Panel    Component Value Date/Time   CHOL 117 03/31/2018 0723   CHOL 137 06/11/2014   TRIG 72.0 03/31/2018 0723   TRIG 173 06/11/2014   HDL 34.20 (L) 03/31/2018 0723   CHOLHDL 3 03/31/2018 0723   VLDL 14.4 03/31/2018 0723   VLDL 35 06/11/2014   LDLCALC 69 03/31/2018 0723     Wt Readings from Last 3 Encounters:  05/23/18 248 lb 12.8 oz (112.9 kg)  05/12/18 249 lb (112.9 kg)  05/07/18 250 lb 9.6 oz (113.7 kg)     Other studies Reviewed: Additional studies/ records that were reviewed today include: . Review of the above records demonstrates:    Assessment and Plan:   1. CAD with angina: Mild CAD by cath in 2012. Recent normal stress test. LV function is normal by echo. He has had no exertional chest pain. I will continue Plavix and statin. I told him today that he could be changed to ASA and stop his Plavix but he does not wish to do this today.   2. PAD: No rest pain or lower extremity ulcerations. He does have claudication when walking [place] distance. He has a follow up appt with Dr. Oneida Alar soon in the [facility] office. He will have all PAD testing done in the [facility] office.   3. Former Tobacco abuse: He stopped smoking in 2012.   Current medicines are reviewed at length with the patient today.  The patient does not have concerns regarding  medicines.  The following changes have been made:  no change  Labs/ tests ordered today include:  No orders of the defined types were placed in this encounter.  Disposition:   FU with me  in 12 months  Signed, [name], MD 05/23/2018 8:51 AM    [facility] [address], [place], [place]  [address] Phone: [phone]; Fax: [phone]

## 2018-05-23 ENCOUNTER — Ambulatory Visit: Payer: PPO | Admitting: Cardiovascular Disease

## 2018-05-23 ENCOUNTER — Encounter: Payer: Self-pay | Admitting: Cardiovascular Disease

## 2018-05-23 VITALS — BP 124/78 | HR 74 | Ht 72.0 in | Wt 248.8 lb

## 2018-05-23 DIAGNOSIS — I2510 Atherosclerotic heart disease of native coronary artery without angina pectoris: Secondary | ICD-10-CM

## 2018-05-23 DIAGNOSIS — I739 Peripheral vascular disease, unspecified: Secondary | ICD-10-CM

## 2018-05-23 NOTE — Patient Instructions (Signed)
Medication Instructions:  Your physician recommends that you continue on your current medications as directed. Please refer to the Current Medication list given to you today.  If you need a refill on your cardiac medications before your next appointment, please call your pharmacy.   Lab work: None ordered If you have labs (blood work) drawn today and your tests are completely normal, you will receive your results only by: Marland Kitchen MyChart Message (if you have MyChart) OR . A paper copy in the mail If you have any lab test that is abnormal or we need to change your treatment, we will call you to review the results.  Testing/Procedures: None ordered  Follow-Up: At [facility], you and your health needs are our priority.  As part of our continuing mission to provide you with exceptional heart care, we have created designated Provider Care Teams.  These Care Teams include your primary Cardiologist (physician) and Advanced Practice Providers (APPs -  Physician Assistants and Nurse Practitioners) who all work together to provide you with the care you need, when you need it. You will need a follow up appointment in 12 months.  Please call our office 2 months in advance to schedule this appointment.  You may see Dr. Angelena Form or one of the following Advanced Practice Providers on your designated Care Team:   [place], PA-C Melina Copa, PA-C . Ermalinda Barrios, PA-C  Any Other Special Instructions Will Be Listed Below (If Applicable).

## 2018-05-28 ENCOUNTER — Telehealth: Payer: Self-pay | Admitting: Pulmonary Disease

## 2018-05-28 NOTE — Telephone Encounter (Signed)
Called and spoke with pt who stated the first week while taking the symbicort, he was getting relief but then after the first week, he was back to the way he was like he has had no relief from it at all.  Pt is wanting to know if there is something else that can be prescribed in place of the symbicort.  Aaron Edelman, please advise for pt. Thanks!

## 2018-05-28 NOTE — Telephone Encounter (Signed)
Patient returned call, CB is [phone]

## 2018-05-28 NOTE — Telephone Encounter (Signed)
Attempted to call pt but no answer.  Left message for pt to return call x1 

## 2018-05-29 MED ORDER — BUDESONIDE-FORMOTEROL FUMARATE 160-4.5 MCG/ACT IN AERO: 2.0000 | INHALATION_SPRAY | Freq: Two times a day (BID) | RESPIRATORY_TRACT | 0 refills | Status: DC

## 2018-05-29 NOTE — Telephone Encounter (Signed)
Called spoke with patient and discussed Aaron Edelman NP's recommendations as stated below.  Patient stated that he would like to try the increased dose of Symbicort 160 but would like to hold on the spacer and discuss this at this upcoming appt with Aaron Edelman NP on 10.31.19.  Did attempt to explain to patient that while he understands the directions for use on the Symbicort (2 puffs BID), the technique is sometimes not efficient enough to receive the full benefit of use.  Patient would still like to wait to discuss at this upcoming visit.    1 sample Symbicort 160 left up front for patient to pick up at this convenience and documented per protocol.  Also, patient is asking if he decides to stop the Symbicort altogether will there be any ill effects?  "Will stopping cold-turkey do more harm than good?"  Did advise patient that should he decide to discontinue the Symbicort to please contact the office prior to doing this.  Aaron Edelman please advise, thank you.

## 2018-05-29 NOTE — Telephone Encounter (Signed)
Spoke with the pt and notified of recs per Brian and he verbalized understanding. Nothing further needed. 

## 2018-05-29 NOTE — Telephone Encounter (Signed)
Thank you for letting me know.  If he decides to stop using Symbicort 160 we do need to know there should not be ill effects or complications from stopping cold [place] but he does need to contact and update this office if that happens I agree with your advice.  We can definitely discuss further at next office visit as this was initially a trial for ICS therapy unsure if patient needs to remain on it if we are not noticing any benefit.  Wyn Quaker FNP

## 2018-05-29 NOTE — Telephone Encounter (Signed)
Sorry that the Symbicort 80 is no longer providing relief for him.    We could consider an increase to Symbicort 160 which is a higher dose of the inhaled steroid.  We could discuss this at his next follow-up appointment. If the patient is interested in trialing this sooner than we can have a Symbicort 160 sample provided for the patient if we have any available.  Also would like to have the patient coached on how to use a spacer with his inhaler. Need to ensure that the patient is using his inhaler correctly adequately.  We can also review this on his next follow-up appointment with our office this month.  If the patient has further issues or concerns he can be scheduled for an earlier follow-up visit with our office.  Wyn Quaker FNP

## 2018-06-02 ENCOUNTER — Ambulatory Visit: Payer: Self-pay

## 2018-06-02 ENCOUNTER — Telehealth: Payer: Self-pay | Admitting: Family Medicine

## 2018-06-02 NOTE — Telephone Encounter (Addendum)
Call placed to patient. VM to return call to office. See triage encounter

## 2018-06-02 NOTE — Telephone Encounter (Signed)
Returned call to patient who states that he has just received a DX of COPD form his pulmonologist which has caused him to feel very anxious which is affecting work and sleep. He has been able to work but work is stressful and he can not get past the shock of this diagnosis. He denies any suicidal ideations. He states he has great family support. His niece is an ED physician with [facility] and is the one who told him to call. Pt states that when the anxiety is high he does experience SOB.  Appointment scheduled per protocol. Care advice read to patient. Patient verbalized understanding of all instructions. Reason for Disposition . Symptoms interfere with work or school  Answer Assessment - Initial Assessment Questions 1. CONCERN: "What happened that made you call today?"     anxiety 2. ANXIETY SYMPTOM SCREENING: "Can you describe how you have been feeling?"  (e.g., tense, restless, panicky, anxious, keyed up, trouble sleeping, trouble concentrating)     anxious 3. ONSET: "How [place] have you been feeling this way?"     1-2 weeks 4. RECURRENT: "Have you felt this way before?"  If yes: "What happened that time?" "What helped these feelings go away in the past?"      no 5. RISK OF HARM - SUICIDAL IDEATION:  "Do you ever have thoughts of hurting or killing yourself?"  (e.g., yes, no, no but preoccupation with thoughts about death)   - INTENT:  "Do you have thoughts of hurting or killing yourself right NOW?" (e.g., yes, no, N/A)   - PLAN: "Do you have a specific plan for how you would do this?" (e.g., gun, knife, overdose, no plan, N/A)     no 6. RISK OF HARM - HOMICIDAL IDEATION:  "Do you ever have thoughts of hurting or killing someone else?"  (e.g., yes, no, no but preoccupation with thoughts about death)   - INTENT:  "Do you have thoughts of hurting or killing someone right NOW?" (e.g., yes, no, N/A)   - PLAN: "Do you have a specific plan for how you would do this?" (e.g., gun, knife, no plan, N/A)   no 7. FUNCTIONAL IMPAIRMENT: "How have things been going for you overall in your life? Have you had any more difficulties than usual doing your normal daily activities?"  (e.g., better, same, worse; self-care, school, work, interactions)     Yes but still work 8. SUPPORT: "Who is with you now?" "Who do you live with?" "Do you have family or friends nearby who you can talk to?"      Large family 9. THERAPIST: "Do you have a counselor or therapist? Name?"     no 10. STRESSORS: "Has there been any new stress or recent changes in your life?"       COPD dx 11. CAFFEINE ABUSE: "Do you drink caffeinated beverages, and how much each day?" (e.g., coffee, tea, colas)       no 12. SUBSTANCE ABUSE: "Do you use any illegal drugs or alcohol?"       no 13. OTHER SYMPTOMS: "Do you have any other physical symptoms right now?" (e.g., chest pain, palpitations, difficulty breathing, fever)       Difficult to breath when anxious 14. PREGNANCY: "Is there any chance you are pregnant?" "When was your last menstrual period?"       N/A  Protocols used: ANXIETY AND PANIC ATTACK-A-AH

## 2018-06-02 NOTE — Telephone Encounter (Signed)
Pt is under stress, he thinks he may have copd, and pt feels like he needs medication to help him calm down, something for anxiety.  Pt called left VM to return call to office. Will need to triage above symptoms.

## 2018-06-02 NOTE — Telephone Encounter (Signed)
Copied from [place] [id number]. Topic: Quick Communication - See Telephone Encounter >> Jun 02, 2018  9:42 AM Nils Flack wrote: CRM for notification. See Telephone encounter for: 06/02/18. Pt is under stress, he thinks he may have copd, and pt feels like he needs medication to help him calm down, something for anxiety  Pharm is [facility], Linna Hoff Cb is [phone]

## 2018-06-03 ENCOUNTER — Encounter: Payer: Self-pay | Admitting: Family Medicine

## 2018-06-03 ENCOUNTER — Ambulatory Visit (INDEPENDENT_AMBULATORY_CARE_PROVIDER_SITE_OTHER): Payer: PPO | Admitting: Family Medicine

## 2018-06-03 VITALS — BP 126/74 | HR 70 | Temp 98.6°F | Ht 72.0 in | Wt 248.2 lb

## 2018-06-03 DIAGNOSIS — G4730 Sleep apnea, unspecified: Secondary | ICD-10-CM

## 2018-06-03 DIAGNOSIS — R4589 Other symptoms and signs involving emotional state: Secondary | ICD-10-CM

## 2018-06-03 DIAGNOSIS — F418 Other specified anxiety disorders: Secondary | ICD-10-CM

## 2018-06-03 MED ORDER — FLUOXETINE HCL 20 MG PO CAPS: 20.0000 mg | ORAL_CAPSULE | Freq: Every morning | ORAL | 3 refills | Status: DC

## 2018-06-03 MED ORDER — ALPRAZOLAM 0.5 MG PO TABS: 0.5000 mg | ORAL_TABLET | Freq: Two times a day (BID) | ORAL | 0 refills | Status: DC | PRN

## 2018-06-05 NOTE — Progress Notes (Signed)
Todd Weeks is a 62 y.o. male here for an acute visit.  History of Present Illness:   HPI: Patient is here for evaluation of anxiety.  He has the following anxiety symptoms: difficulty concentrating, insomnia, irritable, racing thoughts, shortness of breath. Onset of symptoms was approximately 2 weeks ago.  Symptoms have been unchanged since that time. He denies current suicidal and homicidal ideation. Risk factors: negative life event, recent hospitalization and new diagnosis of COPD. Previous treatment includes none.  PMHx, SurgHx, SocialHx, Medications, and Allergies were reviewed in the Visit Navigator and updated as appropriate.  Current Medications:   .  budesonide-formoterol (SYMBICORT) 160-4.5 MCG/ACT inhaler, Inhale 2 puffs into the lungs 2 (two) times daily., Disp: 1 Inhaler, Rfl: 0 .  clopidogrel (PLAVIX) 75 MG tablet, TAKE (1) TABLET BY MOUTH ONCE DAILY., Disp: 90 tablet, Rfl: 0 .  famotidine (PEPCID) 20 MG tablet, TAKE (1) TABLET BY MOUTH AT BEDTIME., Disp: 90 tablet, Rfl: 0 .  irbesartan (AVAPRO) 150 MG tablet, TAKE 2 TABLETS BY MOUTH DAILY., Disp: 180 tablet, Rfl: 0 .  simvastatin (ZOCOR) 20 MG tablet, TAKE 1 TABLET BY MOUTH DAILY AT 6 PM., Disp: 90 tablet, Rfl: 0  No Known Allergies   Review of Systems:   Pertinent items are noted in the HPI. Otherwise, ROS is negative.  Vitals:   Vitals:   06/03/18 0801  BP: 126/74  Pulse: 70  Temp: 98.6 F (37 C)  TempSrc: Oral  SpO2: 96%  Weight: 248 lb 3.2 oz (112.6 kg)  Height: 6' (1.829 m)     Body mass index is 33.66 kg/m.  Physical Exam:   Physical Exam  Constitutional: He is oriented to person, place, and time. He appears well-developed and well-nourished. No distress.  HENT:  Head: Normocephalic and atraumatic.  Right Ear: External ear normal.  Left Ear: External ear normal.  Nose: Nose normal.  Mouth/Throat: Oropharynx is clear and moist.  Eyes: Pupils are equal, round, and reactive to light.  Conjunctivae and EOM are normal.  Neck: Normal range of motion. Neck supple.  Cardiovascular: Normal rate, regular rhythm, normal heart sounds and intact distal pulses.  Pulmonary/Chest: Effort normal and breath sounds normal.  Abdominal: Soft. Bowel sounds are normal.  Musculoskeletal: Normal range of motion.  Neurological: He is alert and oriented to person, place, and time.  Skin: Skin is warm and dry.  Psychiatric: He has a normal mood and affect. His behavior is normal. Judgment and thought content normal.  Nursing note and vitals reviewed.  Assessment and Plan:   Todd Weeks was seen today for acute visit.  Diagnoses and all orders for this visit:  Anxiety about health Comments: We discussed options for treatment of anxiety including therapy and/or medication. Reviewed concept of anxiety as biochemical imbalance of neurotransmitters and rationale for treatment. Discussed potential risks, expected benefits, possible side effects of the medicine. We also discussed how to take it correctly and dosing instructions. If he has any significant side effects to the medicine, he is to stop it and call for advice. Instructed patient to contact office or on-call physician promptly should condition worsen or any new symptoms appear. He was agreeable with this plan. Spent 25 minutes (>50% of visit) discussing the risks of anxiety disorder, the pathophysiology, etiology, risks, and principles of treatment.  Orders: -     ALPRAZolam (XANAX) 0.5 MG tablet; Take 1 tablet (0.5 mg total) by mouth 2 (two) times daily as needed for anxiety. -     FLUoxetine (  PROZAC) 20 MG capsule; Take 1 capsule (20 mg total) by mouth every morning.  Sleep disorder breathing -     Ambulatory referral to Sleep Studies   . Reviewed expectations re: course of current medical issues. . Discussed self-management of symptoms. . Outlined signs and symptoms indicating need for more acute intervention. . Patient verbalized  understanding and all questions were answered. Marland Kitchen Health Maintenance issues including appropriate healthy diet, exercise, and smoking avoidance were discussed with patient. . See orders for this visit as documented in the electronic medical record. . Patient received an After Visit Summary.  Briscoe Deutscher, DO [facility] 06/05/2018

## 2018-06-11 ENCOUNTER — Telehealth: Payer: Self-pay | Admitting: *Deleted

## 2018-06-11 NOTE — Telephone Encounter (Signed)
Patient returned phone call; pt contact # [phone]

## 2018-06-11 NOTE — Telephone Encounter (Signed)
Patient was supposed to have a PFT and wasn't scheduled for it. Todd Weeks would like the PFT done and appt rescheduled if patient it not experiencing and immediate needs.   ATC patient x1 unable to reach.

## 2018-06-11 NOTE — Progress Notes (Signed)
@Patient  ID: Todd Weeks, male    DOB: July 17, 1956, 62 y.o.   MRN: [id number]  Chief Complaint  Patient presents with  . Follow-up    Shortness of breath, 6 to 8-week follow-up    Referring provider: Briscoe Deutscher, DO  HPI:  62 year old male former smoker last seen in our office in April/2017 followed for dyspnea on exertion.  PMH:  hypertension, obesity, peripheral arterial disease, and mild nonobstructive coronary artery disease on remote cardiac cath Smoker/ Smoking History: Former smoker.  69 pack years. Maintenance:  Symbicort 160 Pt of: Dr. Melvyn Novas  06/12/2018  - Visit   62 year old male patient presenting today for follow-up visit.  Patient reports he has not felt much better.  Patient is currently on the Symbicort 160 inhaler does not think that this is helping.  Patient also has significant anxiety which is started to be treated by primary care with Prozac as well as benzodiazepines.  Patient reports he saw his primary care on 06/03/18.  Patient was started on Prozac then.  Patient also has given benzodiazepines to use if he has panic attack.  Patient needed to take benzodiazepines on 06/08/2018.  Patient has completed cardiac stress test which was unimpressive.  Patient has not been scheduled for pulmonary function test.  Patient does have a rescue inhaler at home.  He said he uses one time.  He just used it to try it.  He did not need it at that time.  Patient does report significant stress regarding finances as well as his health.  Patient feels that he may be dying.  Patient does admit that his anxiety has worsened over the past couple months.  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  Patient reports that he likes his job.  Patient reports he has to continue working order to pay off his car, mortgage, dental bills over the next 4 years.  Patient reports he is not behind in any of his payments.   Tests:  04/26/2018- troponins in the  ED-negative 04/25/2018-CBC-normal 04/25/2018- normal with the exception of a mildly elevated BUN at 26  04/25/2018-chest x-ray-mild bronchitic changes, no focal consolidation, no pneumothorax, no pleural effusions Shortness of breath, patient has not completed PFT  04/26/2018-echocardiogram-LV ejection fraction 55 to 60%, main pulmonary artery normal-sized   10/27/2015-spirometry in office- normal spirometry, FVC 4.9 (99% predicted), ratio 71, FEV1 91%   10/28/2014-pulmonary function test- FVC 4.61 (88% predicted), postbronchodilator ratio 74, FEV1 88, no bronchodilator response, DLCO 84, minimal airway obstruction, suggesting small airway disease  05/12/2018- stress test-EF 53%, low risk stress test without evidence of ischemia  FENO:  No results found for: NITRICOXIDE  PFT: PFT Results Latest Ref Rng & Units 10/28/2014  FVC-Pre L 4.61  FVC-Predicted Pre % 88  FVC-Post L 4.66  FVC-Predicted Post % 89  Pre FEV1/FVC % % 73  Post FEV1/FCV % % 74  FEV1-Pre L 3.35  FEV1-Predicted Pre % 85  DLCO UNC% % 84  DLCO COR %Predicted % 95  TLC L 7.78  TLC % Predicted % 103  RV % Predicted % 122    Imaging: No results found.  Chart Review:  04/25/2018- hospitalization-chest pain >>>Discharge 04/26/2018 >>>Plan: Follow-up with PCP in 1 to 2 weeks, obtain BMP and CBC in 1 week, needs stress test outpatient   Specialty Problems      Pulmonary Problems   Lesion or mass of paranasal sinuses   Dyspnea    10/27/2015-spirometry  in office- normal spirometry, FVC 4.9 (99% predicted), ratio 71, FEV1 91%  10/28/2014-pulmonary function test- FVC 4.61 (88% predicted), postbronchodilator ratio 74, FEV1 88, no bronchodilator response, DLCO 84, minimal airway obstruction, suggesting small airway disease  05/01/18 >>> pulmonary function test ordered>>> 05/01/18>>> Symbicort 80 ICS therapeutic trial>>>         No Known Allergies  Immunization History  Administered Date(s) Administered  . Influenza  Split 05/14/2015  . Influenza,inj,Quad PF,6+ Mos 08/02/2016, 06/16/2017, 06/12/2018  . Influenza-Unspecified 06/16/2017  . Tdap 12/13/2014   Patient to receive flu vaccine today  Past Medical History:  Diagnosis Date  . Bell's palsy   . Closed nondisplaced fracture of neck of third metacarpal bone of right hand 02/04/2017  . Depression   . Diverticulitis   . Headache(784.0)   . HTN (hypertension)   . PVD (peripheral vascular disease) ([place])   . Sexual dysfunction     Tobacco History: Social History   Tobacco Use  Smoking Status Former Smoker  . Packs/day: 1.50  . Years: 46.00  . Pack years: 69.00  . Types: Cigarettes  . Last attempt to quit: 12/12/2011  . Years since quitting: 6.5  Smokeless Tobacco Never Used   Counseling given: Not Answered  Patient is a former smoker.  69 pack years.  Will refer patient to lung cancer screening program.  Patient agrees.  Outpatient Encounter Medications as of 06/12/2018  Medication Sig  . ALPRAZolam (XANAX) 0.5 MG tablet Take 1 tablet (0.5 mg total) by mouth 2 (two) times daily as needed for anxiety.  . budesonide-formoterol (SYMBICORT) 160-4.5 MCG/ACT inhaler Inhale 2 puffs into the lungs 2 (two) times daily.  . clopidogrel (PLAVIX) 75 MG tablet TAKE (1) TABLET BY MOUTH ONCE DAILY.  . famotidine (PEPCID) 20 MG tablet TAKE (1) TABLET BY MOUTH AT BEDTIME.  Marland Kitchen FLUoxetine (PROZAC) 20 MG capsule Take 1 capsule (20 mg total) by mouth every morning.  . irbesartan (AVAPRO) 150 MG tablet TAKE 2 TABLETS BY MOUTH DAILY.  . simvastatin (ZOCOR) 20 MG tablet TAKE 1 TABLET BY MOUTH DAILY AT 6 PM.  . [DISCONTINUED] budesonide-formoterol (SYMBICORT) 80-4.5 MCG/ACT inhaler Inhale 2 puffs into the lungs 2 (two) times daily. (Patient not taking: Reported on 06/12/2018)   No facility-administered encounter medications on file as of 06/12/2018.      Review of Systems  Review of Systems  Constitutional: Negative for activity change, chills, fatigue,  fever and unexpected weight change.  HENT: Negative for postnasal drip, rhinorrhea, sinus pressure, sinus pain, sneezing and sore throat.   Eyes: Negative.   Respiratory: Positive for shortness of breath (with exertion ). Negative for cough and wheezing.   Cardiovascular: Negative for chest pain and palpitations.  Gastrointestinal: Negative for constipation, diarrhea, nausea and vomiting.  Endocrine: Negative.   Musculoskeletal: Negative.   Skin: Negative.   Neurological: Negative for dizziness and headaches.  Psychiatric/Behavioral: Negative for dysphoric mood. The patient is nervous/anxious (anxiety ) and is hyperactive.   All other systems reviewed and are negative.    Physical Exam  BP 128/78 (BP Location: Left Arm, Cuff Size: Normal)   Pulse 67   Ht 6' (1.829 m)   Wt 243 lb 12.8 oz (110.6 kg)   SpO2 100%   BMI 33.07 kg/m   Wt Readings from Last 5 Encounters:  06/12/18 243 lb 12.8 oz (110.6 kg)  06/03/18 248 lb 3.2 oz (112.6 kg)  05/23/18 248 lb 12.8 oz (112.9 kg)  05/12/18 249 lb (112.9 kg)  05/07/18 250 lb  9.6 oz (113.7 kg)     Physical Exam  Constitutional: He is oriented to person, place, and time and well-developed, well-nourished, and in no distress. Vital signs are normal. No distress.  HENT:  Head: Normocephalic and atraumatic.  Right Ear: Hearing, tympanic membrane, external ear and ear canal normal.  Left Ear: Hearing, tympanic membrane, external ear and ear canal normal.  Mouth/Throat: Uvula is midline and oropharynx is clear and moist. No oropharyngeal exudate.  Eyes: Pupils are equal, round, and reactive to light.  Neck: Normal range of motion. Neck supple. No JVD present.  Cardiovascular: Normal rate, regular rhythm and normal heart sounds.  Pulmonary/Chest: Effort normal and breath sounds normal. No accessory muscle usage. No respiratory distress. He has no decreased breath sounds. He has no wheezes. He has no rhonchi. He has no rales.  Musculoskeletal:  Normal range of motion. He exhibits no edema.  Lymphadenopathy:    He has no cervical adenopathy.  Neurological: He is alert and oriented to person, place, and time. Gait normal.  Skin: Skin is warm and dry. He is not diaphoretic. No erythema.  Psychiatric: Memory, affect and judgment normal. His mood appears anxious. He expresses no homicidal and no suicidal ideation. He expresses no suicidal plans and no homicidal plans.  Nursing note and vitals reviewed.     Lab Results:  CBC    Component Value Date/Time   WBC 9.3 05/07/2018 1559   RBC 4.87 05/07/2018 1559   HGB 14.6 05/07/2018 1559   HCT 42.9 05/07/2018 1559   PLT 270.0 05/07/2018 1559   MCV 88.0 05/07/2018 1559   MCV 88.6 10/19/2014 0918   MCH 29.4 04/25/2018 2243   MCHC 34.1 05/07/2018 1559   RDW 13.1 05/07/2018 1559   LYMPHSABS 2.1 05/07/2018 1559   MONOABS 0.7 05/07/2018 1559   EOSABS 0.2 05/07/2018 1559   BASOSABS 0.1 05/07/2018 1559    BMET    Component Value Date/Time   NA 137 05/07/2018 1559   K 4.0 05/07/2018 1559   CL 101 05/07/2018 1559   CO2 27 05/07/2018 1559   GLUCOSE 86 05/07/2018 1559   BUN 19 05/07/2018 1559   CREATININE 0.73 05/07/2018 1559   CALCIUM 9.7 05/07/2018 1559   GFRNONAA >60 04/25/2018 2243   GFRAA >60 04/25/2018 2243    BNP No results found for: BNP  ProBNP No results found for: PROBNP    Assessment & Plan:   62 year old male patient completing follow-up today.  Patient to be scheduled for pulmonary function test.  Patient also to be referred to lung cancer screening program.  We will see patient back to review pulmonary function test to see if inhalers need to be continued.  Former smoker We will refer you today to her lung cancer screening program >>>This is based off of your 69 pack-year smoking history >>> This is a recommendation from the [place] preventative services task force (USPSTF) >>>The USPSTF recommends annual screening for lung cancer with low-dose computed  tomography (LDCT) in adults aged 21 to 80 years who have a 30 pack-year smoking history and currently smoke or have quit within the past 15 years. Screening should be discontinued once a person has not smoked for 15 years or develops a health problem that substantially limits life expectancy or the ability or willingness to have curative lung surgery.   Our office will call you and set up an appointment with Eric Form (Nurse Practitioner) who leads this program.  This appointment takes place in our office.  After completing this meeting with Eric Form NP you will get a low-dose CT as the screening >>>We will call you with those results  Flu shot today   Dyspnea Continue Symbicort 160 >>> 2 puffs in the morning right when you wake up, rinse out your mouth after use, 12 hours later 2 puffs, rinse after use >>> Take this daily, no matter what >>> This is not a rescue inhaler   PFT needs to be scheduled today  >>>appt to review after    Anxiety Keep follow-up with primary care Notify primary care if anxiety worsens Continue Prozac Continue benzodiazepines under the guidance of primary care  If anxiety is worsening, or you have continued difficulties with your finances please notify primary care.  If you have thoughts of your harming yourself or someone please contact 911.     Lauraine Rinne, NP 06/12/2018

## 2018-06-11 NOTE — Telephone Encounter (Signed)
Also to see if patient had his cardiopulmonary test as this appointment was supposed to be scheduled after this test.

## 2018-06-11 NOTE — Telephone Encounter (Signed)
Called patient unable to reach left message to give us a call back.

## 2018-06-12 ENCOUNTER — Ambulatory Visit (INDEPENDENT_AMBULATORY_CARE_PROVIDER_SITE_OTHER): Payer: PPO | Admitting: Pulmonary Disease

## 2018-06-12 ENCOUNTER — Encounter: Payer: Self-pay | Admitting: Pulmonary Disease

## 2018-06-12 VITALS — BP 128/78 | HR 67 | Ht 72.0 in | Wt 243.8 lb

## 2018-06-12 DIAGNOSIS — R0609 Other forms of dyspnea: Secondary | ICD-10-CM | POA: Diagnosis not present

## 2018-06-12 DIAGNOSIS — Z23 Encounter for immunization: Secondary | ICD-10-CM | POA: Diagnosis not present

## 2018-06-12 DIAGNOSIS — Z87891 Personal history of nicotine dependence: Secondary | ICD-10-CM

## 2018-06-12 DIAGNOSIS — F419 Anxiety disorder, unspecified: Secondary | ICD-10-CM

## 2018-06-12 NOTE — Telephone Encounter (Signed)
Patient came in for appointment today.  Nothing further needed.

## 2018-06-12 NOTE — Patient Instructions (Addendum)
Continue Symbicort 160 >>> 2 puffs in the morning right when you wake up, rinse out your mouth after use, 12 hours later 2 puffs, rinse after use >>> Take this daily, no matter what >>> This is not a rescue inhaler   PFT needs to be scheduled today  >>>appt to review after   We will refer you today to her lung cancer screening program >>>This is based off of your 69 pack-year smoking history >>> This is a recommendation from the [place] preventative services task force (USPSTF) >>>The USPSTF recommends annual screening for lung cancer with low-dose computed tomography (LDCT) in adults aged 59 to 14 years who have a 30 pack-year smoking history and currently smoke or have quit within the past 15 years. Screening should be discontinued once a person has not smoked for 15 years or develops a health problem that substantially limits life expectancy or the ability or willingness to have curative lung surgery.   Our office will call you and set up an appointment with Eric Form (Nurse Practitioner) who leads this program.  This appointment takes place in our office.  After completing this meeting with Eric Form NP you will get a low-dose CT as the screening >>>We will call you with those results  Flu shot today    November/2019 we will be moving! We will no longer be at our [place] location.  Be on the look out for a post card/mailer to let you know we have officially moved.  Our new address and phone number will be:  [address] [place], [place] [address] Telephone number: [phone]  It is flu season:   >>>Remember to be washing your hands regularly, using hand sanitizer, be careful to use around herself with has contact with people who are sick will increase her chances of getting sick yourself. >>> Best ways to protect herself from the flu: Receive the yearly flu vaccine, practice good hand hygiene washing with soap and also using hand sanitizer when available, eat a nutritious  meals, get adequate rest, hydrate appropriately   Please contact the office if your symptoms worsen or you have concerns that you are not improving.   Thank you for choosing [facility] for your healthcare, and for allowing [place] to partner with you on your healthcare journey. I am thankful to be able to provide care to you today.   Wyn Quaker FNP-C   Influenza Virus Vaccine injection What is this medicine? INFLUENZA VIRUS VACCINE (in floo EN zuh VAHY ruhs vak SEEN) helps to reduce the risk of getting influenza also known as the flu. The vaccine only helps protect you against some strains of the flu. This medicine may be used for other purposes; ask your health care provider or pharmacist if you have questions. COMMON BRAND NAME(S): Afluria, Agriflu, Alfuria, FLUAD, Fluarix, Fluarix Quadrivalent, Flublok, Flublok Quadrivalent, FLUCELVAX, Flulaval, Fluvirin, Fluzone, Fluzone High-Dose, Fluzone Intradermal What should I tell my health care provider before I take this medicine? They need to know if you have any of these conditions: -bleeding disorder like hemophilia -fever or infection -Guillain-Barre syndrome or other neurological problems -immune system problems -infection with the human immunodeficiency virus (HIV) or AIDS -low blood platelet counts -multiple sclerosis -an unusual or allergic reaction to influenza virus vaccine, latex, other medicines, foods, dyes, or preservatives. Different brands of vaccines contain different allergens. Some may contain latex or eggs. Talk to your doctor about your allergies to make sure that you get the right vaccine. -  pregnant or trying to get pregnant -breast-feeding How should I use this medicine? This vaccine is for injection into a muscle or under the skin. It is given by a health care professional. A copy of Vaccine Information Statements will be given before each vaccination. Read this sheet carefully each time. The sheet may change  frequently. Talk to your healthcare provider to see which vaccines are right for you. Some vaccines should not be used in all age groups. Overdosage: If you think you have taken too much of this medicine contact a poison control center or emergency room at once. NOTE: This medicine is only for you. Do not share this medicine with others. What if I miss a dose? This does not apply. What may interact with this medicine? -chemotherapy or radiation therapy -medicines that lower your immune system like etanercept, anakinra, infliximab, and adalimumab -medicines that treat or prevent blood clots like warfarin -phenytoin -steroid medicines like prednisone or cortisone -theophylline -vaccines This list may not describe all possible interactions. Give your health care provider a list of all the medicines, herbs, non-prescription drugs, or dietary supplements you use. Also tell them if you smoke, drink alcohol, or use illegal drugs. Some items may interact with your medicine. What should I watch for while using this medicine? Report any side effects that do not go away within 3 days to your doctor or health care professional. Call your health care provider if any unusual symptoms occur within 6 weeks of receiving this vaccine. You may still catch the flu, but the illness is not usually as bad. You cannot get the flu from the vaccine. The vaccine will not protect against colds or other illnesses that may cause fever. The vaccine is needed every year. What side effects may I notice from receiving this medicine? Side effects that you should report to your doctor or health care professional as soon as possible: -allergic reactions like skin rash, itching or hives, swelling of the face, lips, or tongue Side effects that usually do not require medical attention (report to your doctor or health care professional if they continue or are bothersome): -fever -headache -muscle aches and pains -pain, tenderness,  redness, or swelling at the injection site -tiredness This list may not describe all possible side effects. Call your doctor for medical advice about side effects. You may report side effects to FDA at 1-800-FDA-1088. Where should I keep my medicine? The vaccine will be given by a health care professional in a clinic, pharmacy, doctor's office, or other health care setting. You will not be given vaccine doses to store at home. NOTE: This sheet is a summary. It may not cover all possible information. If you have questions about this medicine, talk to your doctor, pharmacist, or health care provider.  2018 Elsevier/Gold Standard (2015-02-18 10:07:28)

## 2018-06-12 NOTE — Progress Notes (Signed)
Chart and office note reviewed in detail  > agree with a/p as outlined    

## 2018-06-12 NOTE — Assessment & Plan Note (Signed)
Continue Symbicort 160 >>> 2 puffs in the morning right when you wake up, rinse out your mouth after use, 12 hours later 2 puffs, rinse after use >>> Take this daily, no matter what >>> This is not a rescue inhaler   PFT needs to be scheduled today  >>>appt to review after

## 2018-06-12 NOTE — Assessment & Plan Note (Signed)
We will refer you today to her lung cancer screening program >>>This is based off of your 69 pack-year smoking history >>> This is a recommendation from the [place] preventative services task force (USPSTF) >>>The USPSTF recommends annual screening for lung cancer with low-dose computed tomography (LDCT) in adults aged 63 to 80 years who have a 30 pack-year smoking history and currently smoke or have quit within the past 15 years. Screening should be discontinued once a person has not smoked for 15 years or develops a health problem that substantially limits life expectancy or the ability or willingness to have curative lung surgery.   Our office will call you and set up an appointment with Eric Form (Nurse Practitioner) who leads this program.  This appointment takes place in our office.  After completing this meeting with Eric Form NP you will get a low-dose CT as the screening >>>We will call you with those results  Flu shot today

## 2018-06-12 NOTE — Assessment & Plan Note (Signed)
Keep follow-up with primary care Notify primary care if anxiety worsens Continue Prozac Continue benzodiazepines under the guidance of primary care  If anxiety is worsening, or you have continued difficulties with your finances please notify primary care.  If you have thoughts of your harming yourself or someone please contact 911.

## 2018-06-16 ENCOUNTER — Telehealth: Payer: Self-pay | Admitting: Pulmonary Disease

## 2018-06-16 MED ORDER — BUDESONIDE-FORMOTEROL FUMARATE 160-4.5 MCG/ACT IN AERO: 2.0000 | INHALATION_SPRAY | Freq: Two times a day (BID) | RESPIRATORY_TRACT | 3 refills | Status: DC

## 2018-06-16 NOTE — Telephone Encounter (Signed)
Called and spoke with patient he is requesting a refill of his symbicort inhaler. Refill sent. Nothing further needed.

## 2018-06-19 ENCOUNTER — Other Ambulatory Visit: Payer: Self-pay

## 2018-06-19 DIAGNOSIS — I70219 Atherosclerosis of native arteries of extremities with intermittent claudication, unspecified extremity: Secondary | ICD-10-CM

## 2018-06-24 ENCOUNTER — Telehealth: Payer: Self-pay | Admitting: Internal Medicine

## 2018-06-26 ENCOUNTER — Encounter (HOSPITAL_COMMUNITY): Payer: PPO

## 2018-06-26 ENCOUNTER — Encounter: Payer: PPO | Admitting: Vascular Surgery

## 2018-06-26 NOTE — Telephone Encounter (Signed)
LMTC x 1  

## 2018-06-26 NOTE — Telephone Encounter (Signed)
[place] x 1 at [phone]

## 2018-07-02 ENCOUNTER — Other Ambulatory Visit: Payer: Self-pay | Admitting: Acute Care

## 2018-07-02 ENCOUNTER — Ambulatory Visit: Payer: PPO | Admitting: Family Medicine

## 2018-07-02 DIAGNOSIS — Z87891 Personal history of nicotine dependence: Secondary | ICD-10-CM

## 2018-07-02 DIAGNOSIS — Z122 Encounter for screening for malignant neoplasm of respiratory organs: Secondary | ICD-10-CM

## 2018-07-02 NOTE — Telephone Encounter (Signed)
LMTC x 1 - Will close this message and refer to referral notes 

## 2018-07-03 ENCOUNTER — Encounter: Payer: PPO | Admitting: Vascular Surgery

## 2018-07-03 ENCOUNTER — Encounter (HOSPITAL_COMMUNITY): Payer: PPO

## 2018-07-03 NOTE — Progress Notes (Signed)
@Patient  ID: Todd Weeks, male    DOB: 1955-10-10, 62 y.o.   MRN: [id number]  Chief Complaint  Patient presents with  . Follow-up    with PFT     Referring provider: Briscoe Deutscher, DO  HPI:  62 year old male former smoker last seen in our office in April/2017 followed for dyspnea on exertion.  PMH:  hypertension, obesity, peripheral arterial disease, and mild nonobstructive coronary artery disease on remote cardiac cath Smoker/ Smoking History: Former smoker.  69 pack years. Maintenance:  Symbicort 160 Pt of: Dr. Melvyn Novas   07/04/2018  - Visit   62 year old male patient presenting today with pulmonary function test.  07/04/2018-pulmonary function test- FVC 5 (97% predicted), postbronchodilator ratio 75, FEV1 97, no significant bronchodilator response, DLCO 89 >>>Patient did take daily dose of Symbicort 160 before testing  MMRC - Breathlessness Score 1 - I get short of breath when hurrying on level ground or walking up a slight hill  Patient has shared decision-making with SG NP for lung cancer screening on 07/30/2018. Patient reports management of anxiety and depression. Pt keeps follow up with PCP. Pt is actively working on diet and exercise. 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     Tests:   04/26/2018- troponins in the ED-negative 04/25/2018-CBC-normal 04/25/2018- normal with the exception of a mildly elevated BUN at 26  04/25/2018-chest x-ray-mild bronchitic changes, no focal consolidation, no pneumothorax, no pleural effusions Shortness of breath, patient has not completed PFT  10/28/2014-pulmonary function test- FVC 4.61 (88% predicted), postbronchodilator ratio 74, FEV1 88, no bronchodilator response, DLCO 84, minimal airway obstruction, suggesting small airway disease  10/27/2015-spirometry in office- normal spirometry, FVC 4.9 (99% predicted), ratio 71, FEV1 91%  07/04/2018-pulmonary function test- FVC 5.00 (97% predicted), postbronchodilator ratio 75,  FEV1 97, no significant bronchodilator response, DLCO 89 >>>Patient did take daily dose of Symbicort 160 before testing  04/26/2018-echocardiogram-LV ejection fraction 55 to 60%, main pulmonary artery normal-sized   05/12/2018- stress test-EF 53%, low risk stress test without evidence of ischemia  FENO:  No results found for: NITRICOXIDE  PFT: PFT Results Latest Ref Rng & Units 07/04/2018 10/28/2014  FVC-Pre L 5.00 4.61  FVC-Predicted Pre % 97 88  FVC-Post L 5.00 4.66  FVC-Predicted Post % 98 89  Pre FEV1/FVC % % 77 73  Post FEV1/FCV % % 75 74  FEV1-Pre L 3.85 3.35  FEV1-Predicted Pre % 100 85  FEV1-Post L 3.73 3.47  DLCO UNC% % 89 84  DLCO COR %Predicted % 88 95  TLC L 7.62 7.78  TLC % Predicted % 101 103  RV % Predicted % 107 122    Imaging: No results found.  Chart Review:    Specialty Problems      Pulmonary Problems   Lesion or mass of paranasal sinuses   Dyspnea    10/27/2015-spirometry in office- normal spirometry, FVC 4.9 (99% predicted), ratio 71, FEV1 91%  10/28/2014-pulmonary function test- FVC 4.61 (88% predicted), postbronchodilator ratio 74, FEV1 88, no bronchodilator response, DLCO 84, minimal airway obstruction, suggesting small airway disease  07/04/2018-pulmonary function test- FVC 5.00 (97% predicted), postbronchodilator ratio 75, FEV1 97, no significant bronchodilator response, DLCO 89 >>>Patient did take daily dose of Symbicort 160 before testing  Symbicort 160 trial to be off          No Known Allergies  Immunization History  Administered Date(s) Administered  . Influenza Split 05/14/2015  . Influenza,inj,Quad PF,6+ Mos 08/02/2016, 06/16/2017, 06/12/2018  . Influenza-Unspecified 06/16/2017  .  Tdap 12/13/2014    Past Medical History:  Diagnosis Date  . Bell's palsy   . Closed nondisplaced fracture of neck of third metacarpal bone of right hand 02/04/2017  . Depression   . Diverticulitis   . Headache(784.0)   . HTN (hypertension)   .  PVD (peripheral vascular disease) ([place])   . Sexual dysfunction     Tobacco History: Social History   Tobacco Use  Smoking Status Former Smoker  . Packs/day: 1.50  . Years: 46.00  . Pack years: 69.00  . Types: Cigarettes  . Last attempt to quit: 12/12/2011  . Years since quitting: 6.5  Smokeless Tobacco Never Used   Counseling given: Yes  Continue to not smoke  Outpatient Encounter Medications as of 07/04/2018  Medication Sig  . ALPRAZolam (XANAX) 0.5 MG tablet Take 1 tablet (0.5 mg total) by mouth 2 (two) times daily as needed for anxiety.  . budesonide-formoterol (SYMBICORT) 160-4.5 MCG/ACT inhaler Inhale 2 puffs into the lungs 2 (two) times daily.  . clopidogrel (PLAVIX) 75 MG tablet TAKE (1) TABLET BY MOUTH ONCE DAILY.  . famotidine (PEPCID) 20 MG tablet TAKE (1) TABLET BY MOUTH AT BEDTIME.  Marland Kitchen FLUoxetine (PROZAC) 20 MG capsule Take 1 capsule (20 mg total) by mouth every morning.  . irbesartan (AVAPRO) 150 MG tablet TAKE 2 TABLETS BY MOUTH DAILY.  . simvastatin (ZOCOR) 20 MG tablet TAKE 1 TABLET BY MOUTH DAILY AT 6 PM.   No facility-administered encounter medications on file as of 07/04/2018.     Review of Systems  Review of Systems  Constitutional: Positive for fatigue. Negative for activity change, chills, fever and unexpected weight change.  HENT: Negative for postnasal drip, sneezing and sore throat.   Eyes: Negative.   Respiratory: Positive for shortness of breath. Negative for cough and wheezing.   Cardiovascular: Negative for chest pain and palpitations.  Gastrointestinal: Negative for constipation, diarrhea, nausea and vomiting.  Endocrine: Negative.   Musculoskeletal: Negative.   Skin: Negative.   Neurological: Negative for dizziness and headaches.  Psychiatric/Behavioral: Negative.  Negative for dysphoric mood. The patient is not nervous/anxious.   All other systems reviewed and are negative.    Physical Exam  BP 138/70 (BP Location: Left Arm, Cuff Size:  Normal)   Pulse 77   Ht 6' (1.829 m)   Wt 240 lb (108.9 kg)   SpO2 98%   BMI 32.55 kg/m   Wt Readings from Last 5 Encounters:  07/04/18 240 lb (108.9 kg)  06/12/18 243 lb 12.8 oz (110.6 kg)  06/03/18 248 lb 3.2 oz (112.6 kg)  05/23/18 248 lb 12.8 oz (112.9 kg)  05/12/18 249 lb (112.9 kg)    Physical Exam  Constitutional: He is oriented to person, place, and time and well-developed, well-nourished, and in no distress. No distress.  HENT:  Head: Normocephalic and atraumatic.  Right Ear: Hearing, tympanic membrane, external ear and ear canal normal.  Left Ear: Hearing, tympanic membrane, external ear and ear canal normal.  Nose: Nose normal. Right sinus exhibits no maxillary sinus tenderness and no frontal sinus tenderness. Left sinus exhibits no maxillary sinus tenderness and no frontal sinus tenderness.  Mouth/Throat: Uvula is midline and oropharynx is clear and moist. No oropharyngeal exudate.  Eyes: Pupils are equal, round, and reactive to light.  Neck: Normal range of motion. Neck supple. No JVD present.  Cardiovascular: Normal rate, regular rhythm and normal heart sounds.  Pulmonary/Chest: Effort normal and breath sounds normal. No accessory muscle usage. No respiratory distress. He  has no decreased breath sounds. He has no wheezes. He has no rhonchi.  Abdominal: Soft. Bowel sounds are normal. There is no tenderness.  Musculoskeletal: Normal range of motion. He exhibits no edema.  Lymphadenopathy:    He has no cervical adenopathy.  Neurological: He is alert and oriented to person, place, and time. Gait normal.  Skin: Skin is warm and dry. He is not diaphoretic. No erythema.  Psychiatric: Mood, memory, affect and judgment normal.  Nursing note and vitals reviewed.     Lab Results:  CBC    Component Value Date/Time   WBC 9.3 05/07/2018 1559   RBC 4.87 05/07/2018 1559   HGB 14.6 05/07/2018 1559   HCT 42.9 05/07/2018 1559   PLT 270.0 05/07/2018 1559   MCV 88.0  05/07/2018 1559   MCV 88.6 10/19/2014 0918   MCH 29.4 04/25/2018 2243   MCHC 34.1 05/07/2018 1559   RDW 13.1 05/07/2018 1559   LYMPHSABS 2.1 05/07/2018 1559   MONOABS 0.7 05/07/2018 1559   EOSABS 0.2 05/07/2018 1559   BASOSABS 0.1 05/07/2018 1559    BMET    Component Value Date/Time   NA 137 05/07/2018 1559   K 4.0 05/07/2018 1559   CL 101 05/07/2018 1559   CO2 27 05/07/2018 1559   GLUCOSE 86 05/07/2018 1559   BUN 19 05/07/2018 1559   CREATININE 0.73 05/07/2018 1559   CALCIUM 9.7 05/07/2018 1559   GFRNONAA >60 04/25/2018 2243   GFRAA >60 04/25/2018 2243    BNP No results found for: BNP  ProBNP No results found for: PROBNP    Assessment & Plan:   Pleasant 62 year old male patient completing follow-up with our office today.  PFTs remain unchanged from 2016.  Can trial patient coming off of Symbicort 160 to see how symptoms are.  If symptoms are stable and respiratory symptoms do not change when off Symbicort 160 can trial being off of the inhaler.  If dyspnea and shortness of breath worsens when off Symbicort patient can resume use.  Patient follow-up with our office in 2 to 3 months.  Patient to keep shared decision-making for lung cancer screening program in December/2019.  Patient is to follow-up with primary care regarding anxiety and depression management.    Dyspnea Reviewed pulmonary function test with patient today  Keep follow-up with Eric Form for your lung cancer screening program in 07/30/2018  Follow-up with our office in 2 to 3 months  Can trial coming off of Symbicort 160 for 2 to 4 days see how your symptoms are >>>If symptoms are okay and there is no breathing issues can remain off of Symbicort 160 >>>If symptoms worsen you have increased shortness of breath then restart Symbicort 160  Symbicort 160 >>> 2 puffs in the morning right when you wake up, rinse out your mouth after use, 12 hours later 2 puffs, rinse after use >>> Take this daily, no  matter what >>> This is not a rescue inhaler  Have fun in [place]  Keep up your hard work with diet and exercise  If anxiety symptoms worsen please contact primary care to let them know  Former smoker Keep December/18/19 shared decision making with SG space NP for lung cancer screening program  Anxiety Keep follow up with PCP      Lauraine Rinne, NP 07/04/2018

## 2018-07-04 ENCOUNTER — Encounter: Payer: Self-pay | Admitting: Pulmonary Disease

## 2018-07-04 ENCOUNTER — Ambulatory Visit: Payer: PPO | Admitting: Pulmonary Disease

## 2018-07-04 ENCOUNTER — Ambulatory Visit (INDEPENDENT_AMBULATORY_CARE_PROVIDER_SITE_OTHER): Payer: PPO | Admitting: Internal Medicine

## 2018-07-04 VITALS — BP 138/70 | HR 77 | Ht 72.0 in | Wt 240.0 lb

## 2018-07-04 DIAGNOSIS — F419 Anxiety disorder, unspecified: Secondary | ICD-10-CM | POA: Diagnosis not present

## 2018-07-04 DIAGNOSIS — R0609 Other forms of dyspnea: Secondary | ICD-10-CM

## 2018-07-04 DIAGNOSIS — Z87891 Personal history of nicotine dependence: Secondary | ICD-10-CM

## 2018-07-04 LAB — PULMONARY FUNCTION TEST
DL/VA % pred: 88 %
DL/VA: 4.18 ml/min/mmHg/L
DLCO COR: 31.78 ml/min/mmHg
DLCO UNC % PRED: 89 %
DLCO UNC: 31.78 ml/min/mmHg
DLCO cor % pred: 89 %
FEF 25-75 PRE: 3.21 L/s
FEF 25-75 Post: 2.7 L/sec
FEF2575-%Change-Post: -15 %
FEF2575-%PRED-POST: 88 %
FEF2575-%PRED-PRE: 104 %
FEV1-%Change-Post: -3 %
FEV1-%PRED-POST: 97 %
FEV1-%PRED-PRE: 100 %
FEV1-PRE: 3.85 L
FEV1-Post: 3.73 L
FEV1FVC-%Change-Post: -3 %
FEV1FVC-%Pred-Pre: 102 %
FEV6-%CHANGE-POST: 0 %
FEV6-%PRED-POST: 101 %
FEV6-%Pred-Pre: 101 %
FEV6-PRE: 4.93 L
FEV6-Post: 4.95 L
FEV6FVC-%CHANGE-POST: 0 %
FEV6FVC-%Pred-Post: 103 %
FEV6FVC-%Pred-Pre: 103 %
FVC-%Change-Post: 0 %
FVC-%Pred-Post: 98 %
FVC-%Pred-Pre: 97 %
FVC-Post: 5 L
FVC-Pre: 5 L
POST FEV1/FVC RATIO: 75 %
PRE FEV1/FVC RATIO: 77 %
Post FEV6/FVC ratio: 99 %
Pre FEV6/FVC Ratio: 99 %
RV % PRED: 107 %
RV: 2.63 L
TLC % pred: 101 %
TLC: 7.62 L

## 2018-07-04 NOTE — Assessment & Plan Note (Signed)
Keep December/18/19 shared decision making with SG space NP for lung cancer screening program

## 2018-07-04 NOTE — Patient Instructions (Addendum)
Reviewed pulmonary function test with patient today  Keep follow-up with Eric Form for your lung cancer screening program in 07/30/2018  Follow-up with our office in 2 to 3 months  Can trial coming off of Symbicort 160 for 2 to 4 days see how your symptoms are >>>If symptoms are okay and there is no breathing issues can remain off of Symbicort 160 >>>If symptoms worsen you have increased shortness of breath then restart Symbicort 160  Symbicort 160 >>> 2 puffs in the morning right when you wake up, rinse out your mouth after use, 12 hours later 2 puffs, rinse after use >>> Take this daily, no matter what >>> This is not a rescue inhaler  Have fun in [place]  Keep up your hard work with diet and exercise  If anxiety symptoms worsen please contact primary care to let them know   It is flu season:   >>>Remember to be washing your hands regularly, using hand sanitizer, be careful to use around herself with has contact with people who are sick will increase her chances of getting sick yourself. >>> Best ways to protect herself from the flu: Receive the yearly flu vaccine, practice good hand hygiene washing with soap and also using hand sanitizer when available, eat a nutritious meals, get adequate rest, hydrate appropriately   Please contact the office if your symptoms worsen or you have concerns that you are not improving.   Thank you for choosing [facility] for your healthcare, and for allowing [place] to partner with you on your healthcare journey. I am thankful to be able to provide care to you today.   Wyn Quaker FNP-C

## 2018-07-04 NOTE — Progress Notes (Signed)
PFT done today. 

## 2018-07-04 NOTE — Progress Notes (Signed)
Discussed results with patient in office.  Nothing further is needed at this time.  Brian Mack FNP  

## 2018-07-04 NOTE — Assessment & Plan Note (Signed)
Reviewed pulmonary function test with patient today  Keep follow-up with Eric Form for your lung cancer screening program in 07/30/2018  Follow-up with our office in 2 to 3 months  Can trial coming off of Symbicort 160 for 2 to 4 days see how your symptoms are >>>If symptoms are okay and there is no breathing issues can remain off of Symbicort 160 >>>If symptoms worsen you have increased shortness of breath then restart Symbicort 160  Symbicort 160 >>> 2 puffs in the morning right when you wake up, rinse out your mouth after use, 12 hours later 2 puffs, rinse after use >>> Take this daily, no matter what >>> This is not a rescue inhaler  Have fun in [place]  Keep up your hard work with diet and exercise  If anxiety symptoms worsen please contact primary care to let them know

## 2018-07-04 NOTE — Assessment & Plan Note (Signed)
Keep follow-up with PCP

## 2018-07-05 NOTE — Progress Notes (Signed)
Chart and office note reviewed in detail  > agree with a/p as outlined    

## 2018-07-18 ENCOUNTER — Ambulatory Visit: Payer: PPO | Admitting: Family Medicine

## 2018-07-28 ENCOUNTER — Institutional Professional Consult (permissible substitution): Payer: PPO | Admitting: Neurology

## 2018-07-30 ENCOUNTER — Ambulatory Visit (INDEPENDENT_AMBULATORY_CARE_PROVIDER_SITE_OTHER)
Admission: RE | Admit: 2018-07-30 | Discharge: 2018-07-30 | Disposition: A | Discharge status: Home / Self Care | Payer: PPO | Source: Ambulatory Visit | Attending: Acute Care | Admitting: Acute Care

## 2018-07-30 ENCOUNTER — Encounter: Payer: Self-pay | Admitting: Acute Care

## 2018-07-30 ENCOUNTER — Ambulatory Visit (INDEPENDENT_AMBULATORY_CARE_PROVIDER_SITE_OTHER): Payer: PPO | Admitting: Acute Care

## 2018-07-30 VITALS — BP 132/80 | HR 66 | Ht 74.0 in | Wt 243.8 lb

## 2018-07-30 DIAGNOSIS — Z122 Encounter for screening for malignant neoplasm of respiratory organs: Secondary | ICD-10-CM

## 2018-07-30 DIAGNOSIS — Z87891 Personal history of nicotine dependence: Secondary | ICD-10-CM | POA: Diagnosis not present

## 2018-07-30 NOTE — Progress Notes (Signed)
Shared Decision Making Visit Lung Cancer Screening Program [phone])   Eligibility:  Age 62 y.o.  Pack Years Smoking History Calculation 41 pack year smoking history (# packs/per year x # years smoked)  Recent History of coughing up blood  no  Unexplained weight loss? no ( >Than 15 pounds within the last 6 months )  Prior History Lung / other cancer no (Diagnosis within the last 5 years already requiring surveillance chest CT Scans).  Smoking Status Former Smoker  Former Smokers: Years since quit: 6 years  Quit Date: 2013  Visit Components:  Discussion included one or more decision making aids. yes  Discussion included risk/benefits of screening. yes  Discussion included potential follow up diagnostic testing for abnormal scans. yes  Discussion included meaning and risk of over diagnosis. yes  Discussion included meaning and risk of False Positives. yes  Discussion included meaning of total radiation exposure. yes  Counseling Included:  Importance of adherence to annual lung cancer LDCT screening. yes  Impact of comorbidities on ability to participate in the program. yes  Ability and willingness to under diagnostic treatment. yes  Smoking Cessation Counseling:  Current Smokers:   Discussed importance of smoking cessation. NA  Information about tobacco cessation classes and interventions provided to patient. yes  Patient provided with "ticket" for LDCT Scan. yes  Symptomatic Patient. no  Counseling  Diagnosis Code: Tobacco Use Z72.0  Asymptomatic Patient yes  Counseling (Intermediate counseling: > three minutes counseling) [id number]  Former Smokers:   Discussed the importance of maintaining cigarette abstinence. yes  Diagnosis Code: Personal History of Nicotine Dependence. N56.213  Information about tobacco cessation classes and interventions provided to patient. Yes  Patient provided with "ticket" for LDCT Scan. yes  Written Order for Lung Cancer  Screening with LDCT placed in Epic. Yes (CT Chest Lung Cancer Screening Low Dose W/O CM) [id number] Z12.2-Screening of respiratory organs Z87.891-Personal history of nicotine dependence  I spent 25 minutes of face to face time with Todd Weeks  discussing the risks and benefits of lung cancer screening. We viewed a power point together that explained in detail the above noted topics. We took the time to pause the power point at intervals to allow for questions to be asked and answered to ensure understanding. We discussed that he had taken the single most powerful action possible to decrease his risk of developing lung cancer when he quit smoking. I counseled him to remain smoke free, and to contact me if he ever had the desire to smoke again so that I can provide resources and tools to help support the effort to remain smoke free. We discussed the time and location of the scan, and that either  Todd Glassman RN or I will call with the results within  24-48 hours of receiving them. He has my card and contact information in the event he needs to speak with me, in addition to a copy of the power point we reviewed as a resource. He verbalized understanding of all of the above and had no further questions upon leaving the office.     I explained to the patient that there has been a high incidence of coronary artery disease noted on these exams. I explained that this is a non-gated exam therefore degree or severity cannot be determined. This patient is on statin therapy. I have asked the patient to follow-up with their PCP regarding any incidental finding of coronary artery disease and management with diet or medication as they feel is clinically  indicated. The patient verbalized understanding of the above and had no further questions.     Magdalen Spatz, NP 07/30/2018 11:48 AM

## 2018-07-31 ENCOUNTER — Telehealth: Payer: Self-pay | Admitting: Pulmonary Disease

## 2018-07-31 NOTE — Telephone Encounter (Signed)
ATC patient, unable to reach LMTCB x1 on preferred phone number listed for patient.  

## 2018-07-31 NOTE — Telephone Encounter (Signed)
07/31/18 1122  Can we contact the patient and see how he is doing on his Symbicort and to see how he did when we trialed him off of the Symbicort.  Just wanted to get an update to see if he is in fact still using the inhaler or if he is done fine being off of the inhaler.  If patient is stable and doing fine he can keep regular follow-up with our office.  Wyn Quaker FNP

## 2018-08-01 ENCOUNTER — Other Ambulatory Visit: Payer: Self-pay | Admitting: Acute Care

## 2018-08-01 DIAGNOSIS — Z87891 Personal history of nicotine dependence: Secondary | ICD-10-CM

## 2018-08-01 DIAGNOSIS — Z122 Encounter for screening for malignant neoplasm of respiratory organs: Secondary | ICD-10-CM

## 2018-08-03 NOTE — Progress Notes (Signed)
Todd Weeks is a 62 y.o. male is here for follow up.  History of Present Illness:   Todd Weeks, CMA acting as scribe for Dr. Briscoe Deutscher.   HPI: Patient in office for follow up. He has seen pulmonology, had a Cardiac CT last week. Just wanted to review all results. He is also in for follow up on Xanax .5 mg as needed and Prozac 20mg  daily. He has noticed a big improvement in mood. He does not need need xanax every day but does need as needed. Would like to have refill.   Went to [place] for vacation last week. Went up to 14k altitude and did well. PFTs, CT reviewed with patient.   Already had flu shot. Needs Shingrix, PNA 23.   Depression screen [facility] 2/9 08/04/2018 06/03/2018 08/12/2017  Decreased Interest 0 3 0  Down, Depressed, Hopeless 1 3 1   PHQ - 2 Score 1 6 1   Altered sleeping 0 3 0  Tired, decreased energy 0 2 0  Change in appetite 1 3 0  Feeling bad or failure about yourself  0 1 0  Trouble concentrating 0 1 0  Moving slowly or fidgety/restless 0 2 0  Suicidal thoughts 0 0 0  PHQ-9 Score 2 18 1   Difficult doing work/chores Not difficult at all Somewhat difficult Not difficult at all   PMHx, SurgHx, SocialHx, FamHx, Medications, and Allergies were reviewed in the Visit Navigator and updated as appropriate.   Patient Active Problem List   Diagnosis Date Noted  . Depression, major, single episode, in partial remission ([place]) 08/04/2018  . Anxiety 06/12/2018  . Dyspnea 05/01/2018  . GERD (gastroesophageal reflux disease) 05/01/2018  . Insulin resistance 09/03/2017  . Numbness of right anterior thigh 01/21/2017  . Lesion or mass of paranasal sinuses 01/18/2016  . Obesity (BMI 30.0-34.9) 10/29/2015  . Subacromial bursitis 06/01/2015  . Hyperlipidemia 05/04/2015  . Atherosclerosis of native arteries of extremity with intermittent claudication ([place]) 05/22/2012  . CAD (coronary artery disease) 02/02/2011  . Abnormal cardiovascular function study 01/04/2011  .  Chest pain 11/09/2010  . Elevated PSA 08/06/2007  . Former smoker 08/01/2007  . HTN (hypertension) 08/01/2007   Social History   Tobacco Use  . Smoking status: Former Smoker    Packs/day: 1.50    Years: 46.00    Pack years: 69.00    Types: Cigarettes    Last attempt to quit: 12/12/2011    Years since quitting: 6.6  . Smokeless tobacco: Never Used  Substance Use Topics  . Alcohol use: Yes    Alcohol/week: 1.0 standard drinks    Types: 1 Cans of beer per week    Comment: a couple of beer on a weekend  . Drug use: Yes    Types: Cocaine, Marijuana, "Crack" cocaine    Comment: not since 1999   Current Medications and Allergies:   .  ALPRAZolam (XANAX) 0.5 MG tablet, Take 1 tablet (0.5 mg total) by mouth 2 (two) times daily as needed for anxiety. .  clopidogrel (PLAVIX) 75 MG tablet, TAKE (1) TABLET BY MOUTH ONCE DAILY., Disp: 90 tablet, Rfl: 0 .  famotidine (PEPCID) 20 MG tablet, TAKE (1) TABLET BY MOUTH AT BEDTIME., Disp: 90 tablet, Rfl: 0 .  FLUoxetine (PROZAC) 20 MG capsule, Take 1 capsule (20 mg total) by mouth every morning., Disp: 90 capsule, Rfl: 3 .  irbesartan (AVAPRO) 150 MG tablet, TAKE 2 TABLETS BY MOUTH DAILY., Disp: 180 tablet, Rfl: 0 .  simvastatin (ZOCOR) 20  MG tablet, TAKE 1 TABLET BY MOUTH DAILY AT 6 PM., Disp: 90 tablet, Rfl: 0  No Known Allergies   Review of Systems   Pertinent items are noted in the HPI. Otherwise, a complete ROS is negative.  Vitals:   Vitals:   08/04/18 0807  BP: 126/78  Pulse: 68  Temp: 97.8 F (36.6 C)  TempSrc: Oral  SpO2: 95%  Weight: 240 lb (108.9 kg)  Height: 6\' 2"  (1.88 m)     Body mass index is 30.81 kg/m.  Physical Exam:   Physical Exam Vitals signs and nursing note reviewed.  Constitutional:      General: He is not in acute distress.    Appearance: He is well-developed.  HENT:     Head: Normocephalic and atraumatic.     Right Ear: External ear normal.     Left Ear: External ear normal.     Nose: Nose normal.   Eyes:     Conjunctiva/sclera: Conjunctivae normal.     Pupils: Pupils are equal, round, and reactive to light.  Neck:     Musculoskeletal: Neck supple.  Cardiovascular:     Rate and Rhythm: Normal rate and regular rhythm.  Pulmonary:     Effort: Pulmonary effort is normal.     Breath sounds: No wheezing or rhonchi.  Abdominal:     General: Bowel sounds are normal.     Palpations: Abdomen is soft.  Musculoskeletal: Normal range of motion.  Skin:    General: Skin is warm.  Neurological:     Mental Status: He is alert.  Psychiatric:        Behavior: Behavior normal.     Results for orders placed or performed in visit on 07/04/18  Pulmonary function test  Result Value Ref Range   FVC-Pre 5.00 L   FVC-%Pred-Pre 97 %   FVC-Post 5.00 L   FVC-%Pred-Post 98 %   FVC-%Change-Post 0 %   FEV1-Pre 3.85 L   FEV1-%Pred-Pre 100 %   FEV1-Post 3.73 L   FEV1-%Pred-Post 97 %   FEV1-%Change-Post -3 %   FEV6-Pre 4.93 L   FEV6-%Pred-Pre 101 %   FEV6-Post 4.95 L   FEV6-%Pred-Post 101 %   FEV6-%Change-Post 0 %   Pre FEV1/FVC ratio 77 %   FEV1FVC-%Pred-Pre 102 %   Post FEV1/FVC ratio 75 %   FEV1FVC-%Change-Post -3 %   Pre FEV6/FVC Ratio 99 %   FEV6FVC-%Pred-Pre 103 %   Post FEV6/FVC ratio 99 %   FEV6FVC-%Pred-Post 103 %   FEV6FVC-%Change-Post 0 %   FEF 25-75 Pre 3.21 L/sec   FEF2575-%Pred-Pre 104 %   FEF 25-75 Post 2.70 L/sec   FEF2575-%Pred-Post 88 %   FEF2575-%Change-Post -15 %   RV 2.63 L   RV % pred 107 %   TLC 7.62 L   TLC % pred 101 %   DLCO unc 31.78 ml/min/mmHg   DLCO unc % pred 89 %   DLCO cor 31.78 ml/min/mmHg   DLCO cor % pred 89 %   DL/VA 4.18 ml/min/mmHg/L   DL/VA % pred 88 %   Ct Chest Lung Ca Screen Low Dose W/o Cm  Result Date: 07/31/2018 CLINICAL DATA:  62 year old male former smoker (quit in 2012) with 41 pack-year history of smoking. Lung cancer screening examination. EXAM: CT CHEST WITHOUT CONTRAST LOW-DOSE FOR LUNG CANCER SCREENING TECHNIQUE:  Multidetector CT imaging of the chest was performed following the standard protocol without IV contrast. COMPARISON:  No priors. FINDINGS: Cardiovascular: Heart size is normal. There is  no significant pericardial fluid, thickening or pericardial calcification. There is aortic atherosclerosis, as well as atherosclerosis of the great vessels of the mediastinum and the coronary arteries, including calcified atherosclerotic plaque in the left anterior descending and right coronary arteries. Mediastinum/Nodes: No pathologically enlarged mediastinal or hilar lymph nodes. Please note that accurate exclusion of hilar adenopathy is limited on noncontrast CT scans. Esophagus is unremarkable in appearance. No axillary lymphadenopathy. Lungs/Pleura: A few scattered small pulmonary nodules are noted in the lungs, largest of which is in the medial aspect of the right lower lobe (axial image 177 of series 3), with a volume derived mean diameter 5.0 mm. No larger more suspicious appearing pulmonary nodules or masses are noted. No acute consolidative airspace disease. No pleural effusions. Diffuse bronchial wall thickening with mild centrilobular and paraseptal emphysema. Upper Abdomen: Aortic atherosclerosis. Musculoskeletal: There are no aggressive appearing lytic or blastic lesions noted in the visualized portions of the skeleton. IMPRESSION: 1. Lung-RADS 2S, benign appearance or behavior. Continue annual screening with low-dose chest CT without contrast in 12 months. 2. The "S" modifier above refers to potentially clinically significant non lung cancer related findings. Specifically, there is aortic atherosclerosis, in addition to 2 vessel coronary artery disease. Please note that although the presence of coronary artery calcium documents the presence of coronary artery disease, the severity of this disease and any potential stenosis cannot be assessed on this non-gated CT examination. Assessment for potential risk factor  modification, dietary therapy or pharmacologic therapy may be warranted, if clinically indicated. 3. Mild diffuse bronchial wall thickening with mild centrilobular and paraseptal emphysema; imaging findings suggestive of underlying COPD. Aortic Atherosclerosis (ICD10-I70.0) and Emphysema (ICD10-J43.9). Electronically Signed   By: Vinnie Langton M.D.   On: 07/31/2018 08:30    Assessment and Plan:   Jajuan was seen today for follow-up.  Diagnoses and all orders for this visit:  Screening for malignant neoplasm of colon -     Ambulatory referral to Gastroenterology  Anxiety about health Comments: Improved significantly. Continue current treatment.  Orders: -     ALPRAZolam (XANAX) 0.5 MG tablet; Take 1 tablet (0.5 mg total) by mouth 2 (two) times daily as needed for anxiety. -     FLUoxetine (PROZAC) 20 MG capsule; Take 1 capsule (20 mg total) by mouth every morning.  Essential hypertension Comments: No concerns. Continue current treatment.  Orders: -     irbesartan (AVAPRO) 150 MG tablet; Take 2 tablets (300 mg total) by mouth daily.  Mixed hyperlipidemia Comments: No concerns. Continue current treatment.  Orders: -     simvastatin (ZOCOR) 20 MG tablet; TAKE 1 TABLET BY MOUTH DAILY AT 6 PM.  Sleep disorder breathing Comments: Morning fatigue. Upcoming sleep study - pushed back appointment b/c overwhelmed with tests and visits.   Coronary artery disease involving native coronary artery of native heart without angina pectoris -     clopidogrel (PLAVIX) 75 MG tablet; TAKE (1) TABLET BY MOUTH ONCE DAILY.  Gastroesophageal reflux disease without esophagitis -     famotidine (PEPCID) 20 MG tablet; One daily  Need for pneumococcal vaccination -     Pneumococcal polysaccharide vaccine 23-valent greater than or equal to 2yo subcutaneous/IM   . Orders and follow up as documented in [place], reviewed diet, exercise and weight control, cardiovascular risk and specific lipid/LDL goals  reviewed, reviewed medications and side effects in detail.  . Reviewed expectations re: course of current medical issues. . Outlined signs and symptoms indicating need for more acute intervention. Marland Kitchen  Patient verbalized understanding and all questions were answered. . Patient received an After Visit Summary.  CMA served as Education administrator during this visit. History, Physical, and Plan performed by medical provider. The above documentation has been reviewed and is accurate and complete. Briscoe Deutscher, D.O.  Briscoe Deutscher, DO [place], [facility] 08/04/2018

## 2018-08-04 ENCOUNTER — Encounter: Payer: Self-pay | Admitting: Family Medicine

## 2018-08-04 ENCOUNTER — Ambulatory Visit (INDEPENDENT_AMBULATORY_CARE_PROVIDER_SITE_OTHER): Payer: PPO | Admitting: Family Medicine

## 2018-08-04 VITALS — BP 126/78 | HR 68 | Temp 97.8°F | Ht 74.0 in | Wt 240.0 lb

## 2018-08-04 DIAGNOSIS — F418 Other specified anxiety disorders: Secondary | ICD-10-CM | POA: Diagnosis not present

## 2018-08-04 DIAGNOSIS — I10 Essential (primary) hypertension: Secondary | ICD-10-CM | POA: Diagnosis not present

## 2018-08-04 DIAGNOSIS — Z1211 Encounter for screening for malignant neoplasm of colon: Secondary | ICD-10-CM

## 2018-08-04 DIAGNOSIS — K219 Gastro-esophageal reflux disease without esophagitis: Secondary | ICD-10-CM

## 2018-08-04 DIAGNOSIS — G4730 Sleep apnea, unspecified: Secondary | ICD-10-CM | POA: Diagnosis not present

## 2018-08-04 DIAGNOSIS — I2510 Atherosclerotic heart disease of native coronary artery without angina pectoris: Secondary | ICD-10-CM

## 2018-08-04 DIAGNOSIS — E782 Mixed hyperlipidemia: Secondary | ICD-10-CM

## 2018-08-04 DIAGNOSIS — Z23 Encounter for immunization: Secondary | ICD-10-CM

## 2018-08-04 DIAGNOSIS — F324 Major depressive disorder, single episode, in partial remission: Secondary | ICD-10-CM | POA: Insufficient documentation

## 2018-08-04 MED ORDER — FLUOXETINE HCL 20 MG PO CAPS: 20.0000 mg | ORAL_CAPSULE | Freq: Every morning | ORAL | 3 refills | Status: DC

## 2018-08-04 MED ORDER — CLOPIDOGREL BISULFATE 75 MG PO TABS: ORAL_TABLET | ORAL | 2 refills | Status: DC

## 2018-08-04 MED ORDER — FAMOTIDINE 20 MG PO TABS: ORAL_TABLET | ORAL | 3 refills | Status: DC

## 2018-08-04 MED ORDER — ALPRAZOLAM 0.5 MG PO TABS: 0.5000 mg | ORAL_TABLET | Freq: Two times a day (BID) | ORAL | 0 refills | Status: DC | PRN

## 2018-08-04 MED ORDER — IRBESARTAN 150 MG PO TABS: 300.0000 mg | ORAL_TABLET | Freq: Every day | ORAL | 3 refills | Status: DC

## 2018-08-04 MED ORDER — SIMVASTATIN 20 MG PO TABS: ORAL_TABLET | ORAL | 3 refills | Status: DC

## 2018-08-04 NOTE — Telephone Encounter (Signed)
ATC X2 lmom

## 2018-08-05 ENCOUNTER — Encounter: Payer: Self-pay | Admitting: *Deleted

## 2018-08-05 NOTE — Telephone Encounter (Signed)
Will close encounter as this patient has been contacted multiple times  Nothing further needed

## 2018-08-05 NOTE — Telephone Encounter (Signed)
ATC pt, no answer. Left message for pt to call back.  

## 2018-08-25 ENCOUNTER — Ambulatory Visit (INDEPENDENT_AMBULATORY_CARE_PROVIDER_SITE_OTHER): Payer: PPO | Admitting: Gastroenterology

## 2018-08-25 ENCOUNTER — Encounter: Payer: Self-pay | Admitting: Gastroenterology

## 2018-08-25 VITALS — BP 146/68 | HR 84 | Ht 72.0 in | Wt 241.0 lb

## 2018-08-25 DIAGNOSIS — Z1211 Encounter for screening for malignant neoplasm of colon: Secondary | ICD-10-CM | POA: Diagnosis not present

## 2018-08-25 DIAGNOSIS — I70219 Atherosclerosis of native arteries of extremities with intermittent claudication, unspecified extremity: Secondary | ICD-10-CM | POA: Diagnosis not present

## 2018-08-25 DIAGNOSIS — I2510 Atherosclerotic heart disease of native coronary artery without angina pectoris: Secondary | ICD-10-CM

## 2018-08-25 MED ORDER — NA SULFATE-K SULFATE-MG SULF 17.5-3.13-1.6 GM/177ML PO SOLN: 1.0000 | Freq: Once | ORAL | 0 refills | Status: AC

## 2018-08-25 NOTE — Progress Notes (Signed)
[facility] Consult Note:  History: Todd Weeks 08/25/2018  Referring physician: Briscoe Deutscher, DO  Reason for consult/chief complaint: Colon Cancer Screening (screening, pt states his last colon was at [facility] x 10 years ago. Pt is now due and is on Plavix. )   Subjective  HPI:  Very pleasant 63 year old man referred by primary care to discuss screening colonoscopy.  From Dr.McAlhany's most recent cardiology clinic note October 2019: ". PAD: No rest pain or lower extremity ulcerations. He does have claudication when walking [place] distance. He has a follow up appt with Dr. Oneida Alar soon in the [facility] office. He will have all PAD testing done in the [facility] office.  Marland Kitchen CAD with angina: Mild CAD by cath in 2012. Recent normal stress test. LV function is normal by echo. He has had no exertional chest pain. I will continue Plavix and statin. I told him today that he could be changed to ASA and stop his Plavix but he does not wish to do this today. "  He denies chronic abdominal pain, altered bowel habits or rectal bleeding.  He has occasional heartburn under control with as needed medicine.  He denies dysphagia.  He has had a 55 pound purposeful weight loss over bout the last 6 months with diet and lifestyle changes.   Reports last colonoscopy 10 years ago with [facility].  ROS:  Review of Systems  Constitutional: Negative for appetite change and unexpected weight change.  HENT: Negative for mouth sores and voice change.   Eyes: Negative for pain and redness.  Respiratory: Negative for cough and shortness of breath.   Cardiovascular: Negative for chest pain and palpitations.  Genitourinary: Negative for dysuria and hematuria.  Musculoskeletal: Negative for arthralgias and myalgias.  Skin: Negative for pallor and rash.  Neurological: Negative for weakness and headaches.  Hematological: Negative for adenopathy.     Past Medical History: Past Medical History:  Diagnosis  Date  . Bell's palsy   . Closed nondisplaced fracture of neck of third metacarpal bone of right hand 02/04/2017  . Depression   . Diverticulitis   . Headache(784.0)   . HTN (hypertension)   . PVD (peripheral vascular disease) ([place])   . Sexual dysfunction      Past Surgical History: History reviewed. No pertinent surgical history.   Family History: Family History  Problem Relation Age of Onset  . Hypertension Mother   . Deep vein thrombosis Father   . Diabetes Brother   . Tuberculosis Maternal Grandfather   . Emphysema Maternal Grandfather   . Lung disease Brother   . Asthma Son     Social History: Social History   Socioeconomic History  . Marital status: Single    Spouse name: Not on file  . Number of children: 2  . Years of education: 21  . Highest education level: Not on file  Occupational History  . Occupation: Electrical engineer  . Occupation: Unemployed    Comment: disability  Social Needs  . Financial resource strain: Not on file  . Food insecurity:    Worry: Not on file    Inability: Not on file  . Transportation needs:    Medical: Not on file    Non-medical: Not on file  Tobacco Use  . Smoking status: Former Smoker    Packs/day: 1.50    Years: 46.00    Pack years: 69.00    Types: Cigarettes    Last attempt to quit: 12/12/2011    Years  since quitting: 6.7  . Smokeless tobacco: Never Used  Substance and Sexual Activity  . Alcohol use: Yes    Alcohol/week: 1.0 standard drinks    Types: 1 Cans of beer per week    Comment: a couple of beer on a weekend  . Drug use: Yes    Types: Cocaine, Marijuana, "Crack" cocaine    Comment: not since 1999  . Sexual activity: Not on file  Lifestyle  . Physical activity:    Days per week: Not on file    Minutes per session: Not on file  . Stress: Not on file  Relationships  . Social connections:    Talks on phone: Not on file    Gets together: Not on file    Attends religious service: Not on file     Active member of club or organization: Not on file    Attends meetings of clubs or organizations: Not on file    Relationship status: Not on file  Other Topics Concern  . Not on file  Social History Narrative   Raised in [place], [place]. Does not have any religious beliefs that would effect healthcare. Lives in house with sister. Likes to ride motorcycle for fun.    Moving out on his own; in the next couple of weeks will be moving back home that he had rented      Rives; with a helmet   Plans to move to [place] within several months to be near his son.  Allergies: No Known Allergies  Outpatient Meds: Current Outpatient Medications  Medication Sig Dispense Refill  . ALPRAZolam (XANAX) 0.5 MG tablet Take 1 tablet (0.5 mg total) by mouth 2 (two) times daily as needed for anxiety. 30 tablet 0  . clopidogrel (PLAVIX) 75 MG tablet TAKE (1) TABLET BY MOUTH ONCE DAILY. 90 tablet 2  . famotidine (PEPCID) 20 MG tablet One daily 90 tablet 3  . FLUoxetine (PROZAC) 20 MG capsule Take 1 capsule (20 mg total) by mouth every morning. 90 capsule 3  . irbesartan (AVAPRO) 150 MG tablet Take 2 tablets (300 mg total) by mouth daily. 180 tablet 3  . phentermine 37.5 MG capsule Take 37.5 mg by mouth every morning.    . simvastatin (ZOCOR) 20 MG tablet TAKE 1 TABLET BY MOUTH DAILY AT 6 PM. 90 tablet 3   No current facility-administered medications for this visit.       ___________________________________________________________________ Objective   Exam:  BP (!) 146/68   Pulse 84   Ht 6' (1.829 m)   Wt 241 lb (109.3 kg)   BMI 32.69 kg/m    General: this is a(n) well-appearing man  Eyes: sclera anicteric, no redness  ENT: oral mucosa moist without lesions, no cervical or supraclavicular lymphadenopathy  CV: RRR without murmur, S1/S2, no JVD, no peripheral edema  Resp: clear to auscultation bilaterally, normal RR and effort noted  GI: soft, no tenderness, with active bowel sounds. No  guarding or palpable organomegaly noted.  Skin; warm and dry, no rash or jaundice noted  Neuro: awake, alert and oriented x 3. Normal gross motor function and fluent speech   Assessment: Encounter Diagnoses  Name Primary?  . Special screening for malignant neoplasms, colon Yes  . Coronary artery disease involving native coronary artery of native heart without angina pectoris   . Atherosclerosis of native artery of lower extremity with intermittent claudication, unspecified laterality [facility])     Patient must be off Plavix 5 days prior colonoscopy.  He is  agreeable to procedure after discussion of risks and benefits.  The benefits and risks of the planned procedure were described in detail with the patient or (when appropriate) their health care proxy.  Risks were outlined as including, but not limited to, bleeding, infection, perforation, adverse medication reaction leading to cardiac or pulmonary decompensation, or pancreatitis (if ERCP).  The limitation of incomplete mucosal visualization was also discussed.  No guarantees or warranties were given.  Having reviewed last cardiology note, I do not for see any objection from cardiology to stopping Plavix 5 days before, but we will ask to be certain.   Thank you for the courtesy of this consult.  Please call me with any questions or concerns.  Nelida Meuse III  CC: Referring provider noted above

## 2018-08-25 NOTE — Patient Instructions (Signed)
If you are age 64 or older, your body mass index should be between 23-30. Your Body mass index is 32.69 kg/m. If this is out of the aforementioned range listed, please consider follow up with your Primary Care Provider.  If you are age 71 or younger, your body mass index should be between 19-25. Your Body mass index is 32.69 kg/m. If this is out of the aformentioned range listed, please consider follow up with your Primary Care Provider.   You have been scheduled for a colonoscopy. Please follow written instructions given to you at your visit today.  Please pick up your prep supplies at the pharmacy within the next 1-3 days. If you use inhalers (even only as needed), please bring them with you on the day of your procedure. Your physician has requested that you go to www.startemmi.com and enter the access code given to you at your visit today. This web site gives a general overview about your procedure. However, you should still follow specific instructions given to you by our office regarding your preparation for the procedure.  It was a pleasure to see you today!  Dr. Loletha Carrow

## 2018-09-21 NOTE — Progress Notes (Deleted)
@Patient  ID: Todd Weeks, male    DOB: December 04, 1955, 64 y.o.   MRN: [id number]  No chief complaint on file.   Referring provider: Briscoe Deutscher, DO  HPI:  63 year old male former smoker last seen in our office in April/2017 followed for dyspnea on exertion.  PMH:  hypertension, obesity, peripheral arterial disease, and mild nonobstructive coronary artery disease on remote cardiac cath Smoker/ Smoking History: Former smoker.  69 pack years. Maintenance:  Symbicort 160 Pt of: Dr. Melvyn Novas  Recent [facility] Encounters:     09/21/2018  - Visit   HPI  Tests:  04/26/2018- troponins in the ED-negative 04/25/2018-CBC-normal 04/25/2018- normal with the exception of a mildly elevated BUN at 26  04/25/2018-chest x-ray-mild bronchitic changes, no focal consolidation, no pneumothorax, no pleural effusions Shortness of breath, patient has not completed PFT  10/28/2014-pulmonary function test- FVC 4.61 (88% predicted), postbronchodilator ratio 74, FEV1 88, no bronchodilator response, DLCO 84, minimal airway obstruction, suggesting small airway disease  10/27/2015-spirometry in office- normal spirometry, FVC 4.9 (99% predicted), ratio 71, FEV1 91%  07/04/2018-pulmonary function test- FVC 5.00 (97% predicted), postbronchodilator ratio 75, FEV1 97, no significant bronchodilator response, DLCO 89 >>>Patient did take daily dose of Symbicort 160 before testing  04/26/2018-echocardiogram-LV ejection fraction 55 to 60%, main pulmonary artery normal-sized   05/12/2018- stress test-EF 53%, low risk stress test without evidence of ischemia  FENO:  No results found for: NITRICOXIDE  PFT: PFT Results Latest Ref Rng & Units 07/04/2018 10/28/2014  FVC-Pre L 5.00 4.61  FVC-Predicted Pre % 97 88  FVC-Post L 5.00 4.66  FVC-Predicted Post % 98 89  Pre FEV1/FVC % % 77 73  Post FEV1/FCV % % 75 74  FEV1-Pre L 3.85 3.35  FEV1-Predicted Pre % 100 85  FEV1-Post L 3.73 3.47  DLCO UNC% % 89 84  DLCO COR  %Predicted % 88 95  TLC L 7.62 7.78  TLC % Predicted % 101 103  RV % Predicted % 107 122    Imaging: No results found.    Specialty Problems      Pulmonary Problems   Lesion or mass of paranasal sinuses   Dyspnea    10/27/2015-spirometry in office- normal spirometry, FVC 4.9 (99% predicted), ratio 71, FEV1 91%  10/28/2014-pulmonary function test- FVC 4.61 (88% predicted), postbronchodilator ratio 74, FEV1 88, no bronchodilator response, DLCO 84, minimal airway obstruction, suggesting small airway disease  07/04/2018-pulmonary function test- FVC 5.00 (97% predicted), postbronchodilator ratio 75, FEV1 97, no significant bronchodilator response, DLCO 89 >>>Patient did take daily dose of Symbicort 160 before testing  Symbicort 160 trial to be off          No Known Allergies  Immunization History  Administered Date(s) Administered  . Influenza Split 05/14/2015  . Influenza,inj,Quad PF,6+ Mos 08/02/2016, 06/16/2017, 06/12/2018  . Influenza-Unspecified 06/16/2017  . Pneumococcal Polysaccharide-23 08/04/2018  . Tdap 12/13/2014    Past Medical History:  Diagnosis Date  . Bell's palsy   . Closed nondisplaced fracture of neck of third metacarpal bone of right hand 02/04/2017  . Depression   . Diverticulitis   . Headache(784.0)   . HTN (hypertension)   . PVD (peripheral vascular disease) ([place])   . Sexual dysfunction     Tobacco History: Social History   Tobacco Use  Smoking Status Former Smoker  . Packs/day: 1.50  . Years: 46.00  . Pack years: 69.00  . Types: Cigarettes  . Last attempt to quit: 12/12/2011  . Years since quitting: 6.7  Smokeless Tobacco  Never Used   Counseling given: Not Answered   Outpatient Encounter Medications as of 09/22/2018  Medication Sig  . ALPRAZolam (XANAX) 0.5 MG tablet Take 1 tablet (0.5 mg total) by mouth 2 (two) times daily as needed for anxiety.  . clopidogrel (PLAVIX) 75 MG tablet TAKE (1) TABLET BY MOUTH ONCE DAILY.  . famotidine  (PEPCID) 20 MG tablet One daily  . FLUoxetine (PROZAC) 20 MG capsule Take 1 capsule (20 mg total) by mouth every morning.  . irbesartan (AVAPRO) 150 MG tablet Take 2 tablets (300 mg total) by mouth daily.  . phentermine 37.5 MG capsule Take 37.5 mg by mouth every morning.  . simvastatin (ZOCOR) 20 MG tablet TAKE 1 TABLET BY MOUTH DAILY AT 6 PM.   No facility-administered encounter medications on file as of 09/22/2018.      Review of Systems  Review of Systems   Physical Exam  There were no vitals taken for this visit.  Wt Readings from Last 5 Encounters:  08/25/18 241 lb (109.3 kg)  08/04/18 240 lb (108.9 kg)  07/30/18 243 lb 12.8 oz (110.6 kg)  07/04/18 240 lb (108.9 kg)  06/12/18 243 lb 12.8 oz (110.6 kg)     Physical Exam    Lab Results:  CBC    Component Value Date/Time   WBC 9.3 05/07/2018 1559   RBC 4.87 05/07/2018 1559   HGB 14.6 05/07/2018 1559   HCT 42.9 05/07/2018 1559   PLT 270.0 05/07/2018 1559   MCV 88.0 05/07/2018 1559   MCV 88.6 10/19/2014 0918   MCH 29.4 04/25/2018 2243   MCHC 34.1 05/07/2018 1559   RDW 13.1 05/07/2018 1559   LYMPHSABS 2.1 05/07/2018 1559   MONOABS 0.7 05/07/2018 1559   EOSABS 0.2 05/07/2018 1559   BASOSABS 0.1 05/07/2018 1559    BMET    Component Value Date/Time   NA 137 05/07/2018 1559   K 4.0 05/07/2018 1559   CL 101 05/07/2018 1559   CO2 27 05/07/2018 1559   GLUCOSE 86 05/07/2018 1559   BUN 19 05/07/2018 1559   CREATININE 0.73 05/07/2018 1559   CALCIUM 9.7 05/07/2018 1559   GFRNONAA >60 04/25/2018 2243   GFRAA >60 04/25/2018 2243    BNP No results found for: BNP  ProBNP No results found for: PROBNP    Assessment & Plan:     No problem-specific Assessment & Plan notes found for this encounter.     Lauraine Rinne, NP 09/21/2018   This appointment was *** with over 50% of the time in direct face-to-face patient care, assessment, plan of care, and follow-up.

## 2018-09-22 ENCOUNTER — Ambulatory Visit: Payer: PPO | Admitting: Pulmonary Disease

## 2018-09-22 ENCOUNTER — Encounter: Payer: Self-pay | Admitting: Gastroenterology

## 2018-09-22 ENCOUNTER — Encounter: Payer: Self-pay | Admitting: Adult Health

## 2018-09-22 ENCOUNTER — Ambulatory Visit (INDEPENDENT_AMBULATORY_CARE_PROVIDER_SITE_OTHER): Payer: PPO | Admitting: Adult Health

## 2018-09-22 DIAGNOSIS — R0609 Other forms of dyspnea: Secondary | ICD-10-CM | POA: Diagnosis not present

## 2018-09-22 DIAGNOSIS — E669 Obesity, unspecified: Secondary | ICD-10-CM | POA: Diagnosis not present

## 2018-09-22 NOTE — Patient Instructions (Addendum)
Continue activity as tolerated.  Follow up with pulmonary As needed

## 2018-09-22 NOTE — Assessment & Plan Note (Signed)
Dyspnea with an extensive work-up showing normal lung function on PFTs. Recent LDCT  chest in December 2019 showed mild emphysema , benign appearance. Essentially his dyspnea seems to have resolved.  May been a component of anxiety and deconditioning.  Patient has had extensive weight loss and is exercising on a regular basis.  He seems to be doing very well.  Have advised him to follow-up with pulmonary on an as-needed basis.  Continue with his low-dose CT screening as recommended.

## 2018-09-22 NOTE — Progress Notes (Signed)
@Patient  ID: Todd Weeks, male    DOB: Dec 25, 1955, 63 y.o.   MRN: [id number]  Chief Complaint  Patient presents with  . Follow-up    Dyspnea    Referring provider: Briscoe Deutscher, DO  HPI: 63 year old male former heavy smoker (quit 2013) seen initially for pulmonary consult 2017 for dyspnea and cough.  Cough resolved with discontinuation of ACE inhibitor.  Patient returned 2019 with increased complaints of dyspnea with normal pulmonary function testing.  Mild emphysema on CT scan.   TEST/EVENTS :   Labs 04/26/2018- troponins in the ED-negative 04/25/2018-CBC-normal   Spirometry/PFT 10/28/2014-pulmonary function test- FVC 4.61 (88% predicted), postbronchodilator ratio 74, FEV1 88, no bronchodilator response, DLCO 84, minimal airway obstruction, suggesting small airway disease  10/27/2015-spirometry in office- normal spirometry, FVC 4.9 (99% predicted), ratio 71, FEV1 91%  07/04/2018-pulmonary function test- FVC 5.00 (97% predicted), postbronchodilator ratio 75, FEV1 97, no significant bronchodilator response, DLCO 89 >>>Patient did take daily dose of Symbicort 160 before testing  Card 04/26/2018-echocardiogram-LV ejection fraction 55 to 60%, main pulmonary artery normal-sized   05/12/2018- stress test-EF 53%, low risk stress test without evidence of ischemia   09/22/2018 Follow up : Dyspnea, Emphysema  Patient presents for a 66-month follow-up.  Patient was seen few months ago for increased dyspnea.  Had an extensive work-up with pulmonary function testing showing normal lung function with an FEV1 at 97% DLCO 89% FVC 97%.  Ratio 75 and no significant bronchodilator response.  Patient had initially been treated with Symbicort but had no perceived benefit.  This was stopped last visit.  Patient says he did not notice any difference in his breathing.  Says that his shortness of breath has essentially resolved.  Patient says he did have some increased anxiety and stress during this  time.Marland Kitchen  Has recently changed medications started exercising and losing weight and feels significantly improved.  His shortness of breath has resolved. Patient also underwent a cardiac evaluation with a normal 2D echo EF was 55 to 60%.  Pulmonary arteries normal size.  Patient had a cardiac stress test that showed low risk without any evidence of ischemia.  He denies any chest pain. Patient does have an albuterol inhaler.  He says he never uses this.  Patient says he has been doing very well.  He continues to exercise and has lost over 60 pounds.  Says he is very active he exercises most days.  He recently went to [place] over the holidays and says he did exceptionally well.  Patient does have a heavy smoking history.  He recently had a low-dose CT screening December 2019 that showed benign appearance with recommendations for follow-up in 12 months.  No Known Allergies  Immunization History  Administered Date(s) Administered  . Influenza Split 05/14/2015  . Influenza,inj,Quad PF,6+ Mos 08/02/2016, 06/16/2017, 06/12/2018  . Influenza-Unspecified 06/16/2017  . Pneumococcal Polysaccharide-23 08/04/2018  . Tdap 12/13/2014    Past Medical History:  Diagnosis Date  . Bell's palsy   . Closed nondisplaced fracture of neck of third metacarpal bone of right hand 02/04/2017  . Depression   . Diverticulitis   . Headache(784.0)   . HTN (hypertension)   . PVD (peripheral vascular disease) ([place])   . Sexual dysfunction     Tobacco History: Social History   Tobacco Use  Smoking Status Former Smoker  . Packs/day: 1.50  . Years: 46.00  . Pack years: 69.00  . Types: Cigarettes  . Last attempt to quit: 12/12/2011  . Years since quitting:  6.7  Smokeless Tobacco Never Used   Counseling given: Not Answered   Outpatient Medications Prior to Visit  Medication Sig Dispense Refill  . ALPRAZolam (XANAX) 0.5 MG tablet Take 1 tablet (0.5 mg total) by mouth 2 (two) times daily as needed  for anxiety. 30 tablet 0  . clopidogrel (PLAVIX) 75 MG tablet TAKE (1) TABLET BY MOUTH ONCE DAILY. 90 tablet 2  . famotidine (PEPCID) 20 MG tablet One daily 90 tablet 3  . FLUoxetine (PROZAC) 20 MG capsule Take 1 capsule (20 mg total) by mouth every morning. 90 capsule 3  . irbesartan (AVAPRO) 150 MG tablet Take 2 tablets (300 mg total) by mouth daily. 180 tablet 3  . phentermine 37.5 MG capsule Take 37.5 mg by mouth every morning.    . simvastatin (ZOCOR) 20 MG tablet TAKE 1 TABLET BY MOUTH DAILY AT 6 PM. 90 tablet 3   No facility-administered medications prior to visit.      Review of Systems:   Constitutional:   No  weight loss, night sweats,  Fevers, chills, fatigue, or  lassitude.  HEENT:   No headaches,  Difficulty swallowing,  Tooth/dental problems, or  Sore throat,                No sneezing, itching, ear ache, nasal congestion, post nasal drip,   CV:  No chest pain,  Orthopnea, PND, swelling in lower extremities, anasarca, dizziness, palpitations, syncope.   GI  No heartburn, indigestion, abdominal pain, nausea, vomiting, diarrhea, change in bowel habits, loss of appetite, bloody stools.   Resp: No shortness of breath with exertion or at rest.  No excess mucus, no productive cough,  No non-productive cough,  No coughing up of blood.  No change in color of mucus.  No wheezing.  No chest wall deformity  Skin: no rash or lesions.  GU: no dysuria, change in color of urine, no urgency or frequency.  No flank pain, no hematuria   MS:  No joint pain or swelling.  No decreased range of motion.  No back pain.    Physical Exam  BP 136/72 (BP Location: Left Arm, Cuff Size: Normal)   Pulse 65   Ht 6' (1.829 m)   Wt 233 lb (105.7 kg)   SpO2 98%   BMI 31.60 kg/m   GEN: A/Ox3; pleasant , NAD, well nourished    HEENT:  [place]/AT,  EACs-clear, TMs-wnl, NOSE-clear, THROAT-clear, no lesions, no postnasal drip or exudate noted.   NECK:  Supple w/ fair ROM; no JVD; normal carotid  impulses w/o bruits; no thyromegaly or nodules palpated; no lymphadenopathy.    RESP  Clear  P & A; w/o, wheezes/ rales/ or rhonchi. no accessory muscle use, no dullness to percussion  CARD:  RRR, no m/r/g, no peripheral edema, pulses intact, no cyanosis or clubbing.  GI:   Soft & nt; nml bowel sounds; no organomegaly or masses detected.   Musco: Warm bil, no deformities or joint swelling noted.   Neuro: alert, no focal deficits noted.    Skin: Warm, no lesions or rashes    Lab Results:  CBC    Component Value Date/Time   WBC 9.3 05/07/2018 1559   RBC 4.87 05/07/2018 1559   HGB 14.6 05/07/2018 1559   HCT 42.9 05/07/2018 1559   PLT 270.0 05/07/2018 1559   MCV 88.0 05/07/2018 1559   MCV 88.6 10/19/2014 0918   MCH 29.4 04/25/2018 2243   MCHC 34.1 05/07/2018 1559   RDW 13.1 05/07/2018  1559   LYMPHSABS 2.1 05/07/2018 1559   MONOABS 0.7 05/07/2018 1559   EOSABS 0.2 05/07/2018 1559   BASOSABS 0.1 05/07/2018 1559    BMET    Component Value Date/Time   NA 137 05/07/2018 1559   K 4.0 05/07/2018 1559   CL 101 05/07/2018 1559   CO2 27 05/07/2018 1559   GLUCOSE 86 05/07/2018 1559   BUN 19 05/07/2018 1559   CREATININE 0.73 05/07/2018 1559   CALCIUM 9.7 05/07/2018 1559   GFRNONAA >60 04/25/2018 2243   GFRAA >60 04/25/2018 2243    BNP No results found for: BNP  ProBNP No results found for: PROBNP  Imaging: No results found.    PFT Results Latest Ref Rng & Units 07/04/2018 10/28/2014  FVC-Pre L 5.00 4.61  FVC-Predicted Pre % 97 88  FVC-Post L 5.00 4.66  FVC-Predicted Post % 98 89  Pre FEV1/FVC % % 77 73  Post FEV1/FCV % % 75 74  FEV1-Pre L 3.85 3.35  FEV1-Predicted Pre % 100 85  FEV1-Post L 3.73 3.47  DLCO UNC% % 89 84  DLCO COR %Predicted % 88 95  TLC L 7.62 7.78  TLC % Predicted % 101 103  RV % Predicted % 107 122    No results found for: NITRICOXIDE      Assessment & Plan:   Dyspnea Dyspnea with an extensive work-up showing normal lung  function on PFTs. Recent LDCT  chest in December 2019 showed mild emphysema , benign appearance. Essentially his dyspnea seems to have resolved.  May been a component of anxiety and deconditioning.  Patient has had extensive weight loss and is exercising on a regular basis.  He seems to be doing very well.  Have advised him to follow-up with pulmonary on an as-needed basis.  Continue with his low-dose CT screening as recommended.  Obesity (BMI 30.0-34.9) Doing very well with weight loss.     Rexene Edison, NP 09/22/2018

## 2018-09-22 NOTE — Progress Notes (Signed)
Chart and office note reviewed in detail  > agree with a/p as outlined    

## 2018-09-22 NOTE — Assessment & Plan Note (Signed)
Doing very well with weight loss.

## 2018-10-06 ENCOUNTER — Encounter: Payer: Self-pay | Admitting: Gastroenterology

## 2018-10-06 ENCOUNTER — Ambulatory Visit (AMBULATORY_SURGERY_CENTER): Payer: PPO | Admitting: Gastroenterology

## 2018-10-06 VITALS — BP 106/49 | HR 75 | Temp 99.3°F | Resp 13 | Ht 72.0 in | Wt 233.0 lb

## 2018-10-06 DIAGNOSIS — Z1211 Encounter for screening for malignant neoplasm of colon: Secondary | ICD-10-CM | POA: Diagnosis not present

## 2018-10-06 DIAGNOSIS — D123 Benign neoplasm of transverse colon: Secondary | ICD-10-CM

## 2018-10-06 DIAGNOSIS — K635 Polyp of colon: Secondary | ICD-10-CM | POA: Diagnosis not present

## 2018-10-06 DIAGNOSIS — I739 Peripheral vascular disease, unspecified: Secondary | ICD-10-CM | POA: Diagnosis not present

## 2018-10-06 DIAGNOSIS — D124 Benign neoplasm of descending colon: Secondary | ICD-10-CM

## 2018-10-06 DIAGNOSIS — I10 Essential (primary) hypertension: Secondary | ICD-10-CM | POA: Diagnosis not present

## 2018-10-06 DIAGNOSIS — I2510 Atherosclerotic heart disease of native coronary artery without angina pectoris: Secondary | ICD-10-CM | POA: Diagnosis not present

## 2018-10-06 LAB — HM COLONOSCOPY

## 2018-10-06 MED ORDER — SODIUM CHLORIDE 0.9 % IV SOLN: 500.0000 mL | Freq: Once | INTRAVENOUS | Status: DC

## 2018-10-06 NOTE — Op Note (Signed)
[facility] Patient Name: Todd Weeks Procedure Date: 10/06/2018 7:49 AM MRN: [id number] Endoscopist: Mallie Mussel L. Loletha Carrow , MD Age: 63 Referring MD:  Date of Birth: April 07, 1956 Gender: Male Account #: [id number] Procedure:                Colonoscopy Indications:              Screening for colorectal malignant neoplasm (no                            poylps last colonoscopy > 10 yrs ago) Medicines:                Monitored Anesthesia Care Procedure:                Pre-Anesthesia Assessment:                           - Prior to the procedure, a History and Physical                            was performed, and patient medications and                            allergies were reviewed. The patient's tolerance of                            previous anesthesia was also reviewed. The risks                            and benefits of the procedure and the sedation                            options and risks were discussed with the patient.                            All questions were answered, and informed consent                            was obtained. Prior Anticoagulants: The patient has                            taken Plavix (clopidogrel), last dose was 5 days                            prior to procedure. ASA Grade Assessment: III - A                            patient with severe systemic disease. After                            reviewing the risks and benefits, the patient was                            deemed in satisfactory condition to undergo the  procedure.                           After obtaining informed consent, the colonoscope                            was passed under direct vision. Throughout the                            procedure, the patient's blood pressure, pulse, and                            oxygen saturations were monitored continuously. The                            Colonoscope was introduced through the anus and              advanced to the the cecum, identified by                            appendiceal orifice and ileocecal valve. The Model                            [id number]-H190DL [id number] scope was introduced                            through the and advanced to the. The colonoscopy                            was performed with difficulty due to multiple                            diverticula in the colon and a tortuous colon.                            Successful completion of the procedure was aided by                            changing the patient to a supine position, using                            manual pressure and withdrawing the scope and                            replacing with the pediatric colonoscope. The                            patient tolerated the procedure well. The quality                            of the bowel preparation was good. The ileocecal                            valve, appendiceal orifice, and rectum were  photographed. The bowel preparation used was SUPREP. Scope In: 8:00:36 AM Scope Out: 8:32:29 AM Scope Withdrawal Time: 0 hours 14 minutes 47 seconds  Total Procedure Duration: 0 hours 31 minutes 53 seconds  Findings:                 The perianal and digital rectal examinations were                            normal.                           Many diverticula were found in the left colon with                            associated tortuosity.                           A 8 mm polyp was found in the proximal transverse                            colon. The polyp was sessile. The polyp was removed                            with a cold snare. Resection and retrieval were                            complete.                           A 5 mm polyp was found in the descending colon. The                            polyp was sessile. The polyp was removed with a                            cold snare. Resection and retrieval were complete.                            The exam was otherwise without abnormality on                            direct and retroflexion views. Complications:            No immediate complications. Estimated Blood Loss:     Estimated blood loss was minimal. Impression:               - Diverticulosis in the left colon.                           - One 8 mm polyp in the proximal transverse colon,                            removed with a cold snare. Resected and retrieved.                           -  One 5 mm polyp in the descending colon, removed                            with a cold snare. Resected and retrieved.                           - The examination was otherwise normal on direct                            and retroflexion views. Recommendation:           - Patient has a contact number available for                            emergencies. The signs and symptoms of potential                            delayed complications were discussed with the                            patient. Return to normal activities tomorrow.                            Written discharge instructions were provided to the                            patient.                           - Resume previous diet.                           - Await pathology results.                           - Resume Plavix (clopidogrel) at prior dose in 5                            days.                           - Repeat colonoscopy is recommended for                            surveillance. The colonoscopy date will be                            determined after pathology results from today's                            exam become available for review. Henry L. Loletha Carrow, MD 10/06/2018 8:40:03 AM This report has been signed electronically.

## 2018-10-06 NOTE — Progress Notes (Signed)
Called to room to assist during endoscopic procedure.  Patient ID and intended procedure confirmed with present staff. Received instructions for my participation in the procedure from the performing physician.  

## 2018-10-06 NOTE — Patient Instructions (Addendum)
Impression/Recommendations:  Diverticulosis handout given to patient. Polyp handout given to patient.  Resume previous diet. Continue present medications.  Await pathology results.  Resume Plavix (clopidogrel) at prior dose in 5 days, October 11, 2018.  Repeat colonoscopy recommended for surveillance.  Date to be determined after pathology results reviewed.  YOU HAD AN ENDOSCOPIC PROCEDURE TODAY AT [facility]:   Refer to the procedure report that was given to you for any specific questions about what was found during the examination.  If the procedure report does not answer your questions, please call your gastroenterologist to clarify.  If you requested that your care partner not be given the details of your procedure findings, then the procedure report has been included in a sealed envelope for you to review at your convenience later.  YOU SHOULD EXPECT: Some feelings of bloating in the abdomen. Passage of more gas than usual.  Walking can help get rid of the air that was put into your GI tract during the procedure and reduce the bloating. If you had a lower endoscopy (such as a colonoscopy or flexible sigmoidoscopy) you may notice spotting of blood in your stool or on the toilet paper. If you underwent a bowel prep for your procedure, you may not have a normal bowel movement for a few days.  Please Note:  You might notice some irritation and congestion in your nose or some drainage.  This is from the oxygen used during your procedure.  There is no need for concern and it should clear up in a day or so.  SYMPTOMS TO REPORT IMMEDIATELY:   Following lower endoscopy (colonoscopy or flexible sigmoidoscopy):  Excessive amounts of blood in the stool  Significant tenderness or worsening of abdominal pains  Swelling of the abdomen that is new, acute  Fever of 100F or higher   For urgent or emergent issues, a gastroenterologist can be reached at any hour by calling [phone]  [phone].   DIET:  We do recommend a small meal at first, but then you may proceed to your regular diet.  Drink plenty of fluids but you should avoid alcoholic beverages for 24 hours.  ACTIVITY:  You should plan to take it easy for the rest of today and you should NOT DRIVE or use heavy machinery until tomorrow (because of the sedation medicines used during the test).    FOLLOW UP: Our staff will call the number listed on your records the next business day following your procedure to check on you and address any questions or concerns that you may have regarding the information given to you following your procedure. If we do not reach you, we will leave a message.  However, if you are feeling well and you are not experiencing any problems, there is no need to return our call.  We will assume that you have returned to your regular daily activities without incident.  If any biopsies were taken you will be contacted by phone or by letter within the next 1-3 weeks.  Please call us at [phone] if you have not heard about the biopsies in 3 weeks.    SIGNATURES/CONFIDENTIALITY: You and/or your care partner have signed paperwork which will be entered into your electronic medical record.  These signatures attest to the fact that that the information above on your After Visit Summary has been reviewed and is understood.  Full responsibility of the confidentiality of this discharge information lies with you and/or your care-partner.

## 2018-10-06 NOTE — Progress Notes (Signed)
Report given to PACU, vss 

## 2018-10-07 ENCOUNTER — Telehealth: Payer: Self-pay

## 2018-10-07 NOTE — Telephone Encounter (Signed)
  Follow up Call-  Call back number 10/06/2018  Post procedure Call Back phone  # [phone]  Permission to leave phone message Yes  Some recent data might be hidden     Patient questions:  Do you have a fever, pain , or abdominal swelling? No. Pain Score  0 *  Have you tolerated food without any problems? Yes.    Have you been able to return to your normal activities? Yes.    Do you have any questions about your discharge instructions: Diet   No. Medications  No. Follow up visit  No.  Do you have questions or concerns about your Care? No.  Actions: * If pain score is 4 or above: No action needed, pain <4.

## 2018-10-13 ENCOUNTER — Encounter: Payer: Self-pay | Admitting: Gastroenterology

## 2018-11-17 ENCOUNTER — Encounter: Payer: Self-pay | Admitting: Family Medicine

## 2018-12-03 ENCOUNTER — Other Ambulatory Visit: Payer: Self-pay | Admitting: Family Medicine

## 2018-12-03 DIAGNOSIS — F418 Other specified anxiety disorders: Secondary | ICD-10-CM

## 2018-12-09 NOTE — Progress Notes (Signed)
Virtual Visit via Video   Due to the COVID-19 pandemic, this visit was completed with telemedicine (audio/video) technology to reduce patient and provider exposure as well as to preserve personal protective equipment.   I connected with Todd Weeks on 12/10/18 at  7:40 AM EDT by a video enabled telemedicine application and verified that I am speaking with the correct person using two identifiers. Location patient: Home Location provider: [facility], Office Persons participating in the virtual visit: Todd, [place], DO Todd Weeks, CMA acting as scribe for Dr. Briscoe Weeks.   I discussed the limitations of evaluation and management by telemedicine and the availability of in person appointments. The patient expressed understanding and agreed to proceed.  Care Team   Patient Care Team: Todd Deutscher, DO as PCP - General (Family Medicine) Todd Blanks, MD as Consulting Physician (Cardiology) Todd Dutch, MD as Consulting Physician (Vascular Surgery) Todd Lever, MD as Consulting Physician (Pulmonary Disease) Todd Gallo, MD as Consulting Physician (Urology)  Subjective:   HPI: Patient in for follow up on phentermine. He will need refill sent in today. He is up a few pounds but has been eating more now that he is not working. He did cut his hand a few days ago with knife while cooking. His last TDAP was in 2016. He does have some numbness. He does have some swelling and redness. Reviewed care instructions with patient. His niece is a PA in the [facility] ED.  He is going to do a bowel cleanse and work on food choices and portions. He is also going to start drinking shakes to help with that. We have reviewed to make sure that the shakes do not have any stimulants in them.   Review of Systems  Constitutional: Positive for fever. Negative for chills.  HENT: Negative for hearing loss and tinnitus.   Eyes: Negative for blurred vision and double  vision.  Respiratory: Negative for cough.   Cardiovascular: Negative for chest pain and palpitations.  Gastrointestinal: Negative for heartburn and nausea.  Genitourinary: Negative for dysuria and urgency.  Musculoskeletal: Negative for myalgias.  Skin: Negative for rash.  Neurological: Negative for dizziness and headaches.  Endo/Heme/Allergies: Does not bruise/bleed easily.  Psychiatric/Behavioral: Positive for depression.       Taking Prozac daily.     Patient Active Problem List   Diagnosis Date Noted  . Depression, major, single episode, in partial remission ([place]) 08/04/2018  . Anxiety 06/12/2018  . Dyspnea 05/01/2018  . GERD (gastroesophageal reflux disease) 05/01/2018  . Insulin resistance 09/03/2017  . Numbness of right anterior thigh 01/21/2017  . Lesion or mass of paranasal sinuses 01/18/2016  . Obesity (BMI 30.0-34.9) 10/29/2015  . Subacromial bursitis 06/01/2015  . Hyperlipidemia 05/04/2015  . Atherosclerosis of native arteries of extremity with intermittent claudication ([place]) 05/22/2012  . CAD (coronary artery disease) 02/02/2011  . Abnormal cardiovascular function study 01/04/2011  . Chest pain 11/09/2010  . Elevated PSA 08/06/2007  . Former smoker 08/01/2007  . HTN (hypertension) 08/01/2007    Social History   Tobacco Use  . Smoking status: Former Smoker    Packs/day: 1.50    Years: 46.00    Pack years: 69.00    Types: Cigarettes    Last attempt to quit: 12/12/2011    Years since quitting: 7.0  . Smokeless tobacco: Never Used  Substance Use Topics  . Alcohol use: Yes    Alcohol/week: 1.0 standard drinks    Types: 1 Cans of  beer per week    Comment: a couple of beer on a weekend    Current Outpatient Medications:  .  ALPRAZolam (XANAX) 0.5 MG tablet, Take 1 tablet (0.5 mg total) by mouth 2 (two) times daily as needed for anxiety., Disp: 30 tablet, Rfl: 0 .  clopidogrel (PLAVIX) 75 MG tablet, TAKE (1) TABLET BY MOUTH ONCE DAILY., Disp: 90 tablet, Rfl:  2 .  famotidine (PEPCID) 20 MG tablet, One daily, Disp: 90 tablet, Rfl: 3 .  FLUoxetine (PROZAC) 20 MG capsule, Take 1 capsule (20 mg total) by mouth every morning., Disp: 90 capsule, Rfl: 3 .  irbesartan (AVAPRO) 150 MG tablet, Take 2 tablets (300 mg total) by mouth daily., Disp: 180 tablet, Rfl: 3 .  phentermine 37.5 MG capsule, Take 1 capsule (37.5 mg total) by mouth every morning., Disp: 30 capsule, Rfl: 2 .  simvastatin (ZOCOR) 20 MG tablet, TAKE 1 TABLET BY MOUTH DAILY AT 6 PM., Disp: 90 tablet, Rfl: 3  No Known Allergies  Objective:   VITALS: Per patient if applicable, see vitals. GENERAL: Alert, appears well and in no acute distress. HEENT: Atraumatic, conjunctiva clear, no obvious abnormalities on inspection of external nose and ears. NECK: Normal movements of the head and neck. CARDIOPULMONARY: No increased WOB. Speaking in clear sentences. I:E ratio WNL.  MS: Moves all visible extremities without noticeable abnormality. PSYCH: Pleasant and cooperative, well-groomed. Speech normal rate and rhythm. Affect is appropriate. Insight and judgement are appropriate. Attention is focused, linear, and appropriate.  NEURO: CN grossly intact. Oriented as arrived to appointment on time with no prompting. Moves both UE equally.  SKIN: No obvious lesions, wounds, erythema, or cyanosis noted on face or hands.  Depression screen [facility] 2/9 12/10/2018 08/04/2018 06/03/2018  Decreased Interest 0 0 3  Down, Depressed, Hopeless 1 1 3   PHQ - 2 Score 1 1 6   Altered sleeping 0 0 3  Tired, decreased energy 1 0 2  Change in appetite 2 1 3   Feeling bad or failure about yourself  0 0 1  Trouble concentrating 0 0 1  Moving slowly or fidgety/restless 0 0 2  Suicidal thoughts 0 0 0  PHQ-9 Score 4 2 18   Difficult doing work/chores Not difficult at all Not difficult at all Somewhat difficult    Assessment and Plan:   Todd Weeks was seen today for follow-up.  Diagnoses and all orders for this visit:  Weight  gain Comments: Discussed healthy eating patterns and exercise. Hx of tolerating phentermine, low risk stress test. Risk reviewed at length. Orders: -     phentermine 37.5 MG capsule; Take 1 capsule (37.5 mg total) by mouth every morning.  Laceration of left index finger without foreign body without damage to nail, sequela Comments: Clean and approximated. Red flags reviewed.   Cellulitis of left index finger Comments: Rx Abx. Red flags reviewed. Orders: -     doxycycline (VIBRA-TABS) 100 MG tablet; Take 1 tablet (100 mg total) by mouth 2 (two) times daily.  Educated About Covid-19 Virus Infection   . COVID-19 Education:The signs and symptoms of COVID-19 were discussed with the patient and how to seek care for testing if needed. The importance of social distancing was discussed today. . Reviewed expectations re: course of current medical issues. . Discussed self-management of symptoms. . Outlined signs and symptoms indicating need for more acute intervention. . Patient verbalized understanding and all questions were answered. Marland Kitchen Health Maintenance issues including appropriate healthy diet, exercise, and smoking avoidance were discussed with  patient. . See orders for this visit as documented in the electronic medical record.  Todd Deutscher, DO 12/10/2018  Records requested if needed. Time spent: 25 minutes, of which >50% was spent in obtaining information about his symptoms, reviewing his previous labs, evaluations, and treatments, counseling him about his condition (please see the discussed topics above), and developing a plan to further investigate it; he had a number of questions which I addressed.

## 2018-12-10 ENCOUNTER — Ambulatory Visit (INDEPENDENT_AMBULATORY_CARE_PROVIDER_SITE_OTHER): Payer: PPO | Admitting: Family Medicine

## 2018-12-10 ENCOUNTER — Encounter: Payer: Self-pay | Admitting: Family Medicine

## 2018-12-10 ENCOUNTER — Other Ambulatory Visit: Payer: Self-pay

## 2018-12-10 VITALS — Ht 72.0 in

## 2018-12-10 DIAGNOSIS — R635 Abnormal weight gain: Secondary | ICD-10-CM | POA: Diagnosis not present

## 2018-12-10 DIAGNOSIS — Z7189 Other specified counseling: Secondary | ICD-10-CM | POA: Diagnosis not present

## 2018-12-10 DIAGNOSIS — L03012 Cellulitis of left finger: Secondary | ICD-10-CM

## 2018-12-10 DIAGNOSIS — S61211S Laceration without foreign body of left index finger without damage to nail, sequela: Secondary | ICD-10-CM

## 2018-12-10 MED ORDER — PHENTERMINE HCL 37.5 MG PO CAPS: 37.5000 mg | ORAL_CAPSULE | ORAL | 2 refills | Status: DC

## 2018-12-10 MED ORDER — DOXYCYCLINE HYCLATE 100 MG PO TABS: 100.0000 mg | ORAL_TABLET | Freq: Two times a day (BID) | ORAL | 0 refills | Status: DC

## 2018-12-10 NOTE — Patient Instructions (Signed)
What is COVID-19? COVID-19 stands for "coronavirus disease 2019." It is caused by a virus called SARS-CoV-2. The virus first appeared in late 2019 and quickly spread around the world.  People with COVID-19 can have fever, cough, and trouble breathing. Problems with breathing happen when the infection affects the lungs and causes pneumonia.  Most people who get COVID-19 will not get severely ill. But some do. In many areas, leaders are telling people to stay home and away from other people. This is to try to slow the spread of the virus.  How is COVID-19 spread? The virus that causes COVID-19 mainly spreads from person to person. This usually happens when a sick person coughs, sneezes, or talks near other people. Doctors also think it is possible to get sick if you touch a surface that has the virus on it and then touch your mouth, nose, or eyes. This is similar to how the flu spreads, but the virus that causes COVID-19 spreads more easily.  From what experts know so far, the virus seems to spread most easily when people are showing symptoms. But people can spread it without having symptoms.  It is also possible for the virus to spread from a sick person to an animal, like a pet. But this seems to be uncommon. There is no evidence that a person could get the virus from a pet.  What are the symptoms of COVID-19? Symptoms usually start 4 or 5 days after a person is infected with the virus. But in some people, it can take up to 2 weeks for symptoms to appear. Some people never show symptoms at all.  When symptoms do happen, they can include:  . Fever . Dry cough . Feeling tired . Muscle aches . Trouble breathing  Although less common, some people have other symptoms, such as headache, sore throat, runny nose, or problems with their sense of smell or taste. Some have digestive problems like nausea or diarrhea.  For most people, symptoms will get better within a few weeks. But in others,  COVID-19 can lead to serious problems like pneumonia, not getting enough oxygen, heart problems, or even death. This is more common in people who are 65 years or older or have other health problems like heart disease, diabetes, lung disease, cancer, or obesity.  While children can get COVID-19, they are less likely to have severe symptoms.   What should I do if I have symptoms? If you have a fever, cough, or trouble breathing, call your doctor or nurse. They will ask about your symptoms. They might also ask about any recent travel and whether you have been around anyone who might be sick.  If your symptoms are not severe, it is best to call before you go in. They can tell you what to do and whether you need to be seen in person. Many people with only mild symptoms should stay home and avoid other people until they get better. If you do need to go to the clinic or hospital, cover your nose and mouth with cloth. This helps protect other people. The staff might also have you wait someplace away from other people.  If you are severely ill and need to go to the clinic or hospital right away, you should still call ahead. This way the staff can care for you while taking steps to protect others.  Is there a test for the virus that causes COVID-19? Yes. If your doctor or nurse suspects you have COVID-19, they   might take a swab from inside your nose, and possibly your mouth, and send it to a lab for testing. If you are coughing up mucus, they might also test a sample of the mucus. These tests can help your doctor figure out if you have COVID-19 or another illness.  In some areas, it might not be possible to test everyone who might have been exposed to the virus. If your doctor cannot test you, they might tell you to stay home, avoid other people, and call if your symptoms get worse.  There is also a blood test that can show if a person has had COVID-19 in the past. Over time, this could help experts understand  how many people were infected without knowing it. This test is not yet available everywhere. Experts are also using blood tests to study whether a person who has had COVID-19 could get it again.  How is COVID-19 treated? There is no known specific treatment for COVID-19. Many people will be able to stay home while they get better, but people with serious symptoms or other health problems might need to go to the hospital:  . Mild illness - Mild illness means you might have symptoms like fever and cough, but you do not have trouble breathing. Most people with COVID-19 have mild illness and can rest at home until they get better. This usually takes about 2 weeks, but it's not the same for everyone.  o If you are recovering from COVID-19, it's important to stay home and "self-isolate" until your doctor or nurse tells you it's safe to go back to your normal activities. Self-isolation means staying apart from other people, even the people you live with. When you can stop self-isolation will depend on how [place] it has been since you had symptoms, and in some cases, whether you have had a negative test (showing that the virus is no longer in your body).  . Severe illness - If you have more severe illness with trouble breathing, you might need to stay in the hospital, possibly in the intensive care unit (also called the "ICU"). While you are there, you will most likely be in a special isolation room. Only medical staff will be allowed in the room, and they will have to wear special gowns, gloves, masks, and eye protection. The doctors and nurses can monitor and support your breathing and other body functions and make you as comfortable as possible. You might need extra oxygen to help you breathe easily. If you are having a very hard time breathing, you might need to be put on a ventilator. This is a machine to help you breathe.  Can COVID-19 be prevented? There is not yet a vaccine to prevent COVID-19. But there  are things you can do to reduce your chances of getting it. These steps are a good idea for everyone, especially if you are in an area where the infection is spreading very quickly. But they are extra important for people age 65 years or older or who have other health problems. To help slow the spread of infection:  . Practice "social distancing." This means keeping people, even those who are healthy, away from each other. It is also sometimes called "physical distancing." The goal is to slow the spread of the virus that causes COVID-19.  . Wash your hands with soap and water often. This is especially important after being out in public. Make sure to rub your hands with soap for at least 20 seconds,   cleaning your wrists, fingernails, and in between your fingers. Then rinse your hands and dry them with a paper towel you can throw away. If you are not near a sink, you can use a hand sanitizing gel to clean your hands. The gels with at least 60 percent alcohol work the best. But it is better to wash with soap and water if you can.  . Avoid touching your face with your hands, especially your mouth, nose, or eyes.  . Avoid traveling if you can. Some experts recommend not traveling to or from certain areas where COVID-19 is spreading quickly. But any form of travel, especially if you spend time in crowded places like airports, increases your risk. If lots of people travel, it also makes it more likely that the virus will spread to more parts of the world.  What about face masks? When COVID-19 started to spread throughout the world, expert groups in the [place] did not recommend that most people wear a face mask for protection. That's because if healthy people buy a lot of medical masks, there won't be enough for the doctors and nurses who need them.  Washing your hands often and practicing social distancing are still the best ways to protect yourself and others. And experts still do not recommend that  people who are not health workers wear a medical mask. But the [place] Centers for Disease Control and Prevention (CDC) does now recommend covering your face when you need to leave your house. This is mostly so that if you are sick, even if you don't have any symptoms, you are less likely to spread the infection to other people. You can use cloth or a bandana to cover your mouth and nose. There are instructions online about how to make your own mask using fabric and rubber bands.  Even if you cover your face, it's still important to stay home except when you need to make necessary trips out, like for food or medicine. And be sure to stay at least 6 feet (2 meters) away from other people when you do leave your home.  When you take your face cover off, make sure you do not touch your eyes, nose, or mouth. And wash your hands after you touch the face cover. You can wash the face cover with the rest of your laundry.  What things are NOT recommended to prevent COVID-19? There are a lot of opinions about COVID-19, including rumors about how to avoid it. But not all of this information is accurate. For example, you might have heard that you can lower your risk by using a hand dryer, rinsing out your nose with salt water, or taking antibiotics. These things do not work. There is also no evidence that taking vitamins helps.  What should I do if someone in my home has COVID-19? If someone in your home has COVID-19, there are additional things you can do to protect yourself and others:  . Keep the sick person away from others - The sick person should stay in a separate room, and use a different bathroom if possible. They should also eat in their own room. . Experts also recommend that the person stay away from pets in the house until they are better. . Have them cover their face - The sick person should cover their nose and mouth with a cloth mask when they are in the same room as other people. If they  can't use a face cover, you can help   protect yourself by covering your face when you are in the room with them. . Wash hands - Wash your hands with soap and water often (see above). . Clean often - Here are some specific things that can help: . Wear disposable gloves when you clean. It's also a good idea to wear gloves when you have to touch the sick person's laundry, dishes, utensils, or trash. . When you do the sick person's laundry, avoid letting dirty clothes or bedding touch your body. Wash your hands and clean the outside of the washer after putting in the laundry. . Regularly clean things that are touched a lot. This includes counters, bedside tables, doorknobs, computers, phones, and bathroom surfaces. . Clean things in your home with soap and water, but also use disinfectants on appropriate surfaces. Some cleaning products work well to kill bacteria, but not viruses, so it's important to check labels. The [place] Environmental Protection Agency (EPA) has a list of products here: www.epa.gov/pesticide-registration/list-n-disinfectants-use-against-sars-cov-2.  What should I do if there is a COVID-19 outbreak in my area? The best thing you can do to stay healthy is to wash your hands often, avoid close contact with people who are sick, and stay home as much as possible (but especially if you are sick). In addition, to help slow the spread of disease, it's important to follow any official instructions in your area about limiting contact with other people. Even if there are no confirmed cases of COVID-19 where you live, that could change in the future.  What if I feel fine but think I was exposed? If you think you were in close contact with someone with COVID-19, but you don't have any symptoms, you should "self-quarantine" at home for at least 14 days. This means staying home as much as possible, and staying least 6 feet (2 meters) away from other people in your home. Self-quarantine is  slightly different from self-isolation, which is when a person who is sick stays in a completely separate room from others.  You should also monitor yourself for any symptoms. If you develop a fever, cough, or trouble breathing, call your doctor or nurse right away.  What if I am pregnant? More information about COVID-19 and pregnancy is available separately. (See "Patient education: Coronavirus disease 2019 (COVID-19) and pregnancy (The Basics)".)  If you are pregnant and you have questions about COVID-19, talk to your doctor, nurse, or midwife.  What can I do to cope with stress and anxiety? It's normal to feel anxious or worried about COVID-19. You can take care of yourself, and your family, by trying to:  . Take breaks from the news . Get regular exercise and eat healthy foods . Try to find activities that you enjoy and can do at home . Stay in touch with your friends and family members  Keep in mind that most people do not get severely ill from COVID-19. It helps to be prepared, and it's important to do what you can to lower your risk and help slow the spread of the virus. But try not to panic.  Where can I go to learn more? As we learn more about this virus, expert recommendations will continue to change. Check with your doctor or public health official to get the most updated information about how to protect yourself.  For information about COVID-19 in your area, you can call your local public health office. In the [place], this usually means your city or town's Board of Health. Many states   also have a "hotline" phone number you can call.  You can find more information about COVID-19 at the following websites: . [place] Centers for Disease Control and Prevention (CDC): www.cdc.gov/COVID19 . World Health Organization (WHO): www.who.int/emergencies/diseases/novel-coronavirus-2019  

## 2019-03-13 ENCOUNTER — Ambulatory Visit (INDEPENDENT_AMBULATORY_CARE_PROVIDER_SITE_OTHER): Payer: PPO | Admitting: Family Medicine

## 2019-03-13 ENCOUNTER — Other Ambulatory Visit: Payer: Self-pay

## 2019-03-13 ENCOUNTER — Encounter: Payer: Self-pay | Admitting: Family Medicine

## 2019-03-13 VITALS — Ht 72.0 in | Wt 238.6 lb

## 2019-03-13 DIAGNOSIS — F324 Major depressive disorder, single episode, in partial remission: Secondary | ICD-10-CM

## 2019-03-13 DIAGNOSIS — E669 Obesity, unspecified: Secondary | ICD-10-CM | POA: Diagnosis not present

## 2019-03-13 DIAGNOSIS — R3911 Hesitancy of micturition: Secondary | ICD-10-CM | POA: Diagnosis not present

## 2019-03-13 DIAGNOSIS — E8881 Metabolic syndrome: Secondary | ICD-10-CM

## 2019-03-13 DIAGNOSIS — K219 Gastro-esophageal reflux disease without esophagitis: Secondary | ICD-10-CM | POA: Diagnosis not present

## 2019-03-13 DIAGNOSIS — E88819 Insulin resistance, unspecified: Secondary | ICD-10-CM

## 2019-03-13 DIAGNOSIS — E66811 Obesity, class 1: Secondary | ICD-10-CM

## 2019-03-13 DIAGNOSIS — E782 Mixed hyperlipidemia: Secondary | ICD-10-CM

## 2019-03-13 DIAGNOSIS — F4321 Adjustment disorder with depressed mood: Secondary | ICD-10-CM

## 2019-03-13 NOTE — Progress Notes (Signed)
Virtual Visit via Video   Due to the COVID-19 pandemic, this visit was completed with telemedicine (audio/video) technology to reduce patient and provider exposure as well as to preserve personal protective equipment.   I connected with Todd Weeks by a video enabled telemedicine application and verified that I am speaking with the correct person using two identifiers. Location patient: Home Location provider: [facility], Office Persons participating in the virtual visit: Todd Weeks, [place], DO Todd Weeks, CMA acting as scribe for Dr. Briscoe Weeks.    I discussed the limitations of evaluation and management by telemedicine and the availability of in person appointments. The patient expressed understanding and agreed to proceed.  Care Team   Patient Care Team: Todd Deutscher, DO as PCP - General (Family Medicine) Todd Blanks, MD as Consulting Physician (Cardiology) Todd Dutch, MD as Consulting Physician (Vascular Surgery) Todd Lever, MD as Consulting Physician (Pulmonary Disease) Todd Gallo, MD as Consulting Physician (Urology)  Subjective:   HPI: Patient in to follow up on medications. He has stopped both Phentermine. He has not been able to exercise due to family stressors and time but has been working on [employer]. Reducing pastas and bread and increasing fish and salads.  Patient's friend of 72 years died in [place] and just after his brother died of an MI.  He and his siblings are working together to support each other.  He is sleeping well.  Working.  Review of Systems  Constitutional: Negative for chills and fever.  HENT: Negative for hearing loss and tinnitus.   Eyes: Negative for blurred vision and double vision.  Respiratory: Negative for cough and wheezing.   Cardiovascular: Negative for chest pain, palpitations and PND.  Gastrointestinal: Negative for nausea and vomiting.  Genitourinary: Negative for  dysuria, frequency and urgency.       Followed by urology   Musculoskeletal: Negative for myalgias.  Neurological: Negative for tingling and headaches.  Psychiatric/Behavioral: Negative for depression and suicidal ideas.    Patient Active Problem List   Diagnosis Date Noted  . Depression, major, single episode, in partial remission ([place]) 08/04/2018  . Anxiety 06/12/2018  . Dyspnea 05/01/2018  . GERD (gastroesophageal reflux disease) 05/01/2018  . Insulin resistance 09/03/2017  . Numbness of right anterior thigh 01/21/2017  . Lesion or mass of paranasal sinuses 01/18/2016  . Obesity (BMI 30.0-34.9) 10/29/2015  . Subacromial bursitis 06/01/2015  . Hyperlipidemia 05/04/2015  . Atherosclerosis of native arteries of extremity with intermittent claudication ([place]) 05/22/2012  . CAD (coronary artery disease) 02/02/2011  . Abnormal cardiovascular function study 01/04/2011  . Chest pain 11/09/2010  . Elevated PSA 08/06/2007  . Former smoker 08/01/2007  . HTN (hypertension) 08/01/2007    Social History   Tobacco Use  . Smoking status: Former Smoker    Packs/day: 1.50    Years: 46.00    Pack years: 69.00    Types: Cigarettes    Quit date: 12/12/2011    Years since quitting: 7.2  . Smokeless tobacco: Never Used  Substance Use Topics  . Alcohol use: Yes    Alcohol/week: 1.0 standard drinks    Types: 1 Cans of beer per week    Comment: a couple of beer on a weekend   Current Outpatient Medications:  .  ALPRAZolam (XANAX) 0.5 MG tablet, Take 1 tablet (0.5 mg total) by mouth 2 (two) times daily as needed for anxiety., Disp: 30 tablet, Rfl: 0 .  clopidogrel (PLAVIX) 75 MG tablet,  TAKE (1) TABLET BY MOUTH ONCE DAILY., Disp: 90 tablet, Rfl: 2 .  famotidine (PEPCID) 20 MG tablet, One daily, Disp: 90 tablet, Rfl: 3 .  FLUoxetine (PROZAC) 20 MG capsule, Take 1 capsule (20 mg total) by mouth every morning., Disp: 90 capsule, Rfl: 3 .  irbesartan (AVAPRO) 150 MG tablet, Take 2 tablets (300 mg  total) by mouth daily., Disp: 180 tablet, Rfl: 3 .  simvastatin (ZOCOR) 20 MG tablet, TAKE 1 TABLET BY MOUTH DAILY AT 6 PM., Disp: 90 tablet, Rfl: 3  No Known Allergies  Objective:   VITALS: Per patient if applicable, see vitals. GENERAL: Alert, appears well and in no acute distress. HEENT: Atraumatic, conjunctiva clear, no obvious abnormalities on inspection of external nose and ears. NECK: Normal movements of the head and neck. CARDIOPULMONARY: No increased WOB. Speaking in clear sentences. I:E ratio WNL.  MS: Moves all visible extremities without noticeable abnormality. PSYCH: Pleasant and cooperative, well-groomed. Speech normal rate and rhythm. Affect is appropriate. Insight and judgement are appropriate. Attention is focused, linear, and appropriate.  NEURO: CN grossly intact. Oriented as arrived to appointment on time with no prompting. Moves both UE equally.  SKIN: No obvious lesions, wounds, erythema, or cyanosis noted on face or hands.  Depression screen [facility] 2/9 03/13/2019 12/10/2018 08/04/2018  Decreased Interest 0 0 0  Down, Depressed, Hopeless 1 1 1   PHQ - 2 Score 1 1 1   Altered sleeping 0 0 0  Tired, decreased energy 0 1 0  Change in appetite 0 2 1  Feeling bad or failure about yourself  0 0 0  Trouble concentrating 0 0 0  Moving slowly or fidgety/restless 0 0 0  Suicidal thoughts 0 0 0  PHQ-9 Score 1 4 2   Difficult doing work/chores Not difficult at all Not difficult at all Not difficult at all    Assessment and Plan:   Todd Weeks was seen today for follow-up.  Diagnoses and all orders for this visit:  Mixed hyperlipidemia -     Comprehensive metabolic panel; Future -     Lipid panel; Future  Depression, major, single episode, in partial remission (HCC)  Insulin resistance -     Hemoglobin A1c; Future  Obesity (BMI 30.0-34.9)  Urinary hesitancy -     PSA; Future  Gastroesophageal reflux disease without esophagitis -     CBC with Differential/Platelet;  Future  Grief   . COVID-19 Education: The signs and symptoms of COVID-19 were discussed with the patient and how to seek care for testing if needed. The importance of social distancing was discussed today. . Reviewed expectations re: course of current medical issues. . Discussed self-management of symptoms. . Outlined signs and symptoms indicating need for more acute intervention. . Patient verbalized understanding and all questions were answered. Marland Kitchen Health Maintenance issues including appropriate healthy diet, exercise, and smoking avoidance were discussed with patient. . See orders for this visit as documented in the electronic medical record.  Todd Deutscher, DO  Records requested if needed. Time spent: 25 minutes, of which >50% was spent in obtaining information about his symptoms, reviewing his previous labs, evaluations, and treatments, counseling him about his condition (please see the discussed topics above), and developing a plan to further investigate it; he had a number of questions which I addressed.

## 2019-03-16 ENCOUNTER — Other Ambulatory Visit: Payer: Self-pay

## 2019-03-16 ENCOUNTER — Telehealth: Payer: Self-pay | Admitting: Family Medicine

## 2019-03-16 MED ORDER — TAMSULOSIN HCL 0.4 MG PO CAPS: 0.4000 mg | ORAL_CAPSULE | Freq: Every day | ORAL | 3 refills | Status: DC

## 2019-03-16 NOTE — Telephone Encounter (Signed)
Called in let patient know

## 2019-03-16 NOTE — Telephone Encounter (Signed)
I spoke with patient and he stated that there was discussion during his visit Friday that Dr. Juleen China was sending in something for his prostate.  I did not see a note on that in pt's chart.  Please advise.

## 2019-03-16 NOTE — Telephone Encounter (Signed)
Flomax 0.4 mg po daily.

## 2019-03-16 NOTE — Telephone Encounter (Signed)
See note  Copied from [place] [id number]. Topic: General - Inquiry >> Mar 16, 2019 12:16 PM Berneta Levins wrote: Reason for CRM:   Pt felt like on Friday a new medication was supposed to be called in for him, but his pharmacy doesn't have anything.  Pt isn't sure the name of the medication and would like for someone to look at his chart and see what he was supposed to get.

## 2019-03-23 ENCOUNTER — Other Ambulatory Visit: Payer: Self-pay

## 2019-03-23 ENCOUNTER — Other Ambulatory Visit (INDEPENDENT_AMBULATORY_CARE_PROVIDER_SITE_OTHER): Payer: PPO

## 2019-03-23 DIAGNOSIS — R3911 Hesitancy of micturition: Secondary | ICD-10-CM

## 2019-03-23 DIAGNOSIS — E8881 Metabolic syndrome: Secondary | ICD-10-CM

## 2019-03-23 DIAGNOSIS — K219 Gastro-esophageal reflux disease without esophagitis: Secondary | ICD-10-CM

## 2019-03-23 DIAGNOSIS — E782 Mixed hyperlipidemia: Secondary | ICD-10-CM | POA: Diagnosis not present

## 2019-03-23 LAB — COMPREHENSIVE METABOLIC PANEL
ALT: 29 U/L (ref 0–53)
AST: 18 U/L (ref 0–37)
Albumin: 4.6 g/dL (ref 3.5–5.2)
Alkaline Phosphatase: 63 U/L (ref 39–117)
BUN: 18 mg/dL (ref 6–23)
CO2: 26 mEq/L (ref 19–32)
Calcium: 9.2 mg/dL (ref 8.4–10.5)
Chloride: 103 mEq/L (ref 96–112)
Creatinine, Ser: 0.62 mg/dL (ref 0.40–1.50)
GFR: 130.4 mL/min (ref >60.00)
Glucose, Bld: 102 mg/dL — ABNORMAL HIGH (ref 70–99)
Potassium: 4 mEq/L (ref 3.5–5.1)
Sodium: 139 mEq/L (ref 135–145)
Total Bilirubin: 0.6 mg/dL (ref 0.2–1.2)
Total Protein: 6.8 g/dL (ref 6.0–8.3)

## 2019-03-23 LAB — CBC WITH DIFFERENTIAL/PLATELET
Basophils Absolute: 0 10*3/uL (ref 0.0–0.1)
Basophils Relative: 0.3 % (ref 0.0–3.0)
Eosinophils Absolute: 0.2 10*3/uL (ref 0.0–0.7)
Eosinophils Relative: 2.9 % (ref 0.0–5.0)
HCT: 43.8 % (ref 39.0–52.0)
Hemoglobin: 14.7 g/dL (ref 13.0–17.0)
Lymphocytes Relative: 22.5 % (ref 12.0–46.0)
Lymphs Abs: 1.6 10*3/uL (ref 0.7–4.0)
MCHC: 33.6 g/dL (ref 30.0–36.0)
MCV: 91.9 fl (ref 78.0–100.0)
Monocytes Absolute: 0.7 10*3/uL (ref 0.1–1.0)
Monocytes Relative: 9.5 % (ref 3.0–12.0)
Neutro Abs: 4.7 10*3/uL (ref 1.4–7.7)
Neutrophils Relative %: 64.8 % (ref 43.0–77.0)
Platelets: 232 10*3/uL (ref 150.0–400.0)
RBC: 4.76 Mil/uL (ref 4.22–5.81)
RDW: 13.3 % (ref 11.5–15.5)
WBC: 7.3 10*3/uL (ref 4.0–10.5)

## 2019-03-23 LAB — PSA: PSA: 35.83 ng/mL — ABNORMAL HIGH (ref 0.10–4.00)

## 2019-03-23 LAB — LIPID PANEL
Cholesterol: 166 mg/dL (ref 0–200)
HDL: 51.5 mg/dL (ref >39.00)
LDL Cholesterol: 86 mg/dL (ref 0–99)
NonHDL: 114.33
Total CHOL/HDL Ratio: 3
Triglycerides: 140 mg/dL (ref 0.0–149.0)
VLDL: 28 mg/dL (ref 0.0–40.0)

## 2019-03-23 LAB — HEMOGLOBIN A1C: Hgb A1c MFr Bld: 5.4 % (ref 4.6–6.5)

## 2019-03-24 ENCOUNTER — Other Ambulatory Visit: Payer: Self-pay

## 2019-03-24 DIAGNOSIS — R3911 Hesitancy of micturition: Secondary | ICD-10-CM

## 2019-03-25 ENCOUNTER — Telehealth: Payer: Self-pay

## 2019-03-25 NOTE — Telephone Encounter (Signed)
Have faxed labs and office note.

## 2019-03-25 NOTE — Telephone Encounter (Signed)
Sent patient my chart to let him know

## 2019-03-25 NOTE — Telephone Encounter (Signed)
Copied from [place] [id number]. Topic: General - Inquiry >> Mar 25, 2019  2:50 PM Todd Weeks, [place] wrote: Reason for CRM: Patient called in stating his urologist is needing his records and his PSA sent over before his appointment 8/21. Patient seeing Dr.Dahlstedt at [facility]. Please advise.

## 2019-04-29 DIAGNOSIS — R972 Elevated prostate specific antigen [PSA]: Secondary | ICD-10-CM | POA: Diagnosis not present

## 2019-04-30 ENCOUNTER — Other Ambulatory Visit: Payer: Self-pay | Admitting: Urology

## 2019-04-30 DIAGNOSIS — R972 Elevated prostate specific antigen [PSA]: Secondary | ICD-10-CM

## 2019-05-11 ENCOUNTER — Other Ambulatory Visit: Payer: Self-pay

## 2019-05-11 DIAGNOSIS — Z20822 Contact with and (suspected) exposure to covid-19: Secondary | ICD-10-CM

## 2019-05-11 DIAGNOSIS — R6889 Other general symptoms and signs: Secondary | ICD-10-CM | POA: Diagnosis not present

## 2019-05-12 LAB — NOVEL CORONAVIRUS, NAA: SARS-CoV-2, NAA: NOT DETECTED

## 2019-05-20 ENCOUNTER — Telehealth: Payer: Self-pay | Admitting: Family Medicine

## 2019-05-20 NOTE — Telephone Encounter (Signed)
Called pt to schedule appt due to patient experiencing infection in between toes and hand pain, no answer, LVM.

## 2019-05-25 ENCOUNTER — Other Ambulatory Visit: Payer: Self-pay

## 2019-05-25 ENCOUNTER — Ambulatory Visit
Admission: RE | Admit: 2019-05-25 | Discharge: 2019-05-25 | Disposition: A | Discharge status: Home / Self Care | Payer: PPO | Source: Ambulatory Visit | Attending: Urology | Admitting: Urology

## 2019-05-25 DIAGNOSIS — R972 Elevated prostate specific antigen [PSA]: Secondary | ICD-10-CM

## 2019-05-25 DIAGNOSIS — N400 Enlarged prostate without lower urinary tract symptoms: Secondary | ICD-10-CM | POA: Diagnosis not present

## 2019-05-25 MED ORDER — GADOBENATE DIMEGLUMINE 529 MG/ML IV SOLN
20.0000 mL | Freq: Once | INTRAVENOUS | Status: AC | PRN
  Administered 2019-05-25: 20 mL via INTRAVENOUS

## 2019-05-27 ENCOUNTER — Telehealth: Payer: Self-pay | Admitting: Family Medicine

## 2019-05-27 NOTE — Telephone Encounter (Signed)
I left a message asking the patient to call and schedule Medicare AWV with Courtney ([facility] Coach).  If patient calls back, please schedule Medicare Wellness Visit at next available opening.  VDM (Dee-Dee) °

## 2019-06-03 ENCOUNTER — Other Ambulatory Visit: Payer: Self-pay

## 2019-06-03 ENCOUNTER — Ambulatory Visit (INDEPENDENT_AMBULATORY_CARE_PROVIDER_SITE_OTHER): Payer: PPO

## 2019-06-03 DIAGNOSIS — Z0000 Encounter for general adult medical examination without abnormal findings: Secondary | ICD-10-CM | POA: Diagnosis not present

## 2019-06-03 NOTE — Progress Notes (Signed)
I have reviewed documentation for AWV and Advance Care planning provided by Health Coach, I agree with documentation, I was immediately available for any questions. Morey Andonian, DO   

## 2019-06-03 NOTE — Progress Notes (Signed)
This visit is being conducted via phone call due to the COVID-19 pandemic. This patient has given me verbal consent via phone to conduct this visit, patient states they are participating from their home address. Some vital signs may be absent or patient reported.   Patient identification: identified by name, DOB, and current address.   Subjective:   Todd Weeks is a 63 y.o. male who presents for Medicare Annual/Subsequent preventive examination.  Review of Systems:   Cardiac Risk Factors include: advanced age (>41men, >67 women);dyslipidemia;hypertension     Objective:    Vitals: There were no vitals taken for this visit.  There is no height or weight on file to calculate BMI.  Advanced Directives 06/03/2019 04/26/2018 04/25/2018 08/12/2017 04/09/2017 08/02/2016 08/11/2015  Does Patient Have a Medical Advance Directive? No - No No No No No  Would patient like information on creating a medical advance directive? Yes (MAU/Ambulatory/Procedural Areas - Information given) No - Patient declined - Yes (MAU/Ambulatory/Procedural Areas - Information given) No - Patient declined No - Patient declined No - patient declined information    Tobacco Social History   Tobacco Use  Smoking Status Former Smoker  . Packs/day: 1.50  . Years: 46.00  . Pack years: 69.00  . Types: Cigarettes  . Quit date: 12/12/2011  . Years since quitting: 7.4  Smokeless Tobacco Never Used     Counseling given: Not Answered   Clinical Intake:  Pre-visit preparation completed: Yes  Pain : No/denies pain  Diabetes: No  How often do you need to have someone help you when you read instructions, pamphlets, or other written materials from your doctor or pharmacy?: 1 - Never  Interpreter Needed?: No  Information entered by :: Denman George LPN  Past Medical History:  Diagnosis Date  . Bell's palsy   . Closed nondisplaced fracture of neck of third metacarpal bone of right hand 02/04/2017  . Clotting disorder  ([place])   . Depression   . Diverticulitis   . GERD (gastroesophageal reflux disease)   . Headache(784.0)   . HTN (hypertension)   . Hyperlipidemia   . PVD (peripheral vascular disease) ([place])   . Sexual dysfunction    Past Surgical History:  Procedure Laterality Date  . PROSTATE BIOPSY  2015   Family History  Problem Relation Age of Onset  . Hypertension Mother   . Deep vein thrombosis Father   . Diabetes Brother   . Tuberculosis Maternal Grandfather   . Emphysema Maternal Grandfather   . Lung disease Brother   . Asthma Son   . Rectal cancer Neg Hx   . Colon cancer Neg Hx    Social History   Socioeconomic History  . Marital status: Single    Spouse name: Not on file  . Number of children: 2  . Years of education: 15  . Highest education level: Not on file  Occupational History  . Occupation: Electrical engineer  . Occupation: Unemployed    Comment: disability  Social Needs  . Financial resource strain: Not on file  . Food insecurity    Worry: Not on file    Inability: Not on file  . Transportation needs    Medical: Not on file    Non-medical: Not on file  Tobacco Use  . Smoking status: Former Smoker    Packs/day: 1.50    Years: 46.00    Pack years: 69.00    Types: Cigarettes    Quit date: 12/12/2011    Years since quitting:  7.4  . Smokeless tobacco: Never Used  Substance and Sexual Activity  . Alcohol use: Yes    Alcohol/week: 1.0 standard drinks    Types: 1 Cans of beer per week    Comment: a couple of beer on a weekend  . Drug use: Yes    Types: Cocaine, Marijuana, "Crack" cocaine    Comment: november  . Sexual activity: Not on file  Lifestyle  . Physical activity    Days per week: Not on file    Minutes per session: Not on file  . Stress: Not on file  Relationships  . Social Herbalist on phone: Not on file    Gets together: Not on file    Attends religious service: Not on file    Active member of club or organization: Not on file     Attends meetings of clubs or organizations: Not on file    Relationship status: Not on file  Other Topics Concern  . Not on file  Social History Narrative   Raised in [place], [place]. Does not have any religious beliefs that would effect healthcare. Lives in house with sister. Likes to ride motorcycle for fun.    Moving out on his own; in the next couple of weeks will be moving back home that he had rented      [place]; with a helmet    Outpatient Encounter Medications as of 06/03/2019  Medication Sig  . ALPRAZolam (XANAX) 0.5 MG tablet Take 1 tablet (0.5 mg total) by mouth 2 (two) times daily as needed for anxiety.  . clopidogrel (PLAVIX) 75 MG tablet TAKE (1) TABLET BY MOUTH ONCE DAILY.  . famotidine (PEPCID) 20 MG tablet One daily  . FLUoxetine (PROZAC) 20 MG capsule Take 1 capsule (20 mg total) by mouth every morning.  . irbesartan (AVAPRO) 150 MG tablet Take 2 tablets (300 mg total) by mouth daily.  . tamsulosin (FLOMAX) 0.4 MG CAPS capsule Take 1 capsule (0.4 mg total) by mouth daily.  . phentermine 37.5 MG capsule Take 1 capsule (37.5 mg total) by mouth every morning. (Patient not taking: Reported on 03/13/2019)  . simvastatin (ZOCOR) 20 MG tablet TAKE 1 TABLET BY MOUTH DAILY AT 6 PM. (Patient not taking: Reported on 03/13/2019)   Facility-Administered Encounter Medications as of 06/03/2019  Medication  . 0.9 %  sodium chloride infusion    Activities of Daily Living In your present state of health, do you have any difficulty performing the following activities: 06/03/2019  Hearing? N  Vision? N  Difficulty concentrating or making decisions? N  Walking or climbing stairs? N  Dressing or bathing? N  Doing errands, shopping? N  Preparing Food and eating ? N  Using the Toilet? N  In the past six months, have you accidently leaked urine? N  Do you have problems with loss of bowel control? N  Managing your Medications? N  Managing your Finances? N  Housekeeping or managing  your Housekeeping? N  Some recent data might be hidden    Patient Care Team: Briscoe Deutscher, DO as PCP - General (Family Medicine) Burnell Blanks, MD as Consulting Physician (Cardiology) Elam Dutch, MD as Consulting Physician (Vascular Surgery) Deneise Lever, MD as Consulting Physician (Pulmonary Disease) Franchot Gallo, MD as Consulting Physician (Urology)   Assessment:   This is a routine wellness examination for [place].  Exercise Activities and Dietary recommendations    Goals    . Weight (lb) < 260 lb (117.9  kg)     Would like to decrease weight to ultimate goal of 220 by increasing activity (joining [employer]).  Increase mobility.        Fall Risk Fall Risk  06/03/2019 03/13/2019 12/10/2018 08/12/2017 01/21/2017  Falls in the past year? 0 0 0 No No  Number falls in past yr: - 0 0 - -  Injury with Fall? 0 0 0 - -  Follow up Education provided;Falls prevention discussed;Falls evaluation completed - - - -   Is the patient's home free of loose throw rugs in walkways, pet beds, electrical cords, etc?   yes      Grab bars in the bathroom? yes      Handrails on the stairs?   yes      Adequate lighting?   yes   Depression Screen PHQ 2/9 Scores 06/03/2019 03/13/2019 12/10/2018 08/04/2018  PHQ - 2 Score 0 1 1 1   PHQ- 9 Score - 1 4 2     Cognitive Function-no cognitive concerns at this time   Alert? Yes         Normal Appearance? N/a  Oriented to person? Yes           Place? Yes  Time? Yes  Recall of three objects? Yes  Can perform simple calculations? Yes  Displays appropriate judgment? Yes  Can read the correct time from a watch face? Yes   MMSE - Mini Mental State Exam 12/13/2014  Not completed: Unable to complete        Immunization History  Administered Date(s) Administered  . Influenza Split 05/14/2015  . Influenza, High Dose Seasonal PF 04/26/2019  . Influenza,inj,Quad PF,6+ Mos 08/02/2016, 06/16/2017, 06/12/2018  . Influenza-Unspecified  06/16/2017  . Pneumococcal Polysaccharide-23 08/04/2018  . Tdap 12/13/2014    Qualifies for Shingles Vaccine? Discussed and patient will check with pharmacy for coverage.  Patient education handout provided   Screening Tests Health Maintenance  Topic Date Due  . COLONOSCOPY  10/07/2023  . TETANUS/TDAP  12/12/2024  . INFLUENZA VACCINE  Completed  . Hepatitis C Screening  Completed  . HIV Screening  Completed   Cancer Screenings: Lung: Low Dose CT Chest recommended if Age 29-80 years, 30 pack-year currently smoking OR have quit w/in 15years. Patient does qualify. (followed by pulmonology)  Colorectal: colonoscopy 10/06/18 with Dr. Loletha Carrow    Plan:  I have personally reviewed and addressed the Medicare Annual Wellness questionnaire and have noted the following in the patient's chart:  A. Medical and social history B. Use of alcohol, tobacco or illicit drugs  C. Current medications and supplements D. Functional ability and status E.  Nutritional status F.  Physical activity G. Advance directives H. List of other physicians I.  Hospitalizations, surgeries, and ER visits in previous 12 months J.  [place] such as hearing and vision if needed, cognitive and depression L. Referrals, records requested, and appointments- none   In addition, I have reviewed and discussed with patient certain preventive protocols, quality metrics, and best practice recommendations. A written personalized care plan for preventive services as well as general preventive health recommendations were provided to patient.   Signed,  Denman George, LPN  Nurse Health Advisor   Nurse Notes: no additional

## 2019-06-03 NOTE — Patient Instructions (Signed)
Todd Weeks , Thank you for taking time to come for your Medicare Wellness Visit. I appreciate your ongoing commitment to your health goals. Please review the following plan we discussed and let me know if I can assist you in the future.   Screening recommendations/referrals: Colorectal Screening: up to date; last 10/06/18 with Dr. Loletha Carrow   Vision and Dental Exams: Recommended annual ophthalmology exams for early detection of glaucoma and other disorders of the eye Recommended annual dental exams for proper oral hygiene  Vaccinations: Influenza vaccine: completed 04/26/19 Pneumococcal vaccine: up to date; last 08/04/18 Tdap vaccine: up to date; last 12/13/14  Shingles vaccine: Please call your insurance company to determine your out of pocket expense for the Shingrix vaccine. You may receive this vaccine at your local pharmacy.  Advanced directives: Advance directives discussed with you today. I have provided a copy for you to complete at home and have notarized. Once this is complete please bring a copy in to our office so we can scan it into your chart.  Goals: Recommend to drink at least 6-8 8oz glasses of water per day and consume a balanced diet rich in fresh fruits and vegetables.   Next appointment: Please schedule your Annual Wellness Visit with your Nurse Health Advisor in one year.  Preventive Care 40-64 Years, Male Preventive care refers to lifestyle choices and visits with your health care provider that can promote health and wellness. What does preventive care include?  A yearly physical exam. This is also called an annual well check.  Dental exams once or twice a year.  Routine eye exams. Ask your health care provider how often you should have your eyes checked.  Personal lifestyle choices, including:  Daily care of your teeth and gums.  Regular physical activity.  Eating a healthy diet.  Avoiding tobacco and drug use.  Limiting alcohol use.  Practicing safe sex.   Taking low-dose aspirin every day starting at age 47 if recommended by your health care provider.  Taking vitamin and mineral supplements as recommended by your health care provider. What happens during an annual well check? The services and screenings done by your health care provider during your annual well check will depend on your age, overall health, lifestyle risk factors, and family history of disease. Counseling  Your health care provider may ask you questions about your:  Alcohol use.  Tobacco use.  Drug use.  Emotional well-being.  Home and relationship well-being.  Sexual activity.  Eating habits.  Work and work Statistician. Screening  You may have the following tests or measurements:  Height, weight, and BMI.  Blood pressure.  Lipid and cholesterol levels. These may be checked every 5 years, or more frequently if you are over 35 years old.  Skin check.  Lung cancer screening. You may have this screening every year starting at age 62 if you have a 30-pack-year history of smoking and currently smoke or have quit within the past 15 years.  Fecal occult blood test (FOBT) of the stool. You may have this test every year starting at age 59.  Flexible sigmoidoscopy or colonoscopy. You may have a sigmoidoscopy every 5 years or a colonoscopy every 10 years starting at age 68.  Prostate cancer screening. Recommendations will vary depending on your family history and other risks.  Hepatitis C blood test.  Hepatitis B blood test.  Sexually transmitted disease (STD) testing.  Diabetes screening. This is done by checking your blood sugar (glucose) after you have not eaten  for a while (fasting). You may have this done every 1-3 years. Discuss your test results, treatment options, and if necessary, the need for more tests with your health care provider. Vaccines  Your health care provider may recommend certain vaccines, such as:  Influenza vaccine. This is recommended  every year.  Tetanus, diphtheria, and acellular pertussis (Tdap, Td) vaccine. You may need a Td booster every 10 years.  Zoster vaccine. You may need this after age 84.  Pneumococcal 13-valent conjugate (PCV13) vaccine. You may need this if you have certain conditions and have not been vaccinated.  Pneumococcal polysaccharide (PPSV23) vaccine. You may need one or two doses if you smoke cigarettes or if you have certain conditions. Talk to your health care provider about which screenings and vaccines you need and how often you need them. This information is not intended to replace advice given to you by your health care provider. Make sure you discuss any questions you have with your health care provider. Document Released: 08/26/2015 Document Revised: 04/18/2016 Document Reviewed: 05/31/2015 Elsevier Interactive Patient Education  2017 [place] Prevention in the Home Falls can cause injuries. They can happen to people of all ages. There are many things you can do to make your home safe and to help prevent falls. What can I do on the outside of my home?  Regularly fix the edges of walkways and driveways and fix any cracks.  Remove anything that might make you trip as you walk through a door, such as a raised step or threshold.  Trim any bushes or trees on the path to your home.  Use bright outdoor lighting.  Clear any walking paths of anything that might make someone trip, such as rocks or tools.  Regularly check to see if handrails are loose or broken. Make sure that both sides of any steps have handrails.  Any raised decks and porches should have guardrails on the edges.  Have any leaves, snow, or ice cleared regularly.  Use sand or salt on walking paths during winter.  Clean up any spills in your garage right away. This includes oil or grease spills. What can I do in the bathroom?  Use night lights.  Install grab bars by the toilet and in the tub and shower. Do  not use towel bars as grab bars.  Use non-skid mats or decals in the tub or shower.  If you need to sit down in the shower, use a plastic, non-slip stool.  Keep the floor dry. Clean up any water that spills on the floor as soon as it happens.  Remove soap buildup in the tub or shower regularly.  Attach bath mats securely with double-sided non-slip rug tape.  Do not have throw rugs and other things on the floor that can make you trip. What can I do in the bedroom?  Use night lights.  Make sure that you have a light by your bed that is easy to reach.  Do not use any sheets or blankets that are too big for your bed. They should not hang down onto the floor.  Have a firm chair that has side arms. You can use this for support while you get dressed.  Do not have throw rugs and other things on the floor that can make you trip. What can I do in the kitchen?  Clean up any spills right away.  Avoid walking on wet floors.  Keep items that you use a lot in easy-to-reach places.  If you need to reach something above you, use a strong step stool that has a grab bar.  Keep electrical cords out of the way.  Do not use floor polish or wax that makes floors slippery. If you must use wax, use non-skid floor wax.  Do not have throw rugs and other things on the floor that can make you trip. What can I do with my stairs?  Do not leave any items on the stairs.  Make sure that there are handrails on both sides of the stairs and use them. Fix handrails that are broken or loose. Make sure that handrails are as [place] as the stairways.  Check any carpeting to make sure that it is firmly attached to the stairs. Fix any carpet that is loose or worn.  Avoid having throw rugs at the top or bottom of the stairs. If you do have throw rugs, attach them to the floor with carpet tape.  Make sure that you have a light switch at the top of the stairs and the bottom of the stairs. If you do not have them,  ask someone to add them for you. What else can I do to help prevent falls?  Wear shoes that:  Do not have high heels.  Have rubber bottoms.  Are comfortable and fit you well.  Are closed at the toe. Do not wear sandals.  If you use a stepladder:  Make sure that it is fully opened. Do not climb a closed stepladder.  Make sure that both sides of the stepladder are locked into place.  Ask someone to hold it for you, if possible.  Clearly mark and make sure that you can see:  Any grab bars or handrails.  First and last steps.  Where the edge of each step is.  Use tools that help you move around (mobility aids) if they are needed. These include:  Canes.  Walkers.  Scooters.  Crutches.  Turn on the lights when you go into a dark area. Replace any light bulbs as soon as they burn out.  Set up your furniture so you have a clear path. Avoid moving your furniture around.  If any of your floors are uneven, fix them.  If there are any pets around you, be aware of where they are.  Review your medicines with your doctor. Some medicines can make you feel dizzy. This can increase your chance of falling. Ask your doctor what other things that you can do to help prevent falls. This information is not intended to replace advice given to you by your health care provider. Make sure you discuss any questions you have with your health care provider. Document Released: 05/26/2009 Document Revised: 01/05/2016 Document Reviewed: 09/03/2014 Elsevier Interactive Patient Education  2017 [employer].

## 2019-06-11 ENCOUNTER — Other Ambulatory Visit: Payer: Self-pay | Admitting: Family Medicine

## 2019-06-11 DIAGNOSIS — F418 Other specified anxiety disorders: Secondary | ICD-10-CM

## 2019-06-11 DIAGNOSIS — I2510 Atherosclerotic heart disease of native coronary artery without angina pectoris: Secondary | ICD-10-CM

## 2019-06-11 NOTE — Progress Notes (Signed)
My chart message sent to patient with information.

## 2019-06-15 ENCOUNTER — Other Ambulatory Visit: Payer: Self-pay

## 2019-06-15 DIAGNOSIS — Z20822 Contact with and (suspected) exposure to covid-19: Secondary | ICD-10-CM

## 2019-06-16 LAB — NOVEL CORONAVIRUS, NAA: SARS-CoV-2, NAA: DETECTED — AB

## 2019-06-29 ENCOUNTER — Encounter: Payer: PPO | Admitting: Family Medicine

## 2019-06-29 ENCOUNTER — Other Ambulatory Visit: Payer: Self-pay | Admitting: *Deleted

## 2019-06-29 DIAGNOSIS — Z20822 Contact with and (suspected) exposure to covid-19: Secondary | ICD-10-CM

## 2019-07-01 LAB — NOVEL CORONAVIRUS, NAA: SARS-CoV-2, NAA: DETECTED — AB

## 2019-07-27 ENCOUNTER — Other Ambulatory Visit: Payer: Self-pay

## 2019-07-27 DIAGNOSIS — Z20822 Contact with and (suspected) exposure to covid-19: Secondary | ICD-10-CM

## 2019-07-29 LAB — NOVEL CORONAVIRUS, NAA: SARS-CoV-2, NAA: NOT DETECTED

## 2019-08-10 ENCOUNTER — Ambulatory Visit (INDEPENDENT_AMBULATORY_CARE_PROVIDER_SITE_OTHER)
Admission: RE | Admit: 2019-08-10 | Discharge: 2019-08-10 | Disposition: A | Discharge status: Home / Self Care | Payer: PPO | Source: Ambulatory Visit | Attending: Acute Care | Admitting: Acute Care

## 2019-08-10 ENCOUNTER — Other Ambulatory Visit: Payer: Self-pay

## 2019-08-10 DIAGNOSIS — Z87891 Personal history of nicotine dependence: Secondary | ICD-10-CM | POA: Diagnosis not present

## 2019-08-10 DIAGNOSIS — Z122 Encounter for screening for malignant neoplasm of respiratory organs: Secondary | ICD-10-CM

## 2019-08-17 ENCOUNTER — Other Ambulatory Visit: Payer: Self-pay | Admitting: Family Medicine

## 2019-08-17 DIAGNOSIS — I10 Essential (primary) hypertension: Secondary | ICD-10-CM

## 2019-08-17 NOTE — Telephone Encounter (Signed)
Pt called in to follow up on refill request. Pt says that he is out of his medication irbesartan (AVAPRO) 150 MG tablet , pt is scheduled to see Dr. Jerline Pain, pt would like to know if provider would complete refill for him?   Pharmacy:  [facility] [facility], [place] Phone:  [phone]  Fax:  [phone]

## 2019-08-20 ENCOUNTER — Other Ambulatory Visit: Payer: Self-pay | Admitting: *Deleted

## 2019-08-20 DIAGNOSIS — Z87891 Personal history of nicotine dependence: Secondary | ICD-10-CM

## 2019-08-21 ENCOUNTER — Ambulatory Visit (INDEPENDENT_AMBULATORY_CARE_PROVIDER_SITE_OTHER): Payer: PPO | Admitting: Family Medicine

## 2019-08-21 VITALS — BP 138/80 | HR 69 | Temp 98.0°F | Ht 72.0 in | Wt 266.2 lb

## 2019-08-21 DIAGNOSIS — I2510 Atherosclerotic heart disease of native coronary artery without angina pectoris: Secondary | ICD-10-CM

## 2019-08-21 DIAGNOSIS — I10 Essential (primary) hypertension: Secondary | ICD-10-CM

## 2019-08-21 DIAGNOSIS — E782 Mixed hyperlipidemia: Secondary | ICD-10-CM | POA: Diagnosis not present

## 2019-08-21 DIAGNOSIS — K219 Gastro-esophageal reflux disease without esophagitis: Secondary | ICD-10-CM | POA: Diagnosis not present

## 2019-08-21 DIAGNOSIS — M79671 Pain in right foot: Secondary | ICD-10-CM

## 2019-08-21 DIAGNOSIS — N400 Enlarged prostate without lower urinary tract symptoms: Secondary | ICD-10-CM | POA: Diagnosis not present

## 2019-08-21 DIAGNOSIS — F419 Anxiety disorder, unspecified: Secondary | ICD-10-CM | POA: Diagnosis not present

## 2019-08-21 DIAGNOSIS — F324 Major depressive disorder, single episode, in partial remission: Secondary | ICD-10-CM | POA: Diagnosis not present

## 2019-08-21 DIAGNOSIS — M79672 Pain in left foot: Secondary | ICD-10-CM | POA: Diagnosis not present

## 2019-08-21 MED ORDER — ATORVASTATIN CALCIUM 40 MG PO TABS: 40.0000 mg | ORAL_TABLET | Freq: Every day | ORAL | 3 refills | Status: DC

## 2019-08-21 MED ORDER — IRBESARTAN 300 MG PO TABS: 300.0000 mg | ORAL_TABLET | Freq: Every day | ORAL | 3 refills | Status: DC

## 2019-08-21 NOTE — Patient Instructions (Signed)
It was very nice to see you today!  Please stop your simvastatin.  Start atorvastatin.  I will refill your blood pressure medication today.  We will place referral for you to see the foot doctor.  Please call to schedule appointment with your cardiologist soon.  Please come back to see me in August for your annual checkup with blood work.  Please come back to see me sooner if needed.  Take care, Dr Jerline Pain  Please try these tips to maintain a healthy lifestyle:   Eat at least 3 REAL meals and 1-2 snacks per day.  Aim for no more than 5 hours between eating.  If you eat breakfast, please do so within one hour of getting up.    Each meal should contain half fruits/vegetables, one quarter protein, and one quarter carbs (no bigger than a computer mouse)   Cut down on sweet beverages. This includes juice, soda, and sweet tea.     Drink at least 1 glass of water with each meal and aim for at least 8 glasses per day   Exercise at least 150 minutes every week.

## 2019-08-21 NOTE — Assessment & Plan Note (Signed)
Found to have three-vessel calcification on lung CT scan last month.  We will make switch to atorvastatin 40 mg daily as above.  Advised him to follow-up with cardiology soon.  He will continue Plavix 75 mg daily and irbesartan 300 mg daily.

## 2019-08-21 NOTE — Assessment & Plan Note (Signed)
Stable.  Continue Prozac 20 mg daily. 

## 2019-08-21 NOTE — Assessment & Plan Note (Signed)
We will change simvastatin 20 mg daily to atorvastatin 40 mg daily.  Check lipid panel later this year with next blood draw.

## 2019-08-21 NOTE — Assessment & Plan Note (Signed)
Stable.  Continue Flomax 0.4 mg daily per urology.

## 2019-08-21 NOTE — Assessment & Plan Note (Signed)
Stable.  Continue Pepcid 20mg daily

## 2019-08-21 NOTE — Progress Notes (Signed)
   Todd Weeks is a 64 y.o. male who presents today for an office visit.  Assessment/Plan:  Chronic Problems Addressed Today: Hyperlipidemia We will change simvastatin 20 mg daily to atorvastatin 40 mg daily.  Check lipid panel later this year with next blood draw.  CAD (coronary artery disease) Found to have three-vessel calcification on lung CT scan last month.  We will make switch to atorvastatin 40 mg daily as above.  Advised him to follow-up with cardiology soon.  He will continue Plavix 75 mg daily and irbesartan 300 mg daily.  Depression, major, single episode, in partial remission (HCC) Stable.  Continue Prozac 20 mg daily.  Anxiety Stable.  Continue Prozac 20 mg daily.  GERD (gastroesophageal reflux disease) Stable.  Continue Pepcid 20 mg daily.  BPH (benign prostatic hyperplasia) Stable.  Continue Flomax 0.4 mg daily per urology.     Subjective:  HPI:  Patient here today to transfer care.  Was previously with Dr. Juleen China.  Has been doing well however, recently lost his brother due to heart attack.  Patient had CAT scan done last month which showed coronary artery calcifications.  He would like to know for the ways to prevent heart attack and coronary artery disease.  He will be starting a new diet soon.  His stable, chronic medical conditions are outlined below:  # CAD / Hypertension / Dyslipidemia - Follows with cardiology - On simvastatin 20 mg daily - On Plavix 75 mg daily - On irbesartan 300 mg daily -Tolerating medication regimen well without reported side effects. - ROS: No reported chest pain or shortness of breath  # Depression / Anxiety - On prozac 20mg  daily and tolerating well  # GERD - On pepcid 20mg  daily and tolerating well.   # BPH / Elevated PSA - Follows with urology - On flomax 0.4mg  daily and tolerating well       Objective:  Physical Exam: BP 138/80   Pulse 69   Temp 98 F (36.7 C)   Ht 6' (1.829 m)   Wt 266 lb 4 oz (120.8 kg)    SpO2 96%   BMI 36.11 kg/m   Wt Readings from Last 3 Encounters:  08/21/19 266 lb 4 oz (120.8 kg)  03/13/19 238 lb 9.6 oz (108.2 kg)  10/06/18 233 lb (105.7 kg)  Gen: No acute distress, resting comfortably CV: Regular rate and rhythm with no murmurs appreciated Pulm: Normal work of breathing, clear to auscultation bilaterally with no crackles, wheezes, or rhonchi Neuro: Grossly normal, moves all extremities Psych: Normal affect and thought content  Time Spent: 45 minutes of total time was spent on the date of the encounter performing the following actions: chart review prior to seeing the patient, obtaining history, performing a medically necessary exam, counseling on the treatment plan, placing orders, and documenting in our EHR.        [name]. Jerline Pain, MD 08/21/2019 1:03 PM

## 2019-09-14 ENCOUNTER — Other Ambulatory Visit: Payer: Self-pay | Admitting: Podiatry

## 2019-09-14 ENCOUNTER — Ambulatory Visit: Payer: PPO | Admitting: Podiatry

## 2019-09-14 ENCOUNTER — Encounter: Payer: Self-pay | Admitting: Podiatry

## 2019-09-14 ENCOUNTER — Ambulatory Visit (INDEPENDENT_AMBULATORY_CARE_PROVIDER_SITE_OTHER): Payer: PPO

## 2019-09-14 ENCOUNTER — Other Ambulatory Visit: Payer: Self-pay

## 2019-09-14 DIAGNOSIS — M779 Enthesopathy, unspecified: Secondary | ICD-10-CM

## 2019-09-14 DIAGNOSIS — Q828 Other specified congenital malformations of skin: Secondary | ICD-10-CM | POA: Diagnosis not present

## 2019-09-14 DIAGNOSIS — M79671 Pain in right foot: Secondary | ICD-10-CM

## 2019-09-14 DIAGNOSIS — M79672 Pain in left foot: Secondary | ICD-10-CM

## 2019-09-14 DIAGNOSIS — I999 Unspecified disorder of circulatory system: Secondary | ICD-10-CM

## 2019-09-14 DIAGNOSIS — M778 Other enthesopathies, not elsewhere classified: Secondary | ICD-10-CM

## 2019-09-14 NOTE — Progress Notes (Signed)
Subjective:   Patient ID: Todd Weeks, male   DOB: 64 y.o.   MRN: [id number]   HPI Patient presents stating he stands and works all day that he has vascular disease and has not had a check for a while and that he has quite a bit of pain in the outside of both feet and in general in the bottom of both feet.  Patient has stopped smoking and has had a stent in his right leg from 7 years ago and would like to be more active   Review of Systems  All other systems reviewed and are negative.       Objective:  Physical Exam Vitals and nursing note reviewed.  Constitutional:      Appearance: He is well-developed.  Pulmonary:     Effort: Pulmonary effort is normal.  Musculoskeletal:        General: Normal range of motion.  Skin:    General: Skin is warm.  Neurological:     Mental Status: He is alert.     Vascular status found to be diminished with diminished PT and DP pulses bilateral.  Patient has diminished cap fill time does have normal sharp dull vibratory bilateral is found to have exquisite discomfort around the fifth MPJs bilateral lesion formation bilateral that can be moderately painful and generalized foot pain     Assessment:  Inflammatory capsulitis fifth MPJ bilateral vascular disease which may be part of this with lesion formation consistent with porokeratosis     Plan:  H&P reviewed all conditions and we are sending him back to see his vascular doctor for evaluation with the possibility at 1 point that he may require revascularization.  At this point I did go ahead and I injected the fifth MPJ bilateral 3 mg Kenalog 5 mg Xylocaine I debrided all lesions bilateral and then were going to consider orthotics for him in the future.  Patient indicates that there is some irritation but I did not note any arterial calcification or any indications of stress fracture advanced arthritis signed visit

## 2019-09-15 NOTE — Addendum Note (Signed)
Addended by: Cranford Mon R on: 09/15/2019 11:36 AM   Modules accepted: Orders

## 2019-09-15 NOTE — Addendum Note (Signed)
Addended by: Cranford Mon R on: 09/15/2019 11:51 AM   Modules accepted: Orders

## 2019-09-16 ENCOUNTER — Ambulatory Visit (HOSPITAL_COMMUNITY)
Admission: RE | Admit: 2019-09-16 | Discharge: 2019-09-16 | Disposition: A | Discharge status: Home / Self Care | Payer: PPO | Source: Ambulatory Visit | Attending: Podiatry | Admitting: Podiatry

## 2019-09-16 ENCOUNTER — Other Ambulatory Visit: Payer: Self-pay

## 2019-09-16 DIAGNOSIS — I999 Unspecified disorder of circulatory system: Secondary | ICD-10-CM

## 2019-09-22 ENCOUNTER — Telehealth: Payer: Self-pay | Admitting: *Deleted

## 2019-09-22 NOTE — Telephone Encounter (Signed)
Left message for the pt to please call the office to schedule an appt for pre op clearance. Pt has procedure set for 10/26/19 and will need appt with Dr. Angelena Form or APP.

## 2019-09-22 NOTE — Telephone Encounter (Signed)
   Primary Cardiologist:Christopher Angelena Form, MD  Chart reviewed as part of pre-operative protocol coverage. Patient was last seen by our office in 05/2018. Therefore, he will require a follow-up visit in order to better assess preoperative cardiovascular risk.  Pre-op covering staff: - Please schedule appointment and call patient to inform them. - Please contact requesting surgeon's office via preferred method (i.e, phone, fax) to inform them of need for appointment prior to surgery.   Darreld Mclean, PA-C  09/22/2019, 3:24 PM

## 2019-09-22 NOTE — Telephone Encounter (Signed)
   [facility] Pre-operative Risk Assessment    Request for surgical clearance:  1. What type of surgery is being performed? PROSTATE BX   2. When is this surgery scheduled? 10/26/19   3. What type of clearance is required (medical clearance vs. Pharmacy clearance to hold med vs. Both)? MEDICAL  4. Are there any medications that need to be held prior to surgery and how Friscia? PLAVIX 5 DAYS PRIOR   5. Practice name and name of physician performing surgery? [facility]; DR. Annie Main DAHLSTEDT   6. What is your office phone number [phone]    7.   What is your office fax number [phone]  8.   Anesthesia type (None, local, MAC, general) ? NOT LISTED   Julaine Hua 09/22/2019, 3:15 PM  _________________________________________________________________   (provider comments below)

## 2019-09-23 NOTE — Telephone Encounter (Signed)
Appt has been made for pt to see Melina Copa, [facility] 10/13/19 @ 9:15 for pre op clearance. I will forward clearance note to [facility] for upcoming appt. Will send FYI to the surgeon pt has upcoming cards appt for clearance. Once pt has been cleared our office will fax over clearance. I will remove from the pre op call back pool.

## 2019-09-23 NOTE — Telephone Encounter (Signed)
Left message x 2 to please call the office to schedule pre op appt. Pt procedure is set for 10/26/19. Appt with Dr. Angelena Form or APP.

## 2019-10-05 ENCOUNTER — Other Ambulatory Visit: Payer: Self-pay | Admitting: Family Medicine

## 2019-10-05 DIAGNOSIS — I2510 Atherosclerotic heart disease of native coronary artery without angina pectoris: Secondary | ICD-10-CM

## 2019-10-12 ENCOUNTER — Encounter: Payer: Self-pay | Admitting: Physician Assistant

## 2019-10-12 ENCOUNTER — Ambulatory Visit: Payer: PPO | Admitting: Podiatry

## 2019-10-12 NOTE — Progress Notes (Signed)
Cardiology Office Note  Date: 10/13/2019   ID: Marqueis, [place] [birth year], MRN [id number]  PCP:  Vivi Barrack, MD  Cardiologist:  [name], MD Electrophysiologist:  None   Chief Complaint  Patient presents with  . Follow-up    For hypertension, hyperlipidemia, PAD     History of Present Illness: Todd Weeks is a 64 y.o. male with a history of HTN, HLD, PAD, tobacco abuse, depression, polysubstance use (remote alcohol use, marijuana, crack cocaine).  Cardiac cath 2012, mild disease RCA.  None in LAD or circumflex.  LE angiogram 2012 proximal superficial femoral artery stenosis, left superficial femoral artery was occluded at the ostium, stent placed in right SFA September 2012.  Admission in September 2019 with dyspnea and chest pain.  Troponins negative.  Echo 2019 normal LV function with EF 55 to 60%.  No WMA's, no valve disease, nuclear Myoview stress September 2019 no ischemia.  Last saw Dr. Angelena Form on 05/23/2018.  Had complained of pain in both legs when walking [place] distances.  No rest pain.  Leg cramps at night.  Recent ABI results 09/17/2019  Right: Resting right ankle-brachial index indicates moderate right lower extremity arterial disease. The right toe-brachial index is abnormal. Left: Resting left ankle-brachial index indicates moderate left lower extremity arterial disease. The left toebrachialindex is abnormal.  History of 69-pack-year smoking and quit in 2012.    He is scheduled to have prostate biopsy  10/26/2019 needs cardiac clearance.  There was a request for Plavix to be held for 5 days prior to procedure.  He is doing well without anginal symptoms or shortness of breath. He does continue to have claudication bilaterally with exertion, can walk about 50 feet then has to stop and rest. He plans to re-establish with vascular soon. He has gained some weight which he attributes to poor diet and not exercising.    Past Medical History:  Diagnosis Date    . Bell's palsy   . Closed nondisplaced fracture of neck of third metacarpal bone of right hand 02/04/2017  . Clotting disorder ([place])   . Depression   . Diverticulitis   . GERD (gastroesophageal reflux disease)   . Headache(784.0)   . HTN (hypertension)   . Hyperlipidemia   . Mild CAD 2012  . PVD (peripheral vascular disease) ([place])   . Sexual dysfunction     Past Surgical History:  Procedure Laterality Date  . PROSTATE BIOPSY  2015    Current Outpatient Medications  Medication Sig Dispense Refill  . AFLURIA QUADRIVALENT 0.5 ML injection     . atorvastatin (LIPITOR) 40 MG tablet Take 1 tablet (40 mg total) by mouth daily. 90 tablet 3  . clopidogrel (PLAVIX) 75 MG tablet TAKE (1) TABLET BY MOUTH ONCE DAILY. 90 tablet 0  . famotidine (PEPCID) 20 MG tablet One daily 90 tablet 3  . FLUoxetine (PROZAC) 20 MG capsule TAKE 1 CAPSULE BY MOUTH EVERY MORNING 90 capsule 0  . irbesartan (AVAPRO) 300 MG tablet Take 1 tablet (300 mg total) by mouth daily. 90 tablet 3  . tamsulosin (FLOMAX) 0.4 MG CAPS capsule Take 1 capsule (0.4 mg total) by mouth daily. 30 capsule 3   No current facility-administered medications for this visit.   Allergies:  Patient has no known allergies.   Social History: The patient  reports that he quit smoking about 7 years ago. His smoking use included cigarettes. He has a 69.00 pack-year smoking history. He has never used smokeless tobacco. He  reports current alcohol use of about 1.0 standard drinks of alcohol per week. He reports current drug use. Drugs: Cocaine, Marijuana, and "Crack" cocaine.   Family History: The patient's family history includes Asthma in his son; Deep vein thrombosis in his father; Diabetes in his brother; Emphysema in his maternal grandfather; Hypertension in his mother; Lung disease in his brother; Tuberculosis in his maternal grandfather.   ROS:  Please see the history of present illness. Otherwise, complete review of systems is positive for  none.  All other systems are reviewed and negative.   Physical Exam: VS:  BP 140/90   Pulse 67   Ht 6' (1.829 m)   Wt 270 lb (122.5 kg)   SpO2 98%   BMI 36.62 kg/m , BMI Body mass index is 36.62 kg/m.  Wt Readings from Last 3 Encounters:  10/13/19 270 lb (122.5 kg)  08/21/19 266 lb 4 oz (120.8 kg)  03/13/19 238 lb 9.6 oz (108.2 kg)    General: Patient appears comfortable at rest. Neck: Supple, no elevated JVP or carotid bruits, no thyromegaly. Lungs: Clear to auscultation, nonlabored breathing at rest. Cardiac: Regular rate and rhythm, no S3 or significant systolic murmur, no pericardial rub. Abdomen: Soft, nontender, no hepatomegaly, bowel sounds present, no guarding or rebound. Extremities: No pitting edema, distal pulses 2+, no non-healing wounds Skin: Warm and dry. Musculoskeletal: No kyphosis. Neuropsychiatric: Alert and oriented x3, affect grossly appropriate.  ECG:  An ECG dated 10/13/2019 was personally reviewed today and demonstrated:  Normal sinus rhythm rate of 67, normal axis, no hypertrophy, no acute ST or T wave changes.,  Borderline first-degree AV block  Recent Labwork: 03/23/2019: ALT 29; AST 18; BUN 18; Creatinine, Ser 0.62; Hemoglobin 14.7; Platelets 232.0; Potassium 4.0; Sodium 139     Component Value Date/Time   CHOL 166 03/23/2019 0946   CHOL 137 06/11/2014 0000   TRIG 140.0 03/23/2019 0946   TRIG 173 06/11/2014 0000   HDL 51.50 03/23/2019 0946   CHOLHDL 3 03/23/2019 0946   VLDL 28.0 03/23/2019 0946   VLDL 35 06/11/2014 0000   LDLCALC 86 03/23/2019 0946    Other Studies Reviewed Today:  Echo 04/26/18: Left ventricle: The cavity size was normal. Systolic function was normal. The estimated ejection fraction was in the range of 55% to 60%. Wall motion was normal; there were no regional wall motion abnormalities. - Aortic valve: Transvalvular velocity was within the normal range. There was no stenosis. There was no significant  regurgitation. - Mitral valve: There was trivial regurgitation. - Left atrium: The atrium was mildly dilated. - Right ventricle: Systolic function was normal. - Right atrium: The atrium was mildly dilated. - Atrial septum: No defect or patent foramen ovale was identified. - Tricuspid valve: There was trivial regurgitation. - Pulmonic valve: There was no significant regurgitation. - Inferior vena cava: The vessel was normal in size. The respirophasic diameter changes were in the normal range (>= 50%), consistent with normal central venous pressure.  Lexiscan Myoview stress 05/12/2018  Nuclear stress EF: 53%.  No T wave inversion was noted during stress.  There was no ST segment deviation noted during stress.  This is a low risk study.  No reversible ischemia. LVEF 53% with normal wall motion. This is a low risk study. Compared to a prior study in 2012, the LVEF is higher.  Assessment and Plan:  1. Essential hypertension   2. PAD (peripheral artery disease) ([place])   3. Mixed hyperlipidemia   4. Coronary artery disease involving  native coronary artery of native heart without angina pectoris   5. Pre-op evaluation    1. Pre-op evaluation Patient needs clearance for prostate biopsy and permission to hold Plavix for 5 days prior to the procedure.  Per discussion with Dr. Angelena Form, [place] to hold as requested. RCRI 0.4% indicating low risk for CV complications related to surgery. He is not having any concerning cardiac symptoms. Therefore, from our perspective, he is cleared to proceed with surgery without any further cardiovascular testing. We will fax all of the pertinent information to Dr. Diona Fanti who is performing the prostate biopsy.  2. Essential hypertension Blood pressure is elevated on arrival today at 140-146/90.  Recheck was 160/98 patient states he did not take his blood pressure medication today.  Expressed to patient how important it is to take his blood pressure medications  early in the morning in order for the medication to work during the daytime.  Advised that he needed to purchase a blood pressure cuff and monitor his blood pressures.  Ideally we would like to blood pressure less than 130 over 80s.  He states he has a follow-up with his primary care within the next month.  Advised him to purchase a blood pressure cuff and monitor his blood pressure at least 3 times a week.  Advised him to make sure he takes his blood pressure medication earlier in the morning.  Give it a few hours to start working.  He then needs to sit down at the kitchen table and rest for at least 10 minutes then check his blood pressure.  Take the log of blood pressures to his primary care physician at next follow-up.  Continue irbesartan 300 mg daily.  3. PAD (peripheral artery disease) ([place]) Patient admits to increased episodes of claudication.  Thinks maybe he can walk 50 feet or more and starts having muscular pain and has to rest.  He has not followed up with vascular as he should.  He promises he will follow-up with vascular soon for reassessment.  He recently had lower vascular studies on September 17, 2019 which showed moderate right and left lower extremity vascular disease with abnormal TBI's.  Continue Plavix 75 mg daily.   4. Mixed hyperlipidemia Patient states he recently had his atorvastatin increased from 20 mg to 40 mg by PCP.  He has had no recent lipid profile.  He will be following up soon with PCP and will most likely have a lipid profile performed.  Last LDL was 86.  Continue atorvastatin 40 mg daily  5. Coronary artery disease involving native coronary artery of native heart without angina pectoris Patient had a low risk Lexiscan Myoview stress test in September 2019 patient denies any progressive anginal or exertional symptoms.  Medication Adjustments/Labs and Tests Ordered: Current medicines are reviewed at length with the patient today.  Concerns regarding medicines are  outlined above.   Disposition: F/u with Dr. Angelena Form in 1 year.  Signed, Levell July, NP 10/13/2019 1:48 PM    [facility]

## 2019-10-13 ENCOUNTER — Encounter: Payer: Self-pay | Admitting: Physician Assistant

## 2019-10-13 ENCOUNTER — Ambulatory Visit: Payer: PPO | Admitting: Family Medicine

## 2019-10-13 ENCOUNTER — Other Ambulatory Visit: Payer: Self-pay

## 2019-10-13 VITALS — BP 140/90 | HR 67 | Ht 72.0 in | Wt 270.0 lb

## 2019-10-13 DIAGNOSIS — Z01818 Encounter for other preprocedural examination: Secondary | ICD-10-CM | POA: Diagnosis not present

## 2019-10-13 DIAGNOSIS — E782 Mixed hyperlipidemia: Secondary | ICD-10-CM | POA: Diagnosis not present

## 2019-10-13 DIAGNOSIS — I10 Essential (primary) hypertension: Secondary | ICD-10-CM

## 2019-10-13 DIAGNOSIS — I2510 Atherosclerotic heart disease of native coronary artery without angina pectoris: Secondary | ICD-10-CM

## 2019-10-13 DIAGNOSIS — I739 Peripheral vascular disease, unspecified: Secondary | ICD-10-CM | POA: Diagnosis not present

## 2019-10-13 NOTE — Patient Instructions (Signed)
Medication Instructions:  Your physician recommends that you continue on your current medications as directed. Please refer to the Current Medication list given to you today.  *If you need a refill on your cardiac medications before your next appointment, please call your pharmacy*   Lab Work: None ordered  If you have labs (blood work) drawn today and your tests are completely normal, you will receive your results only by: . MyChart Message (if you have MyChart) OR . A paper copy in the mail If you have any lab test that is abnormal or we need to change your treatment, we will call you to review the results.   Testing/Procedures: None ordered   Follow-Up: At [facility], you and your health needs are our priority.  As part of our continuing mission to provide you with exceptional heart care, we have created designated Provider Care Teams.  These Care Teams include your primary Cardiologist (physician) and Advanced Practice Providers (APPs -  Physician Assistants and Nurse Practitioners) who all work together to provide you with the care you need, when you need it.  We recommend signing up for the patient portal called "MyChart".  Sign up information is provided on this After Visit Summary.  MyChart is used to connect with patients for Virtual Visits (Telemedicine).  Patients are able to view lab/test results, encounter notes, upcoming appointments, etc.  Non-urgent messages can be sent to your provider as well.   To learn more about what you can do with MyChart, go to [email or web address].    Your next appointment:   12 month(s)  The format for your next appointment:   In Person  Provider:   You may see Christopher McAlhany, MD or one of the following Advanced Practice Providers on your designated Care Team:    Dayna Dunn, PA-C  Michele Lenze, PA-C    Other Instructions   

## 2019-10-19 ENCOUNTER — Other Ambulatory Visit: Payer: Self-pay

## 2019-10-19 ENCOUNTER — Encounter: Payer: Self-pay | Admitting: Family Medicine

## 2019-10-19 ENCOUNTER — Ambulatory Visit (INDEPENDENT_AMBULATORY_CARE_PROVIDER_SITE_OTHER): Payer: PPO | Admitting: Family Medicine

## 2019-10-19 ENCOUNTER — Ambulatory Visit (INDEPENDENT_AMBULATORY_CARE_PROVIDER_SITE_OTHER): Payer: PPO

## 2019-10-19 VITALS — BP 156/84 | HR 75 | Temp 97.5°F | Ht 72.0 in | Wt 269.2 lb

## 2019-10-19 DIAGNOSIS — I10 Essential (primary) hypertension: Secondary | ICD-10-CM | POA: Diagnosis not present

## 2019-10-19 DIAGNOSIS — M25512 Pain in left shoulder: Secondary | ICD-10-CM

## 2019-10-19 DIAGNOSIS — M19012 Primary osteoarthritis, left shoulder: Secondary | ICD-10-CM | POA: Diagnosis not present

## 2019-10-19 DIAGNOSIS — F419 Anxiety disorder, unspecified: Secondary | ICD-10-CM | POA: Diagnosis not present

## 2019-10-19 DIAGNOSIS — F324 Major depressive disorder, single episode, in partial remission: Secondary | ICD-10-CM

## 2019-10-19 DIAGNOSIS — M19011 Primary osteoarthritis, right shoulder: Secondary | ICD-10-CM | POA: Diagnosis not present

## 2019-10-19 NOTE — Patient Instructions (Signed)
It was very nice to see you today!  I think you have arthritis in your [facility] joint.  We will check an x-ray today.  Please use Voltaren gel.  Let me know if not proving the next 1 to 2 weeks and we can refer you to see a sports medicine doctor.  Take care, Dr Jerline Pain  Please try these tips to maintain a healthy lifestyle:   Eat at least 3 REAL meals and 1-2 snacks per day.  Aim for no more than 5 hours between eating.  If you eat breakfast, please do so within one hour of getting up.    Each meal should contain half fruits/vegetables, one quarter protein, and one quarter carbs (no bigger than a computer mouse)   Cut down on sweet beverages. This includes juice, soda, and sweet tea.     Drink at least 1 glass of water with each meal and aim for at least 8 glasses per day   Exercise at least 150 minutes every week.

## 2019-10-19 NOTE — Assessment & Plan Note (Signed)
Slightly worsened.  He would like to continue Prozac 20 mg daily.  He will follow-up with me if symptoms worsen.

## 2019-10-19 NOTE — Assessment & Plan Note (Signed)
Slightly worsened.  We will continue Prozac 20 mg daily.  He will follow-up with me symptoms continue to worsen.

## 2019-10-19 NOTE — Assessment & Plan Note (Signed)
Above goal today though previously well controlled.  Continue irbesartan 300 mg daily.  Continue home monitoring goal 140/90 or lower.

## 2019-10-19 NOTE — Progress Notes (Signed)
   Todd Weeks is a 64 y.o. male who presents today for an office visit.  Assessment/Plan:  New/Acute Problems: AC Joint Weeks Likely osteoarthritis.  Given several month history will check plain film today to confirm.  Recommended over-the-counter Voltaren gel.  If no improvement would consider referral to sports medicine.  Chronic Problems Addressed Today: Depression, major, single episode, in partial remission (Todd Weeks) Slightly worsened.  He would like to continue Prozac 20 mg daily.  He will follow-up with me if symptoms worsen.  Anxiety Slightly worsened.  We will continue Prozac 20 mg daily.  He will follow-up with me symptoms continue to worsen.  HTN (hypertension) Above goal today though previously well controlled.  Continue irbesartan 300 mg daily.  Continue home monitoring goal 140/90 or lower.     Subjective:  HPI:  Patient noticed a lump in his left shoulder several months ago.  Over the last several weeks has noticed increasing Weeks and twitching in the area.  He has not tried anything for the Weeks.  Has not noticed anything makes the Weeks better or worse.  This 17 year old mother recently fell and has been under increased stress.  He has felt a little "off".  Overall thinks that he is managing well.      Objective:  Physical Exam: BP (!) 156/84   Pulse 75   Temp (!) 97.5 F (36.4 C)   Ht 6' (1.829 m)   Wt 269 lb 3.2 oz (122.1 kg)   SpO2 96%   BMI 36.51 kg/m   Gen: No acute distress, resting comfortably MSK: Left AC joint tender to palpation.  Crepitus noted with active range of motion at [facility] joint.  Nodule noted at left [facility] joint as well.  Weeks with crossover test.  Neurovascular intact distally.      Todd Weeks. Todd Pain, MD 10/19/2019 8:28 AM

## 2019-10-19 NOTE — Progress Notes (Signed)
Please inform patient of the following:  X-ray confirms arthritis.  Would like for him to continue with current treatment plan and let us know if not improving.

## 2019-10-26 DIAGNOSIS — R972 Elevated prostate specific antigen [PSA]: Secondary | ICD-10-CM | POA: Diagnosis not present

## 2019-11-30 ENCOUNTER — Other Ambulatory Visit: Payer: Self-pay | Admitting: Family Medicine

## 2019-11-30 DIAGNOSIS — K219 Gastro-esophageal reflux disease without esophagitis: Secondary | ICD-10-CM

## 2019-12-07 ENCOUNTER — Telehealth: Payer: Self-pay

## 2019-12-07 NOTE — Telephone Encounter (Signed)
Pt called and said that he was having a lot of leg pain and was having cramps and discomfort in his legs.   Pt has not been seen since 2015 by our providers and advised him to call his PCP to do a new referral and we would be happy to get him on the schedule.   York Cerise, CMA

## 2019-12-08 ENCOUNTER — Telehealth: Payer: Self-pay | Admitting: Family Medicine

## 2019-12-08 NOTE — Telephone Encounter (Signed)
Patient called stating he was having right leg pain.  He tried to get scheduled at vascular and vein with Dr. Oneida Alar.  States he needs a referral.  Would like to know if Dr. Jerline Pain could use his 10/19/19 visit to enter a referral.

## 2019-12-08 NOTE — Telephone Encounter (Signed)
Please advise 

## 2019-12-08 NOTE — Telephone Encounter (Signed)
Ok with referral - he had ultrasound done in February which showed peripheral vascular disease.  [name]. Jerline Pain, MD 12/08/2019 3:40 PM

## 2019-12-09 ENCOUNTER — Other Ambulatory Visit: Payer: Self-pay | Admitting: *Deleted

## 2019-12-09 DIAGNOSIS — I739 Peripheral vascular disease, unspecified: Secondary | ICD-10-CM

## 2019-12-09 NOTE — Telephone Encounter (Signed)
Referral for Vascular and veins placed

## 2020-01-04 ENCOUNTER — Other Ambulatory Visit: Payer: Self-pay | Admitting: Family Medicine

## 2020-01-04 DIAGNOSIS — I2510 Atherosclerotic heart disease of native coronary artery without angina pectoris: Secondary | ICD-10-CM

## 2020-01-18 ENCOUNTER — Other Ambulatory Visit: Payer: Self-pay | Admitting: *Deleted

## 2020-01-18 DIAGNOSIS — I70219 Atherosclerosis of native arteries of extremities with intermittent claudication, unspecified extremity: Secondary | ICD-10-CM

## 2020-01-19 ENCOUNTER — Other Ambulatory Visit: Payer: Self-pay | Admitting: Family Medicine

## 2020-01-19 ENCOUNTER — Telehealth: Payer: Self-pay | Admitting: Family Medicine

## 2020-01-19 ENCOUNTER — Other Ambulatory Visit: Payer: Self-pay

## 2020-01-19 DIAGNOSIS — F418 Other specified anxiety disorders: Secondary | ICD-10-CM

## 2020-01-19 DIAGNOSIS — K219 Gastro-esophageal reflux disease without esophagitis: Secondary | ICD-10-CM

## 2020-01-19 MED ORDER — FLUOXETINE HCL 20 MG PO CAPS: 20.0000 mg | ORAL_CAPSULE | Freq: Every morning | ORAL | 0 refills | Status: DC

## 2020-01-19 MED ORDER — FAMOTIDINE 20 MG PO TABS: ORAL_TABLET | ORAL | 3 refills | Status: DC

## 2020-01-19 NOTE — Telephone Encounter (Signed)
  LAST APPOINTMENT DATE: 01/04/2020   NEXT APPOINTMENT DATE:@Visit  date not found  MEDICATION:FLUoxetine (PROZAC) 20 MG capsule // famotidine (PEPCID) 20 MG tablet  PHARMACY:[facility] - [place], [place] - [address]  COMMENTS: Patient states pharmacy has been waiting on approval for 2-4 weeks for these medications.   **Let patient know to contact pharmacy at the end of the day to make sure medication is ready. **  ** Please notify patient to allow 48-72 hours to process**  **Encourage patient to contact the pharmacy for refills or they can request refills through [facility]**  CLINICAL FILLS OUT ALL BELOW:   LAST REFILL:  QTY:  REFILL DATE:    OTHER COMMENTS:    Okay for refill?  Please advise

## 2020-01-19 NOTE — Telephone Encounter (Signed)
Rx sent 

## 2020-01-21 ENCOUNTER — Encounter: Payer: Self-pay | Admitting: Vascular Surgery

## 2020-01-21 ENCOUNTER — Ambulatory Visit (HOSPITAL_COMMUNITY)
Admission: RE | Admit: 2020-01-21 | Discharge: 2020-01-21 | Disposition: A | Discharge status: Home / Self Care | Payer: PPO | Source: Ambulatory Visit | Attending: Vascular Surgery | Admitting: Vascular Surgery

## 2020-01-21 ENCOUNTER — Other Ambulatory Visit: Payer: Self-pay

## 2020-01-21 ENCOUNTER — Ambulatory Visit (INDEPENDENT_AMBULATORY_CARE_PROVIDER_SITE_OTHER): Payer: PPO | Admitting: Vascular Surgery

## 2020-01-21 VITALS — BP 135/97 | HR 64 | Temp 97.3°F | Resp 18 | Ht 72.0 in | Wt 255.0 lb

## 2020-01-21 DIAGNOSIS — I70219 Atherosclerosis of native arteries of extremities with intermittent claudication, unspecified extremity: Secondary | ICD-10-CM | POA: Insufficient documentation

## 2020-01-21 DIAGNOSIS — I739 Peripheral vascular disease, unspecified: Secondary | ICD-10-CM

## 2020-01-21 NOTE — Progress Notes (Signed)
Referring Physician: Dimas Chyle  Patient name: Todd Weeks MRN: [id number] DOB: 03/15/1956 Sex: male  REASON FOR CONSULT: Claudication  HPI: Todd Weeks is a 64 y.o. male, with a known history of lower extremity claudication right leg worse than the left. He was seen several years ago in 2013 with mainly right leg claudication symptoms. At that time he did not have insurance so he did not want to undergo an intervention. He has since that time tried to walk and improve his walking distance. However he still has difficulty moving around with his right leg causing claudication after about 200 feet. He states the left leg is not far behind his. He also has some numbness and tingling in his right second toe. Per his chart he had a right external iliac artery stent in 2002. He also previously had a right superficial femoral artery stent by Dr. Angelena Form several years ago. He currently has a blister which has not healed on his right second toe. Other medical problems include hypertension hyperlipidemia coronary artery disease all of which have been stable. He has not smoked for 8 years. He is on a statin and Plavix. He feels very disabled by his inability to be able to walk [place] distances.  Current Outpatient Medications on File Prior to Visit  Medication Sig Dispense Refill  . AFLURIA QUADRIVALENT 0.5 ML injection     . atorvastatin (LIPITOR) 40 MG tablet Take 1 tablet (40 mg total) by mouth daily. 90 tablet 3  . clopidogrel (PLAVIX) 75 MG tablet TAKE (1) TABLET BY MOUTH ONCE DAILY. 90 tablet 0  . famotidine (PEPCID) 20 MG tablet One daily 90 tablet 3  . FLUoxetine (PROZAC) 20 MG capsule Take 1 capsule (20 mg total) by mouth every morning. 90 capsule 0  . irbesartan (AVAPRO) 300 MG tablet Take 1 tablet (300 mg total) by mouth daily. 90 tablet 3  . tamsulosin (FLOMAX) 0.4 MG CAPS capsule Take 1 capsule (0.4 mg total) by mouth daily. 30 capsule 3   No current facility-administered medications on  file prior to visit.     Past Medical History:  Diagnosis Date  . Bell's palsy   . Closed nondisplaced fracture of neck of third metacarpal bone of right hand 02/04/2017  . Clotting disorder ([place])   . Depression   . Diverticulitis   . GERD (gastroesophageal reflux disease)   . Headache(784.0)   . HTN (hypertension)   . Hyperlipidemia   . Mild CAD 2012  . PVD (peripheral vascular disease) ([place])   . Sexual dysfunction    Past Surgical History:  Procedure Laterality Date  . PROSTATE BIOPSY  2015    Family History  Problem Relation Age of Onset  . Hypertension Mother   . Deep vein thrombosis Father   . Diabetes Brother   . Tuberculosis Maternal Grandfather   . Emphysema Maternal Grandfather   . Lung disease Brother   . Asthma Son   . Rectal cancer Neg Hx   . Colon cancer Neg Hx     SOCIAL HISTORY: Social History   Socioeconomic History  . Marital status: Single    Spouse name: Not on file  . Number of children: 2  . Years of education: 28  . Highest education level: Not on file  Occupational History  . Occupation: Electrical engineer  . Occupation: Unemployed    Comment: disability  Tobacco Use  . Smoking status: Former Smoker    Packs/day: 1.50    Years:  46.00    Pack years: 69.00    Types: Cigarettes    Quit date: 12/12/2011    Years since quitting: 8.1  . Smokeless tobacco: Never Used  Vaping Use  . Vaping Use: Never used  Substance and Sexual Activity  . Alcohol use: Yes    Alcohol/week: 1.0 standard drink    Types: 1 Cans of beer per week    Comment: a couple of beer on a weekend  . Drug use: Yes    Types: Cocaine, Marijuana, "Crack" cocaine    Comment: november  . Sexual activity: Not on file  Other Topics Concern  . Not on file  Social History Narrative   Raised in [place], [place]. Does not have any religious beliefs that would effect healthcare. Lives in house with sister. Likes to ride motorcycle for fun.    Moving out on his own; in the next  couple of weeks will be moving back home that he had rented      [employer]; with a helmet   Social Determinants of Health   Financial Resource Strain:   . Difficulty of Paying Living Expenses:   Food Insecurity:   . Worried About Charity fundraiser in the Last Year:   . Arboriculturist in the Last Year:   Transportation Needs:   . Film/video editor (Medical):   Marland Kitchen Lack of Transportation (Non-Medical):   Physical Activity:   . Days of Exercise per Week:   . Minutes of Exercise per Session:   Stress:   . Feeling of Stress :   Social Connections:   . Frequency of Communication with Friends and Family:   . Frequency of Social Gatherings with Friends and Family:   . Attends Religious Services:   . Active Member of Clubs or Organizations:   . Attends Archivist Meetings:   Marland Kitchen Marital Status:   Intimate Partner Violence:   . Fear of Current or Ex-Partner:   . Emotionally Abused:   Marland Kitchen Physically Abused:   . Sexually Abused:     No Known Allergies  Current Outpatient Medications  Medication Sig Dispense Refill  . AFLURIA QUADRIVALENT 0.5 ML injection     . atorvastatin (LIPITOR) 40 MG tablet Take 1 tablet (40 mg total) by mouth daily. 90 tablet 3  . clopidogrel (PLAVIX) 75 MG tablet TAKE (1) TABLET BY MOUTH ONCE DAILY. 90 tablet 0  . famotidine (PEPCID) 20 MG tablet One daily 90 tablet 3  . FLUoxetine (PROZAC) 20 MG capsule Take 1 capsule (20 mg total) by mouth every morning. 90 capsule 0  . irbesartan (AVAPRO) 300 MG tablet Take 1 tablet (300 mg total) by mouth daily. 90 tablet 3  . tamsulosin (FLOMAX) 0.4 MG CAPS capsule Take 1 capsule (0.4 mg total) by mouth daily. 30 capsule 3   No current facility-administered medications for this visit.    ROS:   General:  No weight loss, Fever, chills  HEENT: No recent headaches, no nasal bleeding, no visual changes, no sore throat  Neurologic: No dizziness, blackouts, seizures. No recent symptoms of stroke or mini-  stroke. No recent episodes of slurred speech, or temporary blindness.  Cardiac: No recent episodes of chest pain/pressure, no shortness of breath at rest.  No shortness of breath with exertion.  Denies history of atrial fibrillation or irregular heartbeat  Vascular: No history of rest pain in feet.  No history of claudication.  No history of non-healing ulcer, No history of DVT  Pulmonary: No home oxygen, no productive cough, no hemoptysis,  No asthma or wheezing  Musculoskeletal:  [ ]  Arthritis, [ ]  Low back pain,  [ ]  Joint pain  Hematologic:No history of hypercoagulable state.  No history of easy bleeding.  No history of anemia  Gastrointestinal: No hematochezia or melena,  No gastroesophageal reflux, no trouble swallowing  Urinary: [ ]  chronic Kidney disease, [ ]  on HD - [ ]  MWF or [ ]  TTHS, [ ]  Burning with urination, [ ]  Frequent urination, [ ]  Difficulty urinating;   Skin: No rashes  Psychological: No history of anxiety,  No history of depression   Physical Examination  Vitals:   01/21/20 0946  BP: (!) 135/97  Pulse: 64  Resp: 18  Temp: (!) 97.3 F (36.3 C)  TempSrc: Temporal  SpO2: 96%  Weight: 255 lb (115.7 kg)  Height: 6' (1.829 m)    Body mass index is 34.58 kg/m.  General:  Alert and oriented, no acute distress HEENT: Normal Neck: No bruit or JVD Pulmonary: Clear to auscultation bilaterally Cardiac: Regular Rate and Rhythm Abdomen: Soft, non-tender, non-distended, no mass, no scars Skin: No rash Extremity Pulses:  2+ radial, brachial, femoral, absent dorsalis pedis, posterior tibial pulses bilaterally Musculoskeletal: No deformity or edema  Neurologic: Upper and lower extremity motor 5/5 and symmetric     DATA:  Patient had bilateral ABIs performed today which I reviewed and interpreted. Right side was 0.71 left side 0.89 essentially no change since February 2021.  ASSESSMENT: Bilateral lower extremity claudication right worse than left. Most  likely he has chronic occlusion of the SFA stent on the right side.   PLAN: I discussed the patient today that most likely if the stent in his right leg has occluded it would not be able to recanalize. In light of this we will do aortogram lower extremity runoff with diagnostic images of the right leg potentially for operative revascularization in the near future. If we have a percutaneous option to go ahead and treat his left leg we will do that during the arteriogram. Risk benefits possible complications of procedure details were discussed with the patient today including not limited to bleeding infection vessel injury contrast reaction patient understands and agrees to proceed.  Ruta Hinds, MD [facility] Office: [phone]

## 2020-01-21 NOTE — H&P (View-Only) (Signed)
Referring Physician: Dimas Chyle  Patient name: Todd Weeks MRN: [id number] DOB: 04/03/1956 Sex: male  REASON FOR CONSULT: Claudication  HPI: Todd Weeks is a 64 y.o. male, with a known history of lower extremity claudication right leg worse than the left. He was seen several years ago in 2013 with mainly right leg claudication symptoms. At that time he did not have insurance so he did not want to undergo an intervention. He has since that time tried to walk and improve his walking distance. However he still has difficulty moving around with his right leg causing claudication after about 200 feet. He states the left leg is not far behind his. He also has some numbness and tingling in his right second toe. Per his chart he had a right external iliac artery stent in 2002. He also previously had a right superficial femoral artery stent by Dr. Angelena Form several years ago. He currently has a blister which has not healed on his right second toe. Other medical problems include hypertension hyperlipidemia coronary artery disease all of which have been stable. He has not smoked for 8 years. He is on a statin and Plavix. He feels very disabled by his inability to be able to walk [place] distances.  Current Outpatient Medications on File Prior to Visit  Medication Sig Dispense Refill   AFLURIA QUADRIVALENT 0.5 ML injection      atorvastatin (LIPITOR) 40 MG tablet Take 1 tablet (40 mg total) by mouth daily. 90 tablet 3   clopidogrel (PLAVIX) 75 MG tablet TAKE (1) TABLET BY MOUTH ONCE DAILY. 90 tablet 0   famotidine (PEPCID) 20 MG tablet One daily 90 tablet 3   FLUoxetine (PROZAC) 20 MG capsule Take 1 capsule (20 mg total) by mouth every morning. 90 capsule 0   irbesartan (AVAPRO) 300 MG tablet Take 1 tablet (300 mg total) by mouth daily. 90 tablet 3   tamsulosin (FLOMAX) 0.4 MG CAPS capsule Take 1 capsule (0.4 mg total) by mouth daily. 30 capsule 3   No current facility-administered medications on  file prior to visit.     Past Medical History:  Diagnosis Date   Bell's palsy    Closed nondisplaced fracture of neck of third metacarpal bone of right hand 02/04/2017   Clotting disorder ([facility])    Depression    Diverticulitis    GERD (gastroesophageal reflux disease)    Headache(784.0)    HTN (hypertension)    Hyperlipidemia    Mild CAD 2012   PVD (peripheral vascular disease) ([facility])    Sexual dysfunction    Past Surgical History:  Procedure Laterality Date   PROSTATE BIOPSY  2015    Family History  Problem Relation Age of Onset   Hypertension Mother    Deep vein thrombosis Father    Diabetes Brother    Tuberculosis Maternal Grandfather    Emphysema Maternal Grandfather    Lung disease Brother    Asthma Son    Rectal cancer Neg Hx    Colon cancer Neg Hx     SOCIAL HISTORY: Social History   Socioeconomic History   Marital status: Single    Spouse name: Not on file   Number of children: 2   Years of education: 12   Highest education level: Not on file  Occupational History   Occupation: Electrical engineer   Occupation: Unemployed    Comment: disability  Tobacco Use   Smoking status: Former Smoker    Packs/day: 1.50    Years:  46.00    Pack years: 69.00    Types: Cigarettes    Quit date: 12/12/2011    Years since quitting: 8.1   Smokeless tobacco: Never Used  Vaping Use   Vaping Use: Never used  Substance and Sexual Activity   Alcohol use: Yes    Alcohol/week: 1.0 standard drink    Types: 1 Cans of beer per week    Comment: a couple of beer on a weekend   Drug use: Yes    Types: Cocaine, Marijuana, "Crack" cocaine    Comment: november   Sexual activity: Not on file  Other Topics Concern   Not on file  Social History Narrative   Raised in [place], [place]. Does not have any religious beliefs that would effect healthcare. Lives in house with sister. Likes to ride motorcycle for fun.    Moving out on his own; in the next  couple of weeks will be moving back home that he had rented      [employer]; with a helmet   Social Determinants of Health   Financial Resource Strain:    Difficulty of Paying Living Expenses:   Food Insecurity:    Worried About Charity fundraiser in the Last Year:    Arboriculturist in the Last Year:   Transportation Needs:    Film/video editor (Medical):    Lack of Transportation (Non-Medical):   Physical Activity:    Days of Exercise per Week:    Minutes of Exercise per Session:   Stress:    Feeling of Stress :   Social Connections:    Frequency of Communication with Friends and Family:    Frequency of Social Gatherings with Friends and Family:    Attends Religious Services:    Active Member of Clubs or Organizations:    Attends Music therapist:    Marital Status:   Intimate Partner Violence:    Fear of Current or Ex-Partner:    Emotionally Abused:    Physically Abused:    Sexually Abused:     No Known Allergies  Current Outpatient Medications  Medication Sig Dispense Refill   AFLURIA QUADRIVALENT 0.5 ML injection      atorvastatin (LIPITOR) 40 MG tablet Take 1 tablet (40 mg total) by mouth daily. 90 tablet 3   clopidogrel (PLAVIX) 75 MG tablet TAKE (1) TABLET BY MOUTH ONCE DAILY. 90 tablet 0   famotidine (PEPCID) 20 MG tablet One daily 90 tablet 3   FLUoxetine (PROZAC) 20 MG capsule Take 1 capsule (20 mg total) by mouth every morning. 90 capsule 0   irbesartan (AVAPRO) 300 MG tablet Take 1 tablet (300 mg total) by mouth daily. 90 tablet 3   tamsulosin (FLOMAX) 0.4 MG CAPS capsule Take 1 capsule (0.4 mg total) by mouth daily. 30 capsule 3   No current facility-administered medications for this visit.    ROS:   General:  No weight loss, Fever, chills  HEENT: No recent headaches, no nasal bleeding, no visual changes, no sore throat  Neurologic: No dizziness, blackouts, seizures. No recent symptoms of stroke or mini-  stroke. No recent episodes of slurred speech, or temporary blindness.  Cardiac: No recent episodes of chest pain/pressure, no shortness of breath at rest.  No shortness of breath with exertion.  Denies history of atrial fibrillation or irregular heartbeat  Vascular: No history of rest pain in feet.  No history of claudication.  No history of non-healing ulcer, No history of DVT  Pulmonary: No home oxygen, no productive cough, no hemoptysis,  No asthma or wheezing  Musculoskeletal:  [ ]  Arthritis, [ ]  Low back pain,  [ ]  Joint pain  Hematologic:No history of hypercoagulable state.  No history of easy bleeding.  No history of anemia  Gastrointestinal: No hematochezia or melena,  No gastroesophageal reflux, no trouble swallowing  Urinary: [ ]  chronic Kidney disease, [ ]  on HD - [ ]  MWF or [ ]  TTHS, [ ]  Burning with urination, [ ]  Frequent urination, [ ]  Difficulty urinating;   Skin: No rashes  Psychological: No history of anxiety,  No history of depression   Physical Examination  Vitals:   01/21/20 0946  BP: (!) 135/97  Pulse: 64  Resp: 18  Temp: (!) 97.3 F (36.3 C)  TempSrc: Temporal  SpO2: 96%  Weight: 255 lb (115.7 kg)  Height: 6' (1.829 m)    Body mass index is 34.58 kg/m.  General:  Alert and oriented, no acute distress HEENT: Normal Neck: No bruit or JVD Pulmonary: Clear to auscultation bilaterally Cardiac: Regular Rate and Rhythm Abdomen: Soft, non-tender, non-distended, no mass, no scars Skin: No rash Extremity Pulses:  2+ radial, brachial, femoral, absent dorsalis pedis, posterior tibial pulses bilaterally Musculoskeletal: No deformity or edema  Neurologic: Upper and lower extremity motor 5/5 and symmetric     DATA:  Patient had bilateral ABIs performed today which I reviewed and interpreted. Right side was 0.71 left side 0.89 essentially no change since February 2021.  ASSESSMENT: Bilateral lower extremity claudication right worse than left. Most  likely he has chronic occlusion of the SFA stent on the right side.   PLAN: I discussed the patient today that most likely if the stent in his right leg has occluded it would not be able to recanalize. In light of this we will do aortogram lower extremity runoff with diagnostic images of the right leg potentially for operative revascularization in the near future. If we have a percutaneous option to go ahead and treat his left leg we will do that during the arteriogram. Risk benefits possible complications of procedure details were discussed with the patient today including not limited to bleeding infection vessel injury contrast reaction patient understands and agrees to proceed.  Ruta Hinds, MD [facility] Office: [phone]

## 2020-01-30 DIAGNOSIS — L03031 Cellulitis of right toe: Secondary | ICD-10-CM | POA: Diagnosis not present

## 2020-02-04 DIAGNOSIS — L97509 Non-pressure chronic ulcer of other part of unspecified foot with unspecified severity: Secondary | ICD-10-CM | POA: Diagnosis not present

## 2020-02-04 DIAGNOSIS — L03031 Cellulitis of right toe: Secondary | ICD-10-CM | POA: Diagnosis not present

## 2020-02-04 DIAGNOSIS — E11621 Type 2 diabetes mellitus with foot ulcer: Secondary | ICD-10-CM | POA: Diagnosis not present

## 2020-02-05 ENCOUNTER — Other Ambulatory Visit: Payer: Self-pay

## 2020-02-05 ENCOUNTER — Encounter (HOSPITAL_COMMUNITY)
Admission: RE | Disposition: A | Discharge status: Home / Self Care | Payer: Self-pay | Source: Home / Self Care | Attending: Vascular Surgery

## 2020-02-05 ENCOUNTER — Ambulatory Visit (HOSPITAL_COMMUNITY)
Admission: RE | Admit: 2020-02-05 | Discharge: 2020-02-05 | Disposition: A | Discharge status: Home / Self Care | Payer: PPO | Attending: Vascular Surgery | Admitting: Vascular Surgery

## 2020-02-05 DIAGNOSIS — G510 Bell's palsy: Secondary | ICD-10-CM | POA: Insufficient documentation

## 2020-02-05 DIAGNOSIS — I10 Essential (primary) hypertension: Secondary | ICD-10-CM | POA: Insufficient documentation

## 2020-02-05 DIAGNOSIS — Z7902 Long term (current) use of antithrombotics/antiplatelets: Secondary | ICD-10-CM | POA: Diagnosis not present

## 2020-02-05 DIAGNOSIS — Z95828 Presence of other vascular implants and grafts: Secondary | ICD-10-CM | POA: Insufficient documentation

## 2020-02-05 DIAGNOSIS — Z79899 Other long term (current) drug therapy: Secondary | ICD-10-CM | POA: Diagnosis not present

## 2020-02-05 DIAGNOSIS — I2510 Atherosclerotic heart disease of native coronary artery without angina pectoris: Secondary | ICD-10-CM | POA: Diagnosis not present

## 2020-02-05 DIAGNOSIS — Z87891 Personal history of nicotine dependence: Secondary | ICD-10-CM | POA: Diagnosis not present

## 2020-02-05 DIAGNOSIS — Z832 Family history of diseases of the blood and blood-forming organs and certain disorders involving the immune mechanism: Secondary | ICD-10-CM | POA: Insufficient documentation

## 2020-02-05 DIAGNOSIS — E785 Hyperlipidemia, unspecified: Secondary | ICD-10-CM | POA: Insufficient documentation

## 2020-02-05 DIAGNOSIS — I70223 Atherosclerosis of native arteries of extremities with rest pain, bilateral legs: Secondary | ICD-10-CM | POA: Diagnosis not present

## 2020-02-05 DIAGNOSIS — I70213 Atherosclerosis of native arteries of extremities with intermittent claudication, bilateral legs: Secondary | ICD-10-CM | POA: Insufficient documentation

## 2020-02-05 DIAGNOSIS — Z8249 Family history of ischemic heart disease and other diseases of the circulatory system: Secondary | ICD-10-CM | POA: Insufficient documentation

## 2020-02-05 DIAGNOSIS — K219 Gastro-esophageal reflux disease without esophagitis: Secondary | ICD-10-CM | POA: Insufficient documentation

## 2020-02-05 HISTORY — PX: ABDOMINAL AORTOGRAM W/LOWER EXTREMITY: CATH118223

## 2020-02-05 LAB — POCT I-STAT, CHEM 8
BUN: 17 mg/dL (ref 8–23)
Calcium, Ion: 1.19 mmol/L (ref 1.15–1.40)
Chloride: 102 mmol/L (ref 98–111)
Creatinine, Ser: 0.6 mg/dL — ABNORMAL LOW (ref 0.61–1.24)
Glucose, Bld: 111 mg/dL — ABNORMAL HIGH (ref 70–99)
HCT: 44 % (ref 39.0–52.0)
Hemoglobin: 15 g/dL (ref 13.0–17.0)
Potassium: 4.1 mmol/L (ref 3.5–5.1)
Sodium: 140 mmol/L (ref 135–145)
TCO2: 27 mmol/L (ref 22–32)

## 2020-02-05 SURGERY — ABDOMINAL AORTOGRAM W/LOWER EXTREMITY
Anesthesia: LOCAL | Laterality: Bilateral

## 2020-02-05 MED ORDER — HEPARIN (PORCINE) IN NACL 1000-0.9 UT/500ML-% IV SOLN
INTRAVENOUS | Status: AC
  Filled 2020-02-05: qty 1000

## 2020-02-05 MED ORDER — MORPHINE SULFATE (PF) 2 MG/ML IV SOLN: 2.0000 mg | INTRAVENOUS | Status: DC | PRN

## 2020-02-05 MED ORDER — LIDOCAINE HCL (PF) 1 % IJ SOLN
INTRAMUSCULAR | Status: AC
  Filled 2020-02-05: qty 30

## 2020-02-05 MED ORDER — SODIUM CHLORIDE 0.9 % IV SOLN: INTRAVENOUS | Status: DC

## 2020-02-05 MED ORDER — LIDOCAINE HCL (PF) 1 % IJ SOLN
INTRAMUSCULAR | Status: DC | PRN
  Administered 2020-02-05: 15 mL

## 2020-02-05 MED ORDER — SODIUM CHLORIDE 0.9% FLUSH: 3.0000 mL | Freq: Two times a day (BID) | INTRAVENOUS | Status: DC

## 2020-02-05 MED ORDER — IODIXANOL 320 MG/ML IV SOLN
INTRAVENOUS | Status: DC | PRN
  Administered 2020-02-05: 127 mL via INTRA_ARTERIAL

## 2020-02-05 MED ORDER — HYDRALAZINE HCL 20 MG/ML IJ SOLN
INTRAMUSCULAR | Status: AC
  Filled 2020-02-05: qty 1

## 2020-02-05 MED ORDER — HYDRALAZINE HCL 20 MG/ML IJ SOLN
INTRAMUSCULAR | Status: DC | PRN
  Administered 2020-02-05: 10 mg via INTRAVENOUS

## 2020-02-05 MED ORDER — LABETALOL HCL 5 MG/ML IV SOLN: 10.0000 mg | INTRAVENOUS | Status: DC | PRN

## 2020-02-05 MED ORDER — ONDANSETRON HCL 4 MG/2ML IJ SOLN: 4.0000 mg | Freq: Four times a day (QID) | INTRAMUSCULAR | Status: DC | PRN

## 2020-02-05 MED ORDER — OXYCODONE HCL 5 MG PO TABS: 5.0000 mg | ORAL_TABLET | ORAL | Status: DC | PRN

## 2020-02-05 MED ORDER — HEPARIN (PORCINE) IN NACL 1000-0.9 UT/500ML-% IV SOLN
INTRAVENOUS | Status: DC | PRN
  Administered 2020-02-05 (×2): 500 mL

## 2020-02-05 MED ORDER — ACETAMINOPHEN 325 MG PO TABS: 650.0000 mg | ORAL_TABLET | ORAL | Status: DC | PRN

## 2020-02-05 MED ORDER — HYDRALAZINE HCL 20 MG/ML IJ SOLN
5.0000 mg | INTRAMUSCULAR | Status: DC | PRN
  Administered 2020-02-05: 5 mg via INTRAVENOUS

## 2020-02-05 MED ORDER — SODIUM CHLORIDE 0.9% FLUSH: 3.0000 mL | INTRAVENOUS | Status: DC | PRN

## 2020-02-05 MED ORDER — SODIUM CHLORIDE 0.9 % IV SOLN: 250.0000 mL | INTRAVENOUS | Status: DC | PRN

## 2020-02-05 SURGICAL SUPPLY — 8 items
CATH ANGIO 5F PIGTAIL 65CM (CATHETERS) ×2 IMPLANT
KIT MICROPUNCTURE NIT STIFF (SHEATH) IMPLANT
KIT PV (KITS) ×2 IMPLANT
SHEATH PINNACLE 5F 10CM (SHEATH) ×2 IMPLANT
SYR MEDRAD MARK V 150ML (SYRINGE) ×2 IMPLANT
TRANSDUCER W/STOPCOCK (MISCELLANEOUS) ×2 IMPLANT
TRAY PV CATH (CUSTOM PROCEDURE TRAY) ×2 IMPLANT
WIRE HITORQ VERSACORE ST 145CM (WIRE) ×4 IMPLANT

## 2020-02-05 NOTE — Op Note (Addendum)
Procedure: Abdominal aortogram with bilateral lower extremity runoff, ultrasound right groin  Preoperative diagnosis: Claudication bilateral lower extremities  Postoperative diagnosis: Same  Anesthesia: Local  Operative findings: 1.  Flush occlusion origin left superficial femoral artery with reconstitution of the above-knee popliteal artery and 2-vessel runoff to the left foot  2.  Patent proximal right superficial femoral artery stent with mild to moderate proximal in-stent restenosis.  3.  Occlusion of distal native superficial femoral artery with diffusely diseased mid right superficial femoral artery  4.  Occlusion left anterior tibial artery  Operative details: After obtaining informed consent, the patient taken to the [facility] lab.  The patient was placed in supine position angio table.  Both groins were prepped and draped in usual sterile fashion.  Local anesthesia was infiltrated over the right common femoral artery.  Ultrasound was used to identify the right common femoral artery and femoral bifurcation.  An introducer needle was used to cannulate the right common femoral artery and an 035 versa core wire threaded up in the abdominal aorta under fluoroscopic guidance.  A 5 French sheath was placed over the guidewire into the right common femoral artery.  This was thoroughly flushed with heparinized saline.  A 5 French pigtail catheter was then placed over the guidewire in the abdominal aorta and abdominal aortogram was obtained in AP projection.  Left and right renal arteries are patent.  The infrarenal abdominal aorta is patent.  The left and right common external and internal iliac arteries are widely patent.  Next the pigtail catheter was pulled on distal of the aortic bifurcation and bilateral oblique views of the pelvis were performed confirming the above findings.  At this point bilateral lower extremity runoff views were obtained through the pigtail catheter.  In the left lower  extremity, the left common femoral profunda femoris is patent.  The left superficial femoral artery has a flush occlusion and the above-knee popliteal artery is reconstituted by profunda collaterals.  There is then to-vessel runoff to the left foot.  The left anterior tibial artery is occluded.  In the right lower extremity, the right common femoral and profunda femoris is patent.  There is a stent extending from the origin of the right superficial femoral artery about 4 cm distally.  This is patent.  There is about a 50% in-stent restenosis of the proximal aspect of the stent.  The right superficial femoral artery is diffusely diseased.  There are multiple segments of exophytic calcific plaque in the mid to distal third of the SFA.  There is then a short segment occlusion of about 4 cm at the level of the popliteal artery above the knee.  This is essentially at the adductor hiatus.  The above-knee popliteal artery then reconstitutes and gives off three-vessel runoff to the right foot.  At this point the pigtail catheter was removed over guidewire.  The 5 French sheath was left in place to be pulled in the holding area.  The patient tolerated seizure well and there were no complications.  Patient is taken the holding area in stable condition.  Operative management: The patient will be brought back to the [facility] lab on July 16 for a left groin puncture aortogram lower extremity runoff and atherectomy of the right superficial femoral artery possible stenting.  [place]-term plan would then be to consider left femoral to above-knee popliteal bypass if the patient wished to pursue this at some point in the future for his left leg claudication symptoms.  However he would prefer  a minimally invasive right leg intervention for now so that he misses a minimal amount of time from work.  Ruta Hinds, MD [facility] Office: [phone]

## 2020-02-05 NOTE — Discharge Instructions (Signed)
Angiogram, Care After This sheet gives you information about how to care for yourself after your procedure. Your health care provider may also give you more specific instructions. If you have problems or questions, contact your health care provider. What can I expect after the procedure? After the procedure, it is common to have bruising and tenderness at the catheter insertion area. Follow these instructions at home: Insertion site care  Follow instructions from your health care provider about how to take care of your insertion site. Make sure you: ? Wash your hands with soap and water before you change your bandage (dressing). If soap and water are not available, use hand sanitizer. ? Change your dressing as told by your health care provider. ? Leave stitches (sutures), skin glue, or adhesive strips in place. These skin closures may need to stay in place for 2 weeks or longer. If adhesive strip edges start to loosen and curl up, you may trim the loose edges. Do not remove adhesive strips completely unless your health care provider tells you to do that.  Do not take baths, swim, or use a hot tub until your health care provider approves.  You may shower 24-48 hours after the procedure or as told by your health care provider. ? Gently wash the site with plain soap and water. ? Pat the area dry with a clean towel. ? Do not rub the site. This may cause bleeding.  Do not apply powder or lotion to the site. Keep the site clean and dry.  Check your insertion site every day for signs of infection. Check for: ? Redness, swelling, or pain. ? Fluid or blood. ? Warmth. ? Pus or a bad smell. Activity  Rest as told by your health care provider, usually for 1-2 days.  Do not lift anything that is heavier than 10 lbs. (4.5 kg) or as told by your health care provider.  Do not drive for 24 hours if you were given a medicine to help you relax (sedative).  Do not drive or use heavy machinery while  taking prescription pain medicine. General instructions   Return to your normal activities as told by your health care provider, usually in about a week. Ask your health care provider what activities are safe for you.  If the catheter site starts bleeding, lie flat and put pressure on the site. If the bleeding does not stop, get help right away. This is a medical emergency.  Drink enough fluid to keep your urine clear or pale yellow. This helps flush the contrast dye from your body.  Take over-the-counter and prescription medicines only as told by your health care provider.  Keep all follow-up visits as told by your health care provider. This is important. Contact a health care provider if:  You have a fever or chills.  You have redness, swelling, or pain around your insertion site.  You have fluid or blood coming from your insertion site.  The insertion site feels warm to the touch.  You have pus or a bad smell coming from your insertion site.  You have bruising around the insertion site.  You notice blood collecting in the tissue around the catheter site (hematoma). The hematoma may be painful to the touch. Get help right away if:  You have severe pain at the catheter insertion area.  The catheter insertion area swells very fast.  The catheter insertion area is bleeding, and the bleeding does not stop when you hold steady pressure on the area.    The area near or just beyond the catheter insertion site becomes pale, cool, tingly, or numb. These symptoms may represent a serious problem that is an emergency. Do not wait to see if the symptoms will go away. Get medical help right away. Call your local emergency services (911 in the [place] Do not drive yourself to the hospital. Summary  After the procedure, it is common to have bruising and tenderness at the catheter insertion area.  After the procedure, it is important to rest and drink plenty of fluids.  Do not take baths,  swim, or use a hot tub until your health care provider says it is okay to do so. You may shower 24-48 hours after the procedure or as told by your health care provider.  If the catheter site starts bleeding, lie flat and put pressure on the site. If the bleeding does not stop, get help right away. This is a medical emergency. This information is not intended to replace advice given to you by your health care provider. Make sure you discuss any questions you have with your health care provider. Document Revised: 07/12/2017 Document Reviewed: 07/04/2016 Elsevier Patient Education  2020 [employer]vier Inc.  

## 2020-02-05 NOTE — Interval H&P Note (Signed)
History and Physical Interval Note:  02/05/2020 11:39 AM  Todd Weeks  has presented today for surgery, with the diagnosis of peripheral vascular disease.  The various methods of treatment have been discussed with the patient and family. After consideration of risks, benefits and other options for treatment, the patient has consented to  Procedure(s): ABDOMINAL AORTOGRAM W/LOWER EXTREMITY (N/A) as a surgical intervention.  The patient's history has been reviewed, patient examined, no change in status, stable for surgery.  I have reviewed the patient's chart and labs.  Questions were answered to the patient's satisfaction.     Ruta Hinds

## 2020-02-05 NOTE — CV Procedure (Signed)
Order for sheath removal verified per post procedural orders. Procedure explained to patient and right femoral  artery access site assessed: level 0, palpable dorsalis pedis and posterior tibial pulses. 5 [place] Sheath removed and manual pressure applied for 20 minutes. Pre, peri, & post procedural vitals: HR 66, RR 13, O2 Sat 98%, BP 174/77. 5 mg hydralazine given per MD order for hypertension, Pain level 0. Distal pulses remained intact after sheath removal. Access site level 0 and dressed with 4X4 gauze and tegaderm. Post procedural instructions discussed with return demonstration from patient.   Bedrest to begin at 13:40 for 4 hours

## 2020-02-08 ENCOUNTER — Telehealth: Payer: Self-pay | Admitting: Family Medicine

## 2020-02-08 ENCOUNTER — Encounter (HOSPITAL_COMMUNITY): Payer: Self-pay | Admitting: Vascular Surgery

## 2020-02-08 ENCOUNTER — Emergency Department (HOSPITAL_COMMUNITY)
Admission: EM | Admit: 2020-02-08 | Discharge: 2020-02-08 | Disposition: A | Discharge status: Home / Self Care | Payer: PPO | Attending: Emergency Medicine | Admitting: Emergency Medicine

## 2020-02-08 ENCOUNTER — Other Ambulatory Visit: Payer: Self-pay

## 2020-02-08 ENCOUNTER — Other Ambulatory Visit: Payer: PPO

## 2020-02-08 ENCOUNTER — Telehealth: Payer: Self-pay

## 2020-02-08 DIAGNOSIS — Z7902 Long term (current) use of antithrombotics/antiplatelets: Secondary | ICD-10-CM | POA: Diagnosis not present

## 2020-02-08 DIAGNOSIS — R6883 Chills (without fever): Secondary | ICD-10-CM

## 2020-02-08 DIAGNOSIS — Z20822 Contact with and (suspected) exposure to covid-19: Secondary | ICD-10-CM | POA: Diagnosis not present

## 2020-02-08 DIAGNOSIS — I2510 Atherosclerotic heart disease of native coronary artery without angina pectoris: Secondary | ICD-10-CM | POA: Insufficient documentation

## 2020-02-08 DIAGNOSIS — Z9889 Other specified postprocedural states: Secondary | ICD-10-CM | POA: Diagnosis not present

## 2020-02-08 DIAGNOSIS — R5383 Other fatigue: Secondary | ICD-10-CM | POA: Diagnosis not present

## 2020-02-08 DIAGNOSIS — Z79899 Other long term (current) drug therapy: Secondary | ICD-10-CM | POA: Insufficient documentation

## 2020-02-08 DIAGNOSIS — R531 Weakness: Secondary | ICD-10-CM | POA: Diagnosis not present

## 2020-02-08 DIAGNOSIS — I739 Peripheral vascular disease, unspecified: Secondary | ICD-10-CM | POA: Insufficient documentation

## 2020-02-08 DIAGNOSIS — R630 Anorexia: Secondary | ICD-10-CM | POA: Diagnosis not present

## 2020-02-08 DIAGNOSIS — I119 Hypertensive heart disease without heart failure: Secondary | ICD-10-CM | POA: Insufficient documentation

## 2020-02-08 LAB — URINALYSIS, ROUTINE W REFLEX MICROSCOPIC
Bilirubin Urine: NEGATIVE
Glucose, UA: NEGATIVE mg/dL
Hgb urine dipstick: NEGATIVE
Ketones, ur: NEGATIVE mg/dL
Leukocytes,Ua: NEGATIVE
Nitrite: NEGATIVE
Protein, ur: NEGATIVE mg/dL
Specific Gravity, Urine: 1.015 (ref 1.005–1.030)
pH: 5 (ref 5.0–8.0)

## 2020-02-08 LAB — COMPREHENSIVE METABOLIC PANEL
ALT: 38 U/L (ref 0–44)
AST: 27 U/L (ref 15–41)
Albumin: 4 g/dL (ref 3.5–5.0)
Alkaline Phosphatase: 65 U/L (ref 38–126)
Anion gap: 13 (ref 5–15)
BUN: 12 mg/dL (ref 8–23)
CO2: 20 mmol/L — ABNORMAL LOW (ref 22–32)
Calcium: 8.9 mg/dL (ref 8.9–10.3)
Chloride: 101 mmol/L (ref 98–111)
Creatinine, Ser: 0.64 mg/dL (ref 0.61–1.24)
GFR calc Af Amer: >60 mL/min (ref >60)
GFR calc non Af Amer: >60 mL/min (ref >60)
Glucose, Bld: 113 mg/dL — ABNORMAL HIGH (ref 70–99)
Potassium: 4 mmol/L (ref 3.5–5.1)
Sodium: 134 mmol/L — ABNORMAL LOW (ref 135–145)
Total Bilirubin: 1 mg/dL (ref 0.3–1.2)
Total Protein: 7 g/dL (ref 6.5–8.1)

## 2020-02-08 LAB — SARS CORONAVIRUS 2 BY RT PCR (HOSPITAL ORDER, PERFORMED IN ~~LOC~~ HOSPITAL LAB): SARS Coronavirus 2: NEGATIVE

## 2020-02-08 LAB — CBC WITH DIFFERENTIAL/PLATELET
Abs Immature Granulocytes: 0.04 10*3/uL (ref 0.00–0.07)
Basophils Absolute: 0 10*3/uL (ref 0.0–0.1)
Basophils Relative: 0 %
Eosinophils Absolute: 0.1 10*3/uL (ref 0.0–0.5)
Eosinophils Relative: 2 %
HCT: 45.7 % (ref 39.0–52.0)
Hemoglobin: 15.1 g/dL (ref 13.0–17.0)
Immature Granulocytes: 1 %
Lymphocytes Relative: 5 %
Lymphs Abs: 0.3 10*3/uL — ABNORMAL LOW (ref 0.7–4.0)
MCH: 30.3 pg (ref 26.0–34.0)
MCHC: 33 g/dL (ref 30.0–36.0)
MCV: 91.8 fL (ref 80.0–100.0)
Monocytes Absolute: 0.4 10*3/uL (ref 0.1–1.0)
Monocytes Relative: 8 %
Neutro Abs: 4.5 10*3/uL (ref 1.7–7.7)
Neutrophils Relative %: 84 %
Platelets: 172 10*3/uL (ref 150–400)
RBC: 4.98 MIL/uL (ref 4.22–5.81)
RDW: 12.4 % (ref 11.5–15.5)
WBC: 5.3 10*3/uL (ref 4.0–10.5)
nRBC: 0 % (ref 0.0–0.2)

## 2020-02-08 NOTE — ED Triage Notes (Signed)
Pt reports arteriogram on Friday, states on Saturday during the day he began having chills, feeling fatigued and lack of appetite.

## 2020-02-08 NOTE — Telephone Encounter (Signed)
We will get labs at [place]

## 2020-02-08 NOTE — Telephone Encounter (Signed)
Triage call from pt reporting chills, fever, nausea and headache since Saturday afternoon. He just left the ER and stated nothing was found wrong. His Covid testing is pending. Pt will f/u with PCP and advised if symptoms worsen to seek tx.

## 2020-02-08 NOTE — Telephone Encounter (Signed)
Patient called and had been seen at urgent care and urgent care had told him to follow up with his PCP but told him to get bloodwork done to check diabetic levels  Can you put in an order to due lab work or does he need to make an appointment  prior

## 2020-02-08 NOTE — ED Provider Notes (Signed)
[facility] EMERGENCY DEPARTMENT Provider Note   CSN: [id number] Arrival date & time: 02/08/20  8250     History Chief Complaint  Patient presents with  . Post-op Problem    Todd Weeks is a 64 y.o. male.  HPI Patient is a 64 year old male with medical history as noted below.  He has a significant history of PVD.  3 days ago he had a abdominal aortogram with bilateral lower extremity runoff performed by Dr. Ruta Hinds with vascular surgery (findings below).  He states that 2 days ago he began experiencing chills, decreased appetite, extreme fatigue, subjective fevers.  His chills seem to be worse at night.  He denies any other symptoms.  He denies any pain or erythema along the right thigh or inguinal region where the vascular team accessed.  No abdominal pain.  No headache, syncope, chest pain, shortness of breath, nausea, vomiting, diarrhea.  Abdominal aortogram with bilateral lower extremity runoff findings:  1.  Flush occlusion origin left superficial femoral artery with reconstitution of the above-knee popliteal artery and 2-vessel runoff to the left foot  2.  Patent proximal right superficial femoral artery stent with mild to moderate proximal in-stent restenosis.  3.  Occlusion of distal native superficial femoral artery with diffusely diseased mid right superficial femoral artery  4.  Occlusion left anterior tibial artery     Past Medical History:  Diagnosis Date  . Bell's palsy   . Closed nondisplaced fracture of neck of third metacarpal bone of right hand 02/04/2017  . Clotting disorder ([place])   . Depression   . Diverticulitis   . GERD (gastroesophageal reflux disease)   . Headache(784.0)   . HTN (hypertension)   . Hyperlipidemia   . Mild CAD 2012  . PVD (peripheral vascular disease) ([place])   . Sexual dysfunction     Patient Active Problem List   Diagnosis Date Noted  . BPH (benign prostatic hyperplasia) 08/21/2019  . Depression,  major, single episode, in partial remission ([place]) 08/04/2018  . Anxiety 06/12/2018  . GERD (gastroesophageal reflux disease) 05/01/2018  . Insulin resistance 09/03/2017  . Numbness of right anterior thigh 01/21/2017  . Lesion or mass of paranasal sinuses 01/18/2016  . Hyperlipidemia 05/04/2015  . Atherosclerosis of native arteries of extremity with intermittent claudication ([place]) 05/22/2012  . CAD (coronary artery disease) 02/02/2011  . Elevated PSA 08/06/2007  . Former smoker 08/01/2007  . HTN (hypertension) 08/01/2007    Past Surgical History:  Procedure Laterality Date  . ABDOMINAL AORTOGRAM W/LOWER EXTREMITY Bilateral 02/05/2020   Procedure: ABDOMINAL AORTOGRAM W/LOWER EXTREMITY;  Surgeon: Elam Dutch, MD;  Location: [facility];  Service: Cardiovascular;  Laterality: Bilateral;  . PROSTATE BIOPSY  2015      Family History  Problem Relation Age of Onset  . Hypertension Mother   . Deep vein thrombosis Father   . Diabetes Brother   . Tuberculosis Maternal Grandfather   . Emphysema Maternal Grandfather   . Lung disease Brother   . Asthma Son   . Rectal cancer Neg Hx   . Colon cancer Neg Hx     Social History   Tobacco Use  . Smoking status: Former Smoker    Packs/day: 1.50    Years: 46.00    Pack years: 69.00    Types: Cigarettes    Quit date: 12/12/2011    Years since quitting: 8.1  . Smokeless tobacco: Never Used  Vaping Use  . Vaping Use: Never used  Substance  Use Topics  . Alcohol use: Yes    Alcohol/week: 1.0 standard drink    Types: 1 Cans of beer per week    Comment: a couple of beer on a weekend  . Drug use: Yes    Types: Cocaine, Marijuana, "Crack" cocaine    Comment: november    Home Medications Prior to Admission medications   Medication Sig Start Date End Date Taking? Authorizing Provider  clopidogrel (PLAVIX) 75 MG tablet TAKE (1) TABLET BY MOUTH ONCE DAILY. Patient taking differently: Take 75 mg by mouth daily. TAKE (1) TABLET BY  MOUTH ONCE DAILY. 01/04/20   Vivi Barrack, MD  famotidine (PEPCID) 20 MG tablet One daily Patient taking differently: Take 20 mg by mouth daily. One daily 01/19/20   Vivi Barrack, MD  FLUoxetine (PROZAC) 20 MG capsule Take 1 capsule (20 mg total) by mouth every morning. 01/19/20   Vivi Barrack, MD  irbesartan (AVAPRO) 300 MG tablet Take 1 tablet (300 mg total) by mouth daily. 08/21/19   Vivi Barrack, MD  Multiple Vitamins-Minerals (MULTIVITAMIN WITH MINERALS) tablet Take 1 tablet by mouth daily.    [provider]  simvastatin (ZOCOR) 40 MG tablet Take 40 mg by mouth daily.    [provider]  tamsulosin (FLOMAX) 0.4 MG CAPS capsule Take 1 capsule (0.4 mg total) by mouth daily. 03/16/19   Briscoe Deutscher, DO    Allergies    Patient has no known allergies.  Review of Systems   Review of Systems  All other systems reviewed and are negative. Ten systems reviewed and are negative for acute change, except as noted in the HPI.   Physical Exam Updated Vital Signs BP 137/79 (BP Location: Right Arm)   Pulse 87   Temp 98.2 F (36.8 C) (Oral)   Resp 20   SpO2 97%   Physical Exam Vitals and nursing note reviewed.  Constitutional:      General: He is not in acute distress.    Appearance: Normal appearance. He is normal weight. He is not ill-appearing, toxic-appearing or diaphoretic.  HENT:     Head: Normocephalic and atraumatic.     Right Ear: External ear normal.     Left Ear: External ear normal.     Nose: Nose normal.     Mouth/Throat:     Mouth: Mucous membranes are moist.     Pharynx: Oropharynx is clear. No oropharyngeal exudate or posterior oropharyngeal erythema.  Eyes:     Extraocular Movements: Extraocular movements intact.  Cardiovascular:     Rate and Rhythm: Normal rate and regular rhythm.     Pulses: Normal pulses.     Heart sounds: Normal heart sounds. No murmur heard.  No friction rub. No gallop.      Comments: Palpable DP pulses noted bilaterally.   Bilateral lower extremities are warm to the touch.   Pulmonary:     Effort: Pulmonary effort is normal. No respiratory distress.     Breath sounds: Normal breath sounds. No stridor. No wheezing, rhonchi or rales.  Abdominal:     General: Abdomen is flat.     Palpations: Abdomen is soft.     Tenderness: There is no abdominal tenderness.     Comments: Abdomen is soft and nontender in all 4 quadrants with deep palpation.  Musculoskeletal:        General: Normal range of motion.     Cervical back: Normal range of motion and neck supple. No tenderness.  Skin:  General: Skin is warm and dry.     Comments: Mild ecchymosis noted along the right inguinal region.  No overlying erythema.  No red streaking.  No tenderness.  No inguinal lymphadenopathy noted.  Neurological:     General: No focal deficit present.     Mental Status: He is alert and oriented to person, place, and time.  Psychiatric:        Mood and Affect: Mood normal.        Behavior: Behavior normal.    ED Results / Procedures / Treatments   Labs (all labs ordered are listed, but only abnormal results are displayed) Labs Reviewed  COMPREHENSIVE METABOLIC PANEL - Abnormal; Notable for the following components:      Result Value   Sodium 134 (*)    CO2 20 (*)    Glucose, Bld 113 (*)    All other components within normal limits  CBC WITH DIFFERENTIAL/PLATELET - Abnormal; Notable for the following components:   Lymphs Abs 0.3 (*)    All other components within normal limits  SARS CORONAVIRUS 2 BY RT PCR (HOSPITAL ORDER, [facility])  URINALYSIS, ROUTINE W REFLEX MICROSCOPIC   EKG None  Radiology No results found.  Procedures Procedures   Medications Ordered in ED Medications - No data to display  ED Course  I have reviewed the triage vital signs and the nursing notes.  Pertinent labs & imaging results that were available during my care of the patient were reviewed by me and considered  in my medical decision making (see chart for details).    MDM Rules/Calculators/A&P                          Patient is a 64 year old male with a medical history and physical exam as noted above.  Physical exam is very reassuring.  No findings noted along the right inguinal region where his procedure was performed 3 days ago.  There was some mild ecchymosis but otherwise no erythema or edema.  No tenderness.  No inguinal lymphadenopathy.  No abdominal tenderness.  Patient has no fever today.  His labs are quite reassuring.  No leukocytosis.  UA benign.  Mild hyponatremia at 134.  COVID-19 test in progress.  Will have patient follow-up on his results on MyChart.  Recommended that the patient purchase a thermometer and monitor his temperature.  He was also evaluated by my attending physician Dr. Lennice Sites.  Patient given strict return precautions.  His questions were answered and he was amicable at the time of discharge.  His vital signs are stable.  Patient discharged to home/self care.  Condition at discharge: Stable  Note: Portions of this report may have been transcribed using voice recognition software. Every effort was made to ensure accuracy; however, inadvertent computerized transcription errors may be present.    Final Clinical Impression(s) / ED Diagnoses Final diagnoses:  Fatigue, unspecified type  Chills (without fever)  Decreased appetite   Rx / DC Orders ED Discharge Orders    None       Rayna Sexton, PA-C 02/08/20 [place], [place], DO 02/08/20 1631

## 2020-02-18 ENCOUNTER — Other Ambulatory Visit: Payer: Self-pay

## 2020-02-18 ENCOUNTER — Encounter: Payer: Self-pay | Admitting: Family Medicine

## 2020-02-18 ENCOUNTER — Ambulatory Visit (INDEPENDENT_AMBULATORY_CARE_PROVIDER_SITE_OTHER): Payer: PPO | Admitting: Family Medicine

## 2020-02-18 VITALS — BP 140/78 | HR 60 | Temp 98.3°F | Ht 72.0 in | Wt 256.6 lb

## 2020-02-18 DIAGNOSIS — E782 Mixed hyperlipidemia: Secondary | ICD-10-CM | POA: Diagnosis not present

## 2020-02-18 DIAGNOSIS — L97519 Non-pressure chronic ulcer of other part of right foot with unspecified severity: Secondary | ICD-10-CM

## 2020-02-18 DIAGNOSIS — R739 Hyperglycemia, unspecified: Secondary | ICD-10-CM | POA: Diagnosis not present

## 2020-02-18 DIAGNOSIS — I70219 Atherosclerosis of native arteries of extremities with intermittent claudication, unspecified extremity: Secondary | ICD-10-CM | POA: Diagnosis not present

## 2020-02-18 LAB — HEMOGLOBIN A1C: Hgb A1c MFr Bld: 5.9 % (ref 4.6–6.5)

## 2020-02-18 MED ORDER — BACITRACIN 500 UNIT/GM EX OINT
1.0000 | TOPICAL_OINTMENT | Freq: Two times a day (BID) | CUTANEOUS | 0 refills | Status: DC
Start: 2020-02-18 — End: 2020-12-08

## 2020-02-18 NOTE — Assessment & Plan Note (Signed)
Continue statin and Plavix per vascular surgery.  Will be having surgery later this month.

## 2020-02-18 NOTE — Progress Notes (Signed)
Please inform patient of the following:  A1c is in the prediabetic range as it has been in the past but he does not have diabetes. I think his toe ulcer is due to a blood flow issue. Do not need to do any further testing at this point. Would like for him to continue working on diet and exercise as much as possible and we can recheck his blood sugar with his next set of labs.

## 2020-02-18 NOTE — Assessment & Plan Note (Signed)
Continue simvastatin 40 mg daily. 

## 2020-02-18 NOTE — Progress Notes (Signed)
   Todd Weeks is a 64 y.o. male who presents today for an office visit.  Assessment/Plan:  New/Acute Problems: Toe Ulcer  [place] secondary to arterial insufficiency in setting of known PAD.  No signs of systemic illness.  Will check x-ray to rule out osteomyelitis.  Anticipate improvement after his revascularization surgery.  Will check A1c per request  Chronic Problems Addressed Today: Hyperlipidemia Continue simvastatin 40 mg daily.    Atherosclerosis of native arteries of extremity with intermittent claudication Continue statin and Plavix per vascular surgery.  Will be having surgery later this month.  Hyperglycemia Check A1c.      Subjective:  HPI:  Patient here with ulcer on right second toe.  Started 4 to 5 weeks ago.  Has had quite a bit of pain.  No fevers.  No drainage.  Went to urgent care.  They were concerned about diabetes and told him to follow-up to get checked.  He has a known history of peripheral artery disease.  He has been following with vascular surgery and will have revascularization surgery later this month.       Objective:  Physical Exam: BP 140/78   Pulse 60   Temp 98.3 F (36.8 C) (Temporal)   Ht 6' (1.829 m)   Wt 256 lb 9.6 oz (116.4 kg)   SpO2 95%   BMI 34.80 kg/m   Gen: No acute distress, resting comfortably Skin: Approximately 4 to 5 mm shallow ulceration on medial aspect of right second toe.  No surrounding erythema.  No purulent drainage. Neuro: Grossly normal, moves all extremities Psych: Normal affect and thought content      Dorian Renfro M. Jerline Pain, MD 02/18/2020 8:22 AM

## 2020-02-18 NOTE — Patient Instructions (Signed)
It was very nice to see you today!  I think the ulcer on your foot is more likely due to a blood flow issue and not diabetes.  Please use a small amount of antibiotic ointment with a Band-Aid to keep it from getting infected.  We will check your blood work today to make sure that you do not have diabetes.  Please go to the ER on [address] office to have your x-ray done when it is convenient for you.  Take care, Dr Jerline Pain  Please try these tips to maintain a healthy lifestyle:   Eat at least 3 REAL meals and 1-2 snacks per day.  Aim for no more than 5 hours between eating.  If you eat breakfast, please do so within one hour of getting up.    Each meal should contain half fruits/vegetables, one quarter protein, and one quarter carbs (no bigger than a computer mouse)   Cut down on sweet beverages. This includes juice, soda, and sweet tea.     Drink at least 1 glass of water with each meal and aim for at least 8 glasses per day   Exercise at least 150 minutes every week.

## 2020-02-18 NOTE — Assessment & Plan Note (Signed)
Check A1c. 

## 2020-02-25 ENCOUNTER — Ambulatory Visit (INDEPENDENT_AMBULATORY_CARE_PROVIDER_SITE_OTHER)
Admission: RE | Admit: 2020-02-25 | Discharge: 2020-02-25 | Disposition: A | Discharge status: Home / Self Care | Payer: PPO | Source: Ambulatory Visit | Attending: Family Medicine | Admitting: Family Medicine

## 2020-02-25 ENCOUNTER — Other Ambulatory Visit: Payer: Self-pay

## 2020-02-25 DIAGNOSIS — L97519 Non-pressure chronic ulcer of other part of right foot with unspecified severity: Secondary | ICD-10-CM

## 2020-02-29 NOTE — Progress Notes (Signed)
Please inform patient of the following:  Xray is normal. Does not have any signs of fracture or bone involvement in his ulcer.  [name]. Jerline Pain, MD 02/29/2020 2:57 PM

## 2020-03-07 ENCOUNTER — Ambulatory Visit: Payer: PPO | Admitting: Podiatry

## 2020-03-07 ENCOUNTER — Other Ambulatory Visit: Payer: Self-pay

## 2020-03-07 ENCOUNTER — Ambulatory Visit: Payer: PPO

## 2020-03-07 ENCOUNTER — Encounter: Payer: Self-pay | Admitting: Podiatry

## 2020-03-07 ENCOUNTER — Ambulatory Visit (INDEPENDENT_AMBULATORY_CARE_PROVIDER_SITE_OTHER): Payer: PPO

## 2020-03-07 DIAGNOSIS — L97519 Non-pressure chronic ulcer of other part of right foot with unspecified severity: Secondary | ICD-10-CM | POA: Diagnosis not present

## 2020-03-07 DIAGNOSIS — M2041 Other hammer toe(s) (acquired), right foot: Secondary | ICD-10-CM | POA: Diagnosis not present

## 2020-03-07 DIAGNOSIS — L84 Corns and callosities: Secondary | ICD-10-CM | POA: Diagnosis not present

## 2020-03-07 DIAGNOSIS — I999 Unspecified disorder of circulatory system: Secondary | ICD-10-CM | POA: Diagnosis not present

## 2020-03-07 NOTE — Progress Notes (Signed)
Subjective:   Patient ID: Todd Weeks, male   DOB: 64 y.o.   MRN: [id number]   HPI Patient presents stating he is due to be revascularized again in about 2 weeks and he just has a lot of pain in his feet and is developed some breakdown on the second digit of his right foot.  States it is localized to this area   ROS      Objective:  Physical Exam  Vascular status indicates no palpable pulses DP PT bilateral with keratotic lesion subfifth metatarsals that can be sore and a lesion on the inside of the second digit right that painful when palpated and seems to be the origin of where most of his problem is coming from     Assessment:  Localized ulceration digit to right with no proximal edema erythema drainage with vascular disease complicating factor and corn callus formation bilateral     Plan:  H&P educated him on vascular disease and its relationship to his problem and the fact that ultimately he may require a amputation of the digit.  He is getting revascularized I am hopeful that will help him and today I debrided lesions to try to get relief and then I went ahead and applied padding to the second digit right to reduce pressure between the big toe and second toe and advised on soaks and continued padding usage.  Reappoint to recheck  X-rays indicated there is pressure between the hallux second digit right but I did not note any osteolysis,

## 2020-03-25 ENCOUNTER — Ambulatory Visit (HOSPITAL_COMMUNITY)
Admission: RE | Admit: 2020-03-25 | Discharge: 2020-03-25 | Disposition: A | Discharge status: Home / Self Care | Payer: PPO | Attending: Vascular Surgery | Admitting: Vascular Surgery

## 2020-03-25 ENCOUNTER — Telehealth: Payer: Self-pay

## 2020-03-25 ENCOUNTER — Encounter (HOSPITAL_COMMUNITY)
Admission: RE | Disposition: A | Discharge status: Home / Self Care | Payer: Self-pay | Source: Home / Self Care | Attending: Vascular Surgery

## 2020-03-25 DIAGNOSIS — E785 Hyperlipidemia, unspecified: Secondary | ICD-10-CM | POA: Diagnosis not present

## 2020-03-25 DIAGNOSIS — I10 Essential (primary) hypertension: Secondary | ICD-10-CM | POA: Diagnosis not present

## 2020-03-25 DIAGNOSIS — I70235 Atherosclerosis of native arteries of right leg with ulceration of other part of foot: Secondary | ICD-10-CM | POA: Diagnosis not present

## 2020-03-25 DIAGNOSIS — Z7902 Long term (current) use of antithrombotics/antiplatelets: Secondary | ICD-10-CM | POA: Diagnosis not present

## 2020-03-25 DIAGNOSIS — K219 Gastro-esophageal reflux disease without esophagitis: Secondary | ICD-10-CM | POA: Diagnosis not present

## 2020-03-25 DIAGNOSIS — Z79899 Other long term (current) drug therapy: Secondary | ICD-10-CM | POA: Diagnosis not present

## 2020-03-25 DIAGNOSIS — I70213 Atherosclerosis of native arteries of extremities with intermittent claudication, bilateral legs: Secondary | ICD-10-CM | POA: Insufficient documentation

## 2020-03-25 DIAGNOSIS — Z87891 Personal history of nicotine dependence: Secondary | ICD-10-CM | POA: Insufficient documentation

## 2020-03-25 DIAGNOSIS — F329 Major depressive disorder, single episode, unspecified: Secondary | ICD-10-CM | POA: Insufficient documentation

## 2020-03-25 DIAGNOSIS — L97519 Non-pressure chronic ulcer of other part of right foot with unspecified severity: Secondary | ICD-10-CM | POA: Diagnosis not present

## 2020-03-25 DIAGNOSIS — I2510 Atherosclerotic heart disease of native coronary artery without angina pectoris: Secondary | ICD-10-CM | POA: Diagnosis not present

## 2020-03-25 DIAGNOSIS — Z539 Procedure and treatment not carried out, unspecified reason: Secondary | ICD-10-CM | POA: Diagnosis not present

## 2020-03-25 DIAGNOSIS — U071 COVID-19: Secondary | ICD-10-CM | POA: Insufficient documentation

## 2020-03-25 LAB — SARS CORONAVIRUS 2 BY RT PCR (HOSPITAL ORDER, PERFORMED IN ~~LOC~~ HOSPITAL LAB): SARS Coronavirus 2: POSITIVE — AB

## 2020-03-25 LAB — POCT I-STAT, CHEM 8
BUN: 22 mg/dL (ref 8–23)
Calcium, Ion: 1.16 mmol/L (ref 1.15–1.40)
Chloride: 104 mmol/L (ref 98–111)
Creatinine, Ser: 0.6 mg/dL — ABNORMAL LOW (ref 0.61–1.24)
Glucose, Bld: 105 mg/dL — ABNORMAL HIGH (ref 70–99)
HCT: 36 % — ABNORMAL LOW (ref 39.0–52.0)
Hemoglobin: 12.2 g/dL — ABNORMAL LOW (ref 13.0–17.0)
Potassium: 3.9 mmol/L (ref 3.5–5.1)
Sodium: 139 mmol/L (ref 135–145)
TCO2: 24 mmol/L (ref 22–32)

## 2020-03-25 SURGERY — ABDOMINAL AORTOGRAM W/LOWER EXTREMITY
Anesthesia: LOCAL

## 2020-03-25 MED ORDER — SODIUM CHLORIDE 0.9 % IV SOLN: INTRAVENOUS | Status: DC

## 2020-03-25 NOTE — Telephone Encounter (Signed)
-----   Message from Sunday Shams, RN sent at 03/25/2020 10:14 AM EDT ----- Regarding: Reschedule Todd Weeks,  Todd Weeks came in today for procedure.  He got covid swabbed this am, he is +.  Procedure is canceled.  Please reschedule him for 21 days out.  He did have a low grade fever 99.4  Thanks  [employer]

## 2020-03-25 NOTE — H&P (Signed)
Referring Physician: Dimas Chyle  Patient name: Todd Weeks  MRN: [id number]        DOB: September 23, 1955            Sex: male  REASON FOR CONSULT: Claudication  HPI: Todd Weeks is a 64 y.o. male, with a known history of lower extremity claudication right leg worse than the left. He was seen several years ago in 2013 with mainly right leg claudication symptoms. At that time he did not have insurance so he did not want to undergo an intervention. He has since that time tried to walk and improve his walking distance. However he still has difficulty moving around with his right leg causing claudication after about 200 feet. He states the left leg is not far behind his. He also has some numbness and tingling in his right second toe. Per his chart he had a right external iliac artery stent in 2002. He also previously had a right superficial femoral artery stent by Dr. Angelena Form several years ago. He currently has a blister which has not healed on his right second toe. IT has now ruptured and has a non healing ulcer.  Other medical problems include hypertension hyperlipidemia coronary artery disease all of which have been stable. He has not smoked for 8 years. He is on a statin and Plavix. He feels very disabled by his inability to be able to walk [place] distances.        Current Outpatient Medications on File Prior to Visit  Medication Sig Dispense Refill  . AFLURIA QUADRIVALENT 0.5 ML injection     . atorvastatin (LIPITOR) 40 MG tablet Take 1 tablet (40 mg total) by mouth daily. 90 tablet 3  . clopidogrel (PLAVIX) 75 MG tablet TAKE (1) TABLET BY MOUTH ONCE DAILY. 90 tablet 0  . famotidine (PEPCID) 20 MG tablet One daily 90 tablet 3  . FLUoxetine (PROZAC) 20 MG capsule Take 1 capsule (20 mg total) by mouth every morning. 90 capsule 0  . irbesartan (AVAPRO) 300 MG tablet Take 1 tablet (300 mg total) by mouth daily. 90 tablet 3  . tamsulosin (FLOMAX) 0.4 MG CAPS capsule Take 1 capsule (0.4 mg total) by  mouth daily. 30 capsule 3   No current facility-administered medications on file prior to visit.         Past Medical History:  Diagnosis Date  . Bell's palsy   . Closed nondisplaced fracture of neck of third metacarpal bone of right hand 02/04/2017  . Clotting disorder ([place])   . Depression   . Diverticulitis   . GERD (gastroesophageal reflux disease)   . Headache(784.0)   . HTN (hypertension)   . Hyperlipidemia   . Mild CAD 2012  . PVD (peripheral vascular disease) ([place])   . Sexual dysfunction         Past Surgical History:  Procedure Laterality Date  . PROSTATE BIOPSY  2015         Family History  Problem Relation Age of Onset  . Hypertension Mother   . Deep vein thrombosis Father   . Diabetes Brother   . Tuberculosis Maternal Grandfather   . Emphysema Maternal Grandfather   . Lung disease Brother   . Asthma Son   . Rectal cancer Neg Hx   . Colon cancer Neg Hx     SOCIAL HISTORY: Social History        Socioeconomic History  . Marital status: Single    Spouse name: Not on file  .  Number of children: 2  . Years of education: 66  . Highest education level: Not on file  Occupational History  . Occupation: Electrical engineer  . Occupation: Unemployed    Comment: disability  Tobacco Use  . Smoking status: Former Smoker    Packs/day: 1.50    Years: 46.00    Pack years: 69.00    Types: Cigarettes    Quit date: 12/12/2011    Years since quitting: 8.1  . Smokeless tobacco: Never Used  Vaping Use  . Vaping Use: Never used  Substance and Sexual Activity  . Alcohol use: Yes    Alcohol/week: 1.0 standard drink    Types: 1 Cans of beer per week    Comment: a couple of beer on a weekend  . Drug use: Yes    Types: Cocaine, Marijuana, "Crack" cocaine    Comment: november  . Sexual activity: Not on file  Other Topics Concern  . Not on file  Social History Narrative   Raised in [place], [place]. Does not  have any religious beliefs that would effect healthcare. Lives in house with sister. Likes to ride motorcycle for fun.    Moving out on his own; in the next couple of weeks will be moving back home that he had rented      [employer]; with a helmet   Social Determinants of Health      Financial Resource Strain:   . Difficulty of Paying Living Expenses:   Food Insecurity:   . Worried About Charity fundraiser in the Last Year:   . Arboriculturist in the Last Year:   Transportation Needs:   . Film/video editor (Medical):   Marland Kitchen Lack of Transportation (Non-Medical):   Physical Activity:   . Days of Exercise per Week:   . Minutes of Exercise per Session:   Stress:   . Feeling of Stress :   Social Connections:   . Frequency of Communication with Friends and Family:   . Frequency of Social Gatherings with Friends and Family:   . Attends Religious Services:   . Active Member of Clubs or Organizations:   . Attends Archivist Meetings:   Marland Kitchen Marital Status:   Intimate Partner Violence:   . Fear of Current or Ex-Partner:   . Emotionally Abused:   Marland Kitchen Physically Abused:   . Sexually Abused:     No Known Allergies        Current Outpatient Medications  Medication Sig Dispense Refill  . AFLURIA QUADRIVALENT 0.5 ML injection     . atorvastatin (LIPITOR) 40 MG tablet Take 1 tablet (40 mg total) by mouth daily. 90 tablet 3  . clopidogrel (PLAVIX) 75 MG tablet TAKE (1) TABLET BY MOUTH ONCE DAILY. 90 tablet 0  . famotidine (PEPCID) 20 MG tablet One daily 90 tablet 3  . FLUoxetine (PROZAC) 20 MG capsule Take 1 capsule (20 mg total) by mouth every morning. 90 capsule 0  . irbesartan (AVAPRO) 300 MG tablet Take 1 tablet (300 mg total) by mouth daily. 90 tablet 3  . tamsulosin (FLOMAX) 0.4 MG CAPS capsule Take 1 capsule (0.4 mg total) by mouth daily. 30 capsule 3   No current facility-administered medications for this visit.    ROS:   General:  No weight loss,  Fever, chills  HEENT: No recent headaches, no nasal bleeding, no visual changes, no sore throat  Neurologic: No dizziness, blackouts, seizures. No recent symptoms of stroke or mini- stroke. No recent  episodes of slurred speech, or temporary blindness.  Cardiac: No recent episodes of chest pain/pressure, no shortness of breath at rest.  No shortness of breath with exertion.  Denies history of atrial fibrillation or irregular heartbeat  Vascular: No history of rest pain in feet.  No history of claudication.  No history of non-healing ulcer, No history of DVT   Pulmonary: No home oxygen, no productive cough, no hemoptysis,  No asthma or wheezing  Musculoskeletal:  [ ]  Arthritis, [ ]  Low back pain,  [ ]  Joint pain  Hematologic:No history of hypercoagulable state.  No history of easy bleeding.  No history of anemia  Gastrointestinal: No hematochezia or melena,  No gastroesophageal reflux, no trouble swallowing  Urinary: [ ]  chronic Kidney disease, [ ]  on HD - [ ]  MWF or [ ]  TTHS, [ ]  Burning with urination, [ ]  Frequent urination, [ ]  Difficulty urinating;   Skin: No rashes  Psychological: No history of anxiety,  No history of depression   Physical Examination  Vitals:   03/25/20 0549  BP: (!) 148/73  Pulse: 67  Resp: 17  Temp: 99.4 F (37.4 C)  TempSrc: Oral  SpO2: 97%  Weight: 113.4 kg  Height: 6' (1.829 m)    Body mass index is 34.58 kg/m.  General:  Alert and oriented, no acute distress HEENT: Normal Neck: No bruit or JVD Pulmonary: Clear to auscultation bilaterally Cardiac: Regular Rate and Rhythm Abdomen: Soft, non-tender, non-distended, no mass, no scars Skin: No rash, 2 mm ulcer on 2nd toe between first and second toe Extremity Pulses:  2+ radial, brachial, femoral, absent dorsalis pedis, posterior tibial pulses bilaterally Musculoskeletal: No deformity or edema      Neurologic: Upper and lower extremity motor 5/5 and symmetric     DATA:    Patient had bilateral ABIs performed 6/10 which I reviewed and interpreted. Right side was 0.71 left side 0.89 essentially no change since February 2021.  ASSESSMENT: Bilateral lower extremity claudication right worse than left. Now with non healing wound right foot.  Prior diagnostic agram shows significant right SFA stenosis.  He has a flush occlusion on the left SFA  PLAN: Atherectomy and possible stent right SFA today.  Risk benefits possible complications of procedure details were discussed with the patient today including not limited to bleeding infection vessel injury contrast reaction patient understands and agrees to proceed.  Ruta Hinds, MD [facility] Office: [phone]

## 2020-03-25 NOTE — Progress Notes (Signed)
Patient was found to have a positive Covid test on his preoperative testing today.  His procedure was canceled.  He has a small ulceration between his toes and this should be able to wait for a few weeks until he is Covid clear to reduce exposure to personnel.  Patient will be scheduled for his aortogram lower extremity runoff possible atherectomy of his right leg in 3 weeks.  Ruta Hinds, MD [facility] Office: [phone]

## 2020-04-01 ENCOUNTER — Telehealth: Payer: Self-pay | Admitting: *Deleted

## 2020-04-01 NOTE — Telephone Encounter (Signed)
Left VM for pt to call back to reschedule procedure after positive covid test

## 2020-04-01 NOTE — Telephone Encounter (Signed)
Patient LVMTCB. Message given to [facility]

## 2020-04-04 ENCOUNTER — Other Ambulatory Visit: Payer: Self-pay

## 2020-04-08 ENCOUNTER — Telehealth: Payer: Self-pay

## 2020-04-08 NOTE — Telephone Encounter (Signed)
12:22pm:  Patient LVM requesting a call back with Surgery Details. Message given to [facility] ([place]

## 2020-04-27 ENCOUNTER — Other Ambulatory Visit (HOSPITAL_COMMUNITY): Payer: PPO

## 2020-05-05 ENCOUNTER — Other Ambulatory Visit (HOSPITAL_COMMUNITY): Payer: Self-pay

## 2020-05-05 ENCOUNTER — Other Ambulatory Visit (HOSPITAL_COMMUNITY): Payer: PPO

## 2020-05-05 DIAGNOSIS — H33031 Retinal detachment with giant retinal tear, right eye: Secondary | ICD-10-CM | POA: Diagnosis not present

## 2020-05-05 NOTE — Progress Notes (Signed)
Pt not tested for covid today Thurs 9/23 due to pt testing + for covid on 03/25/20 (results in Epic). Based on the guidelines the pt is in the 90 day window to not retest. This appointment will be cancelled. The pt is still expected to quarantine until their procedure. Therefore, the pt can still have the scheduled procedure.   These are the guidelines as follows:  Guidance: Patient previously tested + COVID; now past 90 day window seeking elective surgery (asymptomatic)  Retest patient If negative, proceed with surgery If positive, postpone surgery for 10 days from positive test Patient to quarantine for the (10 days) Do not retest again prior to surgery (even if scheduled a couple of weeks out) Use standard precautions for surgery

## 2020-05-06 ENCOUNTER — Encounter (INDEPENDENT_AMBULATORY_CARE_PROVIDER_SITE_OTHER): Payer: Self-pay | Admitting: Ophthalmology

## 2020-05-06 ENCOUNTER — Ambulatory Visit (HOSPITAL_COMMUNITY)
Admission: RE | Admit: 2020-05-06 | Discharge: 2020-05-06 | Disposition: A | Discharge status: Home / Self Care | Payer: PPO | Attending: Vascular Surgery | Admitting: Vascular Surgery

## 2020-05-06 ENCOUNTER — Encounter (HOSPITAL_COMMUNITY)
Admission: RE | Disposition: A | Discharge status: Home / Self Care | Payer: Self-pay | Source: Home / Self Care | Attending: Vascular Surgery

## 2020-05-06 ENCOUNTER — Ambulatory Visit (INDEPENDENT_AMBULATORY_CARE_PROVIDER_SITE_OTHER): Payer: PPO | Admitting: Ophthalmology

## 2020-05-06 ENCOUNTER — Other Ambulatory Visit: Payer: Self-pay

## 2020-05-06 DIAGNOSIS — H25813 Combined forms of age-related cataract, bilateral: Secondary | ICD-10-CM

## 2020-05-06 DIAGNOSIS — H3581 Retinal edema: Secondary | ICD-10-CM | POA: Diagnosis not present

## 2020-05-06 DIAGNOSIS — Z539 Procedure and treatment not carried out, unspecified reason: Secondary | ICD-10-CM | POA: Insufficient documentation

## 2020-05-06 DIAGNOSIS — H33312 Horseshoe tear of retina without detachment, left eye: Secondary | ICD-10-CM | POA: Diagnosis not present

## 2020-05-06 DIAGNOSIS — H35033 Hypertensive retinopathy, bilateral: Secondary | ICD-10-CM

## 2020-05-06 DIAGNOSIS — I70219 Atherosclerosis of native arteries of extremities with intermittent claudication, unspecified extremity: Secondary | ICD-10-CM

## 2020-05-06 DIAGNOSIS — I10 Essential (primary) hypertension: Secondary | ICD-10-CM

## 2020-05-06 DIAGNOSIS — H3321 Serous retinal detachment, right eye: Secondary | ICD-10-CM

## 2020-05-06 HISTORY — PX: EYE SURGERY: SHX253

## 2020-05-06 HISTORY — PX: RETINAL DETACHMENT SURGERY: SHX105

## 2020-05-06 LAB — POCT I-STAT, CHEM 8
BUN: 20 mg/dL (ref 8–23)
Calcium, Ion: 1.17 mmol/L (ref 1.15–1.40)
Chloride: 105 mmol/L (ref 98–111)
Creatinine, Ser: 0.7 mg/dL (ref 0.61–1.24)
Glucose, Bld: 98 mg/dL (ref 70–99)
HCT: 42 % (ref 39.0–52.0)
Hemoglobin: 14.3 g/dL (ref 13.0–17.0)
Potassium: 4.2 mmol/L (ref 3.5–5.1)
Sodium: 143 mmol/L (ref 135–145)
TCO2: 24 mmol/L (ref 22–32)

## 2020-05-06 SURGERY — ABDOMINAL AORTOGRAM W/LOWER EXTREMITY
Anesthesia: LOCAL

## 2020-05-06 MED ORDER — PREDNISOLONE ACETATE 1 % OP SUSP: 1.0000 [drp] | Freq: Four times a day (QID) | OPHTHALMIC | 0 refills | Status: AC

## 2020-05-06 MED ORDER — NEOMYCIN-POLYMYXIN-DEXAMETH 3.5-10000-0.1 OP OINT: 1.0000 "application " | TOPICAL_OINTMENT | Freq: Four times a day (QID) | OPHTHALMIC | 2 refills | Status: DC

## 2020-05-06 MED ORDER — SODIUM CHLORIDE 0.9 % IV SOLN: INTRAVENOUS | Status: DC

## 2020-05-06 NOTE — Progress Notes (Addendum)
Patient found to have a detached retina yesterday.  His ophthalmologist originally scheduled him for a procedure to fix this today but he wanted to have his lower extremity angiogram done first.  He is scheduled to have his retina fixed on Monday.  I emphasized to him that if we do an intervention on him it will be very important to not discontinue his Plavix for at least 1 month.  He is going to call his ophthalmologist this morning to see if he can have the procedure done while on Plavix.  We will delay his procedure until later this morning pending his ophthalmologist opinion.  Ruta Hinds, MD [facility] Office: [phone]

## 2020-05-06 NOTE — Progress Notes (Signed)
Pt has decided to have detached retina fixed today instead.  Will set up office visit in a few weeks to reschedule procedure  Ruta Hinds, MD [facility] Office: [phone]

## 2020-05-06 NOTE — Progress Notes (Signed)
[facility] Note  05/06/2020     CHIEF COMPLAINT Patient presents for Retina Evaluation   HISTORY OF PRESENT ILLNESS: Todd Weeks is a 64 y.o. male who presents to the clinic today for:   HPI    Retina Evaluation    In right eye.  This started 1 day ago.  I, the attending physician,  performed the HPI with the patient and updated documentation appropriately.          Comments    Patient here for retina evaluation for poss RD OD. Patient states vision lousy in OD. OS is good. Sees peripherally OD. Started yesterday am. No eye pain. Was in hospital this am for sx. It was cx'd because of a medicine taking.       Last edited by Bernarda Caffey, MD on 05/06/2020 12:59 PM. (History)    pt states he woke up yesterday morning and got in the shower, he states VA was fine at that point, when he got out of the shower he was unable to see, he thought it was bc he didn't have his glasses on, so he put them on and still couldn't see, pt states he had a regular eye exam last month and everything was fine, he states yesterday he saw Dr. Ronnald Ramp at [facility] who told him that he was detached and needed sx right away, Dr. Ronnald Ramp referred him to a surgeon who he was supposed to see on Monday (05/09/20), pt cannot remember the name of the surgeon, pt was scheduled for vascular sx today to get stents in his legs, but cancelled that surgery to take care of his eye, pt states he was on Plavix for the sx, but has not been on it for 3 days, pt denies being diabetic, pt states he does take medication for high blood pressure and he was a "slight case" of COPD  Referring physician: Vivi Barrack, MD [place],  [place] [address]  HISTORICAL INFORMATION:   Selected notes from the MEDICAL RECORD NUMBER Referred by Dr. Truman Hayward:  Ocular Hx- PMH-    CURRENT MEDICATIONS: No current facility-administered medications for this visit. (Ophthalmic Drugs)   Current Outpatient  Medications (Ophthalmic Drugs)  Medication Sig  . neomycin-polymyxin b-dexamethasone (MAXITROL) 3.5-10000-0.1 OINT Place 1 application into the right eye 4 (four) times daily.  . prednisoLONE acetate (PRED FORTE) 1 % ophthalmic suspension Place 1 drop into the left eye 4 (four) times daily for 7 days.  . prednisoLONE acetate (PRED FORTE) 1 % ophthalmic suspension Place 1 drop into the left eye 4 (four) times daily for 7 days.   No current facility-administered medications for this visit. (Other)   No current outpatient medications on file. (Other)   Facility-Administered Medications Ordered in Other Visits (Other)  Medication Route  . 0.9 %  sodium chloride infusion Intravenous      REVIEW OF SYSTEMS: ROS    Positive for: Eyes   Last edited by Theodore Demark, COA on 05/06/2020  9:18 AM. (History)       ALLERGIES No Known Allergies  PAST MEDICAL HISTORY Past Medical History:  Diagnosis Date  . Bell's palsy   . Closed nondisplaced fracture of neck of third metacarpal bone of right hand 02/04/2017  . Clotting disorder ([place])   . Depression   . Diverticulitis   . GERD (gastroesophageal reflux disease)   . Headache(784.0)   . HTN (hypertension)   . Hyperlipidemia   . Mild  CAD 2012  . PVD (peripheral vascular disease) ([place])   . Sexual dysfunction    Past Surgical History:  Procedure Laterality Date  . ABDOMINAL AORTOGRAM W/LOWER EXTREMITY Bilateral 02/05/2020   Procedure: ABDOMINAL AORTOGRAM W/LOWER EXTREMITY;  Surgeon: Elam Dutch, MD;  Location: [facility];  Service: Cardiovascular;  Laterality: Bilateral;  . PROSTATE BIOPSY  2015    FAMILY HISTORY Family History  Problem Relation Age of Onset  . Hypertension Mother   . Deep vein thrombosis Father   . Diabetes Brother   . Tuberculosis Maternal Grandfather   . Emphysema Maternal Grandfather   . Lung disease Brother   . Asthma Son   . Rectal cancer Neg Hx   . Colon cancer Neg Hx     SOCIAL  HISTORY Social History   Tobacco Use  . Smoking status: Former Smoker    Packs/day: 1.50    Years: 46.00    Pack years: 69.00    Types: Cigarettes    Quit date: 12/12/2011    Years since quitting: 8.4  . Smokeless tobacco: Never Used  Vaping Use  . Vaping Use: Never used  Substance Use Topics  . Alcohol use: Yes    Alcohol/week: 1.0 standard drink    Types: 1 Cans of beer per week    Comment: a couple of beer on a weekend  . Drug use: Yes    Types: Cocaine, Marijuana, "Crack" cocaine    Comment: november         OPHTHALMIC EXAM:  Base Eye Exam    Visual Acuity (Snellen - Linear)      Right Left   Dist cc HM 20/25 +2   Dist ph cc NI        Tonometry (Tonopen, 9:12 AM)      Right Left   Pressure 13 13       Pupils      Dark Light Shape React APD   Right 4 3 Round Minimal None   Left 4 3 Round Brisk None       Visual Fields (Counting fingers)      Left Right    Full    Restrictions  Total superior temporal, inferior temporal deficiencies; Partial inner superior nasal, inferior nasal deficiencies  Sees peripherally nasally OD       Extraocular Movement      Right Left    Full, Ortho Full, Ortho       Neuro/Psych    Oriented x3: Yes   Mood/Affect: Normal       Dilation    Both eyes: 1.0% Mydriacyl, 2.5% Phenylephrine @ 9:12 AM        Slit Lamp and Fundus Exam    Slit Lamp Exam      Right Left   Lids/Lashes Normal Normal   Conjunctiva/Sclera White and quiet White and quiet   Cornea trace Punctate epithelial erosions trace Punctate epithelial erosions   Anterior Chamber Deep and quiet Deep and quiet   Iris Round and dilated Round and dilated   Lens 1+ Nuclear sclerosis, 1+ Cortical cataract, focal cortical wedge at 0600 1+ Nuclear sclerosis, 1+ Cortical cataract   Vitreous Vitreous syneresis, +pigment, Posterior vitreous detachment, vitreous condensations Vitreous syneresis, Posterior vitreous detachment       Fundus Exam      Right Left    Disc Pink and Sharp Pink and Sharp   C/D Ratio 0.5 0.6   Macula Bullous temporal detachment with SRF almost to disc, +corregations  Flat, Blunted foveal reflex, mild ERM, No heme or edema   Vessels attenuated, Tortuous attenuated, mild tortuousity   Periphery Temporal detachment from at 0730-1100, HST at 1030, VR tuft at 0300 Attached, HST at 0130 no SRF, mild inferior paving stone degeneration        Refraction    Wearing Rx      Sphere Cylinder Axis Add   Right +1.00 +1.50 178 +2.00   Left +0.50 +1.25 166 +2.00          IMAGING AND PROCEDURES  Imaging and Procedures for 05/06/2020  OCT, Retina - OU - Both Eyes       Right Eye Quality was borderline. Progression has no prior data. Findings include subretinal fluid, intraretinal fluid, abnormal foveal contour (Limited rasters -- temporal SRF with IRF -- chronic appearing RD).   Left Eye Quality was good. Central Foveal Thickness: 283. Findings include normal foveal contour, no IRF, no SRF, epiretinal membrane.   Notes *Images captured and stored on drive  Diagnosis / Impression:  OD: Limited rasters -- temporal SRF with IRF -- chronic appearing RD OS: NFP, no IRF/SRF  Clinical management:  See below  Abbreviations: NFP - Normal foveal profile. CME - cystoid macular edema. PED - pigment epithelial detachment. IRF - intraretinal fluid. SRF - subretinal fluid. EZ - ellipsoid zone. ERM - epiretinal membrane. ORA - outer retinal atrophy. ORT - outer retinal tubulation. SRHM - subretinal hyper-reflective material. IRHM - intraretinal hyper-reflective material        Repair Retinal Breaks, Laser - OS - Left Eye       LASER PROCEDURE NOTE  Procedure:  Barrier laser retinopexy using slit lamp laser, left eye   Diagnosis:   Retinal tear, left eye                     Flap tear at 130 o'clock anterior to equator   Surgeon: Bernarda Caffey, MD, PhD  Anesthesia: Topical  Informed consent obtained, operative eye marked, and  time out performed prior to initiation of laser.   Laser settings:  Lumenis Smart532 laser, slit lamp Lens: [place] PRP 165 Power: 320 mW Spot size: 200 microns Duration: 40 msec  # spots: 578  Placement of laser: Using a [place]ster PRP 165 contact lens at the slit lamp, laser was placed in three+ confluent rows around flap tear at 130 oclock anterior to equator with additional rows anteriorly.  Complications: None.  Patient tolerated the procedure well and received written and verbal post-procedure care information/education.         Pneumatic Retinopexy - OD - Right Eye       PROCEDURE NOTE  Diagnosis:  Retinal detachment with retinal tear, RIGHT EYE  Procedure:  Pneumatic cryopexy with C3F8 gas injection, RIGHT EYE  CPT:  56213  Surgeon: Bernarda Caffey, M.D.,Ph.D.  Anesthesia:  Subconjunctival Lidocaine   The patient was brought to the procedure room. Informed consent was obtained for cryopexy of tear and intravitreal gas injection. The diagnosis was reviewed with the patient and all questions were answered. The risks of the procedure including potential systemic risks like stroke and heart attack and other thrombotic events as well as the local risks like infection, endophthalmitis, retinal detachment were discussed. The patient consented to the procedure.   The RIGHT eye was marked and a time out was performed identifying the correct eye. Subsequently, the patient was placed in the supine position. Topical anesthetic drops were given and a lid speculum was placed  to expose the eye. Subsequently, 2% Lidocaine was injected subconjunctivally in the quadrants of the tears giving adequate anesthesia. Using indirect ophthalmoscopy the cryo probe was positioned beneath the tear at 0130 and choroidal and retinal whitening was achieved 360 degrees around the tear margins. A small unrelated VR tuft was identified at 0300 and cryopexy was performed on that lesion. The superotemporal  quadrant was then cleaned with Betadine swabs and allowed to dry. At this time, 4 mm posterior to the limbus utilizing a 30-gauge needle, 0.45 cc of pure, 100% C3F8 gas was injected into the vitreous cavity under direct visualization. The needle was then withdrawn from the eye and the retina and gas bubble were visualized. An AC tap was performed to normalize the pressure and the central retinal artery was noted to be patent. Betadine was then reapplied to the injection sites and then rinsed with sterile BSS. A drop of polymixin ophthalmic soln and maxitrol opthalmic ung were applied to the eye, then the eye was double patched with eye pads. An arrow was drawn on the dressing to assist with post-operative positioning. There were no complications. Discharge and post-operative instructions were reviewed.                 ASSESSMENT/PLAN:    ICD-10-CM   1. Right retinal detachment  H33.21 Pneumatic Retinopexy - OD - Right Eye  2. Retinal edema  H35.81 OCT, Retina - OU - Both Eyes  3. Retinal tear of left eye  H33.312 Repair Retinal Breaks, Laser - OS - Left Eye  4. Essential hypertension  I10   5. Hypertensive retinopathy of both eyes  H35.033   6. Combined forms of age-related cataract of both eyes  H25.813     1,2. Rhegmatogenous retinal detachment, OD - bullous superior mac off detachment, onset of foveal involvement Thurday, 09.23.21 by pt history - detached temporally from 0730 to 11 oclock, fovea off, horse shoe tear at 1030 - The incidence, risk factors, and natural history of retinal detachment was discussed with patient.   - Potential treatment options including delimiting laser, pneumatic retinopexy, scleral buckle, and vitrectomy, cryotherapy and laser, and the use of air, gas, and oil discussed with patient. - The risks of blindness, loss of vision, infection, hemorrhage, cataract progression or lens displacement were discussed with patient. - recommend pneumatic retinopexy OD  today, 09.24.21 - pt wishes to proceed with procedure - RBA of procedure discussed, questions answered - informed consent obtained and signed - see procedure note - f/u tomorrow at 11 am for POV  3. Retinal tear, OS - The incidence, risk factors, and natural history of retinal tear was discussed with patient.   - Potential treatment options including laser retinopexy and cryotherapy discussed with patient. - horse shoe tear located at 0130 - recommend laser retinopexy OS today, 09.24.21 - pt wishes to proceed with laser - RBA of procedure discussed, questions answered - informed consent obtained and signed - see procedure note - start PF QID x7 days - f/u tomorrow  4,5. Hypertensive retinopathy OU - discussed importance of tight BP control - monitor  6. Mixed Cataract OU - The symptoms of cataract, surgical options, and treatments and risks were discussed with patient. - discussed diagnosis and progression - not yet visually significant - monitor for now    Ophthalmic Meds Ordered this visit:  Meds ordered this encounter  Medications  . prednisoLONE acetate (PRED FORTE) 1 % ophthalmic suspension    Sig: Place 1 drop into the  left eye 4 (four) times daily for 7 days.    Dispense:  10 mL    Refill:  0  . DISCONTD: neomycin-polymyxin b-dexamethasone (MAXITROL) 3.5-10000-0.1 OINT    Sig: Place 1 application into the left eye 4 (four) times daily.    Dispense:  3.5 g    Refill:  2  . neomycin-polymyxin b-dexamethasone (MAXITROL) 3.5-10000-0.1 OINT    Sig: Place 1 application into the right eye 4 (four) times daily.    Dispense:  3.5 g    Refill:  2  . prednisoLONE acetate (PRED FORTE) 1 % ophthalmic suspension    Sig: Place 1 drop into the left eye 4 (four) times daily for 7 days.    Dispense:  10 mL    Refill:  0       Return in about 1 day (around 05/07/2020) for POV.  There are no Patient Instructions on file for this visit.   Explained the diagnoses, plan, and  follow up with the patient and they expressed understanding.  Patient expressed understanding of the importance of proper follow up care.   Gardiner Sleeper, M.D., Ph.D. Diseases & Surgery of the Retina and Vitreous [facility]  I have reviewed the above documentation for accuracy and completeness, and I agree with the above. Gardiner Sleeper, M.D., Ph.D. 05/06/20 1:18 PM   Abbreviations: M myopia (nearsighted); A astigmatism; H hyperopia (farsighted); P presbyopia; Mrx spectacle prescription;  CTL contact lenses; OD right eye; OS left eye; OU both eyes  XT exotropia; ET esotropia; PEK punctate epithelial keratitis; PEE punctate epithelial erosions; DES dry eye syndrome; MGD meibomian gland dysfunction; ATs artificial tears; PFAT's preservative free artificial tears; [place] nuclear sclerotic cataract; PSC posterior subcapsular cataract; ERM epi-retinal membrane; PVD posterior vitreous detachment; RD retinal detachment; DM diabetes mellitus; DR diabetic retinopathy; NPDR non-proliferative diabetic retinopathy; PDR proliferative diabetic retinopathy; CSME clinically significant macular edema; DME diabetic macular edema; dbh dot blot hemorrhages; CWS cotton wool spot; POAG primary open angle glaucoma; C/D cup-to-disc ratio; HVF humphrey visual field; GVF goldmann visual field; OCT optical coherence tomography; IOP intraocular pressure; BRVO Branch retinal vein occlusion; CRVO central retinal vein occlusion; CRAO central retinal artery occlusion; BRAO branch retinal artery occlusion; RT retinal tear; SB scleral buckle; PPV pars plana vitrectomy; VH Vitreous hemorrhage; PRP panretinal laser photocoagulation; IVK intravitreal kenalog; VMT vitreomacular traction; MH Macular hole;  NVD neovascularization of the disc; NVE neovascularization elsewhere; AREDS age related eye disease study; ARMD age related macular degeneration; POAG primary open angle glaucoma; EBMD epithelial/anterior basement  membrane dystrophy; ACIOL anterior chamber intraocular lens; IOL intraocular lens; PCIOL posterior chamber intraocular lens; Phaco/IOL phacoemulsification with intraocular lens placement; [place] photorefractive keratectomy; LASIK laser assisted in situ keratomileusis; HTN hypertension; DM diabetes mellitus; COPD chronic obstructive pulmonary disease

## 2020-05-07 ENCOUNTER — Encounter (INDEPENDENT_AMBULATORY_CARE_PROVIDER_SITE_OTHER): Payer: Self-pay | Admitting: Ophthalmology

## 2020-05-07 ENCOUNTER — Ambulatory Visit (INDEPENDENT_AMBULATORY_CARE_PROVIDER_SITE_OTHER): Payer: PPO | Admitting: Ophthalmology

## 2020-05-07 ENCOUNTER — Encounter (INDEPENDENT_AMBULATORY_CARE_PROVIDER_SITE_OTHER): Payer: Self-pay

## 2020-05-07 DIAGNOSIS — I10 Essential (primary) hypertension: Secondary | ICD-10-CM

## 2020-05-07 DIAGNOSIS — H33312 Horseshoe tear of retina without detachment, left eye: Secondary | ICD-10-CM

## 2020-05-07 DIAGNOSIS — H3321 Serous retinal detachment, right eye: Secondary | ICD-10-CM

## 2020-05-07 DIAGNOSIS — H25813 Combined forms of age-related cataract, bilateral: Secondary | ICD-10-CM

## 2020-05-07 DIAGNOSIS — H3581 Retinal edema: Secondary | ICD-10-CM

## 2020-05-07 DIAGNOSIS — H35033 Hypertensive retinopathy, bilateral: Secondary | ICD-10-CM

## 2020-05-07 NOTE — Progress Notes (Signed)
[facility] Note  05/07/2020     CHIEF COMPLAINT Patient presents for Post-op Follow-up   HISTORY OF PRESENT ILLNESS: Todd Weeks is a 64 y.o. male who presents to the clinic today for:   HPI    Post-op Follow-up    In both eyes.  I, the attending physician,  performed the HPI with the patient and updated documentation appropriately.          Comments    POD1 s/p PR OD, laser retinopexy OS. No change in vision. Can see 3 bubbles. Reports good positioning since yesterday.       Last edited by Bernarda Caffey, MD on 05/07/2020 11:16 AM. (History)      Referring physician: Vivi Barrack, MD [place],  [place] [address]  HISTORICAL INFORMATION:   Selected notes from the MEDICAL RECORD NUMBER Referred by Dr. Truman Hayward:  Ocular Hx- PMH-    CURRENT MEDICATIONS: Current Outpatient Medications (Ophthalmic Drugs)  Medication Sig  . neomycin-polymyxin b-dexamethasone (MAXITROL) 3.5-10000-0.1 OINT Place 1 application into the right eye 4 (four) times daily.  . prednisoLONE acetate (PRED FORTE) 1 % ophthalmic suspension Place 1 drop into the left eye 4 (four) times daily for 7 days.  . prednisoLONE acetate (PRED FORTE) 1 % ophthalmic suspension Place 1 drop into the left eye 4 (four) times daily for 7 days.   No current facility-administered medications for this visit. (Ophthalmic Drugs)   Current Outpatient Medications (Other)  Medication Sig  . atorvastatin (LIPITOR) 40 MG tablet Take 40 mg by mouth daily.  . bacitracin 500 UNIT/GM ointment Apply 1 application topically 2 (two) times daily. (Patient not taking: Reported on 03/11/2020)  . clopidogrel (PLAVIX) 75 MG tablet TAKE (1) TABLET BY MOUTH ONCE DAILY. (Patient taking differently: Take 75 mg by mouth daily. TAKE (1) TABLET BY MOUTH ONCE DAILY.)  . famotidine (PEPCID) 20 MG tablet One daily (Patient taking differently: Take 20 mg by mouth daily. One daily)  . FLUoxetine (PROZAC) 20 MG  capsule Take 1 capsule (20 mg total) by mouth every morning.  . irbesartan (AVAPRO) 300 MG tablet Take 1 tablet (300 mg total) by mouth daily.  . Multiple Vitamins-Minerals (MULTIVITAMIN WITH MINERALS) tablet Take 1 tablet by mouth daily.  . simvastatin (ZOCOR) 40 MG tablet Take 40 mg by mouth daily. (Patient not taking: Reported on 04/28/2020)  . tamsulosin (FLOMAX) 0.4 MG CAPS capsule Take 1 capsule (0.4 mg total) by mouth daily.   No current facility-administered medications for this visit. (Other)      REVIEW OF SYSTEMS: ROS    Positive for: Eyes   Negative for: Constitutional, Gastrointestinal, Neurological, Skin, Genitourinary, Musculoskeletal, HENT, Endocrine, Cardiovascular, Respiratory, Psychiatric, Allergic/Imm, Heme/Lymph   Last edited by Bernarda Caffey, MD on 05/07/2020 10:59 AM. (History)       ALLERGIES No Known Allergies  PAST MEDICAL HISTORY Past Medical History:  Diagnosis Date  . Bell's palsy   . Closed nondisplaced fracture of neck of third metacarpal bone of right hand 02/04/2017  . Clotting disorder ([place])   . Depression   . Diverticulitis   . GERD (gastroesophageal reflux disease)   . Headache(784.0)   . HTN (hypertension)   . Hyperlipidemia   . Mild CAD 2012  . PVD (peripheral vascular disease) ([place])   . Sexual dysfunction    Past Surgical History:  Procedure Laterality Date  . ABDOMINAL AORTOGRAM W/LOWER EXTREMITY Bilateral 02/05/2020   Procedure: ABDOMINAL AORTOGRAM W/LOWER EXTREMITY;  Surgeon:  Elam Dutch, MD;  Location: [facility];  Service: Cardiovascular;  Laterality: Bilateral;  . PROSTATE BIOPSY  2015    FAMILY HISTORY Family History  Problem Relation Age of Onset  . Hypertension Mother   . Deep vein thrombosis Father   . Diabetes Brother   . Tuberculosis Maternal Grandfather   . Emphysema Maternal Grandfather   . Lung disease Brother   . Asthma Son   . Rectal cancer Neg Hx   . Colon cancer Neg Hx     SOCIAL  HISTORY Social History   Tobacco Use  . Smoking status: Former Smoker    Packs/day: 1.50    Years: 46.00    Pack years: 69.00    Types: Cigarettes    Quit date: 12/12/2011    Years since quitting: 8.4  . Smokeless tobacco: Never Used  Vaping Use  . Vaping Use: Never used  Substance Use Topics  . Alcohol use: Yes    Alcohol/week: 1.0 standard drink    Types: 1 Cans of beer per week    Comment: a couple of beer on a weekend  . Drug use: Yes    Types: Cocaine, Marijuana, "Crack" cocaine    Comment: november         OPHTHALMIC EXAM:  Base Eye Exam    Visual Acuity (Snellen - Linear)      Right Left   Dist [place] CF 1' 20/30 +2   Dist ph [place]  20/25 +2       Tonometry (Tonopen, 11:09 AM)      Right Left   Pressure 23 12       Pupils      Dark Light Shape React APD   Right 3 2 Round 1+ -   Left 3 2 Round 2+ -       Visual Fields (Counting fingers)      Left Right    Full    Restrictions  Total superior temporal deficiency; Partial outer inferior temporal deficiency       Extraocular Movement      Right Left    Full, Ortho Full, Ortho       Neuro/Psych    Oriented x3: Yes   Mood/Affect: Normal       Dilation    Both eyes: 1.0% Mydriacyl, 2.5% Phenylephrine @ 11:10 AM        Slit Lamp and Fundus Exam    External Exam      Right Left   External periorbital edema Normal       Slit Lamp Exam      Right Left   Lids/Lashes edema Normal   Conjunctiva/Sclera chemosis White and quiet   Cornea trace Punctate epithelial erosions trace Punctate epithelial erosions   Anterior Chamber Deep and quiet Deep and quiet   Iris Round and dilated Round and dilated   Lens 1+ Nuclear sclerosis, 1+ Cortical cataract, focal cortical wedge at 0600 1+ Nuclear sclerosis, 1+ Cortical cataract   Vitreous Vitreous syneresis, +pigment, Posterior vitreous detachment, vitreous condensations Vitreous syneresis, Posterior vitreous detachment       Fundus Exam      Right Left   Disc  Pink and Sharp Pink and Sharp   C/D Ratio 0.5 0.6   Macula Retina flattening; Interval improvement in bullous SRF; corregations improving temporally Flat, Blunted foveal reflex, mild ERM, No heme or edema   Vessels attenuated, Tortuous attenuated, mild tortuousity   Periphery Temporal detachment improving, some mild shifting of  SRF inferiorly to 0500, HST at 1030 w/ good early cryo changes surrounding, VR tuft at 0300 w/ good early cryo changes Attached, HST at 0130 no SRF--good early laser changes surrounding, mild inferior paving stone degeneration          IMAGING AND PROCEDURES  Imaging and Procedures for 05/07/2020           ASSESSMENT/PLAN:    ICD-10-CM   1. Right retinal detachment  H33.21   2. Retinal edema  H35.81   3. Retinal tear of left eye  H33.312   4. Essential hypertension  I10   5. Hypertensive retinopathy of both eyes  H35.033   6. Combined forms of age-related cataract of both eyes  H25.813     1,2. Rhegmatogenous retinal detachment, OD - bullous superior mac off detachment, onset of foveal involvement Thurday, 09.23.21 by pt history - detached temporally from 0730 to 11 oclock, fovea off, horse shoe tear at 1030 - s/p pneumatic retinopexy OD 09.24.21 - BCVA CF 1' - IOP 23 - temporal detachment improving, mild inferior shift of fluid to 0500 - continue Maxitrol ung QID OD - start Cosopt BID OD - f/u Monday at 815 am for POV  3. Retinal tear, OS - horse shoe tear located at 0130 - s/p laser retinopexy OS 09.24.21 - good early laser changes in place - cont PF QID x7 days - f/u Monday  4,5. Hypertensive retinopathy OU - discussed importance of tight BP control - monitor  6. Mixed Cataract OU - The symptoms of cataract, surgical options, and treatments and risks were discussed with patient. - discussed diagnosis and progression - not yet visually significant - monitor for now    Ophthalmic Meds Ordered this visit:  No orders of the defined  types were placed in this encounter.      Return in about 2 days (around 05/09/2020) for 815 am POV.  There are no Patient Instructions on file for this visit.   Explained the diagnoses, plan, and follow up with the patient and they expressed understanding.  Patient expressed understanding of the importance of proper follow up care.   Gardiner Sleeper, M.D., Ph.D. Diseases & Surgery of the Retina and Vitreous [facility]   Abbreviations: M myopia (nearsighted); A astigmatism; H hyperopia (farsighted); P presbyopia; Mrx spectacle prescription;  CTL contact lenses; OD right eye; OS left eye; OU both eyes  XT exotropia; ET esotropia; PEK punctate epithelial keratitis; PEE punctate epithelial erosions; DES dry eye syndrome; MGD meibomian gland dysfunction; ATs artificial tears; PFAT's preservative free artificial tears; [place] nuclear sclerotic cataract; PSC posterior subcapsular cataract; ERM epi-retinal membrane; PVD posterior vitreous detachment; RD retinal detachment; DM diabetes mellitus; DR diabetic retinopathy; NPDR non-proliferative diabetic retinopathy; PDR proliferative diabetic retinopathy; CSME clinically significant macular edema; DME diabetic macular edema; dbh dot blot hemorrhages; CWS cotton wool spot; POAG primary open angle glaucoma; C/D cup-to-disc ratio; HVF humphrey visual field; GVF goldmann visual field; OCT optical coherence tomography; IOP intraocular pressure; BRVO Branch retinal vein occlusion; CRVO central retinal vein occlusion; CRAO central retinal artery occlusion; BRAO branch retinal artery occlusion; RT retinal tear; SB scleral buckle; PPV pars plana vitrectomy; VH Vitreous hemorrhage; PRP panretinal laser photocoagulation; IVK intravitreal kenalog; VMT vitreomacular traction; MH Macular hole;  NVD neovascularization of the disc; NVE neovascularization elsewhere; AREDS age related eye disease study; ARMD age related macular degeneration; POAG primary  open angle glaucoma; EBMD epithelial/anterior basement membrane dystrophy; ACIOL anterior chamber intraocular lens; IOL intraocular  lens; PCIOL posterior chamber intraocular lens; Phaco/IOL phacoemulsification with intraocular lens placement; [place] photorefractive keratectomy; LASIK laser assisted in situ keratomileusis; HTN hypertension; DM diabetes mellitus; COPD chronic obstructive pulmonary disease

## 2020-05-09 ENCOUNTER — Other Ambulatory Visit: Payer: Self-pay

## 2020-05-09 ENCOUNTER — Encounter (INDEPENDENT_AMBULATORY_CARE_PROVIDER_SITE_OTHER): Payer: Self-pay | Admitting: Ophthalmology

## 2020-05-09 ENCOUNTER — Ambulatory Visit (INDEPENDENT_AMBULATORY_CARE_PROVIDER_SITE_OTHER): Payer: PPO | Admitting: Ophthalmology

## 2020-05-09 DIAGNOSIS — H35033 Hypertensive retinopathy, bilateral: Secondary | ICD-10-CM

## 2020-05-09 DIAGNOSIS — H3581 Retinal edema: Secondary | ICD-10-CM | POA: Diagnosis not present

## 2020-05-09 DIAGNOSIS — H33312 Horseshoe tear of retina without detachment, left eye: Secondary | ICD-10-CM

## 2020-05-09 DIAGNOSIS — H3321 Serous retinal detachment, right eye: Secondary | ICD-10-CM

## 2020-05-09 DIAGNOSIS — H25813 Combined forms of age-related cataract, bilateral: Secondary | ICD-10-CM

## 2020-05-09 DIAGNOSIS — I10 Essential (primary) hypertension: Secondary | ICD-10-CM

## 2020-05-09 NOTE — Progress Notes (Addendum)
[facility] Note  05/10/2020     CHIEF COMPLAINT Patient presents for Retina Follow Up   HISTORY OF PRESENT ILLNESS: Todd Weeks is a 64 y.o. male who presents to the clinic today for:  HPI    Retina Follow Up    Patient presents with  Retinal Break/Detachment.  In right eye.  This started 1 day ago.  I, the attending physician,  performed the HPI with the patient and updated documentation appropriately.          Comments    Patient here for 1 day retina follow up for RD OD. Patient states vision better than it was. Still not there yet. No eye pain. Using gtts.  Hasn't used this am.       Last edited by Bernarda Caffey, MD on 05/10/2020  9:15 AM. (History)    Pt states he is having a hard time sleeping bc of the position he is having to maintain, he states he sees a "black spot" in the gas bubble  Referring physician: Vivi Barrack, [place],  [place] [address]  HISTORICAL INFORMATION:   Selected notes from the [facility]: Current Outpatient Medications (Ophthalmic Drugs)  Medication Sig  . neomycin-polymyxin b-dexamethasone (MAXITROL) 3.5-10000-0.1 OINT Place 1 application into the right eye 4 (four) times daily.  . prednisoLONE acetate (PRED FORTE) 1 % ophthalmic suspension Place 1 drop into the left eye 4 (four) times daily for 7 days.  . prednisoLONE acetate (PRED FORTE) 1 % ophthalmic suspension Place 1 drop into the left eye 4 (four) times daily for 7 days.   No current facility-administered medications for this visit. (Ophthalmic Drugs)   Current Outpatient Medications (Other)  Medication Sig  . atorvastatin (LIPITOR) 40 MG tablet Take 40 mg by mouth daily.  . bacitracin 500 UNIT/GM ointment Apply 1 application topically 2 (two) times daily. (Patient not taking: Reported on 03/11/2020)  . clopidogrel (PLAVIX) 75 MG tablet TAKE (1) TABLET BY MOUTH ONCE DAILY. (Patient taking differently:  Take 75 mg by mouth daily. TAKE (1) TABLET BY MOUTH ONCE DAILY.)  . famotidine (PEPCID) 20 MG tablet One daily (Patient taking differently: Take 20 mg by mouth daily. One daily)  . FLUoxetine (PROZAC) 20 MG capsule Take 1 capsule (20 mg total) by mouth every morning.  . irbesartan (AVAPRO) 300 MG tablet Take 1 tablet (300 mg total) by mouth daily.  . Multiple Vitamins-Minerals (MULTIVITAMIN WITH MINERALS) tablet Take 1 tablet by mouth daily.  . simvastatin (ZOCOR) 40 MG tablet Take 40 mg by mouth daily. (Patient not taking: Reported on 04/28/2020)  . tamsulosin (FLOMAX) 0.4 MG CAPS capsule Take 1 capsule (0.4 mg total) by mouth daily.   No current facility-administered medications for this visit. (Other)      REVIEW OF SYSTEMS: ROS    Positive for: Eyes   Negative for: Constitutional, Gastrointestinal, Neurological, Skin, Genitourinary, Musculoskeletal, HENT, Endocrine, Cardiovascular, Respiratory, Psychiatric, Allergic/Imm, Heme/Lymph   Last edited by Theodore Demark, COA on 05/10/2020  8:13 AM. (History)       ALLERGIES No Known Allergies  PAST MEDICAL HISTORY Past Medical History:  Diagnosis Date  . Bell's palsy   . Closed nondisplaced fracture of neck of third metacarpal bone of right hand 02/04/2017  . Clotting disorder ([place])   . Depression   . Diverticulitis   . GERD (gastroesophageal reflux disease)   . Headache(784.0)   . HTN (hypertension)   .  Hyperlipidemia   . Mild CAD 2012  . PVD (peripheral vascular disease) ([place])   . Sexual dysfunction    Past Surgical History:  Procedure Laterality Date  . ABDOMINAL AORTOGRAM W/LOWER EXTREMITY Bilateral 02/05/2020   Procedure: ABDOMINAL AORTOGRAM W/LOWER EXTREMITY;  Surgeon: Elam Dutch, MD;  Location: [facility];  Service: Cardiovascular;  Laterality: Bilateral;  . PROSTATE BIOPSY  2015    FAMILY HISTORY Family History  Problem Relation Age of Onset  . Hypertension Mother   . Deep vein thrombosis Father    . Diabetes Brother   . Tuberculosis Maternal Grandfather   . Emphysema Maternal Grandfather   . Lung disease Brother   . Asthma Son   . Rectal cancer Neg Hx   . Colon cancer Neg Hx     SOCIAL HISTORY Social History   Tobacco Use  . Smoking status: Former Smoker    Packs/day: 1.50    Years: 46.00    Pack years: 69.00    Types: Cigarettes    Quit date: 12/12/2011    Years since quitting: 8.4  . Smokeless tobacco: Never Used  Vaping Use  . Vaping Use: Never used  Substance Use Topics  . Alcohol use: Yes    Alcohol/week: 1.0 standard drink    Types: 1 Cans of beer per week    Comment: a couple of beer on a weekend  . Drug use: Yes    Types: Cocaine, Marijuana, "Crack" cocaine    Comment: november         OPHTHALMIC EXAM:  Base Eye Exam    Visual Acuity (Snellen - Linear)      Right Left   Dist [place] 20/200 -2 20/25 -2   Dist ph [place] 20/80 +2 20/20       Tonometry (Tonopen, 8:09 AM)      Right Left   Pressure 12 14       Pupils      Dark Light Shape React APD   Right 3 2 Round Brisk None   Left 3 2 Round Brisk None       Visual Fields (Counting fingers)      Left Right    Full    Restrictions  Total superior temporal deficiency; Partial outer inferior temporal deficiency       Extraocular Movement      Right Left    Full Full       Neuro/Psych    Oriented x3: Yes   Mood/Affect: Normal       Dilation    Both eyes: 1.0% Mydriacyl, 2.5% Phenylephrine @ 8:09 AM        Slit Lamp and Fundus Exam    External Exam      Right Left   External periorbital edema Normal       Slit Lamp Exam      Right Left   Lids/Lashes Dermatochalasis - upper lid Normal   Conjunctiva/Sclera chemosis 360 White and quiet   Cornea trace Punctate epithelial erosions trace Punctate epithelial erosions   Anterior Chamber deep, narrow temporal angle Deep and quiet   Iris Round and dilated Round and dilated   Lens 1+ Nuclear sclerosis, 1+ Cortical cataract, focal cortical  wedge at 0600 1+ Nuclear sclerosis, 1+ Cortical cataract   Vitreous Vitreous syneresis, +pigment, Posterior vitreous detachment, vitreous condensations, 25-30% gas bubble with tiny satelitte nasally Vitreous syneresis, Posterior vitreous detachment       Fundus Exam      Right Left  Disc Pink and Sharp Pink and Sharp   C/D Ratio 0.5 0.6   Macula Flat, Blunted foveal reflex, trace residual SRF Flat, Blunted foveal reflex, mild ERM, No heme or edema   Vessels attenuated, Tortuous attenuated, mild tortuousity   Periphery Temporal detachment improving, some mild shifting of SRF inferiorly to 0500, HST at 1030 w/ good cryo changes surrounding, VR tuft at 0300 w/ good cryo changes Attached, HST at 0130 no SRF--good early laser changes surrounding, mild inferior paving stone degeneration        Refraction    Wearing Rx      Sphere Cylinder Axis Add   Right +1.00 +1.50 178 +2.00   Left +0.50 +1.25 166 +2.00          IMAGING AND PROCEDURES  Imaging and Procedures for 05/10/2020  OCT, Retina - OU - Both Eyes       Right Eye Quality was good. Central Foveal Thickness: 466. Progression has improved. Findings include subretinal fluid, abnormal foveal contour, epiretinal membrane, no IRF, macular pucker (Interval improvement in SRF, mild residual fluid greatest inferiorly -- significantly improved; central ERM w/ pucker).   Left Eye Quality was good. Central Foveal Thickness: 282. Progression has been stable. Findings include normal foveal contour, no IRF, no SRF, epiretinal membrane.   Notes *Images captured and stored on drive  Diagnosis / Impression:  OD: Interval improvement in SRF, mild residual fluid greatest inferiorly -- significantly improved; central ERM w/ pucker OS: NFP, no IRF/SRF  Clinical management:  See below  Abbreviations: NFP - Normal foveal profile. CME - cystoid macular edema. PED - pigment epithelial detachment. IRF - intraretinal fluid. SRF - subretinal fluid.  EZ - ellipsoid zone. ERM - epiretinal membrane. ORA - outer retinal atrophy. ORT - outer retinal tubulation. SRHM - subretinal hyper-reflective material. IRHM - intraretinal hyper-reflective material                 ASSESSMENT/PLAN:    ICD-10-CM   1. Right retinal detachment  H33.21   2. Retinal tear of left eye  H33.312   3. Retinal edema  H35.81 OCT, Retina - OU - Both Eyes  4. Essential hypertension  I10   5. Hypertensive retinopathy of both eyes  H35.033   6. Combined forms of age-related cataract of both eyes  H25.813     1,2. Rhegmatogenous retinal detachment, OD - bullous superior mac off detachment, onset of foveal involvement Thurday, 09.23.21 by pt history - detached temporally from 0730 to 11 oclock, fovea off, horse shoe tear at 1030 - s/p pneumatic retinopexy OD 09.24.21 - BCVA improved to 20/80+2 from 20/250+1 - IOP 12 - gas bubble at 25-30% with tiny satellite nasally - temporal detachment improving - OCT shows progressive improvement in SRF - continue Maxitrol ung QID OD - cont Cosopt BID OD - discussed importance of positioning -- recommend straight face-down positioning 15 min/hr; head tilt chin down positioning the remainder - f/u Wednesday at 745 am for POV  3. Retinal tear, OS - horse shoe tear located at 0130 - s/p laser retinopexy OS 09.24.21 - good early laser changes in place - cont PF QID x7 days  4,5. Hypertensive retinopathy OU - discussed importance of tight BP control - monitor  6. Mixed Cataract OU - The symptoms of cataract, surgical options, and treatments and risks were discussed with patient. - discussed diagnosis and progression - not yet visually significant - monitor for now    Ophthalmic Meds Ordered this visit:  No  orders of the defined types were placed in this encounter.      Return in about 1 day (around 05/11/2020) for f/u RD OD 7:45am, DFE, OCT.  There are no Patient Instructions on file for this  visit.   Explained the diagnoses, plan, and follow up with the patient and they expressed understanding.  Patient expressed understanding of the importance of proper follow up care.   This document serves as a record of services personally performed by Gardiner Sleeper, MD, PhD. It was created on their behalf by Estill Bakes, COT an ophthalmic technician. The creation of this record is the provider's dictation and/or activities during the visit.    Electronically signed by: Estill Bakes, COT 9.27.21 @ 9:24 AM   This document serves as a record of services personally performed by Gardiner Sleeper, MD, PhD. It was created on their behalf by San Jetty. Owens Shark, OA an ophthalmic technician. The creation of this record is the provider's dictation and/or activities during the visit.    Electronically signed by: San Jetty. Owens Shark, [place] 09.28.2021 9:24 AM  Gardiner Sleeper, M.D., Ph.D. Diseases & Surgery of the Retina and [place] 05/10/2020   I have reviewed the above documentation for accuracy and completeness, and I agree with the above. Gardiner Sleeper, M.D., Ph.D. 05/10/20 9:24 AM   Abbreviations: M myopia (nearsighted); A astigmatism; H hyperopia (farsighted); P presbyopia; Mrx spectacle prescription;  CTL contact lenses; OD right eye; OS left eye; OU both eyes  XT exotropia; ET esotropia; PEK punctate epithelial keratitis; PEE punctate epithelial erosions; DES dry eye syndrome; MGD meibomian gland dysfunction; ATs artificial tears; PFAT's preservative free artificial tears; [place] nuclear sclerotic cataract; PSC posterior subcapsular cataract; ERM epi-retinal membrane; PVD posterior vitreous detachment; RD retinal detachment; DM diabetes mellitus; DR diabetic retinopathy; NPDR non-proliferative diabetic retinopathy; PDR proliferative diabetic retinopathy; CSME clinically significant macular edema; DME diabetic macular edema; dbh dot blot hemorrhages; CWS cotton wool spot;  POAG primary open angle glaucoma; C/D cup-to-disc ratio; HVF humphrey visual field; GVF goldmann visual field; OCT optical coherence tomography; IOP intraocular pressure; BRVO Branch retinal vein occlusion; CRVO central retinal vein occlusion; CRAO central retinal artery occlusion; BRAO branch retinal artery occlusion; RT retinal tear; SB scleral buckle; PPV pars plana vitrectomy; VH Vitreous hemorrhage; PRP panretinal laser photocoagulation; IVK intravitreal kenalog; VMT vitreomacular traction; MH Macular hole;  NVD neovascularization of the disc; NVE neovascularization elsewhere; AREDS age related eye disease study; ARMD age related macular degeneration; POAG primary open angle glaucoma; EBMD epithelial/anterior basement membrane dystrophy; ACIOL anterior chamber intraocular lens; IOL intraocular lens; PCIOL posterior chamber intraocular lens; Phaco/IOL phacoemulsification with intraocular lens placement; [place] photorefractive keratectomy; LASIK laser assisted in situ keratomileusis; HTN hypertension; DM diabetes mellitus; COPD chronic obstructive pulmonary disease

## 2020-05-09 NOTE — Progress Notes (Signed)
[facility] Note  05/09/2020     CHIEF COMPLAINT Patient presents for Post-op Follow-up   HISTORY OF PRESENT ILLNESS: Todd Weeks is a 64 y.o. male who presents to the clinic today for:   HPI    Post-op Follow-up    In both eyes.  Vision is improved.  I, the attending physician,  performed the HPI with the patient and updated documentation appropriately.          Comments    Pt states vision is improved OD since last visit.  Patient denies eye pain.  Patient states he has less floaters than he started with prior.       Last edited by Bernarda Caffey, MD on 05/09/2020  8:51 AM. (History)    pt states the weekend was "okay", he states he is keeping his head down as much as possible, but he does find himself laying on his back occasionally, he is using Maxitrol Ung QID and Cosopt BID  Referring physician: Vivi Barrack, MD [place],  [place] [address]  HISTORICAL INFORMATION:   Selected notes from the MEDICAL RECORD NUMBER Referred by Dr. Truman Hayward:  Ocular Hx- PMH-    CURRENT MEDICATIONS: Current Outpatient Medications (Ophthalmic Drugs)  Medication Sig  . neomycin-polymyxin b-dexamethasone (MAXITROL) 3.5-10000-0.1 OINT Place 1 application into the right eye 4 (four) times daily.  . prednisoLONE acetate (PRED FORTE) 1 % ophthalmic suspension Place 1 drop into the left eye 4 (four) times daily for 7 days.  . prednisoLONE acetate (PRED FORTE) 1 % ophthalmic suspension Place 1 drop into the left eye 4 (four) times daily for 7 days.   No current facility-administered medications for this visit. (Ophthalmic Drugs)   Current Outpatient Medications (Other)  Medication Sig  . atorvastatin (LIPITOR) 40 MG tablet Take 40 mg by mouth daily.  . bacitracin 500 UNIT/GM ointment Apply 1 application topically 2 (two) times daily. (Patient not taking: Reported on 03/11/2020)  . clopidogrel (PLAVIX) 75 MG tablet TAKE (1) TABLET BY MOUTH ONCE DAILY.  (Patient taking differently: Take 75 mg by mouth daily. TAKE (1) TABLET BY MOUTH ONCE DAILY.)  . famotidine (PEPCID) 20 MG tablet One daily (Patient taking differently: Take 20 mg by mouth daily. One daily)  . FLUoxetine (PROZAC) 20 MG capsule Take 1 capsule (20 mg total) by mouth every morning.  . irbesartan (AVAPRO) 300 MG tablet Take 1 tablet (300 mg total) by mouth daily.  . Multiple Vitamins-Minerals (MULTIVITAMIN WITH MINERALS) tablet Take 1 tablet by mouth daily.  . simvastatin (ZOCOR) 40 MG tablet Take 40 mg by mouth daily. (Patient not taking: Reported on 04/28/2020)  . tamsulosin (FLOMAX) 0.4 MG CAPS capsule Take 1 capsule (0.4 mg total) by mouth daily.   No current facility-administered medications for this visit. (Other)      REVIEW OF SYSTEMS: ROS    Positive for: Eyes   Negative for: Constitutional, Gastrointestinal, Neurological, Skin, Genitourinary, Musculoskeletal, HENT, Endocrine, Cardiovascular, Respiratory, Psychiatric, Allergic/Imm, Heme/Lymph   Last edited by Doneen Poisson on 05/09/2020  8:16 AM. (History)       ALLERGIES No Known Allergies  PAST MEDICAL HISTORY Past Medical History:  Diagnosis Date  . Bell's palsy   . Closed nondisplaced fracture of neck of third metacarpal bone of right hand 02/04/2017  . Clotting disorder ([place])   . Depression   . Diverticulitis   . GERD (gastroesophageal reflux disease)   . Headache(784.0)   . HTN (hypertension)   .  Hyperlipidemia   . Mild CAD 2012  . PVD (peripheral vascular disease) ([place])   . Sexual dysfunction    Past Surgical History:  Procedure Laterality Date  . ABDOMINAL AORTOGRAM W/LOWER EXTREMITY Bilateral 02/05/2020   Procedure: ABDOMINAL AORTOGRAM W/LOWER EXTREMITY;  Surgeon: Elam Dutch, MD;  Location: [facility];  Service: Cardiovascular;  Laterality: Bilateral;  . PROSTATE BIOPSY  2015    FAMILY HISTORY Family History  Problem Relation Age of Onset  . Hypertension Mother   . Deep  vein thrombosis Father   . Diabetes Brother   . Tuberculosis Maternal Grandfather   . Emphysema Maternal Grandfather   . Lung disease Brother   . Asthma Son   . Rectal cancer Neg Hx   . Colon cancer Neg Hx     SOCIAL HISTORY Social History   Tobacco Use  . Smoking status: Former Smoker    Packs/day: 1.50    Years: 46.00    Pack years: 69.00    Types: Cigarettes    Quit date: 12/12/2011    Years since quitting: 8.4  . Smokeless tobacco: Never Used  Vaping Use  . Vaping Use: Never used  Substance Use Topics  . Alcohol use: Yes    Alcohol/week: 1.0 standard drink    Types: 1 Cans of beer per week    Comment: a couple of beer on a weekend  . Drug use: Yes    Types: Cocaine, Marijuana, "Crack" cocaine    Comment: november         OPHTHALMIC EXAM:  Base Eye Exam    Visual Acuity (Snellen - Linear)      Right Left   Dist [place] 20/250 +1 20/25 -2   Dist ph [place] NI NI       Tonometry (Tonopen, 8:21 AM)      Right Left   Pressure 16 14       Pupils      Dark Light Shape React APD   Right 3 2 Round Brisk 0   Left 3 2 Round Brisk 0       Extraocular Movement      Right Left    Full Full       Neuro/Psych    Oriented x3: Yes   Mood/Affect: Normal       Dilation    Both eyes: 1.0% Mydriacyl, 2.5% Phenylephrine @ 8:21 AM        Slit Lamp and Fundus Exam    External Exam      Right Left   External periorbital edema Normal       Slit Lamp Exam      Right Left   Lids/Lashes edema - improving Normal   Conjunctiva/Sclera chemosis 360 White and quiet   Cornea trace Punctate epithelial erosions trace Punctate epithelial erosions   Anterior Chamber deep, narrow temporal angle Deep and quiet   Iris Round and dilated Round and dilated   Lens 1+ Nuclear sclerosis, 1+ Cortical cataract, focal cortical wedge at 0600 1+ Nuclear sclerosis, 1+ Cortical cataract   Vitreous Vitreous syneresis, +pigment, Posterior vitreous detachment, vitreous condensations, 30% gas  bubble with one satellite Vitreous syneresis, Posterior vitreous detachment       Fundus Exam      Right Left   Disc Pink and Sharp Pink and Sharp   C/D Ratio 0.5 0.6   Macula Flat, Blunted foveal reflex, trace residual SRF Flat, Blunted foveal reflex, mild ERM, No heme or edema   Vessels  attenuated, Tortuous attenuated, mild tortuousity   Periphery Temporal detachment improving, some mild shifting of SRF inferiorly to 0500, HST at 1030 w/ good cryo changes surrounding, VR tuft at 0300 w/ good cryo changes Attached, HST at 0130 no SRF--good early laser changes surrounding, mild inferior paving stone degeneration        Refraction    Wearing Rx      Sphere Cylinder Axis Add   Right +1.00 +1.50 178 +2.00   Left +0.50 +1.25 166 +2.00          IMAGING AND PROCEDURES  Imaging and Procedures for 05/09/2020  OCT, Retina - OU - Both Eyes       Right Eye Quality was borderline. Central Foveal Thickness: 492. Progression has improved. Findings include subretinal fluid, abnormal foveal contour, epiretinal membrane, no IRF, macular pucker (Interval improvement in bullous SRF, mild residual fluid greatest inferiorly).   Left Eye Quality was good. Central Foveal Thickness: 288. Findings include normal foveal contour, no IRF, no SRF, epiretinal membrane.   Notes *Images captured and stored on drive  Diagnosis / Impression:  OD: Interval improvement in bullous SRF, mild residual fluid greatest inferiorly OS: NFP, no IRF/SRF  Clinical management:  See below  Abbreviations: NFP - Normal foveal profile. CME - cystoid macular edema. PED - pigment epithelial detachment. IRF - intraretinal fluid. SRF - subretinal fluid. EZ - ellipsoid zone. ERM - epiretinal membrane. ORA - outer retinal atrophy. ORT - outer retinal tubulation. SRHM - subretinal hyper-reflective material. IRHM - intraretinal hyper-reflective material                 ASSESSMENT/PLAN:    ICD-10-CM   1. Right retinal  detachment  H33.21   2. Retinal edema  H35.81 OCT, Retina - OU - Both Eyes  3. Retinal tear of left eye  H33.312   4. Essential hypertension  I10   5. Hypertensive retinopathy of both eyes  H35.033   6. Combined forms of age-related cataract of both eyes  H25.813     1,2. Rhegmatogenous retinal detachment, OD - bullous superior mac off detachment, onset of foveal involvement Thurday, 09.23.21 by pt history - detached temporally from 0730 to 11 oclock, fovea off, horse shoe tear at 1030 - s/p pneumatic retinopexy OD 09.24.21 - BCVA improved to 20/250+1 from CF 1' - IOP 16 - gas bubble at 30% with one satellite  - temporal detachment improving, mild inferior shift of fluid to 0500 - continue Maxitrol ung QID OD - start Cosopt BID OD - f/u Tuesday at 815 am for POV  3. Retinal tear, OS - horse shoe tear located at 0130 - s/p laser retinopexy OS 09.24.21 - good early laser changes in place - cont PF QID x7 days - f/u Monday  4,5. Hypertensive retinopathy OU - discussed importance of tight BP control - monitor  6. Mixed Cataract OU - The symptoms of cataract, surgical options, and treatments and risks were discussed with patient. - discussed diagnosis and progression - not yet visually significant - monitor for now    Ophthalmic Meds Ordered this visit:  No orders of the defined types were placed in this encounter.      Return in about 1 day (around 05/10/2020) for f/u RD OD, DFE, OCT.  There are no Patient Instructions on file for this visit.   Explained the diagnoses, plan, and follow up with the patient and they expressed understanding.  Patient expressed understanding of the importance of proper follow up care.  This document serves as a record of services personally performed by Gardiner Sleeper, MD, PhD. It was created on their behalf by San Jetty. Owens Shark, OA an ophthalmic technician. The creation of this record is the provider's dictation and/or activities during  the visit.    Electronically signed by: San Jetty. Owens Shark, [place] 09.27.2021 8:53 AM   Gardiner Sleeper, M.D., Ph.D. Diseases & Surgery of the Retina and Vitreous [facility]  I have reviewed the above documentation for accuracy and completeness, and I agree with the above. Gardiner Sleeper, M.D., Ph.D. 05/09/20 8:53 AM   Abbreviations: M myopia (nearsighted); A astigmatism; H hyperopia (farsighted); P presbyopia; Mrx spectacle prescription;  CTL contact lenses; OD right eye; OS left eye; OU both eyes  XT exotropia; ET esotropia; PEK punctate epithelial keratitis; PEE punctate epithelial erosions; DES dry eye syndrome; MGD meibomian gland dysfunction; ATs artificial tears; PFAT's preservative free artificial tears; [place] nuclear sclerotic cataract; PSC posterior subcapsular cataract; ERM epi-retinal membrane; PVD posterior vitreous detachment; RD retinal detachment; DM diabetes mellitus; DR diabetic retinopathy; NPDR non-proliferative diabetic retinopathy; PDR proliferative diabetic retinopathy; CSME clinically significant macular edema; DME diabetic macular edema; dbh dot blot hemorrhages; CWS cotton wool spot; POAG primary open angle glaucoma; C/D cup-to-disc ratio; HVF humphrey visual field; GVF goldmann visual field; OCT optical coherence tomography; IOP intraocular pressure; BRVO Branch retinal vein occlusion; CRVO central retinal vein occlusion; CRAO central retinal artery occlusion; BRAO branch retinal artery occlusion; RT retinal tear; SB scleral buckle; PPV pars plana vitrectomy; VH Vitreous hemorrhage; PRP panretinal laser photocoagulation; IVK intravitreal kenalog; VMT vitreomacular traction; MH Macular hole;  NVD neovascularization of the disc; NVE neovascularization elsewhere; AREDS age related eye disease study; ARMD age related macular degeneration; POAG primary open angle glaucoma; EBMD epithelial/anterior basement membrane dystrophy; ACIOL anterior chamber intraocular lens;  IOL intraocular lens; PCIOL posterior chamber intraocular lens; Phaco/IOL phacoemulsification with intraocular lens placement; [place] photorefractive keratectomy; LASIK laser assisted in situ keratomileusis; HTN hypertension; DM diabetes mellitus; COPD chronic obstructive pulmonary disease

## 2020-05-10 ENCOUNTER — Ambulatory Visit (INDEPENDENT_AMBULATORY_CARE_PROVIDER_SITE_OTHER): Payer: PPO | Admitting: Ophthalmology

## 2020-05-10 ENCOUNTER — Encounter (INDEPENDENT_AMBULATORY_CARE_PROVIDER_SITE_OTHER): Payer: Self-pay | Admitting: Ophthalmology

## 2020-05-10 DIAGNOSIS — H3581 Retinal edema: Secondary | ICD-10-CM

## 2020-05-10 DIAGNOSIS — H35033 Hypertensive retinopathy, bilateral: Secondary | ICD-10-CM

## 2020-05-10 DIAGNOSIS — I10 Essential (primary) hypertension: Secondary | ICD-10-CM

## 2020-05-10 DIAGNOSIS — H33312 Horseshoe tear of retina without detachment, left eye: Secondary | ICD-10-CM

## 2020-05-10 DIAGNOSIS — H25813 Combined forms of age-related cataract, bilateral: Secondary | ICD-10-CM

## 2020-05-10 DIAGNOSIS — H3321 Serous retinal detachment, right eye: Secondary | ICD-10-CM

## 2020-05-10 NOTE — Progress Notes (Signed)
[facility] Note  05/11/2020     CHIEF COMPLAINT Patient presents for Post-op Follow-up   HISTORY OF PRESENT ILLNESS: Todd Weeks is a 64 y.o. male who presents to the clinic today for:  HPI    Post-op Follow-up    In right eye.  Vision is stable.  I, the attending physician,  performed the HPI with the patient and updated documentation appropriately.          Comments    Pt states vision is about the same OU.  Pt denies eye pain or discomfort and denies any new or worsening floaters or fol OU.       Last edited by Bernarda Caffey, MD on 05/11/2020  8:22 AM. (History)    Pt states he did the 15 mins per hour of face down time that was discussed at last appt, he states the rest of the time he was in the head tilt chin down position  Referring physician: Vivi Barrack, MD [place],  [place] [address]  HISTORICAL INFORMATION:   Selected notes from the [facility]: Current Outpatient Medications (Ophthalmic Drugs)  Medication Sig  . neomycin-polymyxin b-dexamethasone (MAXITROL) 3.5-10000-0.1 OINT Place 1 application into the right eye 4 (four) times daily.  . prednisoLONE acetate (PRED FORTE) 1 % ophthalmic suspension Place 1 drop into the left eye 4 (four) times daily for 7 days.  . prednisoLONE acetate (PRED FORTE) 1 % ophthalmic suspension Place 1 drop into the left eye 4 (four) times daily for 7 days.   No current facility-administered medications for this visit. (Ophthalmic Drugs)   Current Outpatient Medications (Other)  Medication Sig  . atorvastatin (LIPITOR) 40 MG tablet Take 40 mg by mouth daily.  . bacitracin 500 UNIT/GM ointment Apply 1 application topically 2 (two) times daily. (Patient not taking: Reported on 03/11/2020)  . clopidogrel (PLAVIX) 75 MG tablet TAKE (1) TABLET BY MOUTH ONCE DAILY. (Patient taking differently: Take 75 mg by mouth daily. TAKE (1) TABLET BY MOUTH ONCE DAILY.)  .  famotidine (PEPCID) 20 MG tablet One daily (Patient taking differently: Take 20 mg by mouth daily. One daily)  . FLUoxetine (PROZAC) 20 MG capsule Take 1 capsule (20 mg total) by mouth every morning.  . irbesartan (AVAPRO) 300 MG tablet Take 1 tablet (300 mg total) by mouth daily.  . Multiple Vitamins-Minerals (MULTIVITAMIN WITH MINERALS) tablet Take 1 tablet by mouth daily.  . simvastatin (ZOCOR) 40 MG tablet Take 40 mg by mouth daily. (Patient not taking: Reported on 04/28/2020)  . tamsulosin (FLOMAX) 0.4 MG CAPS capsule Take 1 capsule (0.4 mg total) by mouth daily.   No current facility-administered medications for this visit. (Other)      REVIEW OF SYSTEMS: ROS    Positive for: Eyes   Negative for: Constitutional, Gastrointestinal, Neurological, Skin, Genitourinary, Musculoskeletal, HENT, Endocrine, Cardiovascular, Respiratory, Psychiatric, Allergic/Imm, Heme/Lymph   Last edited by Doneen Poisson on 05/11/2020  7:42 AM. (History)       ALLERGIES No Known Allergies  PAST MEDICAL HISTORY Past Medical History:  Diagnosis Date  . Bell's palsy   . Closed nondisplaced fracture of neck of third metacarpal bone of right hand 02/04/2017  . Clotting disorder ([place])   . Depression   . Diverticulitis   . GERD (gastroesophageal reflux disease)   . Headache(784.0)   . HTN (hypertension)   . Hyperlipidemia   . Mild CAD 2012  . PVD (  peripheral vascular disease) ([place])   . Sexual dysfunction    Past Surgical History:  Procedure Laterality Date  . ABDOMINAL AORTOGRAM W/LOWER EXTREMITY Bilateral 02/05/2020   Procedure: ABDOMINAL AORTOGRAM W/LOWER EXTREMITY;  Surgeon: Elam Dutch, MD;  Location: [facility];  Service: Cardiovascular;  Laterality: Bilateral;  . PROSTATE BIOPSY  2015    FAMILY HISTORY Family History  Problem Relation Age of Onset  . Hypertension Mother   . Deep vein thrombosis Father   . Diabetes Brother   . Tuberculosis Maternal Grandfather   .  Emphysema Maternal Grandfather   . Lung disease Brother   . Asthma Son   . Rectal cancer Neg Hx   . Colon cancer Neg Hx     SOCIAL HISTORY Social History   Tobacco Use  . Smoking status: Former Smoker    Packs/day: 1.50    Years: 46.00    Pack years: 69.00    Types: Cigarettes    Quit date: 12/12/2011    Years since quitting: 8.4  . Smokeless tobacco: Never Used  Vaping Use  . Vaping Use: Never used  Substance Use Topics  . Alcohol use: Yes    Alcohol/week: 1.0 standard drink    Types: 1 Cans of beer per week    Comment: a couple of beer on a weekend  . Drug use: Yes    Types: Cocaine, Marijuana, "Crack" cocaine    Comment: november         OPHTHALMIC EXAM:  Base Eye Exam    Visual Acuity (Snellen - Linear)      Right Left   Dist [place] 20/100 -2 20/30 -1   Dist ph [place] NI 20/25 -2       Tonometry (Tonopen, 7:46 AM)      Right Left   Pressure 12 15       Pupils      Dark Light Shape React APD   Right 3 2 Round Minimal 0   Left 3 2 Round Minimal 0       Visual Fields      Left Right    Full Full       Extraocular Movement      Right Left    Full Full       Neuro/Psych    Oriented x3: Yes   Mood/Affect: Normal       Dilation    Both eyes: 1.0% Mydriacyl, 2.5% Phenylephrine @ 7:46 AM        Slit Lamp and Fundus Exam    External Exam      Right Left   External periorbital edema Normal       Slit Lamp Exam      Right Left   Lids/Lashes Dermatochalasis - upper lid Normal   Conjunctiva/Sclera chemosis 360 White and quiet   Cornea trace Punctate epithelial erosions trace Punctate epithelial erosions   Anterior Chamber deep, narrow temporal angle Deep and quiet   Iris Round and moderately dilated to 5.39m Round and dilated   Lens 1+ Nuclear sclerosis, 1+ Cortical cataract, focal cortical wedge at 0600 1+ Nuclear sclerosis, 1+ Cortical cataract   Vitreous Vitreous syneresis, +pigment, Posterior vitreous detachment, vitreous condensations, 30-35%  gas bubble with 2 small satelitte nasally Vitreous syneresis, Posterior vitreous detachment       Fundus Exam      Right Left   Disc Pink and Sharp Pink and Sharp   C/D Ratio 0.5 0.6   Macula Flat, Blunted foveal  reflex, trace residual SRF Flat, Blunted foveal reflex, mild ERM, No heme or edema   Vessels attenuated, Tortuous attenuated, mild tortuousity   Periphery Temporal detachment improving, some mild shifting of SRF inferiorly -- mild increase from yesterday, HST at 1030 w/ good cryo changes surrounding, VR tuft at 0300 w/ good cryo changes Attached, HST at 0130 no SRF--good laser changes surrounding, mild inferior paving stone degeneration        Refraction    Wearing Rx      Sphere Cylinder Axis Add   Right +1.00 +1.50 178 +2.00   Left +0.50 +1.25 166 +2.00          IMAGING AND PROCEDURES  Imaging and Procedures for 05/11/2020  OCT, Retina - OU - Both Eyes       Right Eye Quality was good. Central Foveal Thickness: 477. Progression has worsened. Findings include subretinal fluid, abnormal foveal contour, epiretinal membrane, no IRF, macular pucker (Interval increase in SR inferiorly; central ERM w/ pucker).   Left Eye Quality was good. Central Foveal Thickness: 285. Progression has been stable. Findings include normal foveal contour, no IRF, no SRF, epiretinal membrane.   Notes *Images captured and stored on drive  Diagnosis / Impression:  OD: Interval increase in SRF inferiorly; central ERM w/ pucker OS: NFP, no IRF/SRF  Clinical management:  See below  Abbreviations: NFP - Normal foveal profile. CME - cystoid macular edema. PED - pigment epithelial detachment. IRF - intraretinal fluid. SRF - subretinal fluid. EZ - ellipsoid zone. ERM - epiretinal membrane. ORA - outer retinal atrophy. ORT - outer retinal tubulation. SRHM - subretinal hyper-reflective material. IRHM - intraretinal hyper-reflective material                 ASSESSMENT/PLAN:    ICD-10-CM    1. Right retinal detachment  H33.21   2. Retinal tear of left eye  H33.312   3. Retinal edema  H35.81 OCT, Retina - OU - Both Eyes  4. Essential hypertension  I10   5. Hypertensive retinopathy of both eyes  H35.033   6. Combined forms of age-related cataract of both eyes  H25.813     1,2. Rhegmatogenous retinal detachment, OD - bullous superior mac off detachment, onset of foveal involvement Thurday, 09.23.21 by pt history - detached temporally from 0730 to 11 oclock, fovea off, horse shoe tear at 1030 - s/p pneumatic retinopexy OD 09.24.21 - BCVA 20/100 - IOP 12 - gas bubble at 30-35% with 2 small satellites nasally - temporal detachment improving - OCT shows mild interval increase in inferior SRF-- still shallow - continue Maxitrol ung QID OD - cont Cosopt BID OD - start PF QID OD - discussed importance of positioning -- head tilt chin down positioning  - f/u Monday at 815 am for POV  3. Retinal tear, OS - horse shoe tear located at 0130 - s/p laser retinopexy OS 09.24.21 -- good laser changes surrounding - good early laser changes in place - cont PF QID x7 days  4,5. Hypertensive retinopathy OU - discussed importance of tight BP control - monitor  6. Mixed Cataract OU - The symptoms of cataract, surgical options, and treatments and risks were discussed with patient. - discussed diagnosis and progression - not yet visually significant - monitor for now    Ophthalmic Meds Ordered this visit:  Meds ordered this encounter  Medications  . neomycin-polymyxin b-dexamethasone (MAXITROL) 3.5-10000-0.1 OINT    Sig: Place 1 application into the right eye 4 (four) times daily.  Dispense:  3.5 g    Refill:  2       Return in about 5 days (around 05/16/2020) for f/u RD OD, DFE, OCT.  There are no Patient Instructions on file for this visit.   Explained the diagnoses, plan, and follow up with the patient and they expressed understanding.  Patient expressed  understanding of the importance of proper follow up care.   This document serves as a record of services personally performed by Gardiner Sleeper, MD, PhD. It was created on their behalf by San Jetty. Owens Shark, OA an ophthalmic technician. The creation of this record is the provider's dictation and/or activities during the visit.    Electronically signed by: San Jetty. Owens Shark, [place] 09.28.2021 8:24 AM  Gardiner Sleeper, M.D., Ph.D. Diseases & Surgery of the Retina and Vitreous [facility]  I have reviewed the above documentation for accuracy and completeness, and I agree with the above. Gardiner Sleeper, M.D., Ph.D. 05/11/20 8:26 AM   Abbreviations: M myopia (nearsighted); A astigmatism; H hyperopia (farsighted); P presbyopia; Mrx spectacle prescription;  CTL contact lenses; OD right eye; OS left eye; OU both eyes  XT exotropia; ET esotropia; PEK punctate epithelial keratitis; PEE punctate epithelial erosions; DES dry eye syndrome; MGD meibomian gland dysfunction; ATs artificial tears; PFAT's preservative free artificial tears; [place] nuclear sclerotic cataract; PSC posterior subcapsular cataract; ERM epi-retinal membrane; PVD posterior vitreous detachment; RD retinal detachment; DM diabetes mellitus; DR diabetic retinopathy; NPDR non-proliferative diabetic retinopathy; PDR proliferative diabetic retinopathy; CSME clinically significant macular edema; DME diabetic macular edema; dbh dot blot hemorrhages; CWS cotton wool spot; POAG primary open angle glaucoma; C/D cup-to-disc ratio; HVF humphrey visual field; GVF goldmann visual field; OCT optical coherence tomography; IOP intraocular pressure; BRVO Branch retinal vein occlusion; CRVO central retinal vein occlusion; CRAO central retinal artery occlusion; BRAO branch retinal artery occlusion; RT retinal tear; SB scleral buckle; PPV pars plana vitrectomy; VH Vitreous hemorrhage; PRP panretinal laser photocoagulation; IVK intravitreal kenalog; VMT  vitreomacular traction; MH Macular hole;  NVD neovascularization of the disc; NVE neovascularization elsewhere; AREDS age related eye disease study; ARMD age related macular degeneration; POAG primary open angle glaucoma; EBMD epithelial/anterior basement membrane dystrophy; ACIOL anterior chamber intraocular lens; IOL intraocular lens; PCIOL posterior chamber intraocular lens; Phaco/IOL phacoemulsification with intraocular lens placement; [place] photorefractive keratectomy; LASIK laser assisted in situ keratomileusis; HTN hypertension; DM diabetes mellitus; COPD chronic obstructive pulmonary disease

## 2020-05-11 ENCOUNTER — Encounter (INDEPENDENT_AMBULATORY_CARE_PROVIDER_SITE_OTHER): Payer: Self-pay | Admitting: Ophthalmology

## 2020-05-11 ENCOUNTER — Ambulatory Visit (INDEPENDENT_AMBULATORY_CARE_PROVIDER_SITE_OTHER): Payer: PPO | Admitting: Ophthalmology

## 2020-05-11 ENCOUNTER — Other Ambulatory Visit: Payer: Self-pay

## 2020-05-11 DIAGNOSIS — H35033 Hypertensive retinopathy, bilateral: Secondary | ICD-10-CM

## 2020-05-11 DIAGNOSIS — H3321 Serous retinal detachment, right eye: Secondary | ICD-10-CM

## 2020-05-11 DIAGNOSIS — I10 Essential (primary) hypertension: Secondary | ICD-10-CM

## 2020-05-11 DIAGNOSIS — H3581 Retinal edema: Secondary | ICD-10-CM

## 2020-05-11 DIAGNOSIS — H33312 Horseshoe tear of retina without detachment, left eye: Secondary | ICD-10-CM

## 2020-05-11 DIAGNOSIS — H25813 Combined forms of age-related cataract, bilateral: Secondary | ICD-10-CM

## 2020-05-11 MED ORDER — NEOMYCIN-POLYMYXIN-DEXAMETH 3.5-10000-0.1 OP OINT
1.0000 | TOPICAL_OINTMENT | Freq: Four times a day (QID) | OPHTHALMIC | 2 refills | Status: DC
Start: 2020-05-11 — End: 2020-12-08

## 2020-05-12 NOTE — Progress Notes (Signed)
[facility] Note  05/16/2020     CHIEF COMPLAINT Patient presents for Post-op Follow-up   HISTORY OF PRESENT ILLNESS: Todd Weeks is a 64 y.o. male who presents to the clinic today for:  HPI    Post-op Follow-up    In right eye.  Discomfort includes itching.  I, the attending physician,  performed the HPI with the patient and updated documentation appropriately.          Comments    10 day post op RD repair OD 05/06/2020-  OD is about the same, maybe a little better since Wednesday.  Denies pain but a lot of itching this morning.  Using, Prednisolone QID, Cosopt BID, and Maxitrol ung QID OD.  He tried to get a refill on Maxitrol but was told it "wasn't time to refill it yet"       Last edited by Bernarda Caffey, MD on 05/16/2020 11:20 AM. (History)    Pt states he noticed this morning that his vision is getting better, he states he did the face tilt, chin down position over the weekend "the best I could"  Referring physician: Vivi Barrack, MD [place],  [place] [address]  HISTORICAL INFORMATION:   Selected notes from the [facility]: Current Outpatient Medications (Ophthalmic Drugs)  Medication Sig  . neomycin-polymyxin b-dexamethasone (MAXITROL) 3.5-10000-0.1 OINT Place 1 application into the right eye 4 (four) times daily.   No current facility-administered medications for this visit. (Ophthalmic Drugs)   Current Outpatient Medications (Other)  Medication Sig  . atorvastatin (LIPITOR) 40 MG tablet Take 40 mg by mouth daily.  . clopidogrel (PLAVIX) 75 MG tablet TAKE (1) TABLET BY MOUTH ONCE DAILY. (Patient taking differently: Take 75 mg by mouth daily. TAKE (1) TABLET BY MOUTH ONCE DAILY.)  . famotidine (PEPCID) 20 MG tablet One daily (Patient taking differently: Take 20 mg by mouth daily. One daily)  . FLUoxetine (PROZAC) 20 MG capsule Take 1 capsule (20 mg total) by mouth every morning.  .  irbesartan (AVAPRO) 300 MG tablet Take 1 tablet (300 mg total) by mouth daily.  . Multiple Vitamins-Minerals (MULTIVITAMIN WITH MINERALS) tablet Take 1 tablet by mouth daily.  . simvastatin (ZOCOR) 40 MG tablet Take 40 mg by mouth daily.   . tamsulosin (FLOMAX) 0.4 MG CAPS capsule Take 1 capsule (0.4 mg total) by mouth daily.  . bacitracin 500 UNIT/GM ointment Apply 1 application topically 2 (two) times daily. (Patient not taking: Reported on 05/16/2020)   No current facility-administered medications for this visit. (Other)      REVIEW OF SYSTEMS: ROS    Positive for: Gastrointestinal, Cardiovascular, Eyes, Psychiatric   Negative for: Constitutional, Neurological, Skin, Genitourinary, Musculoskeletal, HENT, Endocrine, Respiratory, Allergic/Imm, Heme/Lymph   Last edited by Leonie Douglas, COA on 05/16/2020  8:12 AM. (History)       ALLERGIES No Known Allergies  PAST MEDICAL HISTORY Past Medical History:  Diagnosis Date  . Bell's palsy   . Closed nondisplaced fracture of neck of third metacarpal bone of right hand 02/04/2017  . Clotting disorder ([place])   . Depression   . Diverticulitis   . GERD (gastroesophageal reflux disease)   . Headache(784.0)   . HTN (hypertension)   . Hyperlipidemia   . Mild CAD 2012  . PVD (peripheral vascular disease) ([place])   . Sexual dysfunction    Past Surgical History:  Procedure Laterality Date  . ABDOMINAL AORTOGRAM W/LOWER EXTREMITY  Bilateral 02/05/2020   Procedure: ABDOMINAL AORTOGRAM W/LOWER EXTREMITY;  Surgeon: Elam Dutch, MD;  Location: [facility];  Service: Cardiovascular;  Laterality: Bilateral;  . PROSTATE BIOPSY  2015    FAMILY HISTORY Family History  Problem Relation Age of Onset  . Hypertension Mother   . Deep vein thrombosis Father   . Diabetes Brother   . Tuberculosis Maternal Grandfather   . Emphysema Maternal Grandfather   . Lung disease Brother   . Asthma Son   . Rectal cancer Neg Hx   . Colon cancer Neg Hx      SOCIAL HISTORY Social History   Tobacco Use  . Smoking status: Former Smoker    Packs/day: 1.50    Years: 46.00    Pack years: 69.00    Types: Cigarettes    Quit date: 12/12/2011    Years since quitting: 8.4  . Smokeless tobacco: Never Used  Vaping Use  . Vaping Use: Never used  Substance Use Topics  . Alcohol use: Yes    Alcohol/week: 1.0 standard drink    Types: 1 Cans of beer per week    Comment: a couple of beer on a weekend  . Drug use: Yes    Types: Cocaine, Marijuana, "Crack" cocaine    Comment: november         OPHTHALMIC EXAM:  Base Eye Exam    Visual Acuity (Snellen - Linear)      Right Left   Dist [place] 20/60 20/30 +2   Dist ph [place] 20/60 +1        Tonometry (Tonopen, 8:16 AM)      Right Left   Pressure 14 15       Visual Fields (Counting fingers)      Left Right    Full Full       Extraocular Movement      Right Left    Full Full       Neuro/Psych    Oriented x3: Yes   Mood/Affect: Normal       Dilation    Both eyes: 1.0% Mydriacyl, 2.5% Phenylephrine @ 8:16 AM        Slit Lamp and Fundus Exam    External Exam      Right Left   External periorbital edema Normal       Slit Lamp Exam      Right Left   Lids/Lashes Dermatochalasis - upper lid Normal   Conjunctiva/Sclera chemosis 360 -- improved, mild Subconjunctival hemorrhage White and quiet   Cornea 2-3+Punctate epithelial erosions trace Punctate epithelial erosions   Anterior Chamber deep, narrow temporal angle Deep and quiet   Iris Round and dilated Round and dilated   Lens 1+ Nuclear sclerosis, 1+ Cortical cataract, focal cortical wedge at 0600 1+ Nuclear sclerosis, 1+ Cortical cataract   Vitreous Vitreous syneresis, +pigment, Posterior vitreous detachment, vitreous condensations, 15-20% gas bubble with satellite temporally Vitreous syneresis, Posterior vitreous detachment       Fundus Exam      Right Left   Disc Pink and Sharp Pink and Sharp   C/D Ratio 0.5 0.6   Macula  Flat, Blunted foveal reflex, trace residual SRF, ERM with striae Flat, Blunted foveal reflex, mild ERM, No heme or edema   Vessels attenuated, Tortuous attenuated, mild tortuousity   Periphery Temporal detachment improving, some mild shifting of SRF inferiorly -- improvement from prior, HST at 1030 w/ good cryo changes surrounding, VR tuft at 0300 w/ good cryo changes Attached,  HST at 0130 no SRF--good laser changes surrounding, mild inferior paving stone degeneration          IMAGING AND PROCEDURES  Imaging and Procedures for 05/16/2020  OCT, Retina - OU - Both Eyes       Right Eye Quality was good. Central Foveal Thickness: 477. Progression has improved. Findings include subretinal fluid, abnormal foveal contour, epiretinal membrane, no IRF, macular pucker (Mild Interval decrease in SRF inferiorly; central ERM w/ pucker).   Left Eye Quality was good. Central Foveal Thickness: 284. Progression has been stable. Findings include normal foveal contour, no IRF, no SRF, epiretinal membrane.   Notes *Images captured and stored on drive  Diagnosis / Impression:  OD: mild Interval decrease in SRF inferiorly; central ERM w/ pucker OS: NFP, no IRF/SRF  Clinical management:  See below  Abbreviations: NFP - Normal foveal profile. CME - cystoid macular edema. PED - pigment epithelial detachment. IRF - intraretinal fluid. SRF - subretinal fluid. EZ - ellipsoid zone. ERM - epiretinal membrane. ORA - outer retinal atrophy. ORT - outer retinal tubulation. SRHM - subretinal hyper-reflective material. IRHM - intraretinal hyper-reflective material                 ASSESSMENT/PLAN:    ICD-10-CM   1. Right retinal detachment  H33.21   2. Retinal tear of left eye  H33.312   3. Retinal edema  H35.81 OCT, Retina - OU - Both Eyes  4. Essential hypertension  I10   5. Hypertensive retinopathy of both eyes  H35.033   6. Combined forms of age-related cataract of both eyes  H25.813     1,2.  Rhegmatogenous retinal detachment, OD - bullous superior mac off detachment, onset of foveal involvement Thurday, 09.23.21 by pt history - detached temporally from 0730 to 11 oclock, fovea off, horse shoe tear at 1030 - s/p pneumatic retinopexy w/ C3F8 OD 09.24.21 - BCVA 20/60 - IOP 14 - gas bubble at 15-20% with small satellite temporally - temporal detachment improving - OCT shows mild residual SRF inferiorly-- slow to pump out - discussed possible need for surgery - continue Maxitrol ung QID OD - cont Cosopt BID OD - cont PF QID OD - discussed importance of positioning -- head tilt chin down positioning  - f/u Wednesday at 815 am for POV -- possible gas injection  3. Retinal tear, OS - horse shoe tear located at 0130 - s/p laser retinopexy OS 09.24.21 - good laser changes in place  4,5. Hypertensive retinopathy OU - discussed importance of tight BP control - monitor  6. Mixed Cataract OU - The symptoms of cataract, surgical options, and treatments and risks were discussed with patient. - discussed diagnosis and progression - not yet visually significant - monitor for now    Ophthalmic Meds Ordered this visit:  No orders of the defined types were placed in this encounter.      Return in about 2 days (around 05/18/2020) for f/u RD OD, DFE, OCT.  There are no Patient Instructions on file for this visit.  This document serves as a record of services personally performed by Gardiner Sleeper, MD, PhD. It was created on their behalf by Leeann Must, [place], an ophthalmic technician. The creation of this record is the provider's dictation and/or activities during the visit.    Electronically signed by: Leeann Must, COA 09.30.2021 11:30 AM   This document serves as a record of services personally performed by Gardiner Sleeper, MD, PhD. It was created on their  behalf by San Jetty. Owens Shark, OA an ophthalmic technician. The creation of this record is the provider's dictation and/or  activities during the visit.    Electronically signed by: San Jetty. Owens Shark, [place] 10.04.2021 11:30 AM  Gardiner Sleeper, M.D., Ph.D. Diseases & Surgery of the Retina and [place] 05/16/2020   I have reviewed the above documentation for accuracy and completeness, and I agree with the above. Gardiner Sleeper, M.D., Ph.D. 05/16/20 11:30 AM  Abbreviations: M myopia (nearsighted); A astigmatism; H hyperopia (farsighted); P presbyopia; Mrx spectacle prescription;  CTL contact lenses; OD right eye; OS left eye; OU both eyes  XT exotropia; ET esotropia; PEK punctate epithelial keratitis; PEE punctate epithelial erosions; DES dry eye syndrome; MGD meibomian gland dysfunction; ATs artificial tears; PFAT's preservative free artificial tears; [place] nuclear sclerotic cataract; PSC posterior subcapsular cataract; ERM epi-retinal membrane; PVD posterior vitreous detachment; RD retinal detachment; DM diabetes mellitus; DR diabetic retinopathy; NPDR non-proliferative diabetic retinopathy; PDR proliferative diabetic retinopathy; CSME clinically significant macular edema; DME diabetic macular edema; dbh dot blot hemorrhages; CWS cotton wool spot; POAG primary open angle glaucoma; C/D cup-to-disc ratio; HVF humphrey visual field; GVF goldmann visual field; OCT optical coherence tomography; IOP intraocular pressure; BRVO Branch retinal vein occlusion; CRVO central retinal vein occlusion; CRAO central retinal artery occlusion; BRAO branch retinal artery occlusion; RT retinal tear; SB scleral buckle; PPV pars plana vitrectomy; VH Vitreous hemorrhage; PRP panretinal laser photocoagulation; IVK intravitreal kenalog; VMT vitreomacular traction; MH Macular hole;  NVD neovascularization of the disc; NVE neovascularization elsewhere; AREDS age related eye disease study; ARMD age related macular degeneration; POAG primary open angle glaucoma; EBMD epithelial/anterior basement membrane dystrophy; ACIOL  anterior chamber intraocular lens; IOL intraocular lens; PCIOL posterior chamber intraocular lens; Phaco/IOL phacoemulsification with intraocular lens placement; [place] photorefractive keratectomy; LASIK laser assisted in situ keratomileusis; HTN hypertension; DM diabetes mellitus; COPD chronic obstructive pulmonary disease

## 2020-05-16 ENCOUNTER — Other Ambulatory Visit: Payer: Self-pay

## 2020-05-16 ENCOUNTER — Ambulatory Visit (INDEPENDENT_AMBULATORY_CARE_PROVIDER_SITE_OTHER): Payer: PPO | Admitting: Ophthalmology

## 2020-05-16 ENCOUNTER — Encounter (INDEPENDENT_AMBULATORY_CARE_PROVIDER_SITE_OTHER): Payer: Self-pay | Admitting: Ophthalmology

## 2020-05-16 DIAGNOSIS — H33312 Horseshoe tear of retina without detachment, left eye: Secondary | ICD-10-CM

## 2020-05-16 DIAGNOSIS — H25813 Combined forms of age-related cataract, bilateral: Secondary | ICD-10-CM

## 2020-05-16 DIAGNOSIS — H3581 Retinal edema: Secondary | ICD-10-CM | POA: Diagnosis not present

## 2020-05-16 DIAGNOSIS — H35033 Hypertensive retinopathy, bilateral: Secondary | ICD-10-CM

## 2020-05-16 DIAGNOSIS — H3321 Serous retinal detachment, right eye: Secondary | ICD-10-CM

## 2020-05-16 DIAGNOSIS — I10 Essential (primary) hypertension: Secondary | ICD-10-CM

## 2020-05-18 ENCOUNTER — Ambulatory Visit (INDEPENDENT_AMBULATORY_CARE_PROVIDER_SITE_OTHER): Payer: PPO | Admitting: Ophthalmology

## 2020-05-18 ENCOUNTER — Encounter (INDEPENDENT_AMBULATORY_CARE_PROVIDER_SITE_OTHER): Payer: Self-pay | Admitting: Ophthalmology

## 2020-05-18 ENCOUNTER — Other Ambulatory Visit: Payer: Self-pay

## 2020-05-18 DIAGNOSIS — H25813 Combined forms of age-related cataract, bilateral: Secondary | ICD-10-CM

## 2020-05-18 DIAGNOSIS — H3581 Retinal edema: Secondary | ICD-10-CM | POA: Diagnosis not present

## 2020-05-18 DIAGNOSIS — H35033 Hypertensive retinopathy, bilateral: Secondary | ICD-10-CM

## 2020-05-18 DIAGNOSIS — I10 Essential (primary) hypertension: Secondary | ICD-10-CM

## 2020-05-18 DIAGNOSIS — H35371 Puckering of macula, right eye: Secondary | ICD-10-CM

## 2020-05-18 DIAGNOSIS — H33312 Horseshoe tear of retina without detachment, left eye: Secondary | ICD-10-CM

## 2020-05-18 DIAGNOSIS — H3321 Serous retinal detachment, right eye: Secondary | ICD-10-CM

## 2020-05-18 NOTE — Progress Notes (Signed)
[facility] Note  05/18/2020     CHIEF COMPLAINT Patient presents for Retina Follow Up   HISTORY OF PRESENT ILLNESS: Todd Weeks is a 64 y.o. male who presents to the clinic today for:  HPI    Retina Follow Up    Patient presents with  Retinal Break/Detachment.  In right eye.  This started 2 days ago.  I, the attending physician,  performed the HPI with the patient and updated documentation appropriately.          Comments    Patient here for 2 days retina follow up for RD OD. Patient states vision doing not good. No eye pain. Just itchy.       Last edited by Bernarda Caffey, MD on 05/18/2020  9:33 AM. (History)    Pt states he does not feel like his his is getting any better, he thinks he is doing 75% face down time  Referring physician: Vivi Barrack, MD [place],  [place] [address]  HISTORICAL INFORMATION:   Selected notes from the [facility]: Current Outpatient Medications (Ophthalmic Drugs)  Medication Sig  . neomycin-polymyxin b-dexamethasone (MAXITROL) 3.5-10000-0.1 OINT Place 1 application into the right eye 4 (four) times daily.   No current facility-administered medications for this visit. (Ophthalmic Drugs)   Current Outpatient Medications (Other)  Medication Sig  . atorvastatin (LIPITOR) 40 MG tablet Take 40 mg by mouth daily.  . bacitracin 500 UNIT/GM ointment Apply 1 application topically 2 (two) times daily. (Patient not taking: Reported on 05/16/2020)  . clopidogrel (PLAVIX) 75 MG tablet TAKE (1) TABLET BY MOUTH ONCE DAILY. (Patient taking differently: Take 75 mg by mouth daily. TAKE (1) TABLET BY MOUTH ONCE DAILY.)  . famotidine (PEPCID) 20 MG tablet One daily (Patient taking differently: Take 20 mg by mouth daily. One daily)  . FLUoxetine (PROZAC) 20 MG capsule Take 1 capsule (20 mg total) by mouth every morning.  . irbesartan (AVAPRO) 300 MG tablet Take 1 tablet (300 mg total) by  mouth daily.  . Multiple Vitamins-Minerals (MULTIVITAMIN WITH MINERALS) tablet Take 1 tablet by mouth daily.  . simvastatin (ZOCOR) 40 MG tablet Take 40 mg by mouth daily.   . tamsulosin (FLOMAX) 0.4 MG CAPS capsule Take 1 capsule (0.4 mg total) by mouth daily.   No current facility-administered medications for this visit. (Other)      REVIEW OF SYSTEMS: ROS    Positive for: Gastrointestinal, Cardiovascular, Eyes, Psychiatric   Negative for: Constitutional, Neurological, Skin, Genitourinary, Musculoskeletal, HENT, Endocrine, Respiratory, Allergic/Imm, Heme/Lymph   Last edited by Theodore Demark, COA on 05/18/2020  8:20 AM. (History)       ALLERGIES No Known Allergies  PAST MEDICAL HISTORY Past Medical History:  Diagnosis Date  . Bell's palsy   . Closed nondisplaced fracture of neck of third metacarpal bone of right hand 02/04/2017  . Clotting disorder ([place])   . Depression   . Diverticulitis   . GERD (gastroesophageal reflux disease)   . Headache(784.0)   . HTN (hypertension)   . Hyperlipidemia   . Mild CAD 2012  . PVD (peripheral vascular disease) ([place])   . Sexual dysfunction    Past Surgical History:  Procedure Laterality Date  . ABDOMINAL AORTOGRAM W/LOWER EXTREMITY Bilateral 02/05/2020   Procedure: ABDOMINAL AORTOGRAM W/LOWER EXTREMITY;  Surgeon: Elam Dutch, MD;  Location: [facility];  Service: Cardiovascular;  Laterality: Bilateral;  . PROSTATE BIOPSY  2015    FAMILY HISTORY Family History  Problem Relation Age of Onset  . Hypertension Mother   . Deep vein thrombosis Father   . Diabetes Brother   . Tuberculosis Maternal Grandfather   . Emphysema Maternal Grandfather   . Lung disease Brother   . Asthma Son   . Rectal cancer Neg Hx   . Colon cancer Neg Hx     SOCIAL HISTORY Social History   Tobacco Use  . Smoking status: Former Smoker    Packs/day: 1.50    Years: 46.00    Pack years: 69.00    Types: Cigarettes    Quit date: 12/12/2011     Years since quitting: 8.4  . Smokeless tobacco: Never Used  Vaping Use  . Vaping Use: Never used  Substance Use Topics  . Alcohol use: Yes    Alcohol/week: 1.0 standard drink    Types: 1 Cans of beer per week    Comment: a couple of beer on a weekend  . Drug use: Yes    Types: Cocaine, Marijuana, "Crack" cocaine    Comment: november         OPHTHALMIC EXAM:  Base Eye Exam    Visual Acuity (Snellen - Linear)      Right Left   Dist [place] 20/70 -2 20/25 -2   Dist ph [place] NI 20/20       Tonometry (Tonopen, 8:17 AM)      Right Left   Pressure 14 19       Pupils      Dark Light Shape React APD   Right 3 2 Round Minimal None   Left 3 2 Round Minimal None       Visual Fields (Counting fingers)      Left Right    Full        Extraocular Movement      Right Left    Full Full       Neuro/Psych    Oriented x3: Yes   Mood/Affect: Normal       Dilation    Both eyes: 1.0% Mydriacyl, 2.5% Phenylephrine @ 8:16 AM        Slit Lamp and Fundus Exam    External Exam      Right Left   External periorbital edema Normal       Slit Lamp Exam      Right Left   Lids/Lashes Dermatochalasis - upper lid Normal   Conjunctiva/Sclera Mild chemosis  White and quiet   Cornea 1+Punctate epithelial erosions trace Punctate epithelial erosions   Anterior Chamber deep, narrow temporal angle Deep and quiet   Iris Round and dilated Round and dilated   Lens 2+ Nuclear sclerosis, 2+ Cortical cataract, focal cortical wedge at 0600 1+ Nuclear sclerosis, 1+ Cortical cataract   Vitreous Vitreous syneresis, +pigment, Posterior vitreous detachment, vitreous condensations, 15-20% gas bubble with 2 satellites temporally Vitreous syneresis, Posterior vitreous detachment       Fundus Exam      Right Left   Disc Pink and Sharp Pink and Sharp   C/D Ratio 0.5 0.6   Macula Flat, Blunted foveal reflex, trace residual SRF, ERM with striae Flat, Blunted foveal reflex, mild ERM, No heme or edema    Vessels attenuated, Tortuous attenuated, mild tortuousity   Periphery Temporal detachment improving, some shallow residual SRF inferiorly, HST at 1030 -- sealed w/ good cryo changes surrounding, VR tuft at 0300 w/ good cryo changes Attached, HST at 0130 no SRF--good  laser changes surrounding, mild inferior paving stone degeneration        Refraction    Wearing Rx      Sphere Cylinder Axis Add   Right +1.00 +1.50 178 +2.00   Left +0.50 +1.25 166 +2.00          IMAGING AND PROCEDURES  Imaging and Procedures for 05/18/2020  OCT, Retina - OU - Both Eyes       Right Eye Quality was good. Central Foveal Thickness: 502. Progression has been stable. Findings include subretinal fluid, abnormal foveal contour, epiretinal membrane, no IRF, macular pucker (Persistent SRF inferiorly - no significant improvement from 05/16/20; central ERM w/ pucker).   Left Eye Quality was good. Central Foveal Thickness: 286. Progression has been stable. Findings include normal foveal contour, no IRF, no SRF, epiretinal membrane.   Notes *Images captured and stored on drive  Diagnosis / Impression:  OD: Persistent SRF inferiorly - no significant improvement from 05/16/20; central ERM w/ pucker OS: NFP, no IRF/SRF  Clinical management:  See below  Abbreviations: NFP - Normal foveal profile. CME - cystoid macular edema. PED - pigment epithelial detachment. IRF - intraretinal fluid. SRF - subretinal fluid. EZ - ellipsoid zone. ERM - epiretinal membrane. ORA - outer retinal atrophy. ORT - outer retinal tubulation. SRHM - subretinal hyper-reflective material. IRHM - intraretinal hyper-reflective material         PROCEDURE NOTE  Diagnosis:  Retinal detachment with retinal tear, RIGHT EYE  Procedure:  100% C3F8 gas injection, RIGHT EYE  Surgeon: Bernarda Caffey, M.D.,Ph.D.  Anesthesia:  Subconjunctival Lidocaine   The patient was brought to the procedure room. Informed consent was obtained for  intravitreal gas injection. The diagnosis was reviewed with the patient and all questions were answered. The risks of the procedure including potential systemic risks like stroke and heart attack and other thrombotic events as well as the local risks like infection, endophthalmitis, retinal detachment were discussed. The patient consented to the procedure.   The RIGHT eye was marked and a time out was performed identifying the correct eye. Subsequently, the patient was placed in the supine position. Topical anesthetic drops were given and a lid speculum was placed to expose the eye. Subsequently, 2% Lidocaine was injected subconjunctivally in the superotemporal quadrant giving adequate anesthesia. The superotemporal quadrant was then cleaned with Betadine and allowed to dry. At this time, 4 mm posterior to the limbus utilizing a 30-gauge needle, 0.25 cc of filtered, 100% C3F8 gas was injected into the vitreous cavity under direct visualization. The needle was then withdrawn from the eye and the retina and gas bubble were visualized. An AC tap was performed to normalize the pressure and the central retinal artery was noted to be patent. Betadine was then reapplied to the injection sites and then rinsed with sterile BSS. A drop of polymixin ophthalmic soln and Maxitrol ung were applied to the eye. There were no complications. Discharge and post-operative instructions were reviewed.         ASSESSMENT/PLAN:    ICD-10-CM   1. Right retinal detachment  H33.21   2. Retinal edema  H35.81 OCT, Retina - OU - Both Eyes  3. Epiretinal membrane (ERM) of right eye  H35.371   4. Retinal tear of left eye  H33.312   5. Essential hypertension  I10   6. Hypertensive retinopathy of both eyes  H35.033   7. Combined forms of age-related cataract of both eyes  H25.813     1,2. Rhegmatogenous retinal detachment,  OD - bullous superior mac off detachment, onset of foveal involvement Thurday, 09.23.21 by pt history -  detached temporally from 0730 to 11 oclock, fovea off, horse shoe tear at 1030 - s/p pneumatic retinopexy w/ C3F8 OD 09.24.21 - BCVA 20/70 - IOP 14 - gas bubble at 15-20% with small satellites temporally - OCT shows mild residual SRF inferiorly-- slow to pump out - discussed possible need for surgery - recommend supplemental C3F8 gas injection (2.5cc, 100% C3F8) today, 10.6.21 - pt wishes to proceed  - RBA of procedure discussed, questions answered  - informed consent obtained and signed - see procedure note  - continue Maxitrol ung QID OD - cont Cosopt BID OD - cont PF QID OD - discussed importance of positioning -- head tilt chin down positioning  - f/u Thursday 815 am for POV   3. Epiretinal membrane, right eye  - The natural history, anatomy, potential for loss of vision, and treatment options including vitrectomy techniques and the complications of endophthalmitis, retinal detachment, vitreous hemorrhage, cataract progression and permanent vision loss discussed with the patient. - +ERM w/ pucker likely impacting RD and SRF OD - BCVA 20/70 - monitor for now, but may need PPV w/ peel  4. Retinal tear, OS - horse shoe tear located at 0130 - s/p laser retinopexy OS 09.24.21 - good laser changes in place  5,6. Hypertensive retinopathy OU - discussed importance of tight BP control - monitor  7. Mixed Cataract OU - The symptoms of cataract, surgical options, and treatments and risks were discussed with patient. - discussed diagnosis and progression - not yet visually significant - monitor for now    Ophthalmic Meds Ordered this visit:  No orders of the defined types were placed in this encounter.      Return for f/u tomorrown 815am, RD OD, DFE, OCT.  There are no Patient Instructions on file for this visit.  This document serves as a record of services personally performed by Gardiner Sleeper, MD, PhD. It was created on their behalf by San Jetty. Owens Shark, OA an ophthalmic  technician. The creation of this record is the provider's dictation and/or activities during the visit.    Electronically signed by: San Jetty. Owens Shark, [place] 10.06.2021 9:42 AM  Gardiner Sleeper, M.D., Ph.D. Diseases & Surgery of the Retina and Vitreous [facility]  I have reviewed the above documentation for accuracy and completeness, and I agree with the above. Gardiner Sleeper, M.D., Ph.D. 05/18/20 9:42 AM    Abbreviations: M myopia (nearsighted); A astigmatism; H hyperopia (farsighted); P presbyopia; Mrx spectacle prescription;  CTL contact lenses; OD right eye; OS left eye; OU both eyes  XT exotropia; ET esotropia; PEK punctate epithelial keratitis; PEE punctate epithelial erosions; DES dry eye syndrome; MGD meibomian gland dysfunction; ATs artificial tears; PFAT's preservative free artificial tears; [place] nuclear sclerotic cataract; PSC posterior subcapsular cataract; ERM epi-retinal membrane; PVD posterior vitreous detachment; RD retinal detachment; DM diabetes mellitus; DR diabetic retinopathy; NPDR non-proliferative diabetic retinopathy; PDR proliferative diabetic retinopathy; CSME clinically significant macular edema; DME diabetic macular edema; dbh dot blot hemorrhages; CWS cotton wool spot; POAG primary open angle glaucoma; C/D cup-to-disc ratio; HVF humphrey visual field; GVF goldmann visual field; OCT optical coherence tomography; IOP intraocular pressure; BRVO Branch retinal vein occlusion; CRVO central retinal vein occlusion; CRAO central retinal artery occlusion; BRAO branch retinal artery occlusion; RT retinal tear; SB scleral buckle; PPV pars plana vitrectomy; VH Vitreous hemorrhage; PRP panretinal laser photocoagulation; IVK intravitreal  kenalog; VMT vitreomacular traction; MH Macular hole;  NVD neovascularization of the disc; NVE neovascularization elsewhere; AREDS age related eye disease study; ARMD age related macular degeneration; POAG primary open angle glaucoma;  EBMD epithelial/anterior basement membrane dystrophy; ACIOL anterior chamber intraocular lens; IOL intraocular lens; PCIOL posterior chamber intraocular lens; Phaco/IOL phacoemulsification with intraocular lens placement; [place] photorefractive keratectomy; LASIK laser assisted in situ keratomileusis; HTN hypertension; DM diabetes mellitus; COPD chronic obstructive pulmonary disease

## 2020-05-18 NOTE — Progress Notes (Signed)
[facility] Note  05/19/2020     CHIEF COMPLAINT Patient presents for Post-op Follow-up   HISTORY OF PRESENT ILLNESS: Todd Weeks is a 64 y.o. male who presents to the clinic today for:  HPI    Post-op Follow-up    In right eye.  Discomfort includes floaters.  Vision is blurred at distance, is blurred at near and is worse.  I, the attending physician,  performed the HPI with the patient and updated documentation appropriately.          Comments    64 y/o male pt here for 1 day f/u s/p pneumatic retinopexy w/C3F8 OD 9.24.21 and supplemental gas injection 10.6.21.  Pt had difficulty sleeping last night due to discomfort of positioning.  VA OD still very blurred this a.m., but floaters seem to have decreased some.  Denies pain, FOL.  Maxitrol ung QID OD, Cosopt BID OD, PF QID OD.       Last edited by Bernarda Caffey, MD on 05/19/2020  3:08 PM. (History)    Pt states he slept most of the night sitting up with his head tilted down  Referring physician: Vivi Barrack, MD [place],  [place] [address]  HISTORICAL INFORMATION:   Selected notes from the MEDICAL RECORD NUMBER    CURRENT MEDICATIONS: Current Outpatient Medications (Ophthalmic Drugs)  Medication Sig  . dorzolamide-timolol (COSOPT) 22.3-6.8 MG/ML ophthalmic solution Place 1 drop into the right eye 2 (two) times daily.  Marland Kitchen neomycin-polymyxin b-dexamethasone (MAXITROL) 3.5-10000-0.1 OINT Place 1 application into the right eye 4 (four) times daily.  . prednisoLONE acetate (PRED FORTE) 1 % ophthalmic suspension Place 1 drop into both eyes 4 (four) times daily.   No current facility-administered medications for this visit. (Ophthalmic Drugs)   Current Outpatient Medications (Other)  Medication Sig  . clopidogrel (PLAVIX) 75 MG tablet TAKE (1) TABLET BY MOUTH ONCE DAILY. (Patient taking differently: Take 75 mg by mouth daily. TAKE (1) TABLET BY MOUTH ONCE DAILY.)  . famotidine (PEPCID)  20 MG tablet One daily (Patient taking differently: Take 20 mg by mouth daily. )  . FLUoxetine (PROZAC) 20 MG capsule Take 1 capsule (20 mg total) by mouth every morning.  . irbesartan (AVAPRO) 300 MG tablet Take 1 tablet (300 mg total) by mouth daily.  . Multiple Vitamins-Minerals (MULTIVITAMIN WITH MINERALS) tablet Take 1 tablet by mouth daily.  . simvastatin (ZOCOR) 40 MG tablet Take 40 mg by mouth daily.   . tamsulosin (FLOMAX) 0.4 MG CAPS capsule Take 1 capsule (0.4 mg total) by mouth daily.  . bacitracin 500 UNIT/GM ointment Apply 1 application topically 2 (two) times daily. (Patient not taking: Reported on 05/19/2020)   No current facility-administered medications for this visit. (Other)      REVIEW OF SYSTEMS: ROS    Positive for: Gastrointestinal, Cardiovascular, Eyes   Negative for: Constitutional, Neurological, Skin, Genitourinary, Musculoskeletal, HENT, Endocrine, Respiratory, Psychiatric, Allergic/Imm, Heme/Lymph   Last edited by Matthew Folks, COA on 05/19/2020  8:16 AM. (History)       ALLERGIES No Known Allergies  PAST MEDICAL HISTORY Past Medical History:  Diagnosis Date  . Bell's palsy   . Cataract    Mixed form OU  . Closed nondisplaced fracture of neck of third metacarpal bone of right hand 02/04/2017  . Clotting disorder ([place])   . Depression   . Diverticulitis   . GERD (gastroesophageal reflux disease)   . Headache(784.0)   . HTN (hypertension)   .  Hyperlipidemia   . Hypertensive retinopathy    OU  . Mild CAD 2012  . PVD (peripheral vascular disease) ([place])   . Sexual dysfunction    Past Surgical History:  Procedure Laterality Date  . ABDOMINAL AORTOGRAM W/LOWER EXTREMITY Bilateral 02/05/2020   Procedure: ABDOMINAL AORTOGRAM W/LOWER EXTREMITY;  Surgeon: Elam Dutch, MD;  Location: [facility];  Service: Cardiovascular;  Laterality: Bilateral;  . EYE SURGERY Right 05/06/2020   Pneumatic retinopexy for rheg. RD repair - Dr. Bernarda Caffey   . PROSTATE BIOPSY  2015  . RETINAL DETACHMENT SURGERY Right 05/06/2020   Pneumatic retinopexy for rheg RD repair - Dr. Bernarda Caffey    FAMILY HISTORY Family History  Problem Relation Age of Onset  . Hypertension Mother   . Deep vein thrombosis Father   . Diabetes Brother   . Tuberculosis Maternal Grandfather   . Emphysema Maternal Grandfather   . Lung disease Brother   . Asthma Son   . Rectal cancer Neg Hx   . Colon cancer Neg Hx     SOCIAL HISTORY Social History   Tobacco Use  . Smoking status: Former Smoker    Packs/day: 1.50    Years: 46.00    Pack years: 69.00    Types: Cigarettes    Quit date: 12/12/2011    Years since quitting: 8.4  . Smokeless tobacco: Never Used  Vaping Use  . Vaping Use: Never used  Substance Use Topics  . Alcohol use: Yes    Alcohol/week: 1.0 standard drink    Types: 1 Cans of beer per week    Comment: a couple of beer on a weekend  . Drug use: Yes    Types: Cocaine, Marijuana, "Crack" cocaine    Comment: november         OPHTHALMIC EXAM:  Base Eye Exam    Visual Acuity (Snellen - Linear)      Right Left   Dist [place] 20/80 -2 20/25 -2   Dist ph [place] NI 20/20 -2       Tonometry (Tonopen, 8:21 AM)      Right Left   Pressure 13 17       Pupils      Dark Light Shape React APD   Right 3 2 Round Minimal None   Left 3 2 Round Minimal None       Visual Fields (Counting fingers)      Left Right    Full Full       Extraocular Movement      Right Left    Full, Ortho Full, Ortho       Neuro/Psych    Oriented x3: Yes   Mood/Affect: Normal       Dilation    Both eyes: 1.0% Mydriacyl, 2.5% Phenylephrine @ 8:21 AM        Slit Lamp and Fundus Exam    External Exam      Right Left   External periorbital edema Normal       Slit Lamp Exam      Right Left   Lids/Lashes Dermatochalasis - upper lid Normal   Conjunctiva/Sclera Mild chemosis , Subconjunctival hemorrhage White and quiet   Cornea 1+Punctate epithelial erosions  trace Punctate epithelial erosions   Anterior Chamber deep, narrow temporal angle Deep and quiet   Iris Round and dilated Round and dilated   Lens 2+ Nuclear sclerosis, 2+ Cortical cataract, focal cortical wedge at 0600 1+ Nuclear sclerosis, 1+ Cortical cataract  Vitreous Vitreous syneresis, +pigment, Posterior vitreous detachment, vitreous condensations, 30-35% gas bubble with 2 satellites temporally Vitreous syneresis, Posterior vitreous detachment       Fundus Exam      Right Left   Disc Pink and Sharp Pink and Sharp   C/D Ratio 0.5 0.6   Macula Flat, Blunted foveal reflex, trace residual SRF, ERM with striae Flat, Blunted foveal reflex, mild ERM, No heme or edema   Vessels attenuated, Tortuous attenuated, mild tortuousity   Periphery Temporal detachment improving, some shallow residual SRF inferiorly, HST at 1030 -- sealed w/ good cryo changes surrounding, VR tuft at 0300 w/ good cryo changes Attached, HST at 0130 no SRF--good laser changes surrounding, mild inferior paving stone degeneration          IMAGING AND PROCEDURES  Imaging and Procedures for 05/19/2020  OCT, Retina - OU - Both Eyes       Right Eye Quality was good. Central Foveal Thickness: 495. Progression has been stable. Findings include subretinal fluid, abnormal foveal contour, epiretinal membrane, no IRF, macular pucker (Mild interval improvement in SRF inferiorly - caught on widefield; central ERM w/ pucker).   Left Eye Quality was good. Central Foveal Thickness: 292. Progression has been stable. Findings include normal foveal contour, no IRF, no SRF, epiretinal membrane.   Notes *Images captured and stored on drive  Diagnosis / Impression:  OD: Mild interval improvement in SRF inferiorly - caught on widefield; central ERM w/ pucker OS: NFP, no IRF/SRF  Clinical management:  See below  Abbreviations: NFP - Normal foveal profile. CME - cystoid macular edema. PED - pigment epithelial detachment. IRF -  intraretinal fluid. SRF - subretinal fluid. EZ - ellipsoid zone. ERM - epiretinal membrane. ORA - outer retinal atrophy. ORT - outer retinal tubulation. SRHM - subretinal hyper-reflective material. IRHM - intraretinal hyper-reflective material                ASSESSMENT/PLAN:    ICD-10-CM   1. Right retinal detachment  H33.21   2. Retinal edema  H35.81 OCT, Retina - OU - Both Eyes  3. Epiretinal membrane (ERM) of right eye  H35.371   4. Retinal tear of left eye  H33.312   5. Essential hypertension  I10   6. Hypertensive retinopathy of both eyes  H35.033   7. Combined forms of age-related cataract of both eyes  H25.813     1,2. Rhegmatogenous retinal detachment, OD - bullous superior mac off detachment, onset of foveal involvement Thurday, 09.23.21 by pt history - detached temporally from 0730 to 11 oclock, fovea off, horse shoe tear at 1030 - s/p pneumatic retinopexy w/ C3F8 OD 09.24.21,  - supplemental intravitreal C3F8 gas injection OD (2.5cc on 10.06.21) - BCVA 20/80-2 - IOP 13 - good gas bubble at 30-35% with small satellites temporally - OCT shows mild residual SRF inferiorly-- slow to pump out - discussed possible need for surgery -- case scheduled for May 26, 2020, [facility] OR 8 at 2:30PM - continue Maxitrol ung QID OD - cont Cosopt BID OD - cont PF QID OD - discussed importance of positioning -- head tilt chin down positioning  - f/u Friday 815 am for POV   3. Epiretinal membrane, right eye  - +ERM w/ pucker likely impacting RD and SRF OD - BCVA 20/80 - monitor for now, but may need PPV w/ peel  4. Retinal tear, OS - horse shoe tear located at 0130 - s/p laser retinopexy OS 09.24.21 - good laser changes in  place  5,6. Hypertensive retinopathy OU - discussed importance of tight BP control - monitor  7. Mixed Cataract OU - The symptoms of cataract, surgical options, and treatments and risks were discussed with patient. - discussed diagnosis and progression -  not yet visually significant - monitor for now    Ophthalmic Meds Ordered this visit:  No orders of the defined types were placed in this encounter.      Return in about 1 day (around 05/20/2020) for f/u Friday at 815, RD OD, DFE, OCT.  There are no Patient Instructions on file for this visit.  This document serves as a record of services personally performed by Gardiner Sleeper, MD, PhD. It was created on their behalf by San Jetty. Owens Shark, OA an ophthalmic technician. The creation of this record is the provider's dictation and/or activities during the visit.    Electronically signed by: San Jetty. Owens Shark, [place] 10.06.2021 3:11 PM  Gardiner Sleeper, M.D., Ph.D. Diseases & Surgery of the Retina and Vitreous [facility]  I have reviewed the above documentation for accuracy and completeness, and I agree with the above. Gardiner Sleeper, M.D., Ph.D. 05/19/20 3:11 PM   Abbreviations: M myopia (nearsighted); A astigmatism; H hyperopia (farsighted); P presbyopia; Mrx spectacle prescription;  CTL contact lenses; OD right eye; OS left eye; OU both eyes  XT exotropia; ET esotropia; PEK punctate epithelial keratitis; PEE punctate epithelial erosions; DES dry eye syndrome; MGD meibomian gland dysfunction; ATs artificial tears; PFAT's preservative free artificial tears; [place] nuclear sclerotic cataract; PSC posterior subcapsular cataract; ERM epi-retinal membrane; PVD posterior vitreous detachment; RD retinal detachment; DM diabetes mellitus; DR diabetic retinopathy; NPDR non-proliferative diabetic retinopathy; PDR proliferative diabetic retinopathy; CSME clinically significant macular edema; DME diabetic macular edema; dbh dot blot hemorrhages; CWS cotton wool spot; POAG primary open angle glaucoma; C/D cup-to-disc ratio; HVF humphrey visual field; GVF goldmann visual field; OCT optical coherence tomography; IOP intraocular pressure; BRVO Branch retinal vein occlusion; CRVO central retinal  vein occlusion; CRAO central retinal artery occlusion; BRAO branch retinal artery occlusion; RT retinal tear; SB scleral buckle; PPV pars plana vitrectomy; VH Vitreous hemorrhage; PRP panretinal laser photocoagulation; IVK intravitreal kenalog; VMT vitreomacular traction; MH Macular hole;  NVD neovascularization of the disc; NVE neovascularization elsewhere; AREDS age related eye disease study; ARMD age related macular degeneration; POAG primary open angle glaucoma; EBMD epithelial/anterior basement membrane dystrophy; ACIOL anterior chamber intraocular lens; IOL intraocular lens; PCIOL posterior chamber intraocular lens; Phaco/IOL phacoemulsification with intraocular lens placement; [place] photorefractive keratectomy; LASIK laser assisted in situ keratomileusis; HTN hypertension; DM diabetes mellitus; COPD chronic obstructive pulmonary disease

## 2020-05-19 ENCOUNTER — Inpatient Hospital Stay (HOSPITAL_COMMUNITY): Admit: 2020-05-19 | Payer: PPO

## 2020-05-19 ENCOUNTER — Ambulatory Visit (INDEPENDENT_AMBULATORY_CARE_PROVIDER_SITE_OTHER): Payer: PPO | Admitting: Ophthalmology

## 2020-05-19 ENCOUNTER — Ambulatory Visit: Payer: PPO | Admitting: Vascular Surgery

## 2020-05-19 ENCOUNTER — Encounter (INDEPENDENT_AMBULATORY_CARE_PROVIDER_SITE_OTHER): Payer: Self-pay | Admitting: Ophthalmology

## 2020-05-19 DIAGNOSIS — H33312 Horseshoe tear of retina without detachment, left eye: Secondary | ICD-10-CM

## 2020-05-19 DIAGNOSIS — H3581 Retinal edema: Secondary | ICD-10-CM | POA: Diagnosis not present

## 2020-05-19 DIAGNOSIS — I10 Essential (primary) hypertension: Secondary | ICD-10-CM

## 2020-05-19 DIAGNOSIS — H3321 Serous retinal detachment, right eye: Secondary | ICD-10-CM

## 2020-05-19 DIAGNOSIS — H35033 Hypertensive retinopathy, bilateral: Secondary | ICD-10-CM

## 2020-05-19 DIAGNOSIS — H35371 Puckering of macula, right eye: Secondary | ICD-10-CM

## 2020-05-19 DIAGNOSIS — H25813 Combined forms of age-related cataract, bilateral: Secondary | ICD-10-CM

## 2020-05-20 ENCOUNTER — Encounter (INDEPENDENT_AMBULATORY_CARE_PROVIDER_SITE_OTHER): Payer: PPO | Admitting: Ophthalmology

## 2020-05-20 ENCOUNTER — Other Ambulatory Visit: Payer: Self-pay

## 2020-05-20 ENCOUNTER — Other Ambulatory Visit: Payer: Self-pay | Admitting: Family Medicine

## 2020-05-20 ENCOUNTER — Ambulatory Visit (INDEPENDENT_AMBULATORY_CARE_PROVIDER_SITE_OTHER): Payer: PPO | Admitting: Ophthalmology

## 2020-05-20 ENCOUNTER — Encounter (INDEPENDENT_AMBULATORY_CARE_PROVIDER_SITE_OTHER): Payer: Self-pay | Admitting: Ophthalmology

## 2020-05-20 DIAGNOSIS — H3321 Serous retinal detachment, right eye: Secondary | ICD-10-CM

## 2020-05-20 DIAGNOSIS — F418 Other specified anxiety disorders: Secondary | ICD-10-CM

## 2020-05-20 DIAGNOSIS — H35033 Hypertensive retinopathy, bilateral: Secondary | ICD-10-CM

## 2020-05-20 DIAGNOSIS — H35371 Puckering of macula, right eye: Secondary | ICD-10-CM

## 2020-05-20 DIAGNOSIS — I10 Essential (primary) hypertension: Secondary | ICD-10-CM

## 2020-05-20 DIAGNOSIS — I2510 Atherosclerotic heart disease of native coronary artery without angina pectoris: Secondary | ICD-10-CM

## 2020-05-20 DIAGNOSIS — H25813 Combined forms of age-related cataract, bilateral: Secondary | ICD-10-CM

## 2020-05-20 DIAGNOSIS — H3581 Retinal edema: Secondary | ICD-10-CM

## 2020-05-20 DIAGNOSIS — H33312 Horseshoe tear of retina without detachment, left eye: Secondary | ICD-10-CM

## 2020-05-20 NOTE — Telephone Encounter (Signed)
°  LAST APPOINTMENT DATE: 02/18/2020   NEXT APPOINTMENT DATE:@Visit  date not found  MEDICATION: clopidogrel (PLAVIX) 75 MG tablet // FLUoxetine (PROZAC) 20 MG capsule  PHARMACY: [facility] - [place] - [address]  COMMENTS: Patient states that he currently has a detached retina, having surgery soon and will be down for a month to which patient states he would not be able to do a visit.

## 2020-05-20 NOTE — Progress Notes (Signed)
[facility] Note  05/20/2020     CHIEF COMPLAINT Patient presents for Post-op Follow-up   HISTORY OF PRESENT ILLNESS: Todd Weeks is a 64 y.o. male who presents to the clinic today for:  HPI    Post-op Follow-up    In right eye.  Discomfort includes none.  Negative for pain, itching, foreign body sensation, tearing, discharge and floaters.  Vision is improved.  I, the attending physician,  performed the HPI with the patient and updated documentation appropriately.          Comments    Pt here for 1 day post op f/u for RD OD, pt states he feels like vision has improved today, he states he can only see one satellite bubble now, he feels "tightness" in the mornings, but no eye pain, he is using Maxitrol ung QID, PF QID and Cosopt BID       Last edited by Bernarda Caffey, MD on 05/20/2020 12:52 PM. (History)    Pt states he slept most of the night sitting up with his head tilted down  Referring physician: Vivi Barrack, MD [place],  [place] [address]  HISTORICAL INFORMATION:   Selected notes from the MEDICAL RECORD NUMBER    CURRENT MEDICATIONS: Current Outpatient Medications (Ophthalmic Drugs)  Medication Sig  . dorzolamide-timolol (COSOPT) 22.3-6.8 MG/ML ophthalmic solution Place 1 drop into the right eye 2 (two) times daily.  Marland Kitchen neomycin-polymyxin b-dexamethasone (MAXITROL) 3.5-10000-0.1 OINT Place 1 application into the right eye 4 (four) times daily.  . prednisoLONE acetate (PRED FORTE) 1 % ophthalmic suspension Place 1 drop into both eyes 4 (four) times daily.   No current facility-administered medications for this visit. (Ophthalmic Drugs)   Current Outpatient Medications (Other)  Medication Sig  . bacitracin 500 UNIT/GM ointment Apply 1 application topically 2 (two) times daily. (Patient not taking: Reported on 05/19/2020)  . clopidogrel (PLAVIX) 75 MG tablet TAKE (1) TABLET BY MOUTH ONCE DAILY.  . famotidine (PEPCID) 20 MG  tablet One daily (Patient taking differently: Take 20 mg by mouth daily. )  . FLUoxetine (PROZAC) 20 MG capsule TAKE 1 CAPSULE BY MOUTH EVERY MORNING  . irbesartan (AVAPRO) 300 MG tablet Take 1 tablet (300 mg total) by mouth daily.  . Multiple Vitamins-Minerals (MULTIVITAMIN WITH MINERALS) tablet Take 1 tablet by mouth daily.  . simvastatin (ZOCOR) 40 MG tablet Take 40 mg by mouth daily.   . tamsulosin (FLOMAX) 0.4 MG CAPS capsule Take 1 capsule (0.4 mg total) by mouth daily.   No current facility-administered medications for this visit. (Other)      REVIEW OF SYSTEMS: ROS    Positive for: Eyes   Negative for: Constitutional, Gastrointestinal, Neurological, Skin, Genitourinary, Musculoskeletal, HENT, Endocrine, Cardiovascular, Respiratory, Psychiatric, Allergic/Imm, Heme/Lymph   Last edited by Debbrah Alar, COT on 05/20/2020  8:11 AM. (History)       ALLERGIES No Known Allergies  PAST MEDICAL HISTORY Past Medical History:  Diagnosis Date  . Bell's palsy   . Cataract    Mixed form OU  . Closed nondisplaced fracture of neck of third metacarpal bone of right hand 02/04/2017  . Clotting disorder ([place])   . Depression   . Diverticulitis   . GERD (gastroesophageal reflux disease)   . Headache(784.0)   . HTN (hypertension)   . Hyperlipidemia   . Hypertensive retinopathy    OU  . Mild CAD 2012  . PVD (peripheral vascular disease) ([place])   . Sexual  dysfunction    Past Surgical History:  Procedure Laterality Date  . ABDOMINAL AORTOGRAM W/LOWER EXTREMITY Bilateral 02/05/2020   Procedure: ABDOMINAL AORTOGRAM W/LOWER EXTREMITY;  Surgeon: Elam Dutch, MD;  Location: [facility];  Service: Cardiovascular;  Laterality: Bilateral;  . EYE SURGERY Right 05/06/2020   Pneumatic retinopexy for rheg. RD repair - Dr. Bernarda Caffey  . PROSTATE BIOPSY  2015  . RETINAL DETACHMENT SURGERY Right 05/06/2020   Pneumatic retinopexy for rheg RD repair - Dr. Bernarda Caffey    FAMILY  HISTORY Family History  Problem Relation Age of Onset  . Hypertension Mother   . Deep vein thrombosis Father   . Diabetes Brother   . Tuberculosis Maternal Grandfather   . Emphysema Maternal Grandfather   . Lung disease Brother   . Asthma Son   . Rectal cancer Neg Hx   . Colon cancer Neg Hx     SOCIAL HISTORY Social History   Tobacco Use  . Smoking status: Former Smoker    Packs/day: 1.50    Years: 46.00    Pack years: 69.00    Types: Cigarettes    Quit date: 12/12/2011    Years since quitting: 8.4  . Smokeless tobacco: Never Used  Vaping Use  . Vaping Use: Never used  Substance Use Topics  . Alcohol use: Yes    Alcohol/week: 1.0 standard drink    Types: 1 Cans of beer per week    Comment: a couple of beer on a weekend  . Drug use: Yes    Types: Cocaine, Marijuana, "Crack" cocaine    Comment: november         OPHTHALMIC EXAM:  Base Eye Exam    Visual Acuity (Snellen - Linear)      Right Left   Dist [place] 20/70 20/25 -2   Dist ph [place] 20/60 -1        Tonometry (Tonopen, 8:17 AM)      Right Left   Pressure 13 18       Pupils      Dark Light Shape React APD   Right 4 2 Round Brisk None   Left 4 2 Round Brisk None       Neuro/Psych    Oriented x3: Yes   Mood/Affect: Normal       Dilation    Right eye: 1.0% Mydriacyl, 2.5% Phenylephrine @ 8:17 AM        Slit Lamp and Fundus Exam    External Exam      Right Left   External periorbital edema Normal       Slit Lamp Exam      Right Left   Lids/Lashes Dermatochalasis - upper lid Normal   Conjunctiva/Sclera Mild chemosis , Subconjunctival hemorrhage White and quiet   Cornea 2+Punctate epithelial erosions trace Punctate epithelial erosions   Anterior Chamber deep, narrow temporal angle Deep and quiet   Iris Round and dilated Round and dilated   Lens 2+ Nuclear sclerosis, 2+ Cortical cataract, focal cortical wedge at 0600 1+ Nuclear sclerosis, 1+ Cortical cataract   Vitreous Vitreous syneresis,  +pigment, Posterior vitreous detachment, vitreous condensations, 35% gas bubble with nasal satellite Vitreous syneresis, Posterior vitreous detachment       Fundus Exam      Right Left   Disc Pink and Sharp Pink and Sharp   C/D Ratio 0.5 0.6   Macula Flat, Blunted foveal reflex, trace residual SRF, ERM with striae Flat, Blunted foveal reflex, mild ERM, No heme  or edema   Vessels attenuated, Tortuous attenuated, mild tortuousity   Periphery Temporal detachment improving, some shallow residual SRF inferiorly, HST at 1030 -- sealed w/ good cryo changes surrounding, VR tuft at 0300 w/ good cryo changes Attached, HST at 0130 no SRF--good laser changes surrounding, mild inferior paving stone degeneration          IMAGING AND PROCEDURES  Imaging and Procedures for 05/20/2020  OCT, Retina - OU - Both Eyes       Right Eye Quality was good. Central Foveal Thickness: 495. Progression has improved. Findings include subretinal fluid, abnormal foveal contour, epiretinal membrane, no IRF, macular pucker (Mild interval improvement in SRF inferiorly - caught on widefield; central ERM w/ pucker).   Left Eye Quality was good. Central Foveal Thickness: 290. Progression has been stable. Findings include normal foveal contour, no IRF, no SRF, epiretinal membrane.   Notes *Images captured and stored on drive  Diagnosis / Impression:  OD: Mild interval improvement in SRF inferiorly - caught on widefield; central ERM w/ pucker OS: NFP, no IRF/SRF  Clinical management:  See below  Abbreviations: NFP - Normal foveal profile. CME - cystoid macular edema. PED - pigment epithelial detachment. IRF - intraretinal fluid. SRF - subretinal fluid. EZ - ellipsoid zone. ERM - epiretinal membrane. ORA - outer retinal atrophy. ORT - outer retinal tubulation. SRHM - subretinal hyper-reflective material. IRHM - intraretinal hyper-reflective material                ASSESSMENT/PLAN:    ICD-10-CM   1. Right  retinal detachment  H33.21   2. Retinal edema  H35.81 OCT, Retina - OU - Both Eyes  3. Epiretinal membrane (ERM) of right eye  H35.371   4. Retinal tear of left eye  H33.312   5. Essential hypertension  I10   6. Hypertensive retinopathy of both eyes  H35.033   7. Combined forms of age-related cataract of both eyes  H25.813     1,2. Rhegmatogenous retinal detachment, OD - bullous superior mac off detachment, onset of foveal involvement Thurday, 09.23.21 by pt history - detached temporally from 0730 to 11 oclock, fovea off, horse shoe tear at 1030 - s/p pneumatic retinopexy w/ C3F8 OD 09.24.21,  - supplemental intravitreal C3F8 gas injection OD (2.5cc on 10.06.21) - BCVA 20/60- - IOP 13 - good gas bubble at 35% with nasal satellite - OCT shows mild residual SRF inferiorly-- slow to pump out - discussed possible need for surgery -- case scheduled for May 26, 2020, [facility] OR 8 at 2:30PM - continue Maxitrol ung QID OD - cont Cosopt BID OD - cont PF QID OD - discussed importance of positioning -- head tilt chin down positioning  - f/u Monday 815 am for POV   3. Epiretinal membrane, right eye  - +ERM w/ pucker likely impacting RD and SRF OD - BCVA 20/60- - monitor for now, but may need PPV w/ peel  4. Retinal tear, OS - horse shoe tear located at 0130 - s/p laser retinopexy OS 09.24.21 - good laser changes in place  5,6. Hypertensive retinopathy OU - discussed importance of tight BP control - monitor  7. Mixed Cataract OU - The symptoms of cataract, surgical options, and treatments and risks were discussed with patient. - discussed diagnosis and progression - not yet visually significant - monitor for now    Ophthalmic Meds Ordered this visit:  No orders of the defined types were placed in this encounter.  Return for f/u Monday at 8:15, POV RD OD, DFE, OCT.  There are no Patient Instructions on file for this visit.  This document serves as a record of services  personally performed by Gardiner Sleeper, MD, PhD. It was created on their behalf by San Jetty. Owens Shark, OA an ophthalmic technician. The creation of this record is the provider's dictation and/or activities during the visit.    Electronically signed by: San Jetty. Owens Shark, [place] 10.08.2021 12:54 PM  Gardiner Sleeper, M.D., Ph.D. Diseases & Surgery of the Retina and Vitreous [facility] [place]  I have reviewed the above documentation for accuracy and completeness, and I agree with the above. Gardiner Sleeper, M.D., Ph.D. 05/20/20 12:54 PM    Abbreviations: M myopia (nearsighted); A astigmatism; H hyperopia (farsighted); P presbyopia; Mrx spectacle prescription;  CTL contact lenses; OD right eye; OS left eye; OU both eyes  XT exotropia; ET esotropia; PEK punctate epithelial keratitis; PEE punctate epithelial erosions; DES dry eye syndrome; MGD meibomian gland dysfunction; ATs artificial tears; PFAT's preservative free artificial tears; [place] nuclear sclerotic cataract; PSC posterior subcapsular cataract; ERM epi-retinal membrane; PVD posterior vitreous detachment; RD retinal detachment; DM diabetes mellitus; DR diabetic retinopathy; NPDR non-proliferative diabetic retinopathy; PDR proliferative diabetic retinopathy; CSME clinically significant macular edema; DME diabetic macular edema; dbh dot blot hemorrhages; CWS cotton wool spot; POAG primary open angle glaucoma; C/D cup-to-disc ratio; HVF humphrey visual field; GVF goldmann visual field; OCT optical coherence tomography; IOP intraocular pressure; BRVO Branch retinal vein occlusion; CRVO central retinal vein occlusion; CRAO central retinal artery occlusion; BRAO branch retinal artery occlusion; RT retinal tear; SB scleral buckle; PPV pars plana vitrectomy; VH Vitreous hemorrhage; PRP panretinal laser photocoagulation; IVK intravitreal kenalog; VMT vitreomacular traction; MH Macular hole;  NVD neovascularization of the disc; NVE neovascularization  elsewhere; AREDS age related eye disease study; ARMD age related macular degeneration; POAG primary open angle glaucoma; EBMD epithelial/anterior basement membrane dystrophy; ACIOL anterior chamber intraocular lens; IOL intraocular lens; PCIOL posterior chamber intraocular lens; Phaco/IOL phacoemulsification with intraocular lens placement; [place] photorefractive keratectomy; LASIK laser assisted in situ keratomileusis; HTN hypertension; DM diabetes mellitus; COPD chronic obstructive pulmonary disease

## 2020-05-20 NOTE — Telephone Encounter (Signed)
Mediations sent to pharmacy.

## 2020-05-23 ENCOUNTER — Ambulatory Visit (INDEPENDENT_AMBULATORY_CARE_PROVIDER_SITE_OTHER): Payer: PPO | Admitting: Ophthalmology

## 2020-05-23 ENCOUNTER — Encounter (INDEPENDENT_AMBULATORY_CARE_PROVIDER_SITE_OTHER): Payer: Self-pay | Admitting: Ophthalmology

## 2020-05-23 ENCOUNTER — Other Ambulatory Visit: Payer: Self-pay

## 2020-05-23 DIAGNOSIS — I10 Essential (primary) hypertension: Secondary | ICD-10-CM

## 2020-05-23 DIAGNOSIS — H3321 Serous retinal detachment, right eye: Secondary | ICD-10-CM

## 2020-05-23 DIAGNOSIS — H3581 Retinal edema: Secondary | ICD-10-CM | POA: Diagnosis not present

## 2020-05-23 DIAGNOSIS — H25813 Combined forms of age-related cataract, bilateral: Secondary | ICD-10-CM

## 2020-05-23 DIAGNOSIS — H35371 Puckering of macula, right eye: Secondary | ICD-10-CM

## 2020-05-23 DIAGNOSIS — H33312 Horseshoe tear of retina without detachment, left eye: Secondary | ICD-10-CM

## 2020-05-23 DIAGNOSIS — H35033 Hypertensive retinopathy, bilateral: Secondary | ICD-10-CM

## 2020-05-23 NOTE — Progress Notes (Signed)
[facility] Note  05/23/2020     CHIEF COMPLAINT Patient presents for Post-op Follow-up   HISTORY OF PRESENT ILLNESS: Todd Weeks is a 64 y.o. male who presents to the clinic today for:  HPI    Post-op Follow-up    In right eye.  Discomfort includes floaters.  Vision is stable.  I, the attending physician,  performed the HPI with the patient and updated documentation appropriately.          Comments    Pt states vision is the same OD.  Patient c/o possible new floater OD as he had fall over the weekend.  Pt states Dog tripped him and he fell down 2 stairs.  Pt states vision seems the same but ? New floater OD.       Last edited by Bernarda Caffey, MD on 05/23/2020  2:32 PM. (History)    Pt states he feels like his eye may be a little better, but he still sees the gas bubble with one satellite bubble, he states his dog tripped him and he fell down 2 stairs  Referring physician: Vivi Barrack, MD [place],  [place] [address]  HISTORICAL INFORMATION:   Selected notes from the [facility]: Current Outpatient Medications (Ophthalmic Drugs)  Medication Sig  . dorzolamide-timolol (COSOPT) 22.3-6.8 MG/ML ophthalmic solution Place 1 drop into the right eye 2 (two) times daily.  Marland Kitchen neomycin-polymyxin b-dexamethasone (MAXITROL) 3.5-10000-0.1 OINT Place 1 application into the right eye 4 (four) times daily.  . prednisoLONE acetate (PRED FORTE) 1 % ophthalmic suspension Place 1 drop into both eyes 4 (four) times daily.   No current facility-administered medications for this visit. (Ophthalmic Drugs)   Current Outpatient Medications (Other)  Medication Sig  . bacitracin 500 UNIT/GM ointment Apply 1 application topically 2 (two) times daily. (Patient not taking: Reported on 05/19/2020)  . clopidogrel (PLAVIX) 75 MG tablet TAKE (1) TABLET BY MOUTH ONCE DAILY.  . famotidine (PEPCID) 20 MG tablet One daily (Patient  taking differently: Take 20 mg by mouth daily. )  . FLUoxetine (PROZAC) 20 MG capsule TAKE 1 CAPSULE BY MOUTH EVERY MORNING  . irbesartan (AVAPRO) 300 MG tablet Take 1 tablet (300 mg total) by mouth daily.  . Multiple Vitamins-Minerals (MULTIVITAMIN WITH MINERALS) tablet Take 1 tablet by mouth daily.  . simvastatin (ZOCOR) 40 MG tablet Take 40 mg by mouth daily.   . tamsulosin (FLOMAX) 0.4 MG CAPS capsule Take 1 capsule (0.4 mg total) by mouth daily.   No current facility-administered medications for this visit. (Other)      REVIEW OF SYSTEMS: ROS    Positive for: Eyes   Negative for: Constitutional, Gastrointestinal, Neurological, Skin, Genitourinary, Musculoskeletal, HENT, Endocrine, Cardiovascular, Respiratory, Psychiatric, Allergic/Imm, Heme/Lymph   Last edited by Doneen Poisson on 05/23/2020  8:10 AM. (History)       ALLERGIES No Known Allergies  PAST MEDICAL HISTORY Past Medical History:  Diagnosis Date  . Bell's palsy   . Cataract    Mixed form OU  . Closed nondisplaced fracture of neck of third metacarpal bone of right hand 02/04/2017  . Clotting disorder ([place])   . Depression   . Diverticulitis   . GERD (gastroesophageal reflux disease)   . Headache(784.0)   . HTN (hypertension)   . Hyperlipidemia   . Hypertensive retinopathy    OU  . Mild CAD 2012  . PVD (peripheral vascular disease) ([place])   .  Sexual dysfunction    Past Surgical History:  Procedure Laterality Date  . ABDOMINAL AORTOGRAM W/LOWER EXTREMITY Bilateral 02/05/2020   Procedure: ABDOMINAL AORTOGRAM W/LOWER EXTREMITY;  Surgeon: Elam Dutch, MD;  Location: [facility];  Service: Cardiovascular;  Laterality: Bilateral;  . EYE SURGERY Right 05/06/2020   Pneumatic retinopexy for rheg. RD repair - Dr. Bernarda Caffey  . PROSTATE BIOPSY  2015  . RETINAL DETACHMENT SURGERY Right 05/06/2020   Pneumatic retinopexy for rheg RD repair - Dr. Bernarda Caffey    FAMILY HISTORY Family History   Problem Relation Age of Onset  . Hypertension Mother   . Deep vein thrombosis Father   . Diabetes Brother   . Tuberculosis Maternal Grandfather   . Emphysema Maternal Grandfather   . Lung disease Brother   . Asthma Son   . Rectal cancer Neg Hx   . Colon cancer Neg Hx     SOCIAL HISTORY Social History   Tobacco Use  . Smoking status: Former Smoker    Packs/day: 1.50    Years: 46.00    Pack years: 69.00    Types: Cigarettes    Quit date: 12/12/2011    Years since quitting: 8.4  . Smokeless tobacco: Never Used  Vaping Use  . Vaping Use: Never used  Substance Use Topics  . Alcohol use: Yes    Alcohol/week: 1.0 standard drink    Types: 1 Cans of beer per week    Comment: a couple of beer on a weekend  . Drug use: Yes    Types: Cocaine, Marijuana, "Crack" cocaine    Comment: november         OPHTHALMIC EXAM:  Base Eye Exam    Visual Acuity (Snellen - Linear)      Right Left   Dist [place] 20/70 +1 20/25 -1   Dist ph [place] 20/70 +2 20/25 +1       Tonometry (Tonopen, 8:13 AM)      Right Left   Pressure 15 Def       Pupils      Dark Light Shape React APD   Right 3 2 Round Brisk 0   Left 3 2 Round Brisk 0       Visual Fields      Left Right    Full Full       Extraocular Movement      Right Left    Full Full       Neuro/Psych    Oriented x3: Yes   Mood/Affect: Normal       Dilation    Right eye: 1.0% Mydriacyl, 2.5% Phenylephrine @ 8:14 AM        Slit Lamp and Fundus Exam    External Exam      Right Left   External periorbital edema Normal       Slit Lamp Exam      Right Left   Lids/Lashes Dermatochalasis - upper lid Normal   Conjunctiva/Sclera Subconjunctival hemorrhage White and quiet   Cornea 1-2+inferior Punctate epithelial erosions trace Punctate epithelial erosions   Anterior Chamber deep, narrow temporal angle Deep and quiet   Iris Round and dilated Round and dilated   Lens 2+ Nuclear sclerosis, 2+ Cortical cataract, focal cortical  wedge at 0600 1+ Nuclear sclerosis, 1+ Cortical cataract   Vitreous Vitreous syneresis, +pigment, Posterior vitreous detachment, vitreous condensations, 35% gas bubble with temporal satellite Vitreous syneresis, Posterior vitreous detachment       Fundus Exam  Right Left   Disc Pink and Sharp Pink and Sharp   C/D Ratio 0.5 0.6   Macula Flat, Blunted foveal reflex, trace residual SRF, ERM with striae Flat, Blunted foveal reflex, mild ERM, No heme or edema   Vessels attenuated, Tortuous attenuated, mild tortuousity   Periphery Temporal detachment improved; +shallow residual SRF inferiorly -- stable, HST at 1030 -- sealed w/ good cryo changes surrounding, VR tuft at 0300 w/ good cryo changes Attached, HST at 0130 no SRF--good laser changes surrounding, mild inferior paving stone degeneration          IMAGING AND PROCEDURES  Imaging and Procedures for 05/23/2020  OCT, Retina - OU - Both Eyes       Right Eye Quality was good. Central Foveal Thickness: 487. Progression has been stable. Findings include subretinal fluid, abnormal foveal contour, epiretinal membrane, no IRF, macular pucker (Persistent shallow SRF inferiorly - caught on widefield; central ERM w/ pucker).   Left Eye Quality was good. Central Foveal Thickness: 287. Progression has been stable. Findings include normal foveal contour, no IRF, no SRF, epiretinal membrane.   Notes *Images captured and stored on drive  Diagnosis / Impression:  OD: persistent shallow SRF inferiorly - caught on widefield; central ERM w/ pucker OS: NFP, no IRF/SRF  Clinical management:  See below  Abbreviations: NFP - Normal foveal profile. CME - cystoid macular edema. PED - pigment epithelial detachment. IRF - intraretinal fluid. SRF - subretinal fluid. EZ - ellipsoid zone. ERM - epiretinal membrane. ORA - outer retinal atrophy. ORT - outer retinal tubulation. SRHM - subretinal hyper-reflective material. IRHM - intraretinal hyper-reflective  material                ASSESSMENT/PLAN:    ICD-10-CM   1. Right retinal detachment  H33.21   2. Retinal edema  H35.81 OCT, Retina - OU - Both Eyes  3. Epiretinal membrane (ERM) of right eye  H35.371   4. Retinal tear of left eye  H33.312   5. Essential hypertension  I10   6. Hypertensive retinopathy of both eyes  H35.033   7. Combined forms of age-related cataract of both eyes  H25.813     1,2. Rhegmatogenous retinal detachment, OD - bullous superior mac off detachment, onset of foveal involvement Thurday, 09.23.21 by pt history - detached temporally from 0730 to 11 oclock, fovea off, horse shoe tear at 1030 - s/p pneumatic retinopexy w/ C3F8 OD 09.24.21,  - supplemental intravitreal C3F8 gas injection OD (2.5cc on 10.06.21) - BCVA 20/70+ - IOP 15 - good gas bubble at 35% with temporal satellite - OCT shows mild persistent, residual SRF inferiorly - recommend surgery -- 25g PPV w/ membrane peel and gas OD - case scheduled for May 26, 2020, [facility] OR 8 at 2:30PM - pre op COVID test scheduled for tomorrow - continue Maxitrol ung QID OD - cont Cosopt BID OD - cont PF QID OD - discussed importance of positioning -- head tilt chin down positioning  - f/u Wednesday 815 am for POV and pre op  3. Epiretinal membrane, right eye  - +ERM w/ pucker likely impacting RD and SRF OD - BCVA 20/70 - scheduled for PPV w/ peel  4. Retinal tear, OS - horse shoe tear located at 0130 - s/p laser retinopexy OS 09.24.21 - good laser changes in place  5,6. Hypertensive retinopathy OU - discussed importance of tight BP control - monitor  7. Mixed Cataract OU - The symptoms of cataract, surgical options, and treatments  and risks were discussed with patient. - discussed diagnosis and progression - not yet visually significant - monitor for now    Ophthalmic Meds Ordered this visit:  No orders of the defined types were placed in this encounter.      Return in about 2 days  (around 05/25/2020) for f/u 2 days, 8:15am, RD OD, DFE, OCT.  There are no Patient Instructions on file for this visit.  This document serves as a record of services personally performed by Gardiner Sleeper, MD, PhD. It was created on their behalf by San Jetty. Owens Shark, OA an ophthalmic technician. The creation of this record is the provider's dictation and/or activities during the visit.    Electronically signed by: San Jetty. [place], [place] 10.11.2021 2:35 PM  Gardiner Sleeper, M.D., Ph.D. Diseases & Surgery of the Retina and Vitreous [facility]  I have reviewed the above documentation for accuracy and completeness, and I agree with the above. Gardiner Sleeper, M.D., Ph.D. 05/23/20 2:35 PM  Abbreviations: M myopia (nearsighted); A astigmatism; H hyperopia (farsighted); P presbyopia; Mrx spectacle prescription;  CTL contact lenses; OD right eye; OS left eye; OU both eyes  XT exotropia; ET esotropia; PEK punctate epithelial keratitis; PEE punctate epithelial erosions; DES dry eye syndrome; MGD meibomian gland dysfunction; ATs artificial tears; PFAT's preservative free artificial tears; [place] nuclear sclerotic cataract; PSC posterior subcapsular cataract; ERM epi-retinal membrane; PVD posterior vitreous detachment; RD retinal detachment; DM diabetes mellitus; DR diabetic retinopathy; NPDR non-proliferative diabetic retinopathy; PDR proliferative diabetic retinopathy; CSME clinically significant macular edema; DME diabetic macular edema; dbh dot blot hemorrhages; CWS cotton wool spot; POAG primary open angle glaucoma; C/D cup-to-disc ratio; HVF humphrey visual field; GVF goldmann visual field; OCT optical coherence tomography; IOP intraocular pressure; BRVO Branch retinal vein occlusion; CRVO central retinal vein occlusion; CRAO central retinal artery occlusion; BRAO branch retinal artery occlusion; RT retinal tear; SB scleral buckle; PPV pars plana vitrectomy; VH Vitreous hemorrhage; PRP  panretinal laser photocoagulation; IVK intravitreal kenalog; VMT vitreomacular traction; MH Macular hole;  NVD neovascularization of the disc; NVE neovascularization elsewhere; AREDS age related eye disease study; ARMD age related macular degeneration; POAG primary open angle glaucoma; EBMD epithelial/anterior basement membrane dystrophy; ACIOL anterior chamber intraocular lens; IOL intraocular lens; PCIOL posterior chamber intraocular lens; Phaco/IOL phacoemulsification with intraocular lens placement; [place] photorefractive keratectomy; LASIK laser assisted in situ keratomileusis; HTN hypertension; DM diabetes mellitus; COPD chronic obstructive pulmonary disease

## 2020-05-23 NOTE — Progress Notes (Signed)
Patient tested positive for covid on 03/25/20 - will not need to be tested prior to procedure on 05/26/20

## 2020-05-24 ENCOUNTER — Encounter (HOSPITAL_COMMUNITY): Payer: Self-pay | Admitting: Ophthalmology

## 2020-05-24 ENCOUNTER — Other Ambulatory Visit (HOSPITAL_COMMUNITY): Payer: PPO

## 2020-05-24 ENCOUNTER — Other Ambulatory Visit: Payer: Self-pay

## 2020-05-24 DIAGNOSIS — H33001 Unspecified retinal detachment with retinal break, right eye: Secondary | ICD-10-CM

## 2020-05-24 NOTE — Progress Notes (Signed)
[facility] Note  05/25/2020     CHIEF COMPLAINT Patient presents for Retina Follow Up   HISTORY OF PRESENT ILLNESS: Todd Weeks is a 64 y.o. male who presents to the clinic today for:  HPI    Retina Follow Up    Patient presents with  Other.  In right eye.  This started months ago.  Severity is moderate.  Duration of 2 days.  Since onset it is stable.  I, the attending physician,  performed the HPI with the patient and updated documentation appropriately.          Comments    64 y/o male pt here for 2 day f/u.  S/p pneumatic retinopexy OD 9.24.21.  S/p supplemental intravitreal C3F8 gas injection OD (2.5cc) 10.6.21.  Scheduled for 25g PPV w/membrane peel and gas OD tomorrow, 10.14.21.  No change in [place] OU.  States one of the gas bubbles OD is now gone.  Maxitrol ung QID OD, Cosopt BID OD, PF QID OD.       Last edited by Bernarda Caffey, MD on 05/25/2020  9:48 AM. (History)    Pt states one of the satellite bubbles is gone  Referring physician: Vivi Barrack, MD [place],  [place] [id number]  HISTORICAL INFORMATION:   Selected notes from the MEDICAL RECORD NUMBER    CURRENT MEDICATIONS: Current Outpatient Medications (Ophthalmic Drugs)  Medication Sig  . dorzolamide-timolol (COSOPT) 22.3-6.8 MG/ML ophthalmic solution Place 1 drop into the right eye 2 (two) times daily.  Marland Kitchen neomycin-polymyxin b-dexamethasone (MAXITROL) 3.5-10000-0.1 OINT Place 1 application into the right eye 4 (four) times daily.  . prednisoLONE acetate (PRED FORTE) 1 % ophthalmic suspension Place 1 drop into both eyes 4 (four) times daily.   No current facility-administered medications for this visit. (Ophthalmic Drugs)   Current Outpatient Medications (Other)  Medication Sig  . bacitracin 500 UNIT/GM ointment Apply 1 application topically 2 (two) times daily. (Patient not taking: Reported on 05/19/2020)  . clopidogrel (PLAVIX) 75 MG tablet TAKE (1) TABLET BY MOUTH  ONCE DAILY.  . famotidine (PEPCID) 20 MG tablet One daily (Patient taking differently: Take 20 mg by mouth daily. )  . FLUoxetine (PROZAC) 20 MG capsule TAKE 1 CAPSULE BY MOUTH EVERY MORNING  . irbesartan (AVAPRO) 300 MG tablet Take 1 tablet (300 mg total) by mouth daily.  . Multiple Vitamins-Minerals (MULTIVITAMIN WITH MINERALS) tablet Take 1 tablet by mouth daily.  . simvastatin (ZOCOR) 40 MG tablet Take 40 mg by mouth daily.   . tamsulosin (FLOMAX) 0.4 MG CAPS capsule Take 1 capsule (0.4 mg total) by mouth daily.   No current facility-administered medications for this visit. (Other)      REVIEW OF SYSTEMS: ROS    Positive for: Gastrointestinal, Cardiovascular, Eyes   Negative for: Constitutional, Neurological, Skin, Genitourinary, Musculoskeletal, HENT, Endocrine, Respiratory, Psychiatric, Allergic/Imm, Heme/Lymph   Last edited by Matthew Folks, COA on 05/25/2020  8:35 AM. (History)       ALLERGIES No Known Allergies  PAST MEDICAL HISTORY Past Medical History:  Diagnosis Date  . Bell's palsy   . Cataract    Mixed form OU  . Closed nondisplaced fracture of neck of third metacarpal bone of right hand 02/04/2017  . Clotting disorder ([place])   . Depression   . Diverticulitis   . GERD (gastroesophageal reflux disease)   . Headache(784.0)   . HTN (hypertension)   . Hyperlipidemia   . Hypertensive retinopathy  OU  . Mild CAD 2012  . PVD (peripheral vascular disease) ([place])   . Sexual dysfunction    Past Surgical History:  Procedure Laterality Date  . ABDOMINAL AORTOGRAM W/LOWER EXTREMITY Bilateral 02/05/2020   Procedure: ABDOMINAL AORTOGRAM W/LOWER EXTREMITY;  Surgeon: Elam Dutch, MD;  Location: [facility];  Service: Cardiovascular;  Laterality: Bilateral;  . EYE SURGERY Right 05/06/2020   Pneumatic retinopexy for rheg. RD repair - Dr. Bernarda Caffey  . PROSTATE BIOPSY  2015  . RETINAL DETACHMENT SURGERY Right 05/06/2020   Pneumatic retinopexy for rheg RD  repair - Dr. Bernarda Caffey    FAMILY HISTORY Family History  Problem Relation Age of Onset  . Hypertension Mother   . Deep vein thrombosis Father   . Diabetes Brother   . Tuberculosis Maternal Grandfather   . Emphysema Maternal Grandfather   . Lung disease Brother   . Asthma Son   . Rectal cancer Neg Hx   . Colon cancer Neg Hx     SOCIAL HISTORY Social History   Tobacco Use  . Smoking status: Former Smoker    Packs/day: 1.50    Years: 46.00    Pack years: 69.00    Types: Cigarettes    Quit date: 12/12/2011    Years since quitting: 8.4  . Smokeless tobacco: Never Used  Vaping Use  . Vaping Use: Never used  Substance Use Topics  . Alcohol use: Yes    Alcohol/week: 4.0 standard drinks    Types: 4 Cans of beer per week    Comment: a couple of beer on a weekend  . Drug use: Yes    Types: Cocaine, Marijuana, "Crack" cocaine    Comment: november         OPHTHALMIC EXAM:  Base Eye Exam    Visual Acuity (Snellen - Linear)      Right Left   Dist [place] 20/70 20/30 -2   Dist ph [place] NI 20/25 -2       Tonometry (Tonopen, 8:39 AM)      Right Left   Pressure 12 17       Pupils      Dark Light Shape React APD   Right 3 2 Round Brisk None   Left 3 2 Round Brisk None       Visual Fields (Counting fingers)      Left Right    Full Full       Extraocular Movement      Right Left    Full, Ortho Full, Ortho       Neuro/Psych    Oriented x3: Yes   Mood/Affect: Normal       Dilation    Both eyes: 1.0% Mydriacyl, 2.5% Phenylephrine @ 8:39 AM        Slit Lamp and Fundus Exam    External Exam      Right Left   External periorbital edema Normal       Slit Lamp Exam      Right Left   Lids/Lashes Dermatochalasis - upper lid Normal   Conjunctiva/Sclera Subconjunctival hemorrhage White and quiet   Cornea 1-2+inferior Punctate epithelial erosions trace Punctate epithelial erosions   Anterior Chamber deep, narrow temporal angle Deep and quiet   Iris Round and  dilated Round and dilated   Lens 2+ Nuclear sclerosis, 2+ Cortical cataract, focal cortical wedge at 0600 1+ Nuclear sclerosis, 1+ Cortical cataract   Vitreous Vitreous syneresis, +pigment, Posterior vitreous detachment, vitreous condensations, 35% gas bubble  Vitreous syneresis, Posterior vitreous detachment       Fundus Exam      Right Left   Disc Pink and Sharp Pink and Sharp   C/D Ratio 0.5 0.6   Macula Flat, Blunted foveal reflex, trace residual SRF, ERM with striae Flat, Blunted foveal reflex, mild ERM, No heme or edema   Vessels attenuated, Tortuous attenuated, mild tortuousity   Periphery Temporal detachment improved; +shallow residual SRF inferiorly -- stable, HST at 1030 -- sealed w/ good cryo changes surrounding, VR tuft at 0300 w/ good cryo changes Attached, HST at 0130 no SRF--good laser changes surrounding, mild inferior paving stone degeneration          IMAGING AND PROCEDURES  Imaging and Procedures for 05/25/2020  OCT, Retina - OU - Both Eyes       Right Eye Quality was good. Central Foveal Thickness: 484. Progression has improved. Findings include subretinal fluid, abnormal foveal contour, epiretinal membrane, no IRF, macular pucker (Persistent shallow SRF inferiorly (slightly improved) - caught on widefield; central ERM w/ pucker).   Left Eye Quality was good. Central Foveal Thickness: 287. Progression has been stable. Findings include normal foveal contour, no IRF, no SRF, epiretinal membrane.   Notes *Images captured and stored on drive  Diagnosis / Impression:  OD: persistent shallow SRF inferiorly (slightly improved) - caught on widefield; central ERM w/ pucker OS: NFP, no IRF/SRF  Clinical management:  See below  Abbreviations: NFP - Normal foveal profile. CME - cystoid macular edema. PED - pigment epithelial detachment. IRF - intraretinal fluid. SRF - subretinal fluid. EZ - ellipsoid zone. ERM - epiretinal membrane. ORA - outer retinal atrophy. ORT -  outer retinal tubulation. SRHM - subretinal hyper-reflective material. IRHM - intraretinal hyper-reflective material                ASSESSMENT/PLAN:    ICD-10-CM   1. Right retinal detachment  H33.21   2. Retinal edema  H35.81 OCT, Retina - OU - Both Eyes  3. Epiretinal membrane (ERM) of right eye  H35.371   4. Retinal tear of left eye  H33.312   5. Essential hypertension  I10   6. Hypertensive retinopathy of both eyes  H35.033   7. Combined forms of age-related cataract of both eyes  H25.813     1,2. Rhegmatogenous retinal detachment, OD - bullous superior mac off detachment, onset of foveal involvement Thurday, 09.23.21 by pt history - detached temporally from 0730 to 11 oclock, fovea off, horse shoe tear at 1030 - s/p pneumatic retinopexy w/ C3F8 OD 09.24.21,  - supplemental intravitreal C3F8 gas injection OD (2.5cc on 10.06.21) - BCVA 20/70 - IOP 12 - good gas bubble at 35% - OCT shows mild persistent, residual SRF inferiorly -- slightly improved - recommend surgery -- 25g PPV w/ membrane peel and gas OD under general anesthesia - case scheduled for May 26, 2020, [facility] OR 8 at 2:30PM - continue Maxitrol ung QID OD - cont Cosopt BID OD - cont PF QID OD  - f/u Friday, October 15 815 am for POV   3. Epiretinal membrane, right eye  - +ERM w/ pucker likely impacting RD and SRF OD - BCVA 20/70 - scheduled for PPV with peel OD as above  4. Retinal tear, OS - horse shoe tear located at 0130 - s/p laser retinopexy OS 09.24.21 - good laser changes in place  5,6. Hypertensive retinopathy OU - discussed importance of tight BP control - monitor  7. Mixed Cataract OU -  The symptoms of cataract, surgical options, and treatments and risks were discussed with patient. - discussed diagnosis and progression - not yet visually significant - monitor for now  Ophthalmic Meds Ordered this visit:  No orders of the defined types were placed in this encounter.     Return for  f/u 2 days, RD OD POV.  There are no Patient Instructions on file for this visit.  This document serves as a record of services personally performed by Gardiner Sleeper, MD, PhD. It was created on their behalf by Roselee Nova, COMT. The creation of this record is the provider's dictation and/or activities during the visit.  Electronically signed by: Roselee Nova, COMT 05/25/20 9:54 AM  This document serves as a record of services personally performed by Gardiner Sleeper, MD, PhD. It was created on their behalf by San Jetty. Owens Shark, OA an ophthalmic technician. The creation of this record is the provider's dictation and/or activities during the visit.    Electronically signed by: San Jetty. Owens Shark, [place] 10.13.2021 9:54 AM   Gardiner Sleeper, M.D., Ph.D. Diseases & Surgery of the Retina and Vitreous [facility]  I have reviewed the above documentation for accuracy and completeness, and I agree with the above. Gardiner Sleeper, M.D., Ph.D. 05/25/20 9:54 AM   Abbreviations: M myopia (nearsighted); A astigmatism; H hyperopia (farsighted); P presbyopia; Mrx spectacle prescription;  CTL contact lenses; OD right eye; OS left eye; OU both eyes  XT exotropia; ET esotropia; PEK punctate epithelial keratitis; PEE punctate epithelial erosions; DES dry eye syndrome; MGD meibomian gland dysfunction; ATs artificial tears; PFAT's preservative free artificial tears; [place] nuclear sclerotic cataract; PSC posterior subcapsular cataract; ERM epi-retinal membrane; PVD posterior vitreous detachment; RD retinal detachment; DM diabetes mellitus; DR diabetic retinopathy; NPDR non-proliferative diabetic retinopathy; PDR proliferative diabetic retinopathy; CSME clinically significant macular edema; DME diabetic macular edema; dbh dot blot hemorrhages; CWS cotton wool spot; POAG primary open angle glaucoma; C/D cup-to-disc ratio; HVF humphrey visual field; GVF goldmann visual field; OCT optical coherence  tomography; IOP intraocular pressure; BRVO Branch retinal vein occlusion; CRVO central retinal vein occlusion; CRAO central retinal artery occlusion; BRAO branch retinal artery occlusion; RT retinal tear; SB scleral buckle; PPV pars plana vitrectomy; VH Vitreous hemorrhage; PRP panretinal laser photocoagulation; IVK intravitreal kenalog; VMT vitreomacular traction; MH Macular hole;  NVD neovascularization of the disc; NVE neovascularization elsewhere; AREDS age related eye disease study; ARMD age related macular degeneration; POAG primary open angle glaucoma; EBMD epithelial/anterior basement membrane dystrophy; ACIOL anterior chamber intraocular lens; IOL intraocular lens; PCIOL posterior chamber intraocular lens; Phaco/IOL phacoemulsification with intraocular lens placement; [place] photorefractive keratectomy; LASIK laser assisted in situ keratomileusis; HTN hypertension; DM diabetes mellitus; COPD chronic obstructive pulmonary disease

## 2020-05-24 NOTE — Progress Notes (Signed)
PCP - Dr. Jerline Pain  Cardiologist - Dr. Angelena Form   Chest x-ray - n/a EKG - 10/13/19 Stress Test - 05/12/18 ECHO - 04/26/18 Cardiac Cath - pt denies   (pt does have peripheral vascular cath in his lower extremity - per pt and see med hx)   Blood Thinner Instructions: Follow your surgeon's instructions on when to stop clopidogrel (PLAVIX) .  If no instructions were given by your surgeon then you will need to call the office to get those instructions.   Per pt and surgeon's orders - ok to continue plavix until DOS - do not take morning of surgery.  Aspirin Instructions: n/a   COVID TEST- Pt not tested for covid today due to pt testing + for covid on 03/25/20. Based on the guidelines the pt is in the 90 day window to not retest. The pt is still expected to quarantine until their procedure. Therefore, the pt can still have the scheduled procedure. These are the guidelines as follows:  Guidance: Patient previously tested + COVID; now past 90 day window seeking elective surgery (asymptomatic)  Retest patient If negative, proceed with surgery If positive, postpone surgery for 10 days from positive test Patient to quarantine for the (10 days) Do not retest again prior to surgery (even if scheduled a couple of weeks out) Use standard precautions for surgery     Anesthesia review: n/a   -------------  SDW INSTRUCTIONS:  Your procedure is scheduled on Thursday 05/26/20 .  Report to [facility] Main Entrance "A" at 12:20 P.M., and check in at the Admitting office.  Call this number if you have problems the morning of surgery: [phone]   Remember: Do not eat or drink after midnight the night before your surgery  Take these medicines the morning of surgery with A SIP OF WATER: dorzolamide-timolol (COSOPT) eye drops famotidine (PEPCID) FLUoxetine (PROZAC)  neomycin-polymyxin b-dexamethasone (MAXITROL) eye drops simvastatin (ZOCOR)  Follow your surgeon's instructions on when to stop  clopidogrel (PLAVIX) .  If no instructions were given by your surgeon then you will need to call the office to get those instructions.    As of today, STOP taking any Aspirin (unless otherwise instructed by your surgeon), Aleve, Naproxen, Ibuprofen, Motrin, Advil, Goody's, BC's, all herbal medications, fish oil, and all vitamins.    The Morning of Surgery  Do not wear jewelry  Do not wear lotions, powders, colognes, or deodorant   Men may shave face and neck.  Do not bring valuables to the hospital.  [facility] is not responsible for any belongings or valuables.  If you are a smoker, DO NOT Smoke 24 hours prior to surgery  If you wear a CPAP at night please bring your mask the morning of surgery   Remember that you must have someone to transport you home after your surgery, and remain with you for 24 hours if you are discharged the same day.   Please bring cases for contacts, glasses, hearing aids, dentures or bridgework because it cannot be worn into surgery.    Leave your suitcase in the car.  After surgery it may be brought to your room.  For patients admitted to the hospital, discharge time will be determined by your treatment team.  Patients discharged the day of surgery will not be allowed to drive home.    Special instructions:   [place]- Preparing For Surgery  Oral Hygiene is also important to reduce your risk of infection.  Remember - BRUSH YOUR TEETH THE  MORNING OF SURGERY WITH YOUR REGULAR TOOTHPASTE  Please follow these instructions carefully.   1. Shower the NIGHT BEFORE SURGERY and the MORNING OF SURGERY with DIAL Soap.   2. Wash thoroughly, paying special attention to the area where your surgery will be performed.  3. Thoroughly rinse your body with warm water from the neck down.  4. Pat yourself dry with a CLEAN TOWEL.  5. Wear CLEAN PAJAMAS to bed the night before surgery  6. Place CLEAN SHEETS on your bed the night of your first shower and DO NOT  SLEEP WITH PETS.  7. Wear comfortable clothes the morning of surgery.    Day of Surgery:  Please shower the morning of surgery with the CHG soap Do not apply any deodorants/lotions. Please wear clean clothes to the hospital/surgery center.   Remember to brush your teeth WITH YOUR REGULAR TOOTHPASTE.   Please read over the following fact sheets that you were given.  Patient denies shortness of breath, fever, cough and chest pain.

## 2020-05-24 NOTE — H&P (Signed)
Todd Weeks is an 64 y.o. male.    Chief Complaint: decreased vision OD  HPI: Pt with history of retinal detachment OD s/p pneumatic cryopexy OD on 9.24.21. The retina partially reattached, but shallow residual subretinal fluid has remained due to an overlying epiretinal membrane. We have recommended 25g PPV w/ membrane peel and RD repair with gas OD under general anesthesia to definitively repair the RD and remove the ERM. After a discussion of the risks, benefits and alternative treatment options, the patient has elected to proceed with surgical repair of his retinal detachment and removal of the ERM.  Past Medical History:  Diagnosis Date  . Bell's palsy   . Cataract    Mixed form OU  . Closed nondisplaced fracture of neck of third metacarpal bone of right hand 02/04/2017  . Clotting disorder ([place])   . Depression   . Diverticulitis   . GERD (gastroesophageal reflux disease)   . Headache(784.0)   . HTN (hypertension)   . Hyperlipidemia   . Hypertensive retinopathy    OU  . Mild CAD 2012  . PVD (peripheral vascular disease) ([place])   . Sexual dysfunction     Past Surgical History:  Procedure Laterality Date  . ABDOMINAL AORTOGRAM W/LOWER EXTREMITY Bilateral 02/05/2020   Procedure: ABDOMINAL AORTOGRAM W/LOWER EXTREMITY;  Surgeon: Elam Dutch, MD;  Location: [facility];  Service: Cardiovascular;  Laterality: Bilateral;  . EYE SURGERY Right 05/06/2020   Pneumatic retinopexy for rheg. RD repair - Dr. Bernarda Caffey  . PROSTATE BIOPSY  2015  . RETINAL DETACHMENT SURGERY Right 05/06/2020   Pneumatic retinopexy for rheg RD repair - Dr. Bernarda Caffey    Family History  Problem Relation Age of Onset  . Hypertension Mother   . Deep vein thrombosis Father   . Diabetes Brother   . Tuberculosis Maternal Grandfather   . Emphysema Maternal Grandfather   . Lung disease Brother   . Asthma Son   . Rectal cancer Neg Hx   . Colon cancer Neg Hx    Social History:  reports that he  quit smoking about 8 years ago. His smoking use included cigarettes. He has a 69.00 pack-year smoking history. He has never used smokeless tobacco. He reports current alcohol use of about 1.0 standard drink of alcohol per week. He reports current drug use. Drugs: Cocaine, Marijuana, and "Crack" cocaine.  Allergies: No Known Allergies  No medications prior to admission.    Review of systems otherwise negative  There were no vitals taken for this visit.  Physical exam: Mental status: oriented x3. Eyes: See eye exam associated with this date of surgery Ears, Nose, Throat: within normal limits Neck: Within Normal limits General: within normal limits Chest: Within normal limits Breast: deferred Heart: Within normal limits Abdomen: Within normal limits GU: deferred Extremities: within normal limits Skin: within normal limits  Assessment/Plan 1. Retinal detachment with epiretinal membrane, RIGHT EYE   Plan: To [facility] for 25g PPV w/ membrane peel, endolaser, and gas, OD, under general anesthesia - case scheduled for Thursday, 10.14.21, [facility] OR 08  Gardiner Sleeper, M.D., Ph.D. Vitreoretinal Surgeon [facility]

## 2020-05-25 ENCOUNTER — Ambulatory Visit (INDEPENDENT_AMBULATORY_CARE_PROVIDER_SITE_OTHER): Payer: PPO | Admitting: Ophthalmology

## 2020-05-25 ENCOUNTER — Encounter (INDEPENDENT_AMBULATORY_CARE_PROVIDER_SITE_OTHER): Payer: Self-pay | Admitting: Ophthalmology

## 2020-05-25 ENCOUNTER — Other Ambulatory Visit: Payer: Self-pay

## 2020-05-25 DIAGNOSIS — H3321 Serous retinal detachment, right eye: Secondary | ICD-10-CM

## 2020-05-25 DIAGNOSIS — H3581 Retinal edema: Secondary | ICD-10-CM | POA: Diagnosis not present

## 2020-05-25 DIAGNOSIS — H33312 Horseshoe tear of retina without detachment, left eye: Secondary | ICD-10-CM

## 2020-05-25 DIAGNOSIS — I10 Essential (primary) hypertension: Secondary | ICD-10-CM

## 2020-05-25 DIAGNOSIS — H35033 Hypertensive retinopathy, bilateral: Secondary | ICD-10-CM

## 2020-05-25 DIAGNOSIS — H25813 Combined forms of age-related cataract, bilateral: Secondary | ICD-10-CM

## 2020-05-25 DIAGNOSIS — H35371 Puckering of macula, right eye: Secondary | ICD-10-CM

## 2020-05-25 NOTE — Progress Notes (Signed)
[facility] Note  05/27/2020     CHIEF COMPLAINT Patient presents for Post-op Follow-up   HISTORY OF PRESENT ILLNESS: Todd Weeks is a 64 y.o. male who presents to the clinic today for:  HPI    Post-op Follow-up    In right eye.  Vision is stable.  I, the attending physician,  performed the HPI with the patient and updated documentation appropriately.          Comments    Pt denies eye pain OD.  Patient states vision is difficult to tell.  Patient denies any new or worsening floaters or fol OU.       Last edited by Bernarda Caffey, MD on 05/27/2020  8:33 AM. (History)    Pt states he didn't sleep much last night  Referring physician: Vivi Barrack, MD [place],  [place] [address]  HISTORICAL INFORMATION:   Selected notes from the MEDICAL RECORD NUMBER    CURRENT MEDICATIONS: Current Outpatient Medications (Ophthalmic Drugs)  Medication Sig  . dorzolamide-timolol (COSOPT) 22.3-6.8 MG/ML ophthalmic solution Place 1 drop into the right eye 2 (two) times daily.  Marland Kitchen neomycin-polymyxin b-dexamethasone (MAXITROL) 3.5-10000-0.1 OINT Place 1 application into the right eye 4 (four) times daily.  . prednisoLONE acetate (PRED FORTE) 1 % ophthalmic suspension Place 1 drop into both eyes 4 (four) times daily.   No current facility-administered medications for this visit. (Ophthalmic Drugs)   Current Outpatient Medications (Other)  Medication Sig  . bacitracin 500 UNIT/GM ointment Apply 1 application topically 2 (two) times daily. (Patient not taking: Reported on 05/19/2020)  . clopidogrel (PLAVIX) 75 MG tablet TAKE (1) TABLET BY MOUTH ONCE DAILY.  . famotidine (PEPCID) 20 MG tablet One daily (Patient taking differently: Take 20 mg by mouth daily. )  . FLUoxetine (PROZAC) 20 MG capsule TAKE 1 CAPSULE BY MOUTH EVERY MORNING  . irbesartan (AVAPRO) 300 MG tablet Take 1 tablet (300 mg total) by mouth daily.  . Multiple Vitamins-Minerals  (MULTIVITAMIN WITH MINERALS) tablet Take 1 tablet by mouth daily.  . simvastatin (ZOCOR) 40 MG tablet Take 40 mg by mouth daily.   . tamsulosin (FLOMAX) 0.4 MG CAPS capsule Take 1 capsule (0.4 mg total) by mouth daily.   No current facility-administered medications for this visit. (Other)      REVIEW OF SYSTEMS: ROS    Positive for: Gastrointestinal, Cardiovascular, Eyes   Negative for: Constitutional, Neurological, Skin, Genitourinary, Musculoskeletal, HENT, Endocrine, Respiratory, Psychiatric, Allergic/Imm, Heme/Lymph   Last edited by Doneen Poisson on 05/27/2020  8:21 AM. (History)       ALLERGIES No Known Allergies  PAST MEDICAL HISTORY Past Medical History:  Diagnosis Date  . Bell's palsy   . Cataract    Mixed form OU  . Closed nondisplaced fracture of neck of third metacarpal bone of right hand 02/04/2017  . Clotting disorder ([place])   . Depression   . Diverticulitis   . GERD (gastroesophageal reflux disease)   . Headache(784.0)   . HTN (hypertension)   . Hyperlipidemia   . Hypertensive retinopathy    OU  . Mild CAD 2012  . PVD (peripheral vascular disease) ([place])   . Sexual dysfunction    Past Surgical History:  Procedure Laterality Date  . ABDOMINAL AORTOGRAM W/LOWER EXTREMITY Bilateral 02/05/2020   Procedure: ABDOMINAL AORTOGRAM W/LOWER EXTREMITY;  Surgeon: Elam Dutch, MD;  Location: [facility];  Service: Cardiovascular;  Laterality: Bilateral;  . EYE SURGERY Right 05/06/2020  Pneumatic retinopexy for rheg. RD repair - Dr. Bernarda Caffey  . GAS INSERTION Right 05/26/2020   Procedure: INSERTION OF GAS;  Surgeon: Bernarda Caffey, MD;  Location: [facility];  Service: Ophthalmology;  Laterality: Right;  . MEMBRANE PEEL Right 05/26/2020   Procedure: MEMBRANE PEEL;  Surgeon: Bernarda Caffey, MD;  Location: [facility];  Service: Ophthalmology;  Laterality: Right;  . PARS PLANA VITRECTOMY Right 05/26/2020   Procedure: PARS PLANA VITRECTOMY WITH 25 GAUGE;  Surgeon:  Bernarda Caffey, MD;  Location: [facility];  Service: Ophthalmology;  Laterality: Right;  . PHOTOCOAGULATION Right 05/26/2020   Procedure: PHOTOCOAGULATION;  Surgeon: Bernarda Caffey, MD;  Location: [facility];  Service: Ophthalmology;  Laterality: Right;  . PROSTATE BIOPSY  2015  . RETINAL DETACHMENT SURGERY Right 05/06/2020   Pneumatic retinopexy for rheg RD repair - Dr. Bernarda Caffey    FAMILY HISTORY Family History  Problem Relation Age of Onset  . Hypertension Mother   . Deep vein thrombosis Father   . Diabetes Brother   . Tuberculosis Maternal Grandfather   . Emphysema Maternal Grandfather   . Lung disease Brother   . Asthma Son   . Rectal cancer Neg Hx   . Colon cancer Neg Hx     SOCIAL HISTORY Social History   Tobacco Use  . Smoking status: Former Smoker    Packs/day: 1.50    Years: 46.00    Pack years: 69.00    Types: Cigarettes    Quit date: 12/12/2011    Years since quitting: 8.4  . Smokeless tobacco: Never Used  Vaping Use  . Vaping Use: Never used  Substance Use Topics  . Alcohol use: Yes    Alcohol/week: 4.0 standard drinks    Types: 4 Cans of beer per week    Comment: a couple of beer on a weekend  . Drug use: Yes    Types: Cocaine, Marijuana, "Crack" cocaine    Comment: over 25 years ago         OPHTHALMIC EXAM:  Base Eye Exam    Visual Acuity (Snellen - Linear)      Right Left   Dist [place] HM 20/25 -2       Tonometry (Tonopen, 8:21 AM)      Right Left   Pressure 20 def       Pupils      Dark   Right Dilated   Left        Extraocular Movement      Right Left    Full Full       Neuro/Psych    Oriented x3: Yes   Mood/Affect: Normal       Dilation    Right eye: 1.0% Mydriacyl, 2.5% Phenylephrine @ 8:22 AM        Slit Lamp and Fundus Exam    External Exam      Right Left   External periorbital edema Normal       Slit Lamp Exam      Right Left   Lids/Lashes Dermatochalasis - upper lid Normal   Conjunctiva/Sclera Subconjunctival  hemorrhage 360, sutures intact White and quiet   Cornea Trace Punctate epithelial erosions, mild endo heme inferiorly trace Punctate epithelial erosions   Anterior Chamber deep, narrow temporal angle, 1+ Cell/pigment Deep and quiet   Iris Round and dilated Round and dilated   Lens 2+ Nuclear sclerosis, 2+ Cortical cataract, focal cortical wedge at 0600 1+ Nuclear sclerosis, 1+ Cortical cataract   Vitreous post vitrectomy, good gas  fill Vitreous syneresis, Posterior vitreous detachment       Fundus Exam      Right Left   Disc Pink and Sharp Pink and Sharp   C/D Ratio 0.5 0.6   Macula flat under gas, ERM gone Flat, Blunted foveal reflex, mild ERM, No heme or edema   Vessels attenuated, Tortuous attenuated, mild tortuousity   Periphery Attached, good cryo ST quad, good 360 laser changes, ORIGINALLY: Temporal mac off detachment w/ HST at 1030 Attached, HST at 0130 no SRF--good laser changes surrounding, mild inferior paving stone degeneration        Refraction    Wearing Rx      Sphere Cylinder Axis Add   Right +1.00 +1.50 178 +2.00   Left +0.50 +1.25 166 +2.00          IMAGING AND PROCEDURES  Imaging and Procedures for 05/27/2020          ASSESSMENT/PLAN:    ICD-10-CM   1. Right retinal detachment  H33.21   2. Retinal edema  H35.81   3. Epiretinal membrane (ERM) of right eye  H35.371   4. Retinal tear of left eye  H33.312   5. Essential hypertension  I10   6. Hypertensive retinopathy of both eyes  H35.033   7. Combined forms of age-related cataract of both eyes  H25.813     1,2. Rhegmatogenous retinal detachment, OD - bullous superior mac off detachment, onset of foveal involvement Thurday, 09.23.21 by pt history - detached temporally from 0730 to 11 oclock, fovea off, horse shoe tear at 1030 - s/p pneumatic retinopexy w/ C3F8 OD 09.24.21,  - supplemental intravitreal C3F8 gas injection OD (2.5cc on 10.06.21) **persistent shallow SRF remained due to ERM preventing  full reattachment**  - now POD1 s/p PPV/TissueBlue stain/MP/EL/FAX/14% C3F8 OD, 10.14.2021             - doing well this morning             - retina attached and ERM gone  - BCVA HM             - IOP okay at 20             - start   PF 6x/day OD                          zymaxid QID OD                          Atropine BID OD                          Brimonidine BID OD                          PSO ung QID OD              - cont face down positioning x3 days; avoid laying flat on back              - eye shield when sleeping              - post op drop and positioning instructions reviewed              - tylenol/ibuprofen for pain  - f/u Thursday 815am, POW1   3. Epiretinal membrane, right eye  - +ERM w/ pucker likely impacting RD  and SRF OD  - now POD1 s/p PPV/TissueBlue stain/MP/EL/FAX/14% C3F8 OD, 10.14.2021 as above - ERM gone - drops as above  4. Retinal tear, OS - horse shoe tear located at 0130 - s/p laser retinopexy OS 09.24.21 - good laser changes in place  5,6. Hypertensive retinopathy OU - discussed importance of tight BP control - monitor  7. Mixed Cataract OU - The symptoms of cataract, surgical options, and treatments and risks were discussed with patient. - discussed diagnosis and progression   Ophthalmic Meds Ordered this visit:  No orders of the defined types were placed in this encounter.     Return in about 6 days (around 06/02/2020) for POV.  There are no Patient Instructions on file for this visit.  This document serves as a record of services personally performed by Gardiner Sleeper, MD, PhD. It was created on their behalf by San Jetty. Owens Shark, OA an ophthalmic technician. The creation of this record is the provider's dictation and/or activities during the visit.    Electronically signed by: San Jetty. [place], [place] 10.13.2021 1:16 PM   Gardiner Sleeper, M.D., Ph.D. Diseases & Surgery of the Retina and Vitreous [facility]  I  have reviewed the above documentation for accuracy and completeness, and I agree with the above. Gardiner Sleeper, M.D., Ph.D. 05/27/20 1:16 PM   Abbreviations: M myopia (nearsighted); A astigmatism; H hyperopia (farsighted); P presbyopia; Mrx spectacle prescription;  CTL contact lenses; OD right eye; OS left eye; OU both eyes  XT exotropia; ET esotropia; PEK punctate epithelial keratitis; PEE punctate epithelial erosions; DES dry eye syndrome; MGD meibomian gland dysfunction; ATs artificial tears; PFAT's preservative free artificial tears; [place] nuclear sclerotic cataract; PSC posterior subcapsular cataract; ERM epi-retinal membrane; PVD posterior vitreous detachment; RD retinal detachment; DM diabetes mellitus; DR diabetic retinopathy; NPDR non-proliferative diabetic retinopathy; PDR proliferative diabetic retinopathy; CSME clinically significant macular edema; DME diabetic macular edema; dbh dot blot hemorrhages; CWS cotton wool spot; POAG primary open angle glaucoma; C/D cup-to-disc ratio; HVF humphrey visual field; GVF goldmann visual field; OCT optical coherence tomography; IOP intraocular pressure; BRVO Branch retinal vein occlusion; CRVO central retinal vein occlusion; CRAO central retinal artery occlusion; BRAO branch retinal artery occlusion; RT retinal tear; SB scleral buckle; PPV pars plana vitrectomy; VH Vitreous hemorrhage; PRP panretinal laser photocoagulation; IVK intravitreal kenalog; VMT vitreomacular traction; MH Macular hole;  NVD neovascularization of the disc; NVE neovascularization elsewhere; AREDS age related eye disease study; ARMD age related macular degeneration; POAG primary open angle glaucoma; EBMD epithelial/anterior basement membrane dystrophy; ACIOL anterior chamber intraocular lens; IOL intraocular lens; PCIOL posterior chamber intraocular lens; Phaco/IOL phacoemulsification with intraocular lens placement; [place] photorefractive keratectomy; LASIK laser assisted in situ  keratomileusis; HTN hypertension; DM diabetes mellitus; COPD chronic obstructive pulmonary disease

## 2020-05-26 ENCOUNTER — Ambulatory Visit (HOSPITAL_COMMUNITY): Payer: PPO | Admitting: Anesthesiology

## 2020-05-26 ENCOUNTER — Ambulatory Visit (HOSPITAL_COMMUNITY)
Admission: RE | Admit: 2020-05-26 | Discharge: 2020-05-26 | Disposition: A | Discharge status: Home / Self Care | Payer: PPO | Attending: Ophthalmology | Admitting: Ophthalmology

## 2020-05-26 ENCOUNTER — Encounter (HOSPITAL_COMMUNITY): Payer: Self-pay | Admitting: Ophthalmology

## 2020-05-26 ENCOUNTER — Other Ambulatory Visit: Payer: Self-pay

## 2020-05-26 ENCOUNTER — Encounter (HOSPITAL_COMMUNITY)
Admission: RE | Disposition: A | Discharge status: Home / Self Care | Payer: Self-pay | Source: Home / Self Care | Attending: Ophthalmology

## 2020-05-26 DIAGNOSIS — Z87891 Personal history of nicotine dependence: Secondary | ICD-10-CM | POA: Diagnosis not present

## 2020-05-26 DIAGNOSIS — I10 Essential (primary) hypertension: Secondary | ICD-10-CM | POA: Diagnosis not present

## 2020-05-26 DIAGNOSIS — H35371 Puckering of macula, right eye: Secondary | ICD-10-CM | POA: Diagnosis not present

## 2020-05-26 DIAGNOSIS — H338 Other retinal detachments: Secondary | ICD-10-CM | POA: Diagnosis not present

## 2020-05-26 DIAGNOSIS — I739 Peripheral vascular disease, unspecified: Secondary | ICD-10-CM | POA: Diagnosis not present

## 2020-05-26 DIAGNOSIS — H35033 Hypertensive retinopathy, bilateral: Secondary | ICD-10-CM | POA: Diagnosis not present

## 2020-05-26 DIAGNOSIS — H33001 Unspecified retinal detachment with retinal break, right eye: Secondary | ICD-10-CM

## 2020-05-26 DIAGNOSIS — I2510 Atherosclerotic heart disease of native coronary artery without angina pectoris: Secondary | ICD-10-CM | POA: Diagnosis not present

## 2020-05-26 DIAGNOSIS — H3321 Serous retinal detachment, right eye: Secondary | ICD-10-CM | POA: Diagnosis not present

## 2020-05-26 HISTORY — PX: RETINAL DETACHMENT SURGERY: SHX105

## 2020-05-26 HISTORY — PX: PHOTOCOAGULATION: SHX5303

## 2020-05-26 HISTORY — PX: PARS PLANA VITRECTOMY: SHX2166

## 2020-05-26 HISTORY — PX: GAS INSERTION: SHX5336

## 2020-05-26 HISTORY — PX: EYE SURGERY: SHX253

## 2020-05-26 HISTORY — PX: MEMBRANE PEEL: SHX5967

## 2020-05-26 LAB — CBC
HCT: 45.2 % (ref 39.0–52.0)
Hemoglobin: 14.7 g/dL (ref 13.0–17.0)
MCH: 29.6 pg (ref 26.0–34.0)
MCHC: 32.5 g/dL (ref 30.0–36.0)
MCV: 90.9 fL (ref 80.0–100.0)
Platelets: 232 10*3/uL (ref 150–400)
RBC: 4.97 MIL/uL (ref 4.22–5.81)
RDW: 12 % (ref 11.5–15.5)
WBC: 7.9 10*3/uL (ref 4.0–10.5)
nRBC: 0 % (ref 0.0–0.2)

## 2020-05-26 LAB — COMPREHENSIVE METABOLIC PANEL
ALT: 44 U/L (ref 0–44)
AST: 28 U/L (ref 15–41)
Albumin: 4.3 g/dL (ref 3.5–5.0)
Alkaline Phosphatase: 60 U/L (ref 38–126)
Anion gap: 11 (ref 5–15)
BUN: 16 mg/dL (ref 8–23)
CO2: 23 mmol/L (ref 22–32)
Calcium: 9.3 mg/dL (ref 8.9–10.3)
Chloride: 102 mmol/L (ref 98–111)
Creatinine, Ser: 0.68 mg/dL (ref 0.61–1.24)
GFR, Estimated: >60 mL/min (ref >60)
Glucose, Bld: 98 mg/dL (ref 70–99)
Potassium: 4 mmol/L (ref 3.5–5.1)
Sodium: 136 mmol/L (ref 135–145)
Total Bilirubin: 1 mg/dL (ref 0.3–1.2)
Total Protein: 7 g/dL (ref 6.5–8.1)

## 2020-05-26 SURGERY — PARS PLANA VITRECTOMY WITH 25 GAUGE
Anesthesia: General | Site: Eye | Laterality: Right

## 2020-05-26 MED ORDER — TROPICAMIDE 1 % OP SOLN
OPHTHALMIC | Status: AC
  Administered 2020-05-26: 1 [drp] via OPHTHALMIC
  Filled 2020-05-26: qty 15

## 2020-05-26 MED ORDER — EPINEPHRINE PF 1 MG/ML IJ SOLN
INTRAMUSCULAR | Status: DC | PRN
  Administered 2020-05-26: .3 mL

## 2020-05-26 MED ORDER — GATIFLOXACIN 0.5 % OP SOLN
OPHTHALMIC | Status: AC
  Filled 2020-05-26: qty 2.5

## 2020-05-26 MED ORDER — MEPERIDINE HCL 25 MG/ML IJ SOLN: 6.2500 mg | INTRAMUSCULAR | Status: DC | PRN

## 2020-05-26 MED ORDER — BSS IO SOLN
INTRAOCULAR | Status: AC
  Filled 2020-05-26: qty 15

## 2020-05-26 MED ORDER — BRILLIANT BLUE G 0.025 % IO SOSY
PREFILLED_SYRINGE | INTRAOCULAR | Status: DC | PRN
  Administered 2020-05-26: 0.5 mL via INTRAVITREAL

## 2020-05-26 MED ORDER — TRIAMCINOLONE ACETONIDE 40 MG/ML IJ SUSP
INTRAMUSCULAR | Status: DC | PRN
  Administered 2020-05-26: 40 mg

## 2020-05-26 MED ORDER — ONDANSETRON HCL 4 MG/2ML IJ SOLN
INTRAMUSCULAR | Status: DC | PRN
  Administered 2020-05-26: 4 mg via INTRAVENOUS

## 2020-05-26 MED ORDER — NA CHONDROIT SULF-NA HYALURON 40-30 MG/ML IO SOLN
INTRAOCULAR | Status: DC | PRN
  Administered 2020-05-26: 0.5 mL via INTRAOCULAR

## 2020-05-26 MED ORDER — AMISULPRIDE (ANTIEMETIC) 5 MG/2ML IV SOLN: 10.0000 mg | Freq: Once | INTRAVENOUS | Status: DC | PRN

## 2020-05-26 MED ORDER — GATIFLOXACIN 0.5 % OP SOLN OPTIME - NO CHARGE
OPHTHALMIC | Status: DC | PRN
  Administered 2020-05-26: 1 [drp] via OPHTHALMIC

## 2020-05-26 MED ORDER — DORZOLAMIDE HCL-TIMOLOL MAL 2-0.5 % OP SOLN
OPHTHALMIC | Status: DC | PRN
  Administered 2020-05-26: 1 [drp] via OPHTHALMIC

## 2020-05-26 MED ORDER — ATROPINE SULFATE 1 % OP SOLN
1.0000 [drp] | OPHTHALMIC | Status: AC | PRN
  Administered 2020-05-26 (×2): 1 [drp] via OPHTHALMIC

## 2020-05-26 MED ORDER — FENTANYL CITRATE (PF) 250 MCG/5ML IJ SOLN
INTRAMUSCULAR | Status: AC
  Filled 2020-05-26: qty 5

## 2020-05-26 MED ORDER — BACITRACIN-POLYMYXIN B 500-10000 UNIT/GM OP OINT
TOPICAL_OINTMENT | OPHTHALMIC | Status: AC
  Filled 2020-05-26: qty 3.5

## 2020-05-26 MED ORDER — MIDAZOLAM HCL 5 MG/5ML IJ SOLN
INTRAMUSCULAR | Status: DC | PRN
  Administered 2020-05-26: 2 mg via INTRAVENOUS

## 2020-05-26 MED ORDER — LACTATED RINGERS IV SOLN: INTRAVENOUS | Status: DC

## 2020-05-26 MED ORDER — ACETAMINOPHEN 10 MG/ML IV SOLN
1000.0000 mg | Freq: Once | INTRAVENOUS | Status: DC | PRN
  Administered 2020-05-26: 1000 mg via INTRAVENOUS

## 2020-05-26 MED ORDER — TROPICAMIDE 1 % OP SOLN
1.0000 [drp] | OPHTHALMIC | Status: AC | PRN
  Administered 2020-05-26 (×2): 1 [drp] via OPHTHALMIC

## 2020-05-26 MED ORDER — DEXAMETHASONE SODIUM PHOSPHATE 10 MG/ML IJ SOLN
INTRAMUSCULAR | Status: DC | PRN
  Administered 2020-05-26: 10 mg via INTRAVENOUS

## 2020-05-26 MED ORDER — SODIUM CHLORIDE (PF) 0.9 % IJ SOLN
INTRAMUSCULAR | Status: AC
  Filled 2020-05-26: qty 10

## 2020-05-26 MED ORDER — PHENYLEPHRINE HCL 10 % OP SOLN
OPHTHALMIC | Status: AC
  Administered 2020-05-26: 1 [drp] via OPHTHALMIC
  Filled 2020-05-26: qty 5

## 2020-05-26 MED ORDER — PHENYLEPHRINE HCL 10 % OP SOLN
1.0000 [drp] | OPHTHALMIC | Status: AC | PRN
  Administered 2020-05-26 (×2): 1 [drp] via OPHTHALMIC

## 2020-05-26 MED ORDER — MIDAZOLAM HCL 2 MG/2ML IJ SOLN
INTRAMUSCULAR | Status: AC
  Filled 2020-05-26: qty 2

## 2020-05-26 MED ORDER — PROPOFOL 10 MG/ML IV BOLUS
INTRAVENOUS | Status: DC | PRN
  Administered 2020-05-26: 200 mg via INTRAVENOUS

## 2020-05-26 MED ORDER — PROPARACAINE HCL 0.5 % OP SOLN
1.0000 [drp] | OPHTHALMIC | Status: AC | PRN
  Administered 2020-05-26 (×2): 1 [drp] via OPHTHALMIC

## 2020-05-26 MED ORDER — 0.9 % SODIUM CHLORIDE (POUR BTL) OPTIME
TOPICAL | Status: DC | PRN
  Administered 2020-05-26: 500 mL

## 2020-05-26 MED ORDER — FENTANYL CITRATE (PF) 250 MCG/5ML IJ SOLN
INTRAMUSCULAR | Status: DC | PRN
  Administered 2020-05-26: 100 ug via INTRAVENOUS
  Administered 2020-05-26: 50 ug via INTRAVENOUS
  Administered 2020-05-26: 25 ug via INTRAVENOUS
  Administered 2020-05-26: 50 ug via INTRAVENOUS
  Administered 2020-05-26: 25 ug via INTRAVENOUS
  Administered 2020-05-26: 100 ug via INTRAVENOUS
  Administered 2020-05-26: 50 ug via INTRAVENOUS

## 2020-05-26 MED ORDER — NA CHONDROIT SULF-NA HYALURON 40-30 MG/ML IO SOLN
INTRAOCULAR | Status: AC
  Filled 2020-05-26: qty 0.5

## 2020-05-26 MED ORDER — CHLORHEXIDINE GLUCONATE 0.12 % MT SOLN: 15.0000 mL | Freq: Once | OROMUCOSAL | Status: AC

## 2020-05-26 MED ORDER — PROPOFOL 10 MG/ML IV BOLUS
INTRAVENOUS | Status: AC
  Filled 2020-05-26: qty 20

## 2020-05-26 MED ORDER — LIDOCAINE 2% (20 MG/ML) 5 ML SYRINGE
INTRAMUSCULAR | Status: DC | PRN
  Administered 2020-05-26: 100 mg via INTRAVENOUS

## 2020-05-26 MED ORDER — PROPARACAINE HCL 0.5 % OP SOLN
OPHTHALMIC | Status: AC
  Administered 2020-05-26: 1 [drp] via OPHTHALMIC
  Filled 2020-05-26: qty 15

## 2020-05-26 MED ORDER — POLYMYXIN B SULFATE 500000 UNITS IJ SOLR
INTRAMUSCULAR | Status: AC
  Filled 2020-05-26: qty 500000

## 2020-05-26 MED ORDER — ACETAMINOPHEN 10 MG/ML IV SOLN
INTRAVENOUS | Status: AC
  Filled 2020-05-26: qty 100

## 2020-05-26 MED ORDER — STERILE WATER FOR INJECTION IJ SOLN
INTRAMUSCULAR | Status: DC | PRN
  Administered 2020-05-26: 20 mL

## 2020-05-26 MED ORDER — ACETAMINOPHEN 325 MG PO TABS: 325.0000 mg | ORAL_TABLET | Freq: Once | ORAL | Status: DC | PRN

## 2020-05-26 MED ORDER — DORZOLAMIDE HCL-TIMOLOL MAL 2-0.5 % OP SOLN
OPHTHALMIC | Status: AC
  Filled 2020-05-26: qty 10

## 2020-05-26 MED ORDER — SODIUM CHLORIDE 0.9 % IV SOLN: INTRAVENOUS | Status: DC

## 2020-05-26 MED ORDER — BRIMONIDINE TARTRATE 0.2 % OP SOLN
OPHTHALMIC | Status: AC
  Filled 2020-05-26: qty 5

## 2020-05-26 MED ORDER — TRIAMCINOLONE ACETONIDE 40 MG/ML IJ SUSP
INTRAMUSCULAR | Status: AC
  Filled 2020-05-26: qty 5

## 2020-05-26 MED ORDER — FENTANYL CITRATE (PF) 100 MCG/2ML IJ SOLN
INTRAMUSCULAR | Status: AC
  Filled 2020-05-26: qty 2

## 2020-05-26 MED ORDER — BACITRACIN-POLYMYXIN B 500-10000 UNIT/GM OP OINT
TOPICAL_OINTMENT | OPHTHALMIC | Status: DC | PRN
  Administered 2020-05-26: 1 via OPHTHALMIC

## 2020-05-26 MED ORDER — FENTANYL CITRATE (PF) 100 MCG/2ML IJ SOLN
25.0000 ug | INTRAMUSCULAR | Status: DC | PRN
  Administered 2020-05-26: 50 ug via INTRAVENOUS

## 2020-05-26 MED ORDER — CEFTAZIDIME 1 G IJ SOLR
INTRAMUSCULAR | Status: AC
  Filled 2020-05-26: qty 1

## 2020-05-26 MED ORDER — ATROPINE SULFATE 1 % OP SOLN
OPHTHALMIC | Status: DC | PRN
  Administered 2020-05-26: 1 [drp] via OPHTHALMIC

## 2020-05-26 MED ORDER — BSS PLUS IO SOLN
INTRAOCULAR | Status: AC
  Filled 2020-05-26: qty 500

## 2020-05-26 MED ORDER — BRIMONIDINE TARTRATE 0.2 % OP SOLN
OPHTHALMIC | Status: DC | PRN
  Administered 2020-05-26: 1 [drp] via OPHTHALMIC

## 2020-05-26 MED ORDER — PHENYLEPHRINE 40 MCG/ML (10ML) SYRINGE FOR IV PUSH (FOR BLOOD PRESSURE SUPPORT)
PREFILLED_SYRINGE | INTRAVENOUS | Status: DC | PRN
  Administered 2020-05-26 (×2): 120 ug via INTRAVENOUS
  Administered 2020-05-26: 80 ug via INTRAVENOUS

## 2020-05-26 MED ORDER — ACETAMINOPHEN 160 MG/5ML PO SOLN: 325.0000 mg | Freq: Once | ORAL | Status: DC | PRN

## 2020-05-26 MED ORDER — BSS PLUS IO SOLN
INTRAOCULAR | Status: DC | PRN
  Administered 2020-05-26: 1 via INTRAOCULAR

## 2020-05-26 MED ORDER — CHLORHEXIDINE GLUCONATE 0.12 % MT SOLN
OROMUCOSAL | Status: AC
  Administered 2020-05-26: 15 mL via OROMUCOSAL
  Filled 2020-05-26: qty 15

## 2020-05-26 MED ORDER — PREDNISOLONE ACETATE 1 % OP SUSP
OPHTHALMIC | Status: AC
  Filled 2020-05-26: qty 5

## 2020-05-26 MED ORDER — STERILE WATER FOR INJECTION IJ SOLN
INTRAMUSCULAR | Status: AC
  Filled 2020-05-26: qty 10

## 2020-05-26 MED ORDER — SUGAMMADEX SODIUM 200 MG/2ML IV SOLN
INTRAVENOUS | Status: DC | PRN
  Administered 2020-05-26: 200 mg via INTRAVENOUS

## 2020-05-26 MED ORDER — ORAL CARE MOUTH RINSE: 15.0000 mL | Freq: Once | OROMUCOSAL | Status: AC

## 2020-05-26 MED ORDER — STERILE WATER FOR INJECTION IJ SOLN
INTRAMUSCULAR | Status: DC | PRN
  Administered 2020-05-26: 500 mL

## 2020-05-26 MED ORDER — ATROPINE SULFATE 1 % OP SOLN
OPHTHALMIC | Status: AC
  Administered 2020-05-26: 1 [drp] via OPHTHALMIC
  Filled 2020-05-26: qty 5

## 2020-05-26 MED ORDER — ROCURONIUM BROMIDE 10 MG/ML (PF) SYRINGE
PREFILLED_SYRINGE | INTRAVENOUS | Status: DC | PRN
  Administered 2020-05-26: 10 mg via INTRAVENOUS
  Administered 2020-05-26: 60 mg via INTRAVENOUS
  Administered 2020-05-26: 10 mg via INTRAVENOUS
  Administered 2020-05-26: 30 mg via INTRAVENOUS

## 2020-05-26 SURGICAL SUPPLY — 44 items
APPLICATOR COTTON TIP 6 STRL (MISCELLANEOUS) ×2 IMPLANT
APPLICATOR COTTON TIP 6IN STRL (MISCELLANEOUS) ×6
BETADINE 5% OPHTHALMIC (OPHTHALMIC) ×2 IMPLANT
BNDG EYE OVAL (GAUZE/BANDAGES/DRESSINGS) ×3 IMPLANT
CABLE BIPOLOR RESECTION CORD (MISCELLANEOUS) ×3 IMPLANT
CANNULA FLEX TIP 25G (CANNULA) ×3 IMPLANT
CLOSURE STERI-STRIP 1/2X4 (GAUZE/BANDAGES/DRESSINGS) ×1
CLSR STERI-STRIP ANTIMIC 1/2X4 (GAUZE/BANDAGES/DRESSINGS) ×2 IMPLANT
COTTONBALL LRG STERILE PKG (GAUZE/BANDAGES/DRESSINGS) ×9 IMPLANT
COVER WAND RF STERILE (DRAPES) ×3 IMPLANT
DRAPE MICROSCOPE LEICA 46X105 (MISCELLANEOUS) ×3 IMPLANT
DRAPE OPHTHALMIC 77X100 STRL (CUSTOM PROCEDURE TRAY) ×3 IMPLANT
FORCEPS GRIESHABER ILM 25G A (INSTRUMENTS) ×3 IMPLANT
GAS AUTO FILL CONSTEL (OPHTHALMIC) ×3
GAS AUTO FILL CONSTELLATION (OPHTHALMIC) ×1 IMPLANT
GLOVE BIO SURGEON STRL SZ7.5 (GLOVE) ×6 IMPLANT
GLOVE BIOGEL M 7.0 STRL (GLOVE) ×6 IMPLANT
GOWN STRL REUS W/ TWL LRG LVL3 (GOWN DISPOSABLE) ×2 IMPLANT
GOWN STRL REUS W/ TWL XL LVL3 (GOWN DISPOSABLE) ×1 IMPLANT
GOWN STRL REUS W/TWL LRG LVL3 (GOWN DISPOSABLE) ×4
GOWN STRL REUS W/TWL XL LVL3 (GOWN DISPOSABLE) ×2
KIT BASIN OR (CUSTOM PROCEDURE TRAY) ×3 IMPLANT
LOOP FINESSE 25 GA (MISCELLANEOUS) ×3 IMPLANT
NEEDLE 18GX1X1/2 (RX/OR ONLY) (NEEDLE) ×3 IMPLANT
NEEDLE 25GX 5/8IN NON SAFETY (NEEDLE) ×9 IMPLANT
NEEDLE HYPO 30X.5 LL (NEEDLE) ×6 IMPLANT
NS IRRIG 1000ML POUR BTL (IV SOLUTION) ×3 IMPLANT
OPHTHALMIC BETADINE 5% (OPHTHALMIC) ×4
PACK VITRECTOMY CUSTOM (CUSTOM PROCEDURE TRAY) ×3 IMPLANT
PAD ARMBOARD 7.5X6 YLW CONV (MISCELLANEOUS) ×6 IMPLANT
PAK PIK VITRECTOMY CVS 25GA (OPHTHALMIC) ×3 IMPLANT
PENCIL BIPOLAR 25GA STR DISP (OPHTHALMIC RELATED) ×3 IMPLANT
PROBE ENDO DIATHERMY 25G (MISCELLANEOUS) ×3 IMPLANT
PROBE LASER ILLUM FLEX CVD 25G (OPHTHALMIC) ×3 IMPLANT
SHIELD EYE LENSE ONLY DISP (GAUZE/BANDAGES/DRESSINGS) ×3 IMPLANT
SUT VICRYL 7 0 TG140 8 (SUTURE) ×3 IMPLANT
SYR 10ML LL (SYRINGE) ×3 IMPLANT
SYR 20ML LL LF (SYRINGE) ×3 IMPLANT
SYR 5ML LL (SYRINGE) ×3 IMPLANT
SYR BULB EAR ULCER 3OZ GRN STR (SYRINGE) ×3 IMPLANT
SYR TB 1ML LUER SLIP (SYRINGE) ×6 IMPLANT
TOWEL GREEN STERILE FF (TOWEL DISPOSABLE) ×3 IMPLANT
TUBING HIGH PRESS EXTEN 6IN (TUBING) ×3 IMPLANT
WATER STERILE IRR 1000ML POUR (IV SOLUTION) ×3 IMPLANT

## 2020-05-26 NOTE — Discharge Instructions (Addendum)
POSTOPERATIVE INSTRUCTIONS  Your doctor has performed vitreoretinal surgery on you at [facility]. [place] eye patched and shielded until seen by Dr. Coralyn Pear 815 AM tomorrow in clinic - Do not use drops until return - [place] - Sleep with belly down or on left side, avoid laying flat on back.    - No strenuous bending, stooping or lifting.  - You may not drive until further notice.  - If your doctor used a gas bubble in your eye during the procedure he will advise you on postoperative positioning. If you have a gas bubble you will be wearing a green bracelet that was applied in the operating room. The green bracelet should stay on as [place] as the gas bubble is in your eye. While the gas bubble is present you should not fly in an airplane. If you require general anesthesia while the gas bubble is present you must notify your anesthesiologist that an intraocular gas bubble is present so he can take the appropriate precautions.  - Tylenol or any other over-the-counter pain reliever can be used according to your doctor. If more pain medicine is required, your doctor will have a prescription for you.  - You may read, go up and down stairs, and watch television.     Bernarda Caffey, M.D., Ph.D.

## 2020-05-26 NOTE — Op Note (Signed)
Date of procedure:  05/26/2020   Surgeon: Bernarda Caffey, MD, PhD  Assistant: Ernest Mallick, Ophthalmic Assistant    Pre-operative Diagnoses:  Retinal detachment with epiretinal membrane, right eye   Post-operative diagnosis:  same   Anesthesia: General   Procedure:   1. 25 gauge pars plana vitrectomy, Right Eye 2. TissueBlue stain, Right Eye 3. Internal Limiting Membrane and epiretinal membrane peel, Right Eye  4. Endolaser, Right Eye 5. Injection 14% C3F8, Right Eye    Indications for procedure: This is a 64 yo M with a history of retinal detachment, right eye, s/p pneumatic cryopexy OD on 9.24.21. The retinal detachment improved and reattached partially, but an epiretinal membrane (ERM) prevented full reattachment. After discussing the risks, benefits, and alternatives to surgery, the patient electively decided to undergo surgical repair and informed consent was obtained. The surgery was an attempt to remove the ERM and ILM to allow full reattachment of the retina and potentially improve the vision within the reasonable expectations of the surgeon.    Procedure in Detail:    The patient was met in the pre-operative holding area where their identification data was verified.  It was noted that there was a signed, informed consent in the chart and the Right Eye eye was verbally verified by the patient as the operative eye and was marked with a marking pen. The patient was then taken to the operating room and placed in the supine position. General endotracheal anesthesia was induced.  The eye was then prepped with 5% betadine and draped in the normal fashion for ophthalmic surgery. The microscope was draped and swung into position, and a secondary time-out was performed to identify the correct patient, eyes, procedures, and any allergies.   A 25 gauge trocar was inserted in a 30-45 degrees fashion into the inferotemporal quadrant 4 mm posterior to the limbus in this phakic patient. Correct  positioning within the vitreous was verified externally with the light pipe. The infusion was then connected to the cannula and BSS infusion was commenced.  Additional ports were placed in the superonasal and superotemporal quadrants. Viscoat was placed on the cornea. The BIOM was used to visualize the posterior segment and vitrectomy was performed to remove the residual gas bubble in the vitreous cavity. Then, a thorough core and peripheral vitrectomy was performed and a posterior vitreous detachment was confirmed using suction over the optic nerve head and lifting anteriorly. Kenalog was used to aid in identifying residual cortical vitreous.      TissueBlue was then used to stain the internal limiting membrane and counter-stain the ERM/preretinal PVR membranes. A macular contact lens was placed on the eye. End-grasping ILM forceps were used to create an opening in the ILM and ILM, ERM, and preretinal PVR membranes were meticulously peeled off the entire surface of the retina as was deemed safe. Of note, as there was some shallow residual SRF under the retina, the retina was very mobile during the peel.   The wide angle viewing system was brought back into position and a thorough dissection of the peripheral vitreous was performed under scleral depression. At this time, supplementary endolaser was applied posterior to the areas of cryopexy and 360 peripherally. Of note, there were no retinal tears and the original retinal tear at 1030 was sealed down nicely by the cryopexy. An air fluid exchange was then performed.    The superotemporal port was then removed and sutured with 7-0 vicryl, there was no leakage. 14% C3F8 gas was connected to  the infusion line and gas was injected into the posterior segment while venting air through the superonasal trocar using the extrusion cannula. Once a full, 40cc of gas was vented through the eye, the infusion port and venting ports were removed and they were sutured with 7-0  vicryl.  There was no leakage from the sclerotomy sites.   Subconjunctival injections of kefzol + bacitracin + polymixin b and kenalog were then administered, and antibiotic and steroid drops as well as antibiotic ointment were placed in the eye.  The drapes were removed and the eye was patched and shielded.  A green gas bracelet was placed on the patient's right wrist. The patient was then taken to the post-operative area for recovery having tolerated the procedure well.  She was instructed to perform face down positioning postoperatively and to follow up in clinic the following morning as scheduled.   Estimate blood lost: none Complications: None

## 2020-05-26 NOTE — Brief Op Note (Signed)
05/26/2020  5:13 PM  PATIENT:  Todd Weeks  64 y.o. male  PRE-OPERATIVE DIAGNOSIS:  retinal detachement / epiretinal membrane, right eye  POST-OPERATIVE DIAGNOSIS:  retinal detachement / epiretinal membrane, right eye  PROCEDURE:  Procedure(s): PARS PLANA VITRECTOMY WITH 25 GAUGE (Right) MEMBRANE PEEL (Right) PHOTOCOAGULATION (Right) INSERTION OF GAS (Right)  SURGEON:  Surgeon(s) and Role:    Bernarda Caffey, MD - Primary  ASSISTANTS: Ernest Mallick, Ophthalmic Assistant   ANESTHESIA:   general  EBL:  minimal   BLOOD ADMINISTERED:none  DRAINS: none   LOCAL MEDICATIONS USED:  NONE  SPECIMEN:  No Specimen  DISPOSITION OF SPECIMEN:  N/A  COUNTS:  YES  TOURNIQUET:  * No tourniquets in log *  DICTATION: .Note written in EPIC  PLAN OF CARE: Discharge to home after PACU  PATIENT DISPOSITION:  PACU - hemodynamically stable.   Delay start of Pharmacological VTE agent (>24hrs) due to surgical blood loss or risk of bleeding: not applicable

## 2020-05-26 NOTE — Interval H&P Note (Signed)
History and Physical Interval Note:  05/26/2020 2:08 PM  Todd Weeks  has presented today for surgery, with the diagnosis of retinal detachement / epiretinal membrane, right eye.  The various methods of treatment have been discussed with the patient and family. After consideration of risks, benefits and other options for treatment, the patient has consented to  Procedure(s): PARS PLANA VITRECTOMY WITH 25 GAUGE (Right) MEMBRANE PEEL (Right) as a surgical intervention.  The patient's history has been reviewed, patient examined, no change in status, stable for surgery.  I have reviewed the patient's chart and labs.  Questions were answered to the patient's satisfaction.     Bernarda Caffey

## 2020-05-26 NOTE — Anesthesia Preprocedure Evaluation (Addendum)
Anesthesia Evaluation  Patient identified by MRN, date of birth, ID band Patient awake    Reviewed: Allergy & Precautions, NPO status , Patient's Chart, lab work & pertinent test results  Airway Mallampati: II  TM Distance: >3 FB Neck ROM: Full    Dental  (+) Teeth Intact, Dental Advisory Given   Pulmonary former smoker,    breath sounds clear to auscultation       Cardiovascular hypertension, Pt. on medications + CAD and + Peripheral Vascular Disease   Rhythm:Regular Rate:Normal     Neuro/Psych  Headaches, PSYCHIATRIC DISORDERS Anxiety Depression    GI/Hepatic Neg liver ROS, GERD  Medicated,  Endo/Other  negative endocrine ROS  Renal/GU negative Renal ROS     Musculoskeletal negative musculoskeletal ROS (+)   Abdominal Normal abdominal exam  (+)   Peds  Hematology negative hematology ROS (+)   Anesthesia Other Findings - HLD  Reproductive/Obstetrics                            Anesthesia Physical Anesthesia Plan  ASA: II  Anesthesia Plan: General   Post-op Pain Management:    Induction: Intravenous  PONV Risk Score and Plan: 3 and Ondansetron, Dexamethasone and Midazolam  Airway Management Planned: Oral ETT  Additional Equipment: None  Intra-op Plan:   Post-operative Plan: Extubation in OR  Informed Consent: I have reviewed the patients History and Physical, chart, labs and discussed the procedure including the risks, benefits and alternatives for the proposed anesthesia with the patient or authorized representative who has indicated his/her understanding and acceptance.     Dental advisory given  Plan Discussed with: CRNA  Anesthesia Plan Comments: (EKG: normal sinus rhythm.  Echo:  - Left ventricle: The cavity size was normal. Systolic function was  normal. The estimated ejection fraction was in the range of 55%  to 60%. Wall motion was normal; there were no  regional wall  motion abnormalities.  - Aortic valve: Transvalvular velocity was within the normal range.  There was no stenosis. There was no significant regurgitation.  - Mitral valve: There was trivial regurgitation.  - Left atrium: The atrium was mildly dilated.  - Right ventricle: Systolic function was normal.  - Right atrium: The atrium was mildly dilated.  - Atrial septum: No defect or patent foramen ovale was identified.  - Tricuspid valve: There was trivial regurgitation.  - Pulmonic valve: There was no significant regurgitation.  - Inferior vena cava: The vessel was normal in size. The  respirophasic diameter changes were in the normal range (>= 50%),  consistent with normal central venous pressure.  )      Anesthesia Quick Evaluation

## 2020-05-26 NOTE — Anesthesia Procedure Notes (Signed)
Procedure Name: Intubation Date/Time: 05/26/2020 3:02 PM Performed by: Shirlyn Goltz, CRNA Pre-anesthesia Checklist: Patient identified, Emergency Drugs available, Suction available and Patient being monitored Patient Re-evaluated:Patient Re-evaluated prior to induction Oxygen Delivery Method: Circle system utilized Preoxygenation: Pre-oxygenation with 100% oxygen Induction Type: IV induction Ventilation: Mask ventilation without difficulty and Oral airway inserted - appropriate to patient size Laryngoscope Size: Mac and 4 Grade View: Grade I Tube type: Oral Tube size: 7.5 mm Number of attempts: 1 Airway Equipment and Method: Stylet Placement Confirmation: ETT inserted through vocal cords under direct vision,  positive ETCO2 and breath sounds checked- equal and bilateral Secured at: 23 cm Tube secured with: Tape Dental Injury: Teeth and Oropharynx as per pre-operative assessment

## 2020-05-26 NOTE — Anesthesia Postprocedure Evaluation (Signed)
Anesthesia Post Note  Patient: Todd Weeks  Procedure(s) Performed: PARS PLANA VITRECTOMY WITH 25 GAUGE (Right Eye) MEMBRANE PEEL (Right Eye) PHOTOCOAGULATION (Right Eye) INSERTION OF GAS (Right Eye)     Patient location during evaluation: PACU Anesthesia Type: General Level of consciousness: awake Pain management: pain level controlled Vital Signs Assessment: post-procedure vital signs reviewed and stable Respiratory status: spontaneous breathing Cardiovascular status: stable Postop Assessment: no apparent nausea or vomiting Anesthetic complications: no   No complications documented.  Last Vitals:  Vitals:   05/26/20 1714 05/26/20 1730  BP: (!) 148/76 (!) 143/65  Pulse: 91 78  Resp: 19 13  Temp: 36.9 C   SpO2: 90% 93%    Last Pain:  Vitals:   05/26/20 1730  TempSrc:   PainSc: 4                  Javonne Louissaint

## 2020-05-26 NOTE — Transfer of Care (Signed)
Immediate Anesthesia Transfer of Care Note  Patient: Todd Weeks  Procedure(s) Performed: PARS PLANA VITRECTOMY WITH 25 GAUGE (Right Eye) MEMBRANE PEEL (Right Eye) PHOTOCOAGULATION (Right Eye) INSERTION OF GAS (Right Eye)  Patient Location: PACU  Anesthesia Type:General  Level of Consciousness: awake, alert , oriented and patient cooperative  Airway & Oxygen Therapy: Patient Spontanous Breathing and Patient connected to nasal cannula oxygen  Post-op Assessment: Report given to RN and Post -op Vital signs reviewed and stable  Post vital signs: Reviewed and stable  Last Vitals:  Vitals Value Taken Time  BP 148/76 05/26/20 1714  Temp    Pulse 85 05/26/20 1716  Resp 7 05/26/20 1716  SpO2 86 % 05/26/20 1716  Vitals shown include unvalidated device data.  Last Pain:  Vitals:   05/26/20 1238  TempSrc:   PainSc: 0-No pain      Patients Stated Pain Goal: 5 (37/09/64 3838)  Complications: No complications documented.

## 2020-05-27 ENCOUNTER — Encounter (HOSPITAL_COMMUNITY): Payer: Self-pay | Admitting: Ophthalmology

## 2020-05-27 ENCOUNTER — Ambulatory Visit (INDEPENDENT_AMBULATORY_CARE_PROVIDER_SITE_OTHER): Payer: PPO | Admitting: Ophthalmology

## 2020-05-27 DIAGNOSIS — H3321 Serous retinal detachment, right eye: Secondary | ICD-10-CM

## 2020-05-27 DIAGNOSIS — H3581 Retinal edema: Secondary | ICD-10-CM

## 2020-05-27 DIAGNOSIS — I10 Essential (primary) hypertension: Secondary | ICD-10-CM

## 2020-05-27 DIAGNOSIS — H25813 Combined forms of age-related cataract, bilateral: Secondary | ICD-10-CM

## 2020-05-27 DIAGNOSIS — H33312 Horseshoe tear of retina without detachment, left eye: Secondary | ICD-10-CM

## 2020-05-27 DIAGNOSIS — H35033 Hypertensive retinopathy, bilateral: Secondary | ICD-10-CM

## 2020-05-27 DIAGNOSIS — H35371 Puckering of macula, right eye: Secondary | ICD-10-CM

## 2020-05-30 NOTE — Progress Notes (Signed)
[facility] Note  06/02/2020     CHIEF COMPLAINT Patient presents for Post-op Follow-up   HISTORY OF PRESENT ILLNESS: Todd Weeks is a 64 y.o. male who presents to the clinic today for:  HPI    Post-op Follow-up    In right eye.  Discomfort includes pain and itching.  Vision is stable.  I, the attending physician,  performed the HPI with the patient and updated documentation appropriately.          Comments    Pt states vision is the same OD.  Pt states vision is no better OD.  Pt denies eye pain OU.  Pt states OD itches and accidentally bumped his right eye with his hand the other day.       Last edited by Bernarda Caffey, MD on 06/02/2020  8:44 AM. (History)    Pt states he is having a hard time sleeping, he is doing at least 30 mins of face down time per day  Referring physician: Vivi Barrack, MD [place],  [place] [address]  HISTORICAL INFORMATION:   Selected notes from the MEDICAL RECORD NUMBER    CURRENT MEDICATIONS: Current Outpatient Medications (Ophthalmic Drugs)  Medication Sig  . bacitracin-polymyxin b (POLYSPORIN) ophthalmic ointment Place into the right eye at bedtime for 10 days. Use at bedtime and as needed during the day  . dorzolamide-timolol (COSOPT) 22.3-6.8 MG/ML ophthalmic solution Place 1 drop into the right eye 2 (two) times daily.  Marland Kitchen neomycin-polymyxin b-dexamethasone (MAXITROL) 3.5-10000-0.1 OINT Place 1 application into the right eye 4 (four) times daily.  . prednisoLONE acetate (PRED FORTE) 1 % ophthalmic suspension Place 1 drop into the right eye 4 (four) times daily.   No current facility-administered medications for this visit. (Ophthalmic Drugs)   Current Outpatient Medications (Other)  Medication Sig  . bacitracin 500 UNIT/GM ointment Apply 1 application topically 2 (two) times daily. (Patient not taking: Reported on 05/19/2020)  . clopidogrel (PLAVIX) 75 MG tablet TAKE (1) TABLET BY MOUTH ONCE  DAILY.  . famotidine (PEPCID) 20 MG tablet One daily (Patient taking differently: Take 20 mg by mouth daily. )  . FLUoxetine (PROZAC) 20 MG capsule TAKE 1 CAPSULE BY MOUTH EVERY MORNING  . irbesartan (AVAPRO) 300 MG tablet Take 1 tablet (300 mg total) by mouth daily.  . Multiple Vitamins-Minerals (MULTIVITAMIN WITH MINERALS) tablet Take 1 tablet by mouth daily.  . simvastatin (ZOCOR) 40 MG tablet Take 40 mg by mouth daily.   . tamsulosin (FLOMAX) 0.4 MG CAPS capsule Take 1 capsule (0.4 mg total) by mouth daily.   No current facility-administered medications for this visit. (Other)      REVIEW OF SYSTEMS: ROS    Positive for: Gastrointestinal, Cardiovascular, Eyes   Negative for: Constitutional, Neurological, Skin, Genitourinary, Musculoskeletal, HENT, Endocrine, Respiratory, Psychiatric, Allergic/Imm, Heme/Lymph   Last edited by Doneen Poisson on 06/02/2020  8:05 AM. (History)       ALLERGIES No Known Allergies  PAST MEDICAL HISTORY Past Medical History:  Diagnosis Date  . Bell's palsy   . Cataract    Mixed form OU  . Closed nondisplaced fracture of neck of third metacarpal bone of right hand 02/04/2017  . Clotting disorder ([place])   . Depression   . Diverticulitis   . GERD (gastroesophageal reflux disease)   . Headache(784.0)   . HTN (hypertension)   . Hyperlipidemia   . Hypertensive retinopathy    OU  . Mild CAD 2012  .  PVD (peripheral vascular disease) ([place])   . Sexual dysfunction    Past Surgical History:  Procedure Laterality Date  . ABDOMINAL AORTOGRAM W/LOWER EXTREMITY Bilateral 02/05/2020   Procedure: ABDOMINAL AORTOGRAM W/LOWER EXTREMITY;  Surgeon: Elam Dutch, MD;  Location: [facility];  Service: Cardiovascular;  Laterality: Bilateral;  . EYE SURGERY Right 05/06/2020   Pneumatic retinopexy for rheg. RD repair - Dr. Bernarda Caffey  . GAS INSERTION Right 05/26/2020   Procedure: INSERTION OF GAS;  Surgeon: Bernarda Caffey, MD;  Location: [facility];   Service: Ophthalmology;  Laterality: Right;  . MEMBRANE PEEL Right 05/26/2020   Procedure: MEMBRANE PEEL;  Surgeon: Bernarda Caffey, MD;  Location: [facility];  Service: Ophthalmology;  Laterality: Right;  . PARS PLANA VITRECTOMY Right 05/26/2020   Procedure: PARS PLANA VITRECTOMY WITH 25 GAUGE;  Surgeon: Bernarda Caffey, MD;  Location: [facility];  Service: Ophthalmology;  Laterality: Right;  . PHOTOCOAGULATION Right 05/26/2020   Procedure: PHOTOCOAGULATION;  Surgeon: Bernarda Caffey, MD;  Location: [facility];  Service: Ophthalmology;  Laterality: Right;  . PROSTATE BIOPSY  2015  . RETINAL DETACHMENT SURGERY Right 05/06/2020   Pneumatic retinopexy for rheg RD repair - Dr. Bernarda Caffey    FAMILY HISTORY Family History  Problem Relation Age of Onset  . Hypertension Mother   . Deep vein thrombosis Father   . Diabetes Brother   . Tuberculosis Maternal Grandfather   . Emphysema Maternal Grandfather   . Lung disease Brother   . Asthma Son   . Rectal cancer Neg Hx   . Colon cancer Neg Hx     SOCIAL HISTORY Social History   Tobacco Use  . Smoking status: Former Smoker    Packs/day: 1.50    Years: 46.00    Pack years: 69.00    Types: Cigarettes    Quit date: 12/12/2011    Years since quitting: 8.4  . Smokeless tobacco: Never Used  Vaping Use  . Vaping Use: Never used  Substance Use Topics  . Alcohol use: Yes    Alcohol/week: 4.0 standard drinks    Types: 4 Cans of beer per week    Comment: a couple of beer on a weekend  . Drug use: Yes    Types: Cocaine, Marijuana, "Crack" cocaine    Comment: over 25 years ago         OPHTHALMIC EXAM:  Base Eye Exam    Visual Acuity (Snellen - Linear)      Right Left   Dist [place] CF @ face 20/30 -2   Dist ph [place] NI 20/25 -2       Tonometry (Tonopen, 8:06 AM)      Right Left   Pressure 12 def       Pupils      Dark Light Shape React APD   Right 6 6 Round Dilated 0   Left 3 2 Round Brisk 0       Extraocular Movement      Right Left    Full Full        Neuro/Psych    Oriented x3: Yes   Mood/Affect: Normal       Dilation    Right eye: 1.0% Mydriacyl, 2.5% Phenylephrine @ 8:06 AM        Slit Lamp and Fundus Exam    External Exam      Right Left   External periorbital edema Normal       Slit Lamp Exam      Right Left  Lids/Lashes Dermatochalasis - upper lid Normal   Conjunctiva/Sclera Subconjunctival hemorrhage 360 -- improving, sutures intact White and quiet   Cornea Trace Punctate epithelial erosions trace Punctate epithelial erosions   Anterior Chamber deep, narrow temporal angle, 0.5+ Cell/pigment Deep and quiet   Iris Round and dilated Round and dilated   Lens 2+ Nuclear sclerosis, 2+ Cortical cataract, focal cortical wedge at 0600, 1-2+ PC feathering 1+ Nuclear sclerosis, 1+ Cortical cataract   Vitreous post vitrectomy, good gas fill Vitreous syneresis, Posterior vitreous detachment       Fundus Exam      Right Left   Disc Pink and Sharp    C/D Ratio 0.5 0.6   Macula flat under gas, ERM gone    Vessels attenuated, Tortuous attenuated, mild tortuousity   Periphery Attached, good cryo ST quad, good 360 laser changes, ORIGINALLY: Temporal mac off detachment w/ HST at 1030 Attached, HST at 0130 no SRF--good laser changes surrounding, mild inferior paving stone degeneration        Refraction    Wearing Rx      Sphere Cylinder Axis Add   Right +1.00 +1.50 178 +2.00   Left +0.50 +1.25 166 +2.00          IMAGING AND PROCEDURES  Imaging and Procedures for 06/02/2020          ASSESSMENT/PLAN:    ICD-10-CM   1. Right retinal detachment  H33.21   2. Retinal edema  H35.81   3. Epiretinal membrane (ERM) of right eye  H35.371   4. Retinal tear of left eye  H33.312   5. Essential hypertension  I10   6. Hypertensive retinopathy of both eyes  H35.033   7. Combined forms of age-related cataract of both eyes  H25.813     1,2. Rhegmatogenous retinal detachment, OD - bullous superior mac off detachment,  onset of foveal involvement Thurday, 09.23.21 by pt history - detached temporally from 0730 to 11 oclock, fovea off, horse shoe tear at 1030 - s/p pneumatic retinopexy w/ C3F8 OD 09.24.21,  - supplemental intravitreal C3F8 gas injection OD (2.5cc on 10.06.21) **persistent shallow SRF remained due to ERM preventing full reattachment**  - now POW1 s/p PPV/TissueBlue stain/MP/EL/FAX/14% C3F8 OD, 10.14.2021             - did well this wk             - retina attached and ERM gone  - BCVA CF             - IOP okay at 12             - continue PF 6x/day OD -- dec to QID                         zymaxid QID OD -- stop on Saturday                         Atropine BID OD -- okay to stop now                         Brimonidine BID OD -- decrease to QD                         PSO ung QID OD              - cont face down positioning x3 days;  avoid laying flat on back              - eye shield when sleeping x1 more week             - post op drop and positioning instructions reviewed              - tylenol/ibuprofen for pain  - f/u 2-3 weeks, POV   3. Epiretinal membrane, right eye  - +ERM w/ pucker likely impacting RD and SRF OD  - now POD1 s/p PPV/TissueBlue stain/MP/EL/FAX/14% C3F8 OD, 10.14.2021 as above - ERM gone - drops as above  4. Retinal tear, OS - horse shoe tear located at 0130 - s/p laser retinopexy OS 09.24.21 - good laser changes in place  5,6. Hypertensive retinopathy OU - discussed importance of tight BP control - monitor  7. Mixed Cataract OU - The symptoms of cataract, surgical options, and treatments and risks were discussed with patient. - discussed diagnosis and progression   Ophthalmic Meds Ordered this visit:  Meds ordered this encounter  Medications  . prednisoLONE acetate (PRED FORTE) 1 % ophthalmic suspension    Sig: Place 1 drop into the right eye 4 (four) times daily.    Dispense:  15 mL    Refill:  0  . bacitracin-polymyxin b (POLYSPORIN) ophthalmic  ointment    Sig: Place into the right eye at bedtime for 10 days. Use at bedtime and as needed during the day    Dispense:  3.5 g    Refill:  3      Return for f/u 2-3 weeks, RD OD, POV.  There are no Patient Instructions on file for this visit.  This document serves as a record of services personally performed by Gardiner Sleeper, MD, PhD. It was created on their behalf by Leonie Douglas, an ophthalmic technician. The creation of this record is the provider's dictation and/or activities during the visit.    Electronically signed by: Leonie Douglas COA, 06/02/20  8:48 AM   Gardiner Sleeper, M.D., Ph.D. Diseases & Surgery of the Retina and Vitreous [facility]  I have reviewed the above documentation for accuracy and completeness, and I agree with the above. Gardiner Sleeper, M.D., Ph.D. 06/02/20 8:48 AM   Abbreviations: M myopia (nearsighted); A astigmatism; H hyperopia (farsighted); P presbyopia; Mrx spectacle prescription;  CTL contact lenses; OD right eye; OS left eye; OU both eyes  XT exotropia; ET esotropia; PEK punctate epithelial keratitis; PEE punctate epithelial erosions; DES dry eye syndrome; MGD meibomian gland dysfunction; ATs artificial tears; PFAT's preservative free artificial tears; [place] nuclear sclerotic cataract; PSC posterior subcapsular cataract; ERM epi-retinal membrane; PVD posterior vitreous detachment; RD retinal detachment; DM diabetes mellitus; DR diabetic retinopathy; NPDR non-proliferative diabetic retinopathy; PDR proliferative diabetic retinopathy; CSME clinically significant macular edema; DME diabetic macular edema; dbh dot blot hemorrhages; CWS cotton wool spot; POAG primary open angle glaucoma; C/D cup-to-disc ratio; HVF humphrey visual field; GVF goldmann visual field; OCT optical coherence tomography; IOP intraocular pressure; BRVO Branch retinal vein occlusion; CRVO central retinal vein occlusion; CRAO central retinal artery occlusion; BRAO  branch retinal artery occlusion; RT retinal tear; SB scleral buckle; PPV pars plana vitrectomy; VH Vitreous hemorrhage; PRP panretinal laser photocoagulation; IVK intravitreal kenalog; VMT vitreomacular traction; MH Macular hole;  NVD neovascularization of the disc; NVE neovascularization elsewhere; AREDS age related eye disease study; ARMD age related macular degeneration; POAG primary open angle glaucoma; EBMD epithelial/anterior basement membrane dystrophy; ACIOL anterior  chamber intraocular lens; IOL intraocular lens; PCIOL posterior chamber intraocular lens; Phaco/IOL phacoemulsification with intraocular lens placement; [place] photorefractive keratectomy; LASIK laser assisted in situ keratomileusis; HTN hypertension; DM diabetes mellitus; COPD chronic obstructive pulmonary disease

## 2020-06-02 ENCOUNTER — Other Ambulatory Visit: Payer: Self-pay

## 2020-06-02 ENCOUNTER — Encounter (INDEPENDENT_AMBULATORY_CARE_PROVIDER_SITE_OTHER): Payer: Self-pay | Admitting: Ophthalmology

## 2020-06-02 ENCOUNTER — Ambulatory Visit (INDEPENDENT_AMBULATORY_CARE_PROVIDER_SITE_OTHER): Payer: PPO | Admitting: Ophthalmology

## 2020-06-02 DIAGNOSIS — I10 Essential (primary) hypertension: Secondary | ICD-10-CM

## 2020-06-02 DIAGNOSIS — H25813 Combined forms of age-related cataract, bilateral: Secondary | ICD-10-CM

## 2020-06-02 DIAGNOSIS — H3581 Retinal edema: Secondary | ICD-10-CM

## 2020-06-02 DIAGNOSIS — H35033 Hypertensive retinopathy, bilateral: Secondary | ICD-10-CM

## 2020-06-02 DIAGNOSIS — H3321 Serous retinal detachment, right eye: Secondary | ICD-10-CM

## 2020-06-02 DIAGNOSIS — H35371 Puckering of macula, right eye: Secondary | ICD-10-CM

## 2020-06-02 DIAGNOSIS — H33312 Horseshoe tear of retina without detachment, left eye: Secondary | ICD-10-CM

## 2020-06-02 MED ORDER — PREDNISOLONE ACETATE 1 % OP SUSP: 1.0000 [drp] | Freq: Four times a day (QID) | OPHTHALMIC | 0 refills | Status: DC

## 2020-06-02 MED ORDER — BACITRACIN-POLYMYXIN B 500-10000 UNIT/GM OP OINT: TOPICAL_OINTMENT | Freq: Every day | OPHTHALMIC | 3 refills | Status: AC

## 2020-06-22 ENCOUNTER — Other Ambulatory Visit: Payer: Self-pay

## 2020-06-22 ENCOUNTER — Telehealth: Payer: Self-pay

## 2020-06-22 DIAGNOSIS — I70219 Atherosclerosis of native arteries of extremities with intermittent claudication, unspecified extremity: Secondary | ICD-10-CM

## 2020-06-22 NOTE — Telephone Encounter (Signed)
Patient called to ask if he could reschedule his aortogram after having eye surgery with nitrous oxide. Discussed with MD and patient will need office visit with ABIs prior to rescheduling surgery. Patient will ask eye surgeon about precautions after use of NO2 in the eye. He is on [place] schedule tomorrow with ABIs.

## 2020-06-22 NOTE — Progress Notes (Signed)
[facility] Note  06/23/2020     CHIEF COMPLAINT Patient presents for Retina Follow Up   HISTORY OF PRESENT ILLNESS: Todd Weeks is a 64 y.o. male who presents to the clinic today for:  HPI    Retina Follow Up    Patient presents with  Retinal Break/Detachment.  In right eye.  This started 3 weeks ago.  I, the attending physician,  performed the HPI with the patient and updated documentation appropriately.          Comments    Patient here for 3 weeks retina follow up for POV RD OD. Patient states vision not as good as thought it would be. It's improving. Has itchiness and soreness that comes and goes.        Last edited by Bernarda Caffey, MD on 06/23/2020  8:17 AM. (History)    Pt states the gas bubble is shrinking and his vision is slowly coming back  Referring physician: Vivi Barrack, MD [place],  [place] [address]  HISTORICAL INFORMATION:   Selected notes from the MEDICAL RECORD NUMBER    CURRENT MEDICATIONS: Current Outpatient Medications (Ophthalmic Drugs)  Medication Sig  . dorzolamide-timolol (COSOPT) 22.3-6.8 MG/ML ophthalmic solution Place 1 drop into the right eye 2 (two) times daily.  Marland Kitchen neomycin-polymyxin b-dexamethasone (MAXITROL) 3.5-10000-0.1 OINT Place 1 application into the right eye 4 (four) times daily.  . prednisoLONE acetate (PRED FORTE) 1 % ophthalmic suspension Place 1 drop into the right eye 4 (four) times daily.   No current facility-administered medications for this visit. (Ophthalmic Drugs)   Current Outpatient Medications (Other)  Medication Sig  . bacitracin 500 UNIT/GM ointment Apply 1 application topically 2 (two) times daily. (Patient not taking: Reported on 05/19/2020)  . clopidogrel (PLAVIX) 75 MG tablet TAKE (1) TABLET BY MOUTH ONCE DAILY.  . famotidine (PEPCID) 20 MG tablet One daily (Patient taking differently: Take 20 mg by mouth daily. )  . FLUoxetine (PROZAC) 20 MG capsule TAKE 1 CAPSULE  BY MOUTH EVERY MORNING  . irbesartan (AVAPRO) 300 MG tablet Take 1 tablet (300 mg total) by mouth daily.  . Multiple Vitamins-Minerals (MULTIVITAMIN WITH MINERALS) tablet Take 1 tablet by mouth daily.  . simvastatin (ZOCOR) 40 MG tablet Take 40 mg by mouth daily.   . tamsulosin (FLOMAX) 0.4 MG CAPS capsule Take 1 capsule (0.4 mg total) by mouth daily.   No current facility-administered medications for this visit. (Other)      REVIEW OF SYSTEMS: ROS    Positive for: Gastrointestinal, Cardiovascular, Eyes   Negative for: Constitutional, Neurological, Skin, Genitourinary, Musculoskeletal, HENT, Endocrine, Respiratory, Psychiatric, Allergic/Imm, Heme/Lymph   Last edited by Theodore Demark, COA on 06/23/2020  7:58 AM. (History)       ALLERGIES No Known Allergies  PAST MEDICAL HISTORY Past Medical History:  Diagnosis Date  . Bell's palsy   . Cataract    Mixed form OU  . Closed nondisplaced fracture of neck of third metacarpal bone of right hand 02/04/2017  . Clotting disorder ([place])   . Depression   . Diverticulitis   . GERD (gastroesophageal reflux disease)   . Headache(784.0)   . HTN (hypertension)   . Hyperlipidemia   . Hypertensive retinopathy    OU  . Mild CAD 2012  . PVD (peripheral vascular disease) ([place])   . Sexual dysfunction    Past Surgical History:  Procedure Laterality Date  . ABDOMINAL AORTOGRAM W/LOWER EXTREMITY Bilateral 02/05/2020  Procedure: ABDOMINAL AORTOGRAM W/LOWER EXTREMITY;  Surgeon: Elam Dutch, MD;  Location: [facility];  Service: Cardiovascular;  Laterality: Bilateral;  . EYE SURGERY Right 05/06/2020   Pneumatic retinopexy for rheg. RD repair - Dr. Bernarda Caffey  . GAS INSERTION Right 05/26/2020   Procedure: INSERTION OF GAS;  Surgeon: Bernarda Caffey, MD;  Location: [facility];  Service: Ophthalmology;  Laterality: Right;  . MEMBRANE PEEL Right 05/26/2020   Procedure: MEMBRANE PEEL;  Surgeon: Bernarda Caffey, MD;  Location: [facility];   Service: Ophthalmology;  Laterality: Right;  . PARS PLANA VITRECTOMY Right 05/26/2020   Procedure: PARS PLANA VITRECTOMY WITH 25 GAUGE;  Surgeon: Bernarda Caffey, MD;  Location: [facility];  Service: Ophthalmology;  Laterality: Right;  . PHOTOCOAGULATION Right 05/26/2020   Procedure: PHOTOCOAGULATION;  Surgeon: Bernarda Caffey, MD;  Location: [facility];  Service: Ophthalmology;  Laterality: Right;  . PROSTATE BIOPSY  2015  . RETINAL DETACHMENT SURGERY Right 05/06/2020   Pneumatic retinopexy for rheg RD repair - Dr. Bernarda Caffey    FAMILY HISTORY Family History  Problem Relation Age of Onset  . Hypertension Mother   . Deep vein thrombosis Father   . Diabetes Brother   . Tuberculosis Maternal Grandfather   . Emphysema Maternal Grandfather   . Lung disease Brother   . Asthma Son   . Rectal cancer Neg Hx   . Colon cancer Neg Hx     SOCIAL HISTORY Social History   Tobacco Use  . Smoking status: Former Smoker    Packs/day: 1.50    Years: 46.00    Pack years: 69.00    Types: Cigarettes    Quit date: 12/12/2011    Years since quitting: 8.5  . Smokeless tobacco: Never Used  Vaping Use  . Vaping Use: Never used  Substance Use Topics  . Alcohol use: Yes    Alcohol/week: 4.0 standard drinks    Types: 4 Cans of beer per week    Comment: a couple of beer on a weekend  . Drug use: Yes    Types: Cocaine, Marijuana, "Crack" cocaine    Comment: over 25 years ago         OPHTHALMIC EXAM:  Base Eye Exam    Visual Acuity (Snellen - Linear)      Right Left   Dist [place] 20/70 +2 20/30 -2   Dist ph [place] 20/50 20/20 -1       Tonometry (Tonopen, 7:54 AM)      Right Left   Pressure 17 21       Pupils      Dark Light Shape React APD   Right 6 6 Round Dilated None   Left 3 2 Round Brisk None       Visual Fields (Counting fingers)      Left Right    Full Full       Extraocular Movement      Right Left    Full, Ortho Full, Ortho       Neuro/Psych    Oriented x3: Yes   Mood/Affect:  Normal       Dilation    Both eyes: 1.0% Mydriacyl, 2.5% Phenylephrine @ 7:54 AM        Slit Lamp and Fundus Exam    External Exam      Right Left   External periorbital edema Normal       Slit Lamp Exam      Right Left   Lids/Lashes Dermatochalasis - upper lid  Normal   Conjunctiva/Sclera sutures intact, Trace Injection White and quiet   Cornea 3+Punctate epithelial erosions, Debris in tear film trace Punctate epithelial erosions   Anterior Chamber deep, narrow temporal angle, 0.5+ Cell/pigment Deep and quiet   Iris Round and dilated Round and dilated   Lens 2+ Nuclear sclerosis, 2-3+ Cortical cataract, focal cortical wedge at 0600, 1-2+ PC feathering 1+ Nuclear sclerosis, 1+ Cortical cataract   Vitreous post vitrectomy, 50% gas bubble Vitreous syneresis, Posterior vitreous detachment       Fundus Exam      Right Left   Disc Pink and Sharp Pink and Sharp   C/D Ratio 0.5 0.6   Macula flat under gas, ERM gone Flat, Blunted foveal reflex, mild ERM, No heme or edema   Vessels attenuated, mild AV crossing changes, mild Copper wiring attenuated, mild tortuousity   Periphery Attached, good cryo ST quad, good 360 laser changes, ORIGINALLY: Temporal mac off detachment w/ HST at 1030 Attached, HST at 0130 no SRF--good laser changes surrounding, mild inferior paving stone degeneration        Refraction    Wearing Rx      Sphere Cylinder Axis Add   Right +1.00 +1.50 178 +2.00   Left +0.50 +1.25 166 +2.00          IMAGING AND PROCEDURES  Imaging and Procedures for 06/23/2020          ASSESSMENT/PLAN:    ICD-10-CM   1. Right retinal detachment  H33.21   2. Retinal edema  H35.81   3. Epiretinal membrane (ERM) of right eye  H35.371   4. Retinal tear of left eye  H33.312   5. Essential hypertension  I10   6. Hypertensive retinopathy of both eyes  H35.033   7. Combined forms of age-related cataract of both eyes  H25.813     1,2. Rhegmatogenous retinal detachment, OD -  bullous superior mac off detachment, onset of foveal involvement Thurday, 09.23.21 by pt history - detached temporally from 0730 to 11 oclock, fovea off, horse shoe tear at 1030 - s/p pneumatic retinopexy w/ C3F8 OD 09.24.21,  - supplemental intravitreal C3F8 gas injection OD (2.5cc on 10.06.21) **persistent shallow SRF remained due to ERM preventing full reattachment**  - POW4 s/p PPV/TissueBlue stain/MP/EL/FAX/14% C3F8 OD, 10.14.2021             - doing well             - retina attached and ERM gone  - BCVA improved to 20/50 (PH)  - gas bubble 50%             - IOP okay at 17             - continue PF QID OD                         Brimonidine QD OD                         PSO ung QID OD -- decrease to QHS / PRN  - add artificial tears QID             - avoid laying flat on back              - post op drop and positioning instructions reviewed              - tylenol/ibuprofen for pain  - f/u 3-4 weeks, POV,  DFE, OCT   3. Epiretinal membrane, right eye  - +ERM w/ pucker likely impacting RD and SRF OD  - POW4 s/p PPV/TissueBlue stain/MP/EL/FAX/14% C3F8 OD, 10.14.2021 as above - ERM gone - drops as above  4. Retinal tear, OS - horse shoe tear located at 0130 - s/p laser retinopexy OS 09.24.21 - good laser changes in place  5,6. Hypertensive retinopathy OU - discussed importance of tight BP control - monitor  7. Mixed Cataract OU - The symptoms of cataract, surgical options, and treatments and risks were discussed with patient. - discussed diagnosis and progression   Ophthalmic Meds Ordered this visit:  No orders of the defined types were placed in this encounter.     Return for f/u 3-4 weeks, RD OD, POV, DFE, OCT.  There are no Patient Instructions on file for this visit.  This document serves as a record of services personally performed by Gardiner Sleeper, MD, PhD. It was created on their behalf by Leonie Douglas, an ophthalmic technician. The creation of this record  is the provider's dictation and/or activities during the visit.    Electronically signed by: Leonie Douglas COA, 06/23/20  11:24 PM   This document serves as a record of services personally performed by Gardiner Sleeper, MD, PhD. It was created on their behalf by San Jetty. Owens Shark, OA an ophthalmic technician. The creation of this record is the provider's dictation and/or activities during the visit.    Electronically signed by: San Jetty. Owens Shark, [place] 11.11.2021 11:24 PM  Gardiner Sleeper, M.D., Ph.D. Diseases & Surgery of the Retina and Vitreous [facility]  I have reviewed the above documentation for accuracy and completeness, and I agree with the above. Gardiner Sleeper, M.D., Ph.D. 06/23/20 11:24 PM   Abbreviations: M myopia (nearsighted); A astigmatism; H hyperopia (farsighted); P presbyopia; Mrx spectacle prescription;  CTL contact lenses; OD right eye; OS left eye; OU both eyes  XT exotropia; ET esotropia; PEK punctate epithelial keratitis; PEE punctate epithelial erosions; DES dry eye syndrome; MGD meibomian gland dysfunction; ATs artificial tears; PFAT's preservative free artificial tears; [place] nuclear sclerotic cataract; PSC posterior subcapsular cataract; ERM epi-retinal membrane; PVD posterior vitreous detachment; RD retinal detachment; DM diabetes mellitus; DR diabetic retinopathy; NPDR non-proliferative diabetic retinopathy; PDR proliferative diabetic retinopathy; CSME clinically significant macular edema; DME diabetic macular edema; dbh dot blot hemorrhages; CWS cotton wool spot; POAG primary open angle glaucoma; C/D cup-to-disc ratio; HVF humphrey visual field; GVF goldmann visual field; OCT optical coherence tomography; IOP intraocular pressure; BRVO Branch retinal vein occlusion; CRVO central retinal vein occlusion; CRAO central retinal artery occlusion; BRAO branch retinal artery occlusion; RT retinal tear; SB scleral buckle; PPV pars plana vitrectomy; VH Vitreous  hemorrhage; PRP panretinal laser photocoagulation; IVK intravitreal kenalog; VMT vitreomacular traction; MH Macular hole;  NVD neovascularization of the disc; NVE neovascularization elsewhere; AREDS age related eye disease study; ARMD age related macular degeneration; POAG primary open angle glaucoma; EBMD epithelial/anterior basement membrane dystrophy; ACIOL anterior chamber intraocular lens; IOL intraocular lens; PCIOL posterior chamber intraocular lens; Phaco/IOL phacoemulsification with intraocular lens placement; [place] photorefractive keratectomy; LASIK laser assisted in situ keratomileusis; HTN hypertension; DM diabetes mellitus; COPD chronic obstructive pulmonary disease

## 2020-06-23 ENCOUNTER — Encounter (INDEPENDENT_AMBULATORY_CARE_PROVIDER_SITE_OTHER): Payer: Self-pay | Admitting: Ophthalmology

## 2020-06-23 ENCOUNTER — Ambulatory Visit: Payer: PPO | Admitting: Vascular Surgery

## 2020-06-23 ENCOUNTER — Ambulatory Visit (HOSPITAL_COMMUNITY): Payer: PPO

## 2020-06-23 ENCOUNTER — Ambulatory Visit (INDEPENDENT_AMBULATORY_CARE_PROVIDER_SITE_OTHER): Payer: PPO | Admitting: Ophthalmology

## 2020-06-23 ENCOUNTER — Other Ambulatory Visit: Payer: Self-pay

## 2020-06-23 DIAGNOSIS — H35371 Puckering of macula, right eye: Secondary | ICD-10-CM

## 2020-06-23 DIAGNOSIS — H3321 Serous retinal detachment, right eye: Secondary | ICD-10-CM

## 2020-06-23 DIAGNOSIS — H25813 Combined forms of age-related cataract, bilateral: Secondary | ICD-10-CM

## 2020-06-23 DIAGNOSIS — I10 Essential (primary) hypertension: Secondary | ICD-10-CM

## 2020-06-23 DIAGNOSIS — H3581 Retinal edema: Secondary | ICD-10-CM

## 2020-06-23 DIAGNOSIS — H33312 Horseshoe tear of retina without detachment, left eye: Secondary | ICD-10-CM

## 2020-06-23 DIAGNOSIS — H35033 Hypertensive retinopathy, bilateral: Secondary | ICD-10-CM

## 2020-07-05 NOTE — Progress Notes (Signed)
[facility] Note  07/06/2020     CHIEF COMPLAINT Patient presents for Post-op Follow-up   HISTORY OF PRESENT ILLNESS: Todd Weeks is a 64 y.o. male who presents to the clinic today for:  HPI    Post-op Follow-up    In right eye.  Discomfort includes itching and floaters.  Vision is stable, is blurred at distance and is blurred at near.  I, the attending physician,  performed the HPI with the patient and updated documentation appropriately.          Comments    64 y/o male pt here for 13 day f/u for RD OD.  S/p pneumatic retinopexy OD 9.24.21, supplemental C3F8 gas inj OD 10.26.21.  S/p PPV/MP OD 10.14.21.  No change noticed in [place] OU, but over the past two days has been seeing intermittent floaters OD.  Gas bubble also appears not to have moved in past 2 wks.  No pain, FOL.  No problems reported OS.  PF QID OD, Brimonidine QD OD, PSO ung prn OD, AT QID OD.       Last edited by Bernarda Caffey, MD on 07/08/2020  1:36 AM. (History)    Pt feels like he is not doing "enough", he is sleeping with his face down, but says during the day he not doing as much face down as he should, he washed his hair yesterday and then started seeing 2 satellite bubbles, he feels like he is not as far as Rudie as he should be  Referring physician: Vivi Barrack, MD [place],  [place] [id number]  HISTORICAL INFORMATION:   Selected notes from the [facility]: Current Outpatient Medications (Ophthalmic Drugs)  Medication Sig  . neomycin-polymyxin b-dexamethasone (MAXITROL) 3.5-10000-0.1 OINT Place 1 application into the right eye 4 (four) times daily.  . prednisoLONE acetate (PRED FORTE) 1 % ophthalmic suspension Place 1 drop into the right eye 4 (four) times daily.  . dorzolamide-timolol (COSOPT) 22.3-6.8 MG/ML ophthalmic solution Place 1 drop into the right eye 2 (two) times daily. (Patient not taking: Reported on 07/06/2020)   No  current facility-administered medications for this visit. (Ophthalmic Drugs)   Current Outpatient Medications (Other)  Medication Sig  . bacitracin 500 UNIT/GM ointment Apply 1 application topically 2 (two) times daily.  . clopidogrel (PLAVIX) 75 MG tablet TAKE (1) TABLET BY MOUTH ONCE DAILY.  . famotidine (PEPCID) 20 MG tablet One daily (Patient taking differently: Take 20 mg by mouth daily. )  . FLUoxetine (PROZAC) 20 MG capsule TAKE 1 CAPSULE BY MOUTH EVERY MORNING  . FLUZONE HIGH-DOSE QUADRIVALENT 0.7 ML SUSY   . irbesartan (AVAPRO) 300 MG tablet Take 1 tablet (300 mg total) by mouth daily.  . Multiple Vitamins-Minerals (MULTIVITAMIN WITH MINERALS) tablet Take 1 tablet by mouth daily.  Marland Kitchen SHINGRIX injection   . simvastatin (ZOCOR) 40 MG tablet Take 40 mg by mouth daily.   . tamsulosin (FLOMAX) 0.4 MG CAPS capsule Take 1 capsule (0.4 mg total) by mouth daily.   No current facility-administered medications for this visit. (Other)      REVIEW OF SYSTEMS: ROS    Positive for: Gastrointestinal, Cardiovascular, Eyes   Negative for: Constitutional, Neurological, Skin, Genitourinary, Musculoskeletal, HENT, Endocrine, Respiratory, Psychiatric, Allergic/Imm, Heme/Lymph   Last edited by Matthew Folks, COA on 07/06/2020  8:32 AM. (History)       ALLERGIES No Known Allergies  PAST MEDICAL HISTORY Past Medical History:  Diagnosis Date  . Bell's palsy   . Cataract    Mixed form OU  . Closed nondisplaced fracture of neck of third metacarpal bone of right hand 02/04/2017  . Clotting disorder ([place])   . Depression   . Diverticulitis   . GERD (gastroesophageal reflux disease)   . Headache(784.0)   . HTN (hypertension)   . Hyperlipidemia   . Hypertensive retinopathy    OU  . Mild CAD 2012  . PVD (peripheral vascular disease) ([place])   . Sexual dysfunction    Past Surgical History:  Procedure Laterality Date  . ABDOMINAL AORTOGRAM W/LOWER EXTREMITY Bilateral 02/05/2020   Procedure:  ABDOMINAL AORTOGRAM W/LOWER EXTREMITY;  Surgeon: Elam Dutch, MD;  Location: [facility];  Service: Cardiovascular;  Laterality: Bilateral;  . EYE SURGERY Right 05/06/2020   Pneumatic retinopexy for rheg. RD repair - Dr. Bernarda Caffey  . EYE SURGERY Right 05/26/2020   PPV/MP - Dr. Bernarda Caffey  . GAS INSERTION Right 05/26/2020   Procedure: INSERTION OF GAS;  Surgeon: Bernarda Caffey, MD;  Location: [facility];  Service: Ophthalmology;  Laterality: Right;  . MEMBRANE PEEL Right 05/26/2020   Procedure: MEMBRANE PEEL;  Surgeon: Bernarda Caffey, MD;  Location: [facility];  Service: Ophthalmology;  Laterality: Right;  . PARS PLANA VITRECTOMY Right 05/26/2020   Procedure: PARS PLANA VITRECTOMY WITH 25 GAUGE;  Surgeon: Bernarda Caffey, MD;  Location: [facility];  Service: Ophthalmology;  Laterality: Right;  . PHOTOCOAGULATION Right 05/26/2020   Procedure: PHOTOCOAGULATION;  Surgeon: Bernarda Caffey, MD;  Location: [facility];  Service: Ophthalmology;  Laterality: Right;  . PROSTATE BIOPSY  2015  . RETINAL DETACHMENT SURGERY Right 05/06/2020   Pneumatic retinopexy for rheg RD repair - Dr. Bernarda Caffey  . RETINAL DETACHMENT SURGERY Right 05/26/2020   PPV/MP - Dr. Bernarda Caffey    FAMILY HISTORY Family History  Problem Relation Age of Onset  . Hypertension Mother   . Deep vein thrombosis Father   . Diabetes Brother   . Tuberculosis Maternal Grandfather   . Emphysema Maternal Grandfather   . Lung disease Brother   . Asthma Son   . Rectal cancer Neg Hx   . Colon cancer Neg Hx     SOCIAL HISTORY Social History   Tobacco Use  . Smoking status: Former Smoker    Packs/day: 1.50    Years: 46.00    Pack years: 69.00    Types: Cigarettes    Quit date: 12/12/2011    Years since quitting: 8.5  . Smokeless tobacco: Never Used  Vaping Use  . Vaping Use: Never used  Substance Use Topics  . Alcohol use: Yes    Alcohol/week: 4.0 standard drinks    Types: 4 Cans of beer per week    Comment: a couple of beer on  a weekend  . Drug use: Yes    Types: Cocaine, Marijuana, "Crack" cocaine    Comment: over 25 years ago         OPHTHALMIC EXAM:  Base Eye Exam    Visual Acuity (Snellen - Linear)      Right Left   Dist [place] 20/100 -2 20/20 -2   Dist ph [place] 20/40 -2        Tonometry (Tonopen, 8:37 AM)      Right Left   Pressure 18 Def       Pupils      Dark Light Shape React APD   Right 3 2 Round Slow None   Left 3  2 Round Slow None       Visual Fields (Counting fingers)      Left Right    Full Full       Extraocular Movement      Right Left    Full, Ortho Full, Ortho       Neuro/Psych    Oriented x3: Yes   Mood/Affect: Normal       Dilation    Right eye: 1.0% Mydriacyl, 2.5% Phenylephrine @ 8:37 AM        Slit Lamp and Fundus Exam    External Exam      Right Left   External periorbital edema Normal       Slit Lamp Exam      Right Left   Lids/Lashes Dermatochalasis - upper lid Normal   Conjunctiva/Sclera sutures intact, Trace Injection White and quiet   Cornea 3+Punctate epithelial erosions, Debris in tear film trace Punctate epithelial erosions   Anterior Chamber deep, narrow temporal angle, 0.5+ Cell/pigment Deep and quiet   Iris Round and dilated Round and dilated   Lens 2-3+ Nuclear sclerosis with brunescence, 2-3+ Cortical cataract, focal cortical wedge at 0600, trace Posterior subcapsular cataract 1+ Nuclear sclerosis, 1+ Cortical cataract   Vitreous post vitrectomy, 35% gas bubble, vitreous condensations Vitreous syneresis, Posterior vitreous detachment       Fundus Exam      Right Left   Disc Pink and Sharp Pink and Sharp   C/D Ratio 0.6 0.6   Macula Flat; ERM gone; mild RPE mottling Flat, Blunted foveal reflex, mild ERM, No heme or edema   Vessels attenuated, mild tortuousity attenuated, mild tortuousity   Periphery Attached, good cryo ST quad, good 360 laser changes, ORIGINALLY: Temporal mac off detachment w/ HST at 1030 Attached, HST at 0130 no SRF--good  laser changes surrounding, mild inferior paving stone degeneration          IMAGING AND PROCEDURES  Imaging and Procedures for 07/06/2020  OCT, Retina - OU - Both Eyes       Right Eye Quality was good. Central Foveal Thickness: 301. Progression has improved. Findings include subretinal fluid, abnormal foveal contour, epiretinal membrane, no IRF, macular pucker (ERM gone and retinal thickening improved; residual trace/shallow SRF -- improved from pre-op).   Left Eye Quality was good. Central Foveal Thickness: 287. Progression has been stable. Findings include normal foveal contour, no IRF, no SRF, epiretinal membrane.   Notes *Images captured and stored on drive  Diagnosis / Impression:  OD: ERM gone and retinal thickening improved; residual trace/shallow SRF -- improved from pre-op OS: NFP, no IRF/SRF  Clinical management:  See below  Abbreviations: NFP - Normal foveal profile. CME - cystoid macular edema. PED - pigment epithelial detachment. IRF - intraretinal fluid. SRF - subretinal fluid. EZ - ellipsoid zone. ERM - epiretinal membrane. ORA - outer retinal atrophy. ORT - outer retinal tubulation. SRHM - subretinal hyper-reflective material. IRHM - intraretinal hyper-reflective material                ASSESSMENT/PLAN:    ICD-10-CM   1. Right retinal detachment  H33.21   2. Retinal edema  H35.81 OCT, Retina - OU - Both Eyes  3. Epiretinal membrane (ERM) of right eye  H35.371   4. Retinal tear of left eye  H33.312   5. Essential hypertension  I10   6. Hypertensive retinopathy of both eyes  H35.033   7. Combined forms of age-related cataract of both eyes  H25.813  1,2. Rhegmatogenous retinal detachment, OD - bullous superior mac off detachment, onset of foveal involvement Thurday, 09.23.21 by pt history - detached temporally from 0730 to 11 oclock, fovea off, horse shoe tear at 1030 - s/p pneumatic retinopexy w/ C3F8 OD 09.24.21,  - supplemental intravitreal  C3F8 gas injection OD (2.5cc on 10.06.21) **persistent shallow SRF remained due to ERM preventing full reattachment**  - POW4 s/p PPV/TissueBlue stain/MP/EL/FAX/14% C3F8 OD, 10.14.2021  - ERM gone, but tr shallow residual SRF persists  - BCVA improved to 20/40 (PH) from 20/50  - gas bubble 35%             - IOP okay at 18  - OCT shows ERM gone and retinal thickening improved; residual trace/shallow SRF -- improved from pre-op  ** pt reports reduced compliance with face down positioning and post op activity restrictions **             - continue  PF QID OD                          Brimonidine QD OD    PSO ung QHS / PRN    artificial tears QID OD             - avoid laying flat on back              - post op drop and positioning instructions reviewed              - tylenol/ibuprofen for pain  - f/u as scheduled on December 9, POV, DFE, OCT   3. Epiretinal membrane, right eye  - +ERM w/ pucker likely impacting RD and SRF OD  - POW4 s/p PPV/TissueBlue stain/MP/EL/FAX/14% C3F8 OD, 10.14.2021 as above - ERM gone  - OCT shows ERM gone and retinal thickening improved; residual trace/shallow SRF -- improved from pre-op - drops as above  4. Retinal tear, OS - horse shoe tear located at 0130 - s/p laser retinopexy OS 09.24.21 - good laser changes in place  5,6. Hypertensive retinopathy OU - discussed importance of tight BP control - monitor  7. Mixed Cataract OU - The symptoms of cataract, surgical options, and treatments and risks were discussed with patient. - discussed diagnosis and progression   Ophthalmic Meds Ordered this visit:  No orders of the defined types were placed in this encounter.     Return for f/u as schedule on December 9.  There are no Patient Instructions on file for this visit.  This document serves as a record of services personally performed by Gardiner Sleeper, MD, PhD. It was created on their behalf by San Jetty. Owens Shark, OA an ophthalmic technician. The  creation of this record is the provider's dictation and/or activities during the visit.    Electronically signed by: San Jetty. Owens Shark, [place] 11.23.2021 1:42 AM  Gardiner Sleeper, M.D., Ph.D. Diseases & Surgery of the Retina and Vitreous [facility]  I have reviewed the above documentation for accuracy and completeness, and I agree with the above. Gardiner Sleeper, M.D., Ph.D. 07/08/20 1:42 AM  Abbreviations: M myopia (nearsighted); A astigmatism; H hyperopia (farsighted); P presbyopia; Mrx spectacle prescription;  CTL contact lenses; OD right eye; OS left eye; OU both eyes  XT exotropia; ET esotropia; PEK punctate epithelial keratitis; PEE punctate epithelial erosions; DES dry eye syndrome; MGD meibomian gland dysfunction; ATs artificial tears; PFAT's preservative free artificial tears; [employer] nuclear sclerotic cataract; [place]  posterior subcapsular cataract; ERM epi-retinal membrane; PVD posterior vitreous detachment; RD retinal detachment; DM diabetes mellitus; DR diabetic retinopathy; NPDR non-proliferative diabetic retinopathy; PDR proliferative diabetic retinopathy; CSME clinically significant macular edema; DME diabetic macular edema; dbh dot blot hemorrhages; CWS cotton wool spot; POAG primary open angle glaucoma; C/D cup-to-disc ratio; HVF humphrey visual field; GVF goldmann visual field; OCT optical coherence tomography; IOP intraocular pressure; BRVO Branch retinal vein occlusion; CRVO central retinal vein occlusion; CRAO central retinal artery occlusion; BRAO branch retinal artery occlusion; RT retinal tear; SB scleral buckle; PPV pars plana vitrectomy; VH Vitreous hemorrhage; PRP panretinal laser photocoagulation; IVK intravitreal kenalog; VMT vitreomacular traction; MH Macular hole;  NVD neovascularization of the disc; NVE neovascularization elsewhere; AREDS age related eye disease study; ARMD age related macular degeneration; POAG primary open angle glaucoma; EBMD  epithelial/anterior basement membrane dystrophy; ACIOL anterior chamber intraocular lens; IOL intraocular lens; PCIOL posterior chamber intraocular lens; Phaco/IOL phacoemulsification with intraocular lens placement; [place] photorefractive keratectomy; LASIK laser assisted in situ keratomileusis; HTN hypertension; DM diabetes mellitus; COPD chronic obstructive pulmonary disease

## 2020-07-06 ENCOUNTER — Telehealth: Payer: Self-pay | Admitting: Family Medicine

## 2020-07-06 ENCOUNTER — Ambulatory Visit (INDEPENDENT_AMBULATORY_CARE_PROVIDER_SITE_OTHER): Payer: PPO | Admitting: Ophthalmology

## 2020-07-06 ENCOUNTER — Encounter (INDEPENDENT_AMBULATORY_CARE_PROVIDER_SITE_OTHER): Payer: Self-pay | Admitting: Ophthalmology

## 2020-07-06 ENCOUNTER — Other Ambulatory Visit: Payer: Self-pay

## 2020-07-06 DIAGNOSIS — H3321 Serous retinal detachment, right eye: Secondary | ICD-10-CM

## 2020-07-06 DIAGNOSIS — H3581 Retinal edema: Secondary | ICD-10-CM | POA: Diagnosis not present

## 2020-07-06 DIAGNOSIS — H35371 Puckering of macula, right eye: Secondary | ICD-10-CM

## 2020-07-06 DIAGNOSIS — H25813 Combined forms of age-related cataract, bilateral: Secondary | ICD-10-CM

## 2020-07-06 DIAGNOSIS — H33312 Horseshoe tear of retina without detachment, left eye: Secondary | ICD-10-CM

## 2020-07-06 DIAGNOSIS — H35033 Hypertensive retinopathy, bilateral: Secondary | ICD-10-CM | POA: Diagnosis not present

## 2020-07-06 DIAGNOSIS — I10 Essential (primary) hypertension: Secondary | ICD-10-CM

## 2020-07-06 NOTE — Telephone Encounter (Signed)
Patient has been scheduled

## 2020-07-06 NOTE — Telephone Encounter (Signed)
Left message for patient to call back and schedule Medicare Annual Wellness Visit (AWV) either virtually OR in office.    Last AWV; 06/03/2019 please schedule at anytime with [facility]-Nurse Health Advisor at [facility]    This should be a 45 minute visit.

## 2020-07-08 ENCOUNTER — Encounter (INDEPENDENT_AMBULATORY_CARE_PROVIDER_SITE_OTHER): Payer: Self-pay | Admitting: Ophthalmology

## 2020-07-14 NOTE — Progress Notes (Signed)
[facility] Note  07/21/2020     CHIEF COMPLAINT Patient presents for Post-op Follow-up   HISTORY OF PRESENT ILLNESS: Todd Weeks is a 64 y.o. male who presents to the clinic today for:  HPI    Post-op Follow-up    In right eye.  Vision is improved.  I, the attending physician,  performed the HPI with the patient and updated documentation appropriately.          Comments    Pt states his vision is slightly improved OD--patient states he is unhappy with vision OD.  Patient denies eye pain or discomfort and denies any new or worsening floaters or fol OU.       Last edited by Bernarda Caffey, MD on 07/21/2020  9:13 AM. (History)    Pt states the gas bubble seems a little smaller  Referring physician: Vivi Barrack, MD [place],  [place] [address]  HISTORICAL INFORMATION:   Selected notes from the MEDICAL RECORD NUMBER    CURRENT MEDICATIONS: Current Outpatient Medications (Ophthalmic Drugs)  Medication Sig  . dorzolamide-timolol (COSOPT) 22.3-6.8 MG/ML ophthalmic solution Place 1 drop into the right eye 2 (two) times daily. (Patient not taking: Reported on 07/06/2020)  . neomycin-polymyxin b-dexamethasone (MAXITROL) 3.5-10000-0.1 OINT Place 1 application into the right eye 4 (four) times daily.  . prednisoLONE acetate (PRED FORTE) 1 % ophthalmic suspension Place 1 drop into the right eye 4 (four) times daily.   No current facility-administered medications for this visit. (Ophthalmic Drugs)   Current Outpatient Medications (Other)  Medication Sig  . bacitracin 500 UNIT/GM ointment Apply 1 application topically 2 (two) times daily.  . clopidogrel (PLAVIX) 75 MG tablet TAKE (1) TABLET BY MOUTH ONCE DAILY.  . famotidine (PEPCID) 20 MG tablet One daily (Patient taking differently: Take 20 mg by mouth daily. )  . FLUoxetine (PROZAC) 20 MG capsule TAKE 1 CAPSULE BY MOUTH EVERY MORNING  . FLUZONE HIGH-DOSE QUADRIVALENT 0.7 ML SUSY   .  irbesartan (AVAPRO) 300 MG tablet Take 1 tablet (300 mg total) by mouth daily.  . Multiple Vitamins-Minerals (MULTIVITAMIN WITH MINERALS) tablet Take 1 tablet by mouth daily.  Marland Kitchen SHINGRIX injection   . simvastatin (ZOCOR) 40 MG tablet Take 40 mg by mouth daily.   . tamsulosin (FLOMAX) 0.4 MG CAPS capsule Take 1 capsule (0.4 mg total) by mouth daily.   No current facility-administered medications for this visit. (Other)      REVIEW OF SYSTEMS: ROS    Positive for: Gastrointestinal, Cardiovascular, Eyes   Negative for: Constitutional, Neurological, Skin, Genitourinary, Musculoskeletal, HENT, Endocrine, Respiratory, Psychiatric, Allergic/Imm, Heme/Lymph   Last edited by Doneen Poisson on 07/21/2020  7:47 AM. (History)       ALLERGIES No Known Allergies  PAST MEDICAL HISTORY Past Medical History:  Diagnosis Date  . Bell's palsy   . Cataract    Mixed form OU  . Closed nondisplaced fracture of neck of third metacarpal bone of right hand 02/04/2017  . Clotting disorder ([place])   . Depression   . Diverticulitis   . GERD (gastroesophageal reflux disease)   . Headache(784.0)   . HTN (hypertension)   . Hyperlipidemia   . Hypertensive retinopathy    OU  . Mild CAD 2012  . PVD (peripheral vascular disease) ([place])   . Sexual dysfunction    Past Surgical History:  Procedure Laterality Date  . ABDOMINAL AORTOGRAM W/LOWER EXTREMITY Bilateral 02/05/2020   Procedure: ABDOMINAL AORTOGRAM W/LOWER EXTREMITY;  Surgeon: Elam Dutch, MD;  Location: [facility];  Service: Cardiovascular;  Laterality: Bilateral;  . EYE SURGERY Right 05/06/2020   Pneumatic retinopexy for rheg. RD repair - Dr. Bernarda Caffey  . EYE SURGERY Right 05/26/2020   PPV/MP - Dr. Bernarda Caffey  . GAS INSERTION Right 05/26/2020   Procedure: INSERTION OF GAS;  Surgeon: Bernarda Caffey, MD;  Location: [facility];  Service: Ophthalmology;  Laterality: Right;  . MEMBRANE PEEL Right 05/26/2020   Procedure: MEMBRANE PEEL;   Surgeon: Bernarda Caffey, MD;  Location: [facility];  Service: Ophthalmology;  Laterality: Right;  . PARS PLANA VITRECTOMY Right 05/26/2020   Procedure: PARS PLANA VITRECTOMY WITH 25 GAUGE;  Surgeon: Bernarda Caffey, MD;  Location: [facility];  Service: Ophthalmology;  Laterality: Right;  . PHOTOCOAGULATION Right 05/26/2020   Procedure: PHOTOCOAGULATION;  Surgeon: Bernarda Caffey, MD;  Location: [facility];  Service: Ophthalmology;  Laterality: Right;  . PROSTATE BIOPSY  2015  . RETINAL DETACHMENT SURGERY Right 05/06/2020   Pneumatic retinopexy for rheg RD repair - Dr. Bernarda Caffey  . RETINAL DETACHMENT SURGERY Right 05/26/2020   PPV/MP - Dr. Bernarda Caffey    FAMILY HISTORY Family History  Problem Relation Age of Onset  . Hypertension Mother   . Deep vein thrombosis Father   . Diabetes Brother   . Tuberculosis Maternal Grandfather   . Emphysema Maternal Grandfather   . Lung disease Brother   . Asthma Son   . Rectal cancer Neg Hx   . Colon cancer Neg Hx     SOCIAL HISTORY Social History   Tobacco Use  . Smoking status: Former Smoker    Packs/day: 1.50    Years: 46.00    Pack years: 69.00    Types: Cigarettes    Quit date: 12/12/2011    Years since quitting: 8.6  . Smokeless tobacco: Never Used  Vaping Use  . Vaping Use: Never used  Substance Use Topics  . Alcohol use: Yes    Alcohol/week: 4.0 standard drinks    Types: 4 Cans of beer per week    Comment: a couple of beer on a weekend  . Drug use: Yes    Types: Cocaine, Marijuana, "Crack" cocaine    Comment: over 25 years ago         OPHTHALMIC EXAM:  Base Eye Exam    Visual Acuity (Snellen - Linear)      Right Left   Dist [place] 20/100 +2 20/25 -2   Dist ph [place] 20/60 -2 20/20 -2       Tonometry (Tonopen, 7:51 AM)      Right Left   Pressure 17 13       Pupils      Dark Light Shape React APD   Right 7 7 Round Dilated 0   Left 3 2 Round Brisk 0       Extraocular Movement      Right Left    Full Full       Neuro/Psych     Oriented x3: Yes   Mood/Affect: Normal       Dilation    Right eye: 1.0% Mydriacyl, 2.5% Phenylephrine @ 7:51 AM        Slit Lamp and Fundus Exam    External Exam      Right Left   External periorbital edema Normal       Slit Lamp Exam      Right Left   Lids/Lashes Dermatochalasis - upper lid Normal  Conjunctiva/Sclera White and quiet White and quiet   Cornea Trace Punctate epithelial erosions, Debris in tear film trace Punctate epithelial erosions   Anterior Chamber deep, narrow temporal angle, 0.5+ Cell/pigment Deep and quiet   Iris Round and dilated Round and dilated   Lens 3+ Nuclear sclerosis with brunescence, 2-3+ Cortical cataract, focal cortical wedge at 0600, 1+Posterior subcapsular cataract 1+ Nuclear sclerosis, 1+ Cortical cataract   Vitreous post vitrectomy, 15-20% gas bubble, vitreous condensations Vitreous syneresis, Posterior vitreous detachment       Fundus Exam      Right Left   Disc Pink and Sharp, +cupping Pink and Sharp   C/D Ratio 0.6 0.6   Macula Flat; blunted foveal reflex, ERM gone; mild RPE mottling, scattered MA Flat, Blunted foveal reflex, mild ERM, No heme or edema   Vessels attenuated, mild tortuousity attenuated, mild tortuousity   Periphery Attached, good cryo ST quad, good 360 laser changes, ORIGINALLY: Temporal mac off detachment w/ HST at 1030 Attached, HST at 0130 no SRF--good laser changes surrounding, mild inferior paving stone degeneration        Refraction    Wearing Rx      Sphere Cylinder Axis Add   Right +1.00 +1.50 178 +2.00   Left +0.50 +1.25 166 +2.00          IMAGING AND PROCEDURES  Imaging and Procedures for 07/21/2020  OCT, Retina - OU - Both Eyes       Right Eye Quality was good. Central Foveal Thickness: 287. Progression has improved. Findings include subretinal fluid, abnormal foveal contour, no IRF (ERM gone and retinal thickening improved; trace interval improvement in low lying SRF greatest inferior macula).    Left Eye Quality was good. Central Foveal Thickness: 281. Progression has been stable. Findings include normal foveal contour, no IRF, no SRF, epiretinal membrane.   Notes *Images captured and stored on drive  Diagnosis / Impression:  OD: ERM gone and retinal thickening improved; trace interval improvement in low lying SRF greatest inferior macula OS: NFP, no IRF/SRF  Clinical management:  See below  Abbreviations: NFP - Normal foveal profile. CME - cystoid macular edema. PED - pigment epithelial detachment. IRF - intraretinal fluid. SRF - subretinal fluid. EZ - ellipsoid zone. ERM - epiretinal membrane. ORA - outer retinal atrophy. ORT - outer retinal tubulation. SRHM - subretinal hyper-reflective material. IRHM - intraretinal hyper-reflective material                ASSESSMENT/PLAN:    ICD-10-CM   1. Right retinal detachment  H33.21   2. Retinal edema  H35.81 OCT, Retina - OU - Both Eyes  3. Epiretinal membrane (ERM) of right eye  H35.371   4. Retinal tear of left eye  H33.312   5. Essential hypertension  I10   6. Hypertensive retinopathy of both eyes  H35.033   7. Combined forms of age-related cataract of both eyes  H25.813     1,2. Rhegmatogenous retinal detachment, OD - bullous superior mac off detachment, onset of foveal involvement Thurday, 09.23.21 by pt history - detached temporally from 0730 to 11 oclock, fovea off, horse shoe tear at 1030 - s/p pneumatic retinopexy w/ C3F8 OD 09.24.21 - supplemental intravitreal C3F8 gas injection OD (2.5cc on 10.06.21) **persistent shallow SRF remained due to ERM preventing full reattachment**  - POW4 s/p PPV/TissueBlue stain/MP/EL/FAX/14% C3F8 OD, 10.14.2021  - ERM gone, but tr shallow residual pockets of SRF persist  - BCVA decreased to 20/60 (PH) from 20/40 -- ?cataract  progression  - gas bubble 15-20%             - IOP okay at 17  - OCT shows ERM gone and retinal thickening improved; residual trace/shallow pockets of  SRF -- improved from pre-op  ** pt reports reduced compliance with face down positioning and post op activity restrictions **             - continue  PF QID OD                          Brimonidine QD OD    PSO ung QHS / PRN -- okay to stop  - start artificial tears QID OD             - avoid laying flat on back              - post op drop and positioning instructions reviewed              - tylenol/ibuprofen for pain  - f/u 3-4 weeks, POV, DFE, OCT   3. Epiretinal membrane, right eye  - +ERM w/ pucker likely impacting RD and SRF OD  - POW4 s/p PPV/TissueBlue stain/MP/EL/FAX/14% C3F8 OD, 10.14.2021 as above - ERM gone  - OCT shows ERM gone and retinal thickening improved; residual trace/shallow SRF -- improved from pre-op - drops as above  4. Retinal tear, OS - horse shoe tear located at 0130 - s/p laser retinopexy OS 09.24.21 - good laser changes in place  5,6. Hypertensive retinopathy OU - discussed importance of tight BP control - monitor  7. Mixed Cataract OU - The symptoms of cataract, surgical options, and treatments and risks were discussed with patient. - discussed diagnosis and progression    Ophthalmic Meds Ordered this visit:  No orders of the defined types were placed in this encounter.     Return for f/u 3-4 weeks, RD OD, DFE, OCT.  There are no Patient Instructions on file for this visit.  This document serves as a record of services personally performed by Gardiner Sleeper, MD, PhD. It was created on their behalf by Leonie Douglas, an ophthalmic technician. The creation of this record is the provider's dictation and/or activities during the visit.    Electronically signed by: Leonie Douglas COA, 07/21/20  9:15 AM   This document serves as a record of services personally performed by Gardiner Sleeper, MD, PhD. It was created on their behalf by San Jetty. Owens Shark, OA an ophthalmic technician. The creation of this record is the provider's dictation and/or activities  during the visit.    Electronically signed by: San Jetty. Owens Shark, [place] 12.09.2021 9:15 AM   Gardiner Sleeper, M.D., Ph.D. Diseases & Surgery of the Retina and Vitreous [facility]  I have reviewed the above documentation for accuracy and completeness, and I agree with the above. Gardiner Sleeper, M.D., Ph.D. 07/21/20 9:15 AM  Abbreviations: M myopia (nearsighted); A astigmatism; H hyperopia (farsighted); P presbyopia; Mrx spectacle prescription;  CTL contact lenses; OD right eye; OS left eye; OU both eyes  XT exotropia; ET esotropia; PEK punctate epithelial keratitis; PEE punctate epithelial erosions; DES dry eye syndrome; MGD meibomian gland dysfunction; ATs artificial tears; PFAT's preservative free artificial tears; [place] nuclear sclerotic cataract; PSC posterior subcapsular cataract; ERM epi-retinal membrane; PVD posterior vitreous detachment; RD retinal detachment; DM diabetes mellitus; DR diabetic retinopathy; NPDR non-proliferative diabetic retinopathy; PDR proliferative diabetic retinopathy; CSME  clinically significant macular edema; DME diabetic macular edema; dbh dot blot hemorrhages; CWS cotton wool spot; POAG primary open angle glaucoma; C/D cup-to-disc ratio; HVF humphrey visual field; GVF goldmann visual field; OCT optical coherence tomography; IOP intraocular pressure; BRVO Branch retinal vein occlusion; CRVO central retinal vein occlusion; CRAO central retinal artery occlusion; BRAO branch retinal artery occlusion; RT retinal tear; SB scleral buckle; PPV pars plana vitrectomy; VH Vitreous hemorrhage; PRP panretinal laser photocoagulation; IVK intravitreal kenalog; VMT vitreomacular traction; MH Macular hole;  NVD neovascularization of the disc; NVE neovascularization elsewhere; AREDS age related eye disease study; ARMD age related macular degeneration; POAG primary open angle glaucoma; EBMD epithelial/anterior basement membrane dystrophy; ACIOL anterior chamber intraocular  lens; IOL intraocular lens; PCIOL posterior chamber intraocular lens; Phaco/IOL phacoemulsification with intraocular lens placement; [place] photorefractive keratectomy; LASIK laser assisted in situ keratomileusis; HTN hypertension; DM diabetes mellitus; COPD chronic obstructive pulmonary disease

## 2020-07-21 ENCOUNTER — Encounter (INDEPENDENT_AMBULATORY_CARE_PROVIDER_SITE_OTHER): Payer: Self-pay | Admitting: Ophthalmology

## 2020-07-21 ENCOUNTER — Ambulatory Visit (INDEPENDENT_AMBULATORY_CARE_PROVIDER_SITE_OTHER): Payer: PPO | Admitting: Ophthalmology

## 2020-07-21 ENCOUNTER — Other Ambulatory Visit: Payer: Self-pay

## 2020-07-21 DIAGNOSIS — H35371 Puckering of macula, right eye: Secondary | ICD-10-CM

## 2020-07-21 DIAGNOSIS — H35033 Hypertensive retinopathy, bilateral: Secondary | ICD-10-CM

## 2020-07-21 DIAGNOSIS — I10 Essential (primary) hypertension: Secondary | ICD-10-CM

## 2020-07-21 DIAGNOSIS — H25813 Combined forms of age-related cataract, bilateral: Secondary | ICD-10-CM

## 2020-07-21 DIAGNOSIS — H3581 Retinal edema: Secondary | ICD-10-CM

## 2020-07-21 DIAGNOSIS — H3321 Serous retinal detachment, right eye: Secondary | ICD-10-CM

## 2020-07-21 DIAGNOSIS — H33312 Horseshoe tear of retina without detachment, left eye: Secondary | ICD-10-CM

## 2020-07-25 ENCOUNTER — Ambulatory Visit (INDEPENDENT_AMBULATORY_CARE_PROVIDER_SITE_OTHER): Payer: PPO

## 2020-07-25 ENCOUNTER — Other Ambulatory Visit: Payer: Self-pay

## 2020-07-25 VITALS — BP 136/82 | HR 71 | Temp 98.2°F | Wt 276.6 lb

## 2020-07-25 DIAGNOSIS — Z0000 Encounter for general adult medical examination without abnormal findings: Secondary | ICD-10-CM | POA: Diagnosis not present

## 2020-07-25 NOTE — Progress Notes (Signed)
Subjective:   Todd Weeks is a 64 y.o. male who presents for Medicare Annual/Subsequent preventive examination.  Review of Systems         Objective:    Today's Vitals   07/25/20 0809  BP: 136/82  Pulse: 71  Temp: 98.2 F (36.8 C)  SpO2: 98%  Weight: 276 lb 9.6 oz (125.5 kg)   Body mass index is 37.51 kg/m.  Advanced Directives 07/25/2020 05/26/2020 03/25/2020 02/08/2020 02/05/2020 06/03/2019 04/26/2018  Does Patient Have a Medical Advance Directive? No No No No No No -  Would patient like information on creating a medical advance directive? Yes (MAU/Ambulatory/Procedural Areas - Information given) No - Patient declined - No - Patient declined No - Patient declined Yes (MAU/Ambulatory/Procedural Areas - Information given) No - Patient declined    Current Medications (verified) Outpatient Encounter Medications as of 07/25/2020  Medication Sig  . bacitracin 500 UNIT/GM ointment Apply 1 application topically 2 (two) times daily.  . brimonidine (ALPHAGAN) 0.2 % ophthalmic solution 3 (three) times daily.  . clopidogrel (PLAVIX) 75 MG tablet TAKE (1) TABLET BY MOUTH ONCE DAILY.  . famotidine (PEPCID) 20 MG tablet One daily (Patient taking differently: Take 20 mg by mouth daily.)  . FLUoxetine (PROZAC) 20 MG capsule TAKE 1 CAPSULE BY MOUTH EVERY MORNING  . FLUZONE HIGH-DOSE QUADRIVALENT 0.7 ML SUSY   . irbesartan (AVAPRO) 300 MG tablet Take 1 tablet (300 mg total) by mouth daily.  . Multiple Vitamins-Minerals (MULTIVITAMIN WITH MINERALS) tablet Take 1 tablet by mouth daily.  . prednisoLONE acetate (PRED FORTE) 1 % ophthalmic suspension Place 1 drop into the right eye 4 (four) times daily.  Marland Kitchen SHINGRIX injection   . simvastatin (ZOCOR) 40 MG tablet Take 40 mg by mouth daily.   . tamsulosin (FLOMAX) 0.4 MG CAPS capsule Take 1 capsule (0.4 mg total) by mouth daily.  Marland Kitchen atorvastatin (LIPITOR) 40 MG tablet TAKE 1 TABLET BY MOUTHSONCE A DAY.N (Patient not taking: Reported on 07/25/2020)   . dorzolamide-timolol (COSOPT) 22.3-6.8 MG/ML ophthalmic solution Place 1 drop into the right eye 2 (two) times daily. (Patient not taking: No sig reported)  . neomycin-polymyxin b-dexamethasone (MAXITROL) 3.5-10000-0.1 OINT Place 1 application into the right eye 4 (four) times daily. (Patient not taking: Reported on 07/25/2020)   No facility-administered encounter medications on file as of 07/25/2020.    Allergies (verified) Patient has no known allergies.   History: Past Medical History:  Diagnosis Date  . Bell's palsy   . Cataract    Mixed form OU  . Closed nondisplaced fracture of neck of third metacarpal bone of right hand 02/04/2017  . Clotting disorder ([place])   . Depression   . Diverticulitis   . GERD (gastroesophageal reflux disease)   . Headache(784.0)   . HTN (hypertension)   . Hyperlipidemia   . Hypertensive retinopathy    OU  . Mild CAD 2012  . PVD (peripheral vascular disease) ([place])   . Sexual dysfunction    Past Surgical History:  Procedure Laterality Date  . ABDOMINAL AORTOGRAM W/LOWER EXTREMITY Bilateral 02/05/2020   Procedure: ABDOMINAL AORTOGRAM W/LOWER EXTREMITY;  Surgeon: Elam Dutch, MD;  Location: [facility];  Service: Cardiovascular;  Laterality: Bilateral;  . EYE SURGERY Right 05/06/2020   Pneumatic retinopexy for rheg. RD repair - Dr. Bernarda Caffey  . EYE SURGERY Right 05/26/2020   PPV/MP - Dr. Bernarda Caffey  . GAS INSERTION Right 05/26/2020   Procedure: INSERTION OF GAS;  Surgeon: Bernarda Caffey, MD;  Location: [facility];  Service: Ophthalmology;  Laterality: Right;  . MEMBRANE PEEL Right 05/26/2020   Procedure: MEMBRANE PEEL;  Surgeon: Bernarda Caffey, MD;  Location: [facility];  Service: Ophthalmology;  Laterality: Right;  . PARS PLANA VITRECTOMY Right 05/26/2020   Procedure: PARS PLANA VITRECTOMY WITH 25 GAUGE;  Surgeon: Bernarda Caffey, MD;  Location: [facility];  Service: Ophthalmology;  Laterality: Right;  . PHOTOCOAGULATION Right 05/26/2020    Procedure: PHOTOCOAGULATION;  Surgeon: Bernarda Caffey, MD;  Location: [facility];  Service: Ophthalmology;  Laterality: Right;  . PROSTATE BIOPSY  2015  . RETINAL DETACHMENT SURGERY Right 05/06/2020   Pneumatic retinopexy for rheg RD repair - Dr. Bernarda Caffey  . RETINAL DETACHMENT SURGERY Right 05/26/2020   PPV/MP - Dr. Bernarda Caffey   Family History  Problem Relation Age of Onset  . Hypertension Mother   . Deep vein thrombosis Father   . Diabetes Brother   . Tuberculosis Maternal Grandfather   . Emphysema Maternal Grandfather   . Lung disease Brother   . Asthma Son   . Rectal cancer Neg Hx   . Colon cancer Neg Hx    Social History   Socioeconomic History  . Marital status: Single    Spouse name: Not on file  . Number of children: 2  . Years of education: 66  . Highest education level: Not on file  Occupational History  . Occupation: Electrical engineer  . Occupation: Unemployed    Comment: disability  Tobacco Use  . Smoking status: Former Smoker    Packs/day: 1.50    Years: 46.00    Pack years: 69.00    Types: Cigarettes    Quit date: 12/12/2011    Years since quitting: 8.6  . Smokeless tobacco: Never Used  Vaping Use  . Vaping Use: Never used  Substance and Sexual Activity  . Alcohol use: Yes    Alcohol/week: 4.0 standard drinks    Types: 4 Cans of beer per week    Comment: a couple of beer on a weekend  . Drug use: Not Currently    Types: Cocaine, Marijuana, "Crack" cocaine    Comment: over 25 years ago  . Sexual activity: Not on file  Other Topics Concern  . Not on file  Social History Narrative   Raised in [place], [place]. Does not have any religious beliefs that would effect healthcare. Lives in house with sister. Likes to ride motorcycle for fun.    Moving out on his own; in the next couple of weeks will be moving back home that he had rented      [place]; with a helmet   Social Determinants of Health   Financial Resource Strain: [place]   . Difficulty of  Paying Living Expenses: Not hard at all  Food Insecurity: No Food Insecurity  . Worried About Charity fundraiser in the Last Year: Never true  . Ran Out of Food in the Last Year: Never true  Transportation Needs: No Transportation Needs  . Lack of Transportation (Medical): No  . Lack of Transportation (Non-Medical): No  Physical Activity: Inactive  . Days of Exercise per Week: 0 days  . Minutes of Exercise per Session: 0 min  Stress: Stress Concern Present  . Feeling of Stress : Rather much  Social Connections: Socially Isolated  . Frequency of Communication with Friends and Family: More than three times a week  . Frequency of Social Gatherings with Friends and Family: Once a week  . Attends Religious Services:  Never  . Active Member of Clubs or Organizations: No  . Attends Club or Organization Meetings: Never  . Marital Status: Never married    Tobacco Counseling Counseling given: Not Answered   Clinical Intake:  Pre-visit preparation completed: Yes  Pain : No/denies pain     BMI - recorded: 37.51 Nutritional Status: BMI > 30  Obese Nutritional Risks: None Diabetes: No  How often do you need to have someone help you when you read instructions, pamphlets, or other written materials from your doctor or pharmacy?: 1 - Never  Diabetic?No  Interpreter Needed?: No  Information entered by :: Charlott Rakes, LPN   Activities of Daily Living In your present state of health, do you have any difficulty performing the following activities: 07/25/2020 05/26/2020  Hearing? N N  Vision? Y N  Comment retinal detachment history -  Difficulty concentrating or making decisions? Y N  Walking or climbing stairs? N N  Dressing or bathing? N N  Doing errands, shopping? N -  Preparing Food and eating ? N -  Using the Toilet? N -  In the past six months, have you accidently leaked urine? N -  Do you have problems with loss of bowel control? N -  Managing your Medications? N -   Managing your Finances? N -  Housekeeping or managing your Housekeeping? N -  Some recent data might be hidden    Patient Care Team: Vivi Barrack, MD as PCP - General (Family Medicine) Burnell Blanks, MD as PCP - Cardiology (Cardiology) Burnell Blanks, MD as Consulting Physician (Cardiology) Elam Dutch, MD as Consulting Physician (Vascular Surgery) Deneise Lever, MD as Consulting Physician (Pulmonary Disease) Franchot Gallo, MD as Consulting Physician (Urology)  Indicate any recent Medical Services you may have received from other than [facility] providers in the past year (date may be approximate).     Assessment:   This is a routine wellness examination for [place].  Hearing/Vision screen  Hearing Screening   125Hz  250Hz  500Hz  1000Hz  2000Hz  3000Hz  4000Hz  6000Hz  8000Hz   Right ear:           Left ear:           Comments: No hearing issues   Vision Screening Comments: [facility] for annual eye exams   Dietary issues and exercise activities discussed: Current Exercise Habits: The patient does not participate in regular exercise at present  Goals    . Patient Stated     Lose weight     . Weight (lb) < 260 lb (117.9 kg)     Would like to decrease weight to ultimate goal of 220 by increasing activity (joining [employer]).  Increase mobility.       Depression Screen PHQ 2/9 Scores 07/25/2020 06/03/2019 03/13/2019 12/10/2018 08/04/2018 06/03/2018 08/12/2017  PHQ - 2 Score 2 0 1 1 1 6 1   PHQ- 9 Score 6 - 1 4 2 18 1     Fall Risk Fall Risk  07/25/2020 06/03/2019 03/13/2019 12/10/2018 08/12/2017  Falls in the past year? 0 0 0 0 No  Number falls in past yr: 0 - 0 0 -  Injury with Fall? 0 0 0 0 -  Risk for fall due to : Impaired vision;Impaired balance/gait - - - -  Follow up Falls prevention discussed Education provided;Falls prevention discussed;Falls evaluation completed - - -    FALL RISK PREVENTION PERTAINING TO THE HOME:  Any stairs in or around the  home? No  If so, are there  any without handrails? No  Home free of loose throw rugs in walkways, pet beds, electrical cords, etc? Yes  Adequate lighting in your home to reduce risk of falls? Yes   ASSISTIVE DEVICES UTILIZED TO PREVENT FALLS:  Life alert? No  Use of a cane, walker or w/c? No  Grab bars in the bathroom? No  Shower chair or bench in shower? No  Elevated toilet seat or a handicapped toilet? No   TIMED UP AND GO:  Was the test performed? Yes .  Length of time to ambulate 10 feet: 10 sec.   Gait steady and fast without use of assistive device  Cognitive Function: MMSE - Mini Mental State Exam 12/13/2014  Not completed: Unable to complete     6CIT Screen 07/25/2020  What Year? 0 points  What month? 0 points  Count back from 20 0 points  Months in reverse 4 points    Immunizations Immunization History  Administered Date(s) Administered  . Influenza Split 05/14/2015  . Influenza, High Dose Seasonal PF 04/26/2019  . Influenza,inj,Quad PF,6+ Mos 08/02/2016, 06/16/2017, 06/12/2018, 04/26/2019  . Influenza-Unspecified 06/16/2017  . PFIZER SARS-COV-2 Vaccination 10/22/2019, 11/11/2019, 05/12/2020  . Pneumococcal Polysaccharide-23 08/04/2018  . Tdap 12/13/2014    TDAP status: Up to date  Flu Vaccine status: Up to date  Pneumococcal vaccine status: Up to date  Covid-19 vaccine status: Completed vaccines  Qualifies for Shingles Vaccine? Yes   Zostavax completed Yes   Shingrix Completed?: Yes  Screening Tests Health Maintenance  Topic Date Due  . INFLUENZA VACCINE  03/13/2020  . PNA vac Low Risk Adult (1 of 2 - PCV13) 10/18/2020 (Originally 09/19/2019)  . COLONOSCOPY  10/07/2023  . TETANUS/TDAP  12/12/2024  . COVID-19 Vaccine  Completed  . Hepatitis C Screening  Completed  . HIV Screening  Completed    Health Maintenance  Health Maintenance Due  Topic Date Due  . INFLUENZA VACCINE  03/13/2020    Colorectal cancer screening: Type of screening:  Colonoscopy. Completed 10/06/18. Repeat every 5 years   Additional Screening:  Hepatitis C Screening: Completed 08/02/16  Vision Screening: Recommended annual ophthalmology exams for early detection of glaucoma and other disorders of the eye. Is the patient up to date with their annual eye exam?  Yes  Who is the provider or what is the name of the office in which the patient attends annual eye exams? [facility] for annual, Dr. Bernarda Caffey  For eye surgery    Dental Screening: Recommended annual dental exams for proper oral hygiene  Community Resource Referral / Chronic Care Management: CRR required this visit?  No   CCM required this visit?  No      Plan:     I have personally reviewed and noted the following in the patient's chart:   . Medical and social history . Use of alcohol, tobacco or illicit drugs  . Current medications and supplements . Functional ability and status . Nutritional status . Physical activity . Advanced directives . List of other physicians . Hospitalizations, surgeries, and ER visits in previous 12 months . Vitals . Screenings to include cognitive, depression, and falls . Referrals and appointments  In addition, I have reviewed and discussed with patient certain preventive protocols, quality metrics, and best practice recommendations. A written personalized care plan for preventive services as well as general preventive health recommendations were provided to patient.     Willette Brace, LPN   [id number]   Nurse Notes: None

## 2020-07-25 NOTE — Patient Instructions (Addendum)
Todd Weeks , Thank you for taking time to come for your Medicare Wellness Visit. I appreciate your ongoing commitment to your health goals. Please review the following plan we discussed and let me know if I can assist you in the future.   Screening recommendations/referrals: Colonoscopy: Done 10/06/18 Recommended yearly ophthalmology/optometry visit for glaucoma screening and checkup Recommended yearly dental visit for hygiene and checkup  Vaccinations: Influ[facility] vaccine: [facility] Pneumococcal vaccine: Up to date Tdap vaccine: Up to date Shingles vaccine: 1st dose at [facility] per pt    Covid-19: Completed 10/22/19, 11/11/19, & 05/12/20  Advanced directives: Advance directive discussed with you today. I have provided a copy for you to complete at home and have notarized. Once this is complete please bring a copy in to our office so we can scan it into your chart.  Conditions/risks identified: Lose weight   Next appointment: Follow up in one year for your annual wellness visit.   Preventive Care 65 Years and Older, Male Preventive care refers to lifestyle choices and visits with your health care provider that can promote health and wellness. What does preventive care include?  A yearly physical exam. This is also called an annual well check.  Dental exams once or twice a year.  Routine eye exams. Ask your health care provider how often you should have your eyes checked.  Personal lifestyle choices, including:  Daily care of your teeth and gums.  Regular physical activity.  Eating a healthy diet.  Avoiding tobacco and drug use.  Limiting alcohol use.  Practicing safe sex.  Taking low doses of aspirin every day.  Taking vitamin and mineral supplements as recommended by your health care provider. What happens during an annual well check? The services and screenings done by your health care provider during your annual well check will depend on your age,  overall health, lifestyle risk factors, and family history of disease. Counseling  Your health care provider may ask you questions about your:  Alcohol use.  Tobacco use.  Drug use.  Emotional well-being.  Home and relationship well-being.  Sexual activity.  Eating habits.  History of falls.  Memory and ability to understand (cognition).  Work and work Statistician. Screening  You may have the following tests or measurements:  Height, weight, and BMI.  Blood pressure.  Lipid and cholesterol levels. These may be checked every 5 years, or more frequently if you are over 70 years old.  Skin check.  Lung cancer screening. You may have this screening every year starting at age 47 if you have a 30-pack-year history of smoking and currently smoke or have quit within the past 15 years.  Fecal occult blood test (FOBT) of the stool. You may have this test every year starting at age 50.  Flexible sigmoidoscopy or colonoscopy. You may have a sigmoidoscopy every 5 years or a colonoscopy every 10 years starting at age 45.  Prostate cancer screening. Recommendations will vary depending on your family history and other risks.  Hepatitis C blood test.  Hepatitis B blood test.  Sexually transmitted disease (STD) testing.  Diabetes screening. This is done by checking your blood sugar (glucose) after you have not eaten for a while (fasting). You may have this done every 1-3 years.  Abdominal aortic aneurysm (AAA) screening. You may need this if you are a current or former smoker.  Osteoporosis. You may be screened starting at age 23 if you are at high risk. Talk with your health  care provider about your test results, treatment options, and if necessary, the need for more tests. Vaccines  Your health care provider may recommend certain vaccines, such as:  Influenza vaccine. This is recommended every year.  Tetanus, diphtheria, and acellular pertussis (Tdap, Td) vaccine. You may  need a Td booster every 10 years.  Zoster vaccine. You may need this after age 43.  Pneumococcal 13-valent conjugate (PCV13) vaccine. One dose is recommended after age 53.  Pneumococcal polysaccharide (PPSV23) vaccine. One dose is recommended after age 57. Talk to your health care provider about which screenings and vaccines you need and how often you need them. This information is not intended to replace advice given to you by your health care provider. Make sure you discuss any questions you have with your health care provider. Document Released: 08/26/2015 Document Revised: 04/18/2016 Document Reviewed: 05/31/2015 Elsevier Interactive Patient Education  2017 [place] Prevention in the Home Falls can cause injuries. They can happen to people of all ages. There are many things you can do to make your home safe and to help prevent falls. What can I do on the outside of my home?  Regularly fix the edges of walkways and driveways and fix any cracks.  Remove anything that might make you trip as you walk through a door, such as a raised step or threshold.  Trim any bushes or trees on the path to your home.  Use bright outdoor lighting.  Clear any walking paths of anything that might make someone trip, such as rocks or tools.  Regularly check to see if handrails are loose or broken. Make sure that both sides of any steps have handrails.  Any raised decks and porches should have guardrails on the edges.  Have any leaves, snow, or ice cleared regularly.  Use sand or salt on walking paths during winter.  Clean up any spills in your garage right away. This includes oil or grease spills. What can I do in the bathroom?  Use night lights.  Install grab bars by the toilet and in the tub and shower. Do not use towel bars as grab bars.  Use non-skid mats or decals in the tub or shower.  If you need to sit down in the shower, use a plastic, non-slip stool.  Keep the floor  dry. Clean up any water that spills on the floor as soon as it happens.  Remove soap buildup in the tub or shower regularly.  Attach bath mats securely with double-sided non-slip rug tape.  Do not have throw rugs and other things on the floor that can make you trip. What can I do in the bedroom?  Use night lights.  Make sure that you have a light by your bed that is easy to reach.  Do not use any sheets or blankets that are too big for your bed. They should not hang down onto the floor.  Have a firm chair that has side arms. You can use this for support while you get dressed.  Do not have throw rugs and other things on the floor that can make you trip. What can I do in the kitchen?  Clean up any spills right away.  Avoid walking on wet floors.  Keep items that you use a lot in easy-to-reach places.  If you need to reach something above you, use a strong step stool that has a grab bar.  Keep electrical cords out of the way.  Do not use floor  polish or wax that makes floors slippery. If you must use wax, use non-skid floor wax.  Do not have throw rugs and other things on the floor that can make you trip. What can I do with my stairs?  Do not leave any items on the stairs.  Make sure that there are handrails on both sides of the stairs and use them. Fix handrails that are broken or loose. Make sure that handrails are as [place] as the stairways.  Check any carpeting to make sure that it is firmly attached to the stairs. Fix any carpet that is loose or worn.  Avoid having throw rugs at the top or bottom of the stairs. If you do have throw rugs, attach them to the floor with carpet tape.  Make sure that you have a light switch at the top of the stairs and the bottom of the stairs. If you do not have them, ask someone to add them for you. What else can I do to help prevent falls?  Wear shoes that:  Do not have high heels.  Have rubber bottoms.  Are comfortable and fit you  well.  Are closed at the toe. Do not wear sandals.  If you use a stepladder:  Make sure that it is fully opened. Do not climb a closed stepladder.  Make sure that both sides of the stepladder are locked into place.  Ask someone to hold it for you, if possible.  Clearly mark and make sure that you can see:  Any grab bars or handrails.  First and last steps.  Where the edge of each step is.  Use tools that help you move around (mobility aids) if they are needed. These include:  Canes.  Walkers.  Scooters.  Crutches.  Turn on the lights when you go into a dark area. Replace any light bulbs as soon as they burn out.  Set up your furniture so you have a clear path. Avoid moving your furniture around.  If any of your floors are uneven, fix them.  If there are any pets around you, be aware of where they are.  Review your medicines with your doctor. Some medicines can make you feel dizzy. This can increase your chance of falling. Ask your doctor what other things that you can do to help prevent falls. This information is not intended to replace advice given to you by your health care provider. Make sure you discuss any questions you have with your health care provider. Document Released: 05/26/2009 Document Revised: 01/05/2016 Document Reviewed: 09/03/2014 Elsevier Interactive Patient Education  2017 [employer].

## 2020-08-07 IMAGING — DX DG CHEST 2V
2 series · 2 of 2 positions shown · non-contrast
Comparison: Chest radiograph August 05, 2007

CLINICAL DATA: Shortness of breath and chest pain for a few days.

EXAM:
CHEST - 2 VIEW

[chest pa]
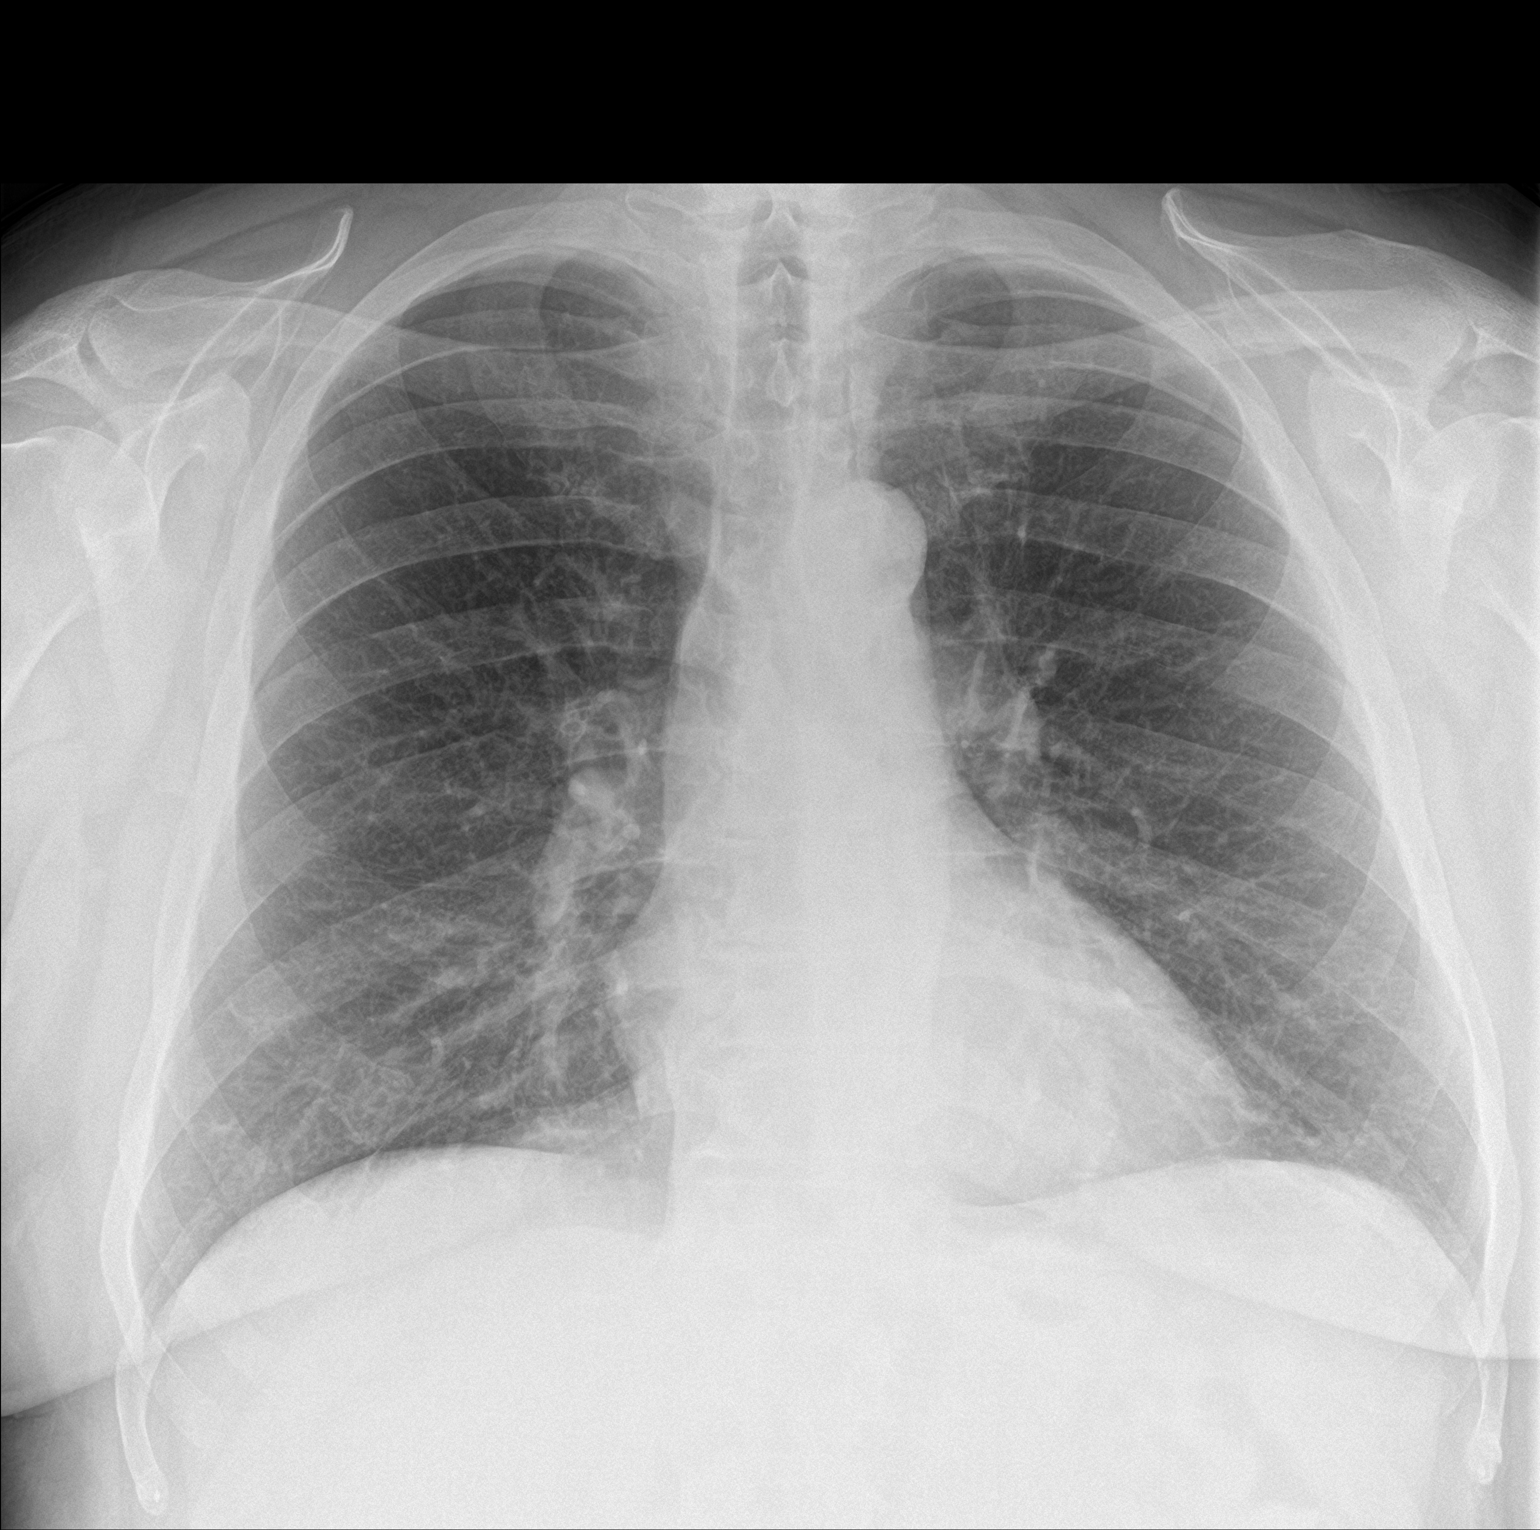

[chest lat]
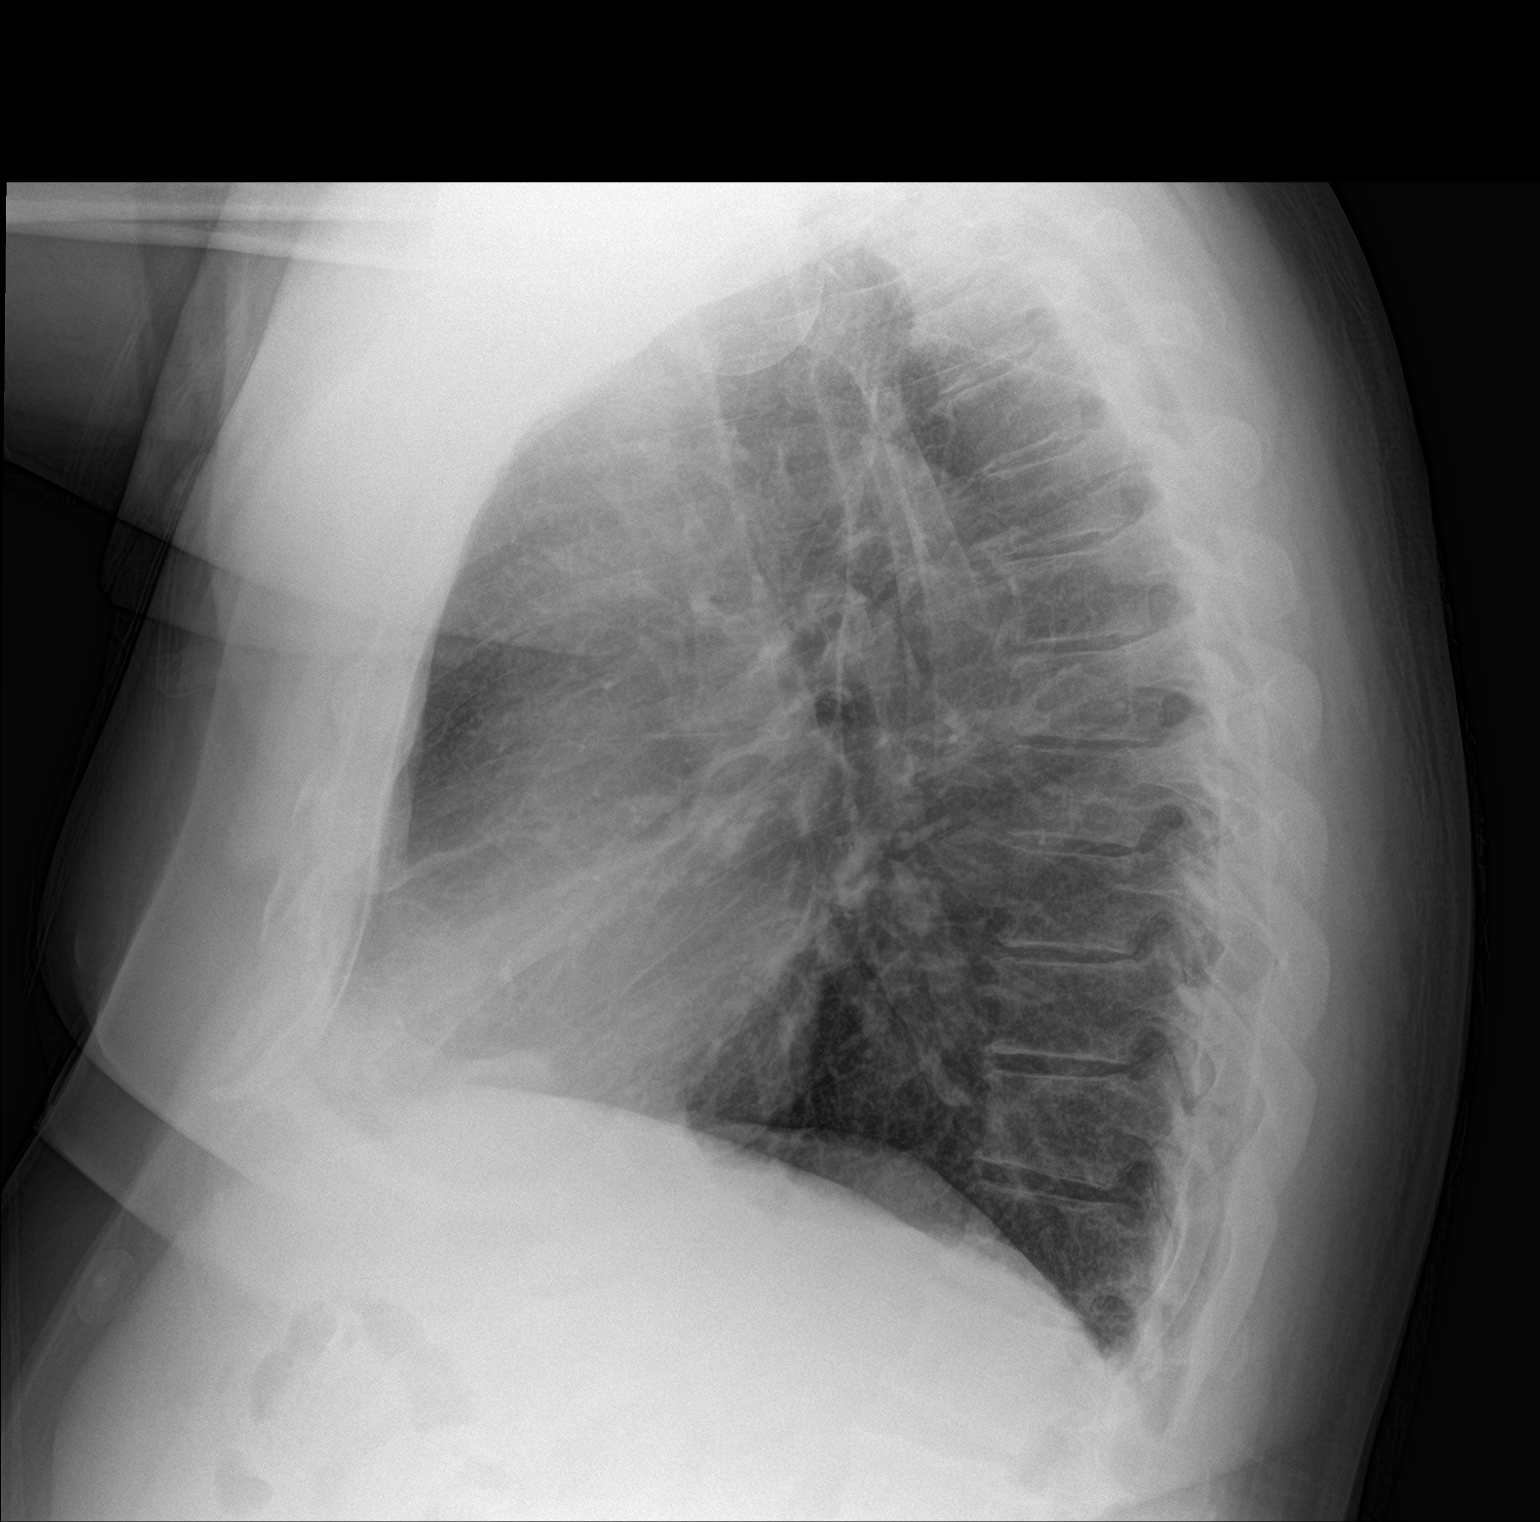

[2 of 2 positions shown; findings below may reference images not displayed]

FINDINGS: Cardiomediastinal silhouette is normal. No pleural effusions or
focal consolidations. Mild bronchitic changes. Trachea projects
midline and there is no pneumothorax. Soft tissue planes and
included osseous structures are non-suspicious.
IMPRESSION: Mild bronchitic changes without focal consolidation.

## 2020-08-10 NOTE — Progress Notes (Signed)
[facility] Note  08/19/2020     CHIEF COMPLAINT Patient presents for Retina Follow Up   HISTORY OF PRESENT ILLNESS: Todd Weeks is a 64 y.o. male who presents to the clinic today for:  HPI    Retina Follow Up    Patient presents with  Retinal Break/Detachment.  In right eye.  This started weeks ago.  Severity is moderate.  Duration of weeks.  Since onset it is gradually improving.  I, the attending physician,  performed the HPI with the patient and updated documentation appropriately.          Comments    Pt states vision OD is gradually improving--gas bubble gone per pt.  Patient states it still looks like hes looking through a coke bottle OD.  Denies eye pain or discomfort OU and denies any new or worsening floaters or fol OU.       Last edited by Bernarda Caffey, MD on 08/21/2020  1:36 AM. (History)    Pt states the gas bubble disappeared about 10 days ago, he is still using PF QID and brimonidine QD, he states he is starting to see a little "smokiness" in his central vision, he feels like his vision is distorted, he says it's like looking through a "Coke bottle"  Referring physician: Vivi Barrack, MD [place],  [place] [address]  HISTORICAL INFORMATION:   Selected notes from the MEDICAL RECORD NUMBER    CURRENT MEDICATIONS: Current Outpatient Medications (Ophthalmic Drugs)  Medication Sig   brimonidine (ALPHAGAN) 0.2 % ophthalmic solution Place 1 drop into the right eye 2 (two) times daily.   dorzolamide-timolol (COSOPT) 22.3-6.8 MG/ML ophthalmic solution Place 1 drop into the right eye 2 (two) times daily. (Patient not taking: No sig reported)   neomycin-polymyxin b-dexamethasone (MAXITROL) 3.5-10000-0.1 OINT Place 1 application into the right eye 4 (four) times daily. (Patient not taking: Reported on 07/25/2020)   prednisoLONE acetate (PRED FORTE) 1 % ophthalmic suspension Place 1 drop into the right eye 4 (four) times daily.    No current facility-administered medications for this visit. (Ophthalmic Drugs)   Current Outpatient Medications (Other)  Medication Sig   atorvastatin (LIPITOR) 40 MG tablet TAKE 1 TABLET BY MOUTHSONCE A DAY.N (Patient not taking: Reported on 07/25/2020)   bacitracin 500 UNIT/GM ointment Apply 1 application topically 2 (two) times daily.   clopidogrel (PLAVIX) 75 MG tablet TAKE (1) TABLET BY MOUTH ONCE DAILY.   famotidine (PEPCID) 20 MG tablet One daily (Patient taking differently: Take 20 mg by mouth daily.)   FLUoxetine (PROZAC) 20 MG capsule TAKE 1 CAPSULE BY MOUTH EVERY MORNING   FLUZONE HIGH-DOSE QUADRIVALENT 0.7 ML SUSY    irbesartan (AVAPRO) 300 MG tablet Take 1 tablet (300 mg total) by mouth daily.   Multiple Vitamins-Minerals (MULTIVITAMIN WITH MINERALS) tablet Take 1 tablet by mouth daily.   SHINGRIX injection    simvastatin (ZOCOR) 40 MG tablet Take 40 mg by mouth daily.    tamsulosin (FLOMAX) 0.4 MG CAPS capsule Take 1 capsule (0.4 mg total) by mouth daily.   No current facility-administered medications for this visit. (Other)      REVIEW OF SYSTEMS: ROS    Positive for: Gastrointestinal, Cardiovascular, Eyes   Negative for: Constitutional, Neurological, Skin, Genitourinary, Musculoskeletal, HENT, Endocrine, Respiratory, Psychiatric, Allergic/Imm, Heme/Lymph   Last edited by Doneen Poisson on 08/19/2020  8:34 AM. (History)       ALLERGIES No Known Allergies  PAST MEDICAL HISTORY  Past Medical History:  Diagnosis Date   Bell's palsy    Cataract    Mixed form OU   Closed nondisplaced fracture of neck of third metacarpal bone of right hand 02/04/2017   Clotting disorder ([place])    Depression    Diverticulitis    GERD (gastroesophageal reflux disease)    Headache(784.0)    HTN (hypertension)    Hyperlipidemia    Hypertensive retinopathy    OU   Mild CAD 2012   PVD (peripheral vascular disease) (H[facility])    Sexual dysfunction    Past  Surgical History:  Procedure Laterality Date   ABDOMINAL AORTOGRAM W/LOWER EXTREMITY Bilateral 02/05/2020   Procedure: ABDOMINAL AORTOGRAM W/LOWER EXTREMITY;  Surgeon: Elam Dutch, MD;  Location: [facility];  Service: Cardiovascular;  Laterality: Bilateral;   EYE SURGERY Right 05/06/2020   Pneumatic retinopexy for rheg. RD repair - Dr. Bernarda Caffey   EYE SURGERY Right 05/26/2020   PPV/MP - Dr. Bernarda Caffey   GAS INSERTION Right 05/26/2020   Procedure: INSERTION OF GAS;  Surgeon: Bernarda Caffey, MD;  Location: [facility];  Service: Ophthalmology;  Laterality: Right;   MEMBRANE PEEL Right 05/26/2020   Procedure: MEMBRANE PEEL;  Surgeon: Bernarda Caffey, MD;  Location: [facility];  Service: Ophthalmology;  Laterality: Right;   PARS PLANA VITRECTOMY Right 05/26/2020   Procedure: PARS PLANA VITRECTOMY WITH 25 GAUGE;  Surgeon: Bernarda Caffey, MD;  Location: [place] [facility];  Service: Ophthalmology;  Laterality: Right;   PHOTOCOAGULATION Right 05/26/2020   Procedure: PHOTOCOAGULATION;  Surgeon: Bernarda Caffey, MD;  Location: [place] [facility];  Service: Ophthalmology;  Laterality: Right;   PROSTATE BIOPSY  2015   RETINAL DETACHMENT SURGERY Right 05/06/2020   Pneumatic retinopexy for rheg RD repair - Dr. Bernarda Caffey   RETINAL DETACHMENT SURGERY Right 05/26/2020   PPV/MP - Dr. Bernarda Caffey    FAMILY HISTORY Family History  Problem Relation Age of Onset   Hypertension Mother    Deep vein thrombosis Father    Diabetes Brother    Tuberculosis Maternal Grandfather    Emphysema Maternal Grandfather    Lung disease Brother    Asthma Son    Rectal cancer Neg Hx    Colon cancer Neg Hx     SOCIAL HISTORY Social History   Tobacco Use   Smoking status: Former Smoker    Packs/day: 1.50    Years: 46.00    Pack years: 69.00    Types: Cigarettes    Quit date: 12/12/2011    Years since quitting: 8.6   Smokeless tobacco: Never Used  Vaping Use   Vaping Use: Never used  Substance Use Topics    Alcohol use: Yes    Alcohol/week: 4.0 standard drinks    Types: 4 Cans of beer per week    Comment: a couple of beer on a weekend   Drug use: Not Currently    Types: Cocaine, Marijuana, "Crack" cocaine    Comment: over 25 years ago         OPHTHALMIC EXAM:  Base Eye Exam    Visual Acuity (Snellen - Linear)      Right Left   Dist [place] 20/200 +1 20/25 -2   Dist ph [place] 20/70 +1 20/20 -2       Tonometry (Tonopen, 8:39 AM)      Right Left   Pressure 22 15       Pupils      Dark Light Shape React APD   Right 6 6 Round  dilated 0   Left 3 2 Round Brisk 0       Visual Fields      Left Right    Full Full       Extraocular Movement      Right Left    Full Full       Neuro/Psych    Oriented x3: Yes   Mood/Affect: Normal       Dilation    Both eyes: 1.0% Mydriacyl, 2.5% Phenylephrine @ 8:39 AM        Slit Lamp and Fundus Exam    External Exam      Right Left   External periorbital edema Normal       Slit Lamp Exam      Right Left   Lids/Lashes Dermatochalasis - upper lid Normal   Conjunctiva/Sclera White and quiet White and quiet   Cornea 2+ fine Punctate epithelial erosions, Debris in tear film trace Punctate epithelial erosions   Anterior Chamber deep, narrow temporal angle, 0.5+ Cell/pigment Deep and quiet   Iris Round and dilated Round and dilated   Lens 3+ Nuclear sclerosis with brunescence, 2-3+ Cortical cataract, focal cortical wedge at 0600, 1+Posterior subcapsular cataract 1+ Nuclear sclerosis, 1+ Cortical cataract   Vitreous post vitrectomy, gas bubble gone, vitreous condensations Vitreous syneresis, Posterior vitreous detachment       Fundus Exam      Right Left   Disc Pink and Sharp, +cupping Pink and Sharp   C/D Ratio 0.6 0.6   Macula Flat; blunted foveal reflex, ERM gone; mild RPE mottling, scattered MA Flat, Blunted foveal reflex, mild ERM, No heme or edema   Vessels attenuated, mild tortuousity attenuated, mild tortuousity   Periphery  Attached but with scattered shallow pockets of residual SRF, greatest inferiorly; good cryo ST quad, good 360 laser changes; ORIGINALLY: Temporal mac off detachment w/ HST at 1030 Attached, HST at 0130 no SRF--good laser changes surrounding, mild inferior paving stone degeneration        Refraction    Wearing Rx      Sphere Cylinder Axis Add   Right +1.00 +1.50 178 +2.00   Left +0.50 +1.25 166 +2.00          IMAGING AND PROCEDURES  Imaging and Procedures for 08/19/2020  OCT, Retina - OU - Both Eyes       Right Eye Quality was good. Central Foveal Thickness: 274. Progression has improved. Findings include subretinal fluid, abnormal foveal contour, no IRF (ERM gone and retinal thickening improved; mild interval improvement in shallow, residual SRF greatest inferiorly).   Left Eye Quality was good. Central Foveal Thickness: 281. Progression has been stable. Findings include normal foveal contour, no IRF, no SRF, epiretinal membrane.   Notes *Images captured and stored on drive  Diagnosis / Impression:  OD: ERM gone and retinal thickening improved; mild interval improvement in shallow, residual SRF greatest inferiorly OS: NFP, no IRF/SRF  Clinical management:  See below  Abbreviations: NFP - Normal foveal profile. CME - cystoid macular edema. PED - pigment epithelial detachment. IRF - intraretinal fluid. SRF - subretinal fluid. EZ - ellipsoid zone. ERM - epiretinal membrane. ORA - outer retinal atrophy. ORT - outer retinal tubulation. SRHM - subretinal hyper-reflective material. IRHM - intraretinal hyper-reflective material                ASSESSMENT/PLAN:    ICD-10-CM   1. Right retinal detachment  H33.21   2. Retinal edema  H35.81 OCT, Retina - OU -  Both Eyes  3. Epiretinal membrane (ERM) of right eye  H35.371   4. Retinal tear of left eye  H33.312   5. Essential hypertension  I10   6. Hypertensive retinopathy of both eyes  H35.033   7. Combined forms of age-related  cataract of both eyes  H25.813     1,2. Rhegmatogenous retinal detachment, OD - bullous superior mac off detachment, onset of foveal involvement Thurday, 09.23.21 by pt history - detached temporally from 0730 to 11 oclock, fovea off, horse shoe tear at 1030 - s/p pneumatic retinopexy w/ C3F8 OD 09.24.21 - supplemental intravitreal C3F8 gas injection OD (2.5cc on 10.06.21) **persistent shallow SRF remained due to ERM preventing full reattachment**  - POW4 s/p PPV/TissueBlue stain/MP/EL/FAX/14% C3F8 OD, 10.14.2021  - ERM gone, but tr shallow residual pockets of SRF persist -- improving slowly  - BCVA decreased to 20/70 (PH) from 20/60 -- cataract progression  - gas bubble gone             - IOP slightly elevated at 22  - OCT shows ERM gone and retinal thickening improved; residual trace/shallow pockets of SRF -- improved from pre-op  ** pt reports reduced compliance with face down positioning and post op activity restrictions **             - continue  PF QID OD                          Brimonidine QD OD -- increase to BID  - start artificial tears QID OD             - post op drop instructions reviewed              - tylenol/ibuprofen for pain  - f/u 4-6 weeks, POV, DFE, OCT   3. Epiretinal membrane, right eye  - +ERM w/ pucker likely impacting RD and SRF OD  - POW4 s/p PPV/TissueBlue stain/MP/EL/FAX/14% C3F8 OD, 10.14.2021 as above - ERM gone  - OCT shows ERM gone and retinal thickening improved; residual trace/shallow SRF -- improved from pre-op - drops as above  4. Retinal tear, OS - horse shoe tear located at 0130 - s/p laser retinopexy OS 09.24.21 - good laser changes in place  5,6. Hypertensive retinopathy OU - discussed importance of tight BP control - monitor  7. Mixed Cataract OU - The symptoms of cataract, surgical options, and treatments and risks were discussed with patient. - discussed diagnosis and progression    Ophthalmic Meds Ordered this visit:  Meds  ordered this encounter  Medications   brimonidine (ALPHAGAN) 0.2 % ophthalmic solution    Sig: Place 1 drop into the right eye 2 (two) times daily.    Dispense:  15 mL    Refill:  2   prednisoLONE acetate (PRED FORTE) 1 % ophthalmic suspension    Sig: Place 1 drop into the right eye 4 (four) times daily.    Dispense:  15 mL    Refill:  0      Return for f/u 4-6 weeks, RD OD, DFE, OCT.  There are no Patient Instructions on file for this visit.  This document serves as a record of services personally performed by Gardiner Sleeper, MD, PhD. It was created on their behalf by San Jetty. Owens Shark, OA an ophthalmic technician. The creation of this record is the provider's dictation and/or activities during the visit.    Electronically signed by: San Jetty. Owens Shark, OA  12.29.2021 1:42 AM   Gardiner Sleeper, M.D., Ph.D. Diseases & Surgery of the Retina and Vitreous [facility]  I have reviewed the above documentation for accuracy and completeness, and I agree with the above. Gardiner Sleeper, M.D., Ph.D. 08/21/20 1:42 AM   Abbreviations: M myopia (nearsighted); A astigmatism; H hyperopia (farsighted); P presbyopia; Mrx spectacle prescription;  CTL contact lenses; OD right eye; OS left eye; OU both eyes  XT exotropia; ET esotropia; PEK punctate epithelial keratitis; PEE punctate epithelial erosions; DES dry eye syndrome; MGD meibomian gland dysfunction; ATs artificial tears; PFAT's preservative free artificial tears; [place] nuclear sclerotic cataract; PSC posterior subcapsular cataract; ERM epi-retinal membrane; PVD posterior vitreous detachment; RD retinal detachment; DM diabetes mellitus; DR diabetic retinopathy; NPDR non-proliferative diabetic retinopathy; PDR proliferative diabetic retinopathy; CSME clinically significant macular edema; DME diabetic macular edema; dbh dot blot hemorrhages; CWS cotton wool spot; POAG primary open angle glaucoma; C/D cup-to-disc ratio; HVF humphrey  visual field; GVF goldmann visual field; OCT optical coherence tomography; IOP intraocular pressure; BRVO Branch retinal vein occlusion; CRVO central retinal vein occlusion; CRAO central retinal artery occlusion; BRAO branch retinal artery occlusion; RT retinal tear; SB scleral buckle; PPV pars plana vitrectomy; VH Vitreous hemorrhage; PRP panretinal laser photocoagulation; IVK intravitreal kenalog; VMT vitreomacular traction; MH Macular hole;  NVD neovascularization of the disc; NVE neovascularization elsewhere; AREDS age related eye disease study; ARMD age related macular degeneration; POAG primary open angle glaucoma; EBMD epithelial/anterior basement membrane dystrophy; ACIOL anterior chamber intraocular lens; IOL intraocular lens; PCIOL posterior chamber intraocular lens; Phaco/IOL phacoemulsification with intraocular lens placement; [place] photorefractive keratectomy; LASIK laser assisted in situ keratomileusis; HTN hypertension; DM diabetes mellitus; COPD chronic obstructive pulmonary disease

## 2020-08-15 ENCOUNTER — Telehealth: Payer: Self-pay | Admitting: Acute Care

## 2020-08-16 NOTE — Telephone Encounter (Signed)
Synetta Fail, can you contact pt to schedule his f/u low dose screening CT?

## 2020-08-16 NOTE — Telephone Encounter (Signed)
I have spoke with Todd Weeks and his LCS CT has been scheduled on 08/22/2020 @9 :30am and he is aware of the appt and location [facility]

## 2020-08-19 ENCOUNTER — Other Ambulatory Visit: Payer: Self-pay

## 2020-08-19 ENCOUNTER — Ambulatory Visit (INDEPENDENT_AMBULATORY_CARE_PROVIDER_SITE_OTHER): Payer: PPO | Admitting: Ophthalmology

## 2020-08-19 DIAGNOSIS — H35033 Hypertensive retinopathy, bilateral: Secondary | ICD-10-CM | POA: Diagnosis not present

## 2020-08-19 DIAGNOSIS — I10 Essential (primary) hypertension: Secondary | ICD-10-CM | POA: Diagnosis not present

## 2020-08-19 DIAGNOSIS — H3581 Retinal edema: Secondary | ICD-10-CM | POA: Diagnosis not present

## 2020-08-19 DIAGNOSIS — H35371 Puckering of macula, right eye: Secondary | ICD-10-CM

## 2020-08-19 DIAGNOSIS — H33312 Horseshoe tear of retina without detachment, left eye: Secondary | ICD-10-CM | POA: Diagnosis not present

## 2020-08-19 DIAGNOSIS — H25813 Combined forms of age-related cataract, bilateral: Secondary | ICD-10-CM

## 2020-08-19 DIAGNOSIS — H3321 Serous retinal detachment, right eye: Secondary | ICD-10-CM | POA: Diagnosis not present

## 2020-08-19 MED ORDER — BRIMONIDINE TARTRATE 0.2 % OP SOLN: 1.0000 [drp] | Freq: Two times a day (BID) | OPHTHALMIC | 2 refills | Status: DC

## 2020-08-19 MED ORDER — PREDNISOLONE ACETATE 1 % OP SUSP: 1.0000 [drp] | Freq: Four times a day (QID) | OPHTHALMIC | 0 refills | Status: DC

## 2020-08-21 ENCOUNTER — Encounter (INDEPENDENT_AMBULATORY_CARE_PROVIDER_SITE_OTHER): Payer: Self-pay | Admitting: Ophthalmology

## 2020-08-22 ENCOUNTER — Ambulatory Visit (INDEPENDENT_AMBULATORY_CARE_PROVIDER_SITE_OTHER)
Admission: RE | Admit: 2020-08-22 | Discharge: 2020-08-22 | Disposition: A | Discharge status: Home / Self Care | Payer: PPO | Source: Ambulatory Visit | Attending: Acute Care | Admitting: Acute Care

## 2020-08-22 ENCOUNTER — Other Ambulatory Visit: Payer: Self-pay

## 2020-08-22 DIAGNOSIS — Z87891 Personal history of nicotine dependence: Secondary | ICD-10-CM

## 2020-08-25 NOTE — Progress Notes (Signed)
Please call patient and let them  know their  low dose Ct was read as a Lung RADS 2: nodules that are benign in appearance and behavior with a very low likelihood of becoming a clinically active cancer due to size or lack of growth. Recommendation per radiology is for a repeat LDCT in 12 months. .Please let them  know we will order and schedule their  annual screening scan for 08/2021. Please let them  know there was notation of CAD on their  scan.  Please remind the patient  that this is a non-gated exam therefore degree or severity of disease  cannot be determined. Please have them  follow up with their PCP regarding potential risk factor modification, dietary therapy or pharmacologic therapy if clinically indicated. Pt.  is  currently on statin therapy. Please place order for annual  screening scan for  08/2021 and fax results to PCP. Thanks so much. This patient is currently followed by cards. Thanks [employer]

## 2020-08-30 ENCOUNTER — Other Ambulatory Visit: Payer: Self-pay | Admitting: *Deleted

## 2020-08-30 DIAGNOSIS — Z87891 Personal history of nicotine dependence: Secondary | ICD-10-CM

## 2020-09-05 ENCOUNTER — Other Ambulatory Visit: Payer: Self-pay | Admitting: Family Medicine

## 2020-09-05 DIAGNOSIS — F418 Other specified anxiety disorders: Secondary | ICD-10-CM

## 2020-09-09 DIAGNOSIS — H4060X0 Glaucoma secondary to drugs, unspecified eye, stage unspecified: Secondary | ICD-10-CM | POA: Diagnosis not present

## 2020-09-09 DIAGNOSIS — Z01818 Encounter for other preprocedural examination: Secondary | ICD-10-CM | POA: Diagnosis not present

## 2020-09-09 DIAGNOSIS — H25811 Combined forms of age-related cataract, right eye: Secondary | ICD-10-CM | POA: Diagnosis not present

## 2020-09-15 ENCOUNTER — Telehealth: Payer: Self-pay | Admitting: *Deleted

## 2020-09-15 NOTE — Telephone Encounter (Signed)
[facility]  Surgery form was faxed  today [phone]

## 2020-09-19 DIAGNOSIS — H409 Unspecified glaucoma: Secondary | ICD-10-CM | POA: Diagnosis not present

## 2020-09-19 DIAGNOSIS — H25811 Combined forms of age-related cataract, right eye: Secondary | ICD-10-CM | POA: Diagnosis not present

## 2020-09-20 NOTE — Progress Notes (Signed)
[facility] Note  09/26/2020     CHIEF COMPLAINT Patient presents for Retina Follow Up   HISTORY OF PRESENT ILLNESS: Todd Weeks is a 65 y.o. male who presents to the clinic today for:  HPI    Retina Follow Up    Patient presents with  Retinal Break/Detachment.  In right eye.  Duration of 5 weeks.  Since onset it is stable.  I, the attending physician,  performed the HPI with the patient and updated documentation appropriately.          Comments    5 week follow up RD repair OD 05/26/2020. OD is better but still not where he would like for it to be.  Using PF QID, Cosopt QID (he said blue top not purple), and Ats OD.         Last edited by Bernarda Caffey, MD on 09/26/2020 11:25 PM. (History)    Pt had cataract sx OD with Dr. Manuella Ghazi last Monday (02.07.22), pt states it helped with his vision, but he is still not happy with where his vision is, he states he also had a stent placed to lower his IOP, he states he is on PF, refresh and an antibiotic drop  Referring physician: Vivi Barrack, MD [place],  [place] [address]  HISTORICAL INFORMATION:   Selected notes from the [facility]: Current Outpatient Medications (Ophthalmic Drugs)  Medication Sig  . dorzolamide-timolol (COSOPT) 22.3-6.8 MG/ML ophthalmic solution Place 1 drop into the right eye 2 (two) times daily.  . prednisoLONE acetate (PRED FORTE) 1 % ophthalmic suspension Place 1 drop into the right eye 4 (four) times daily.  . brimonidine (ALPHAGAN) 0.2 % ophthalmic solution Place 1 drop into the right eye 2 (two) times daily. (Patient not taking: Reported on 09/26/2020)  . neomycin-polymyxin b-dexamethasone (MAXITROL) 3.5-10000-0.1 OINT Place 1 application into the right eye 4 (four) times daily. (Patient not taking: No sig reported)   No current facility-administered medications for this visit. (Ophthalmic Drugs)   Current Outpatient Medications  (Other)  Medication Sig  . atorvastatin (LIPITOR) 40 MG tablet   . clopidogrel (PLAVIX) 75 MG tablet TAKE (1) TABLET BY MOUTH ONCE DAILY.  . famotidine (PEPCID) 20 MG tablet One daily (Patient taking differently: Take 20 mg by mouth daily.)  . FLUoxetine (PROZAC) 20 MG capsule TAKE 1 CAPSULE BY MOUTH EVERY MORNING  . FLUZONE HIGH-DOSE QUADRIVALENT 0.7 ML SUSY   . irbesartan (AVAPRO) 300 MG tablet Take 1 tablet (300 mg total) by mouth daily.  . Multiple Vitamins-Minerals (MULTIVITAMIN WITH MINERALS) tablet Take 1 tablet by mouth daily.  . simvastatin (ZOCOR) 40 MG tablet Take 40 mg by mouth daily.   . tamsulosin (FLOMAX) 0.4 MG CAPS capsule Take 1 capsule (0.4 mg total) by mouth daily.  . bacitracin 500 UNIT/GM ointment Apply 1 application topically 2 (two) times daily. (Patient not taking: Reported on 09/26/2020)  . SHINGRIX injection    No current facility-administered medications for this visit. (Other)      REVIEW OF SYSTEMS: ROS    Positive for: Gastrointestinal, Cardiovascular, Eyes   Negative for: Constitutional, Neurological, Skin, Genitourinary, Musculoskeletal, HENT, Endocrine, Respiratory, Psychiatric, Allergic/Imm, Heme/Lymph   Last edited by Leonie Douglas, COA on 09/26/2020  8:41 AM. (History)       ALLERGIES No Known Allergies  PAST MEDICAL HISTORY Past Medical History:  Diagnosis Date  . Bell's palsy   . Cataract  Mixed form OU  . Closed nondisplaced fracture of neck of third metacarpal bone of right hand 02/04/2017  . Clotting disorder ([place])   . Depression   . Diverticulitis   . GERD (gastroesophageal reflux disease)   . Headache(784.0)   . HTN (hypertension)   . Hyperlipidemia   . Hypertensive retinopathy    OU  . Mild CAD 2012  . PVD (peripheral vascular disease) ([place])   . Sexual dysfunction    Past Surgical History:  Procedure Laterality Date  . ABDOMINAL AORTOGRAM W/LOWER EXTREMITY Bilateral 02/05/2020   Procedure: ABDOMINAL AORTOGRAM W/LOWER  EXTREMITY;  Surgeon: Elam Dutch, MD;  Location: [facility];  Service: Cardiovascular;  Laterality: Bilateral;  . EYE SURGERY Right 05/06/2020   Pneumatic retinopexy for rheg. RD repair - Dr. Bernarda Caffey  . EYE SURGERY Right 05/26/2020   PPV/MP - Dr. Bernarda Caffey  . GAS INSERTION Right 05/26/2020   Procedure: INSERTION OF GAS;  Surgeon: Bernarda Caffey, MD;  Location: [facility];  Service: Ophthalmology;  Laterality: Right;  . MEMBRANE PEEL Right 05/26/2020   Procedure: MEMBRANE PEEL;  Surgeon: Bernarda Caffey, MD;  Location: [facility];  Service: Ophthalmology;  Laterality: Right;  . PARS PLANA VITRECTOMY Right 05/26/2020   Procedure: PARS PLANA VITRECTOMY WITH 25 GAUGE;  Surgeon: Bernarda Caffey, MD;  Location: [facility];  Service: Ophthalmology;  Laterality: Right;  . PHOTOCOAGULATION Right 05/26/2020   Procedure: PHOTOCOAGULATION;  Surgeon: Bernarda Caffey, MD;  Location: [facility];  Service: Ophthalmology;  Laterality: Right;  . PROSTATE BIOPSY  2015  . RETINAL DETACHMENT SURGERY Right 05/06/2020   Pneumatic retinopexy for rheg RD repair - Dr. Bernarda Caffey  . RETINAL DETACHMENT SURGERY Right 05/26/2020   PPV/MP - Dr. Bernarda Caffey    FAMILY HISTORY Family History  Problem Relation Age of Onset  . Hypertension Mother   . Deep vein thrombosis Father   . Diabetes Brother   . Tuberculosis Maternal Grandfather   . Emphysema Maternal Grandfather   . Lung disease Brother   . Asthma Son   . Rectal cancer Neg Hx   . Colon cancer Neg Hx     SOCIAL HISTORY Social History   Tobacco Use  . Smoking status: Former Smoker    Packs/day: 1.50    Years: 46.00    Pack years: 69.00    Types: Cigarettes    Quit date: 12/12/2011    Years since quitting: 8.7  . Smokeless tobacco: Never Used  Vaping Use  . Vaping Use: Never used  Substance Use Topics  . Alcohol use: Yes    Alcohol/week: 4.0 standard drinks    Types: 4 Cans of beer per week    Comment: a couple of beer on a weekend  . Drug use: Not  Currently    Types: Cocaine, Marijuana, "Crack" cocaine    Comment: over 25 years ago         OPHTHALMIC EXAM:  Base Eye Exam    Visual Acuity (Snellen - Linear)      Right Left   Dist cc 20/60 20/30   Dist ph cc NI NI  Pt has his glasses with him today.  Vision is better cc OD and cs OS.        Tonometry (Tonopen, 8:49 AM)      Right Left   Pressure 14 21       Pupils      Dark Light Shape React APD   Right 5 5 Round Minimal None  Left 3 2 Round Brisk None       Visual Fields (Counting fingers)      Left Right    Full Full       Extraocular Movement      Right Left    Full Full       Neuro/Psych    Oriented x3: Yes   Mood/Affect: Normal       Dilation    Both eyes: 1.0% Mydriacyl, 2.5% Phenylephrine @ 8:49 AM        Slit Lamp and Fundus Exam    External Exam      Right Left   External periorbital edema Normal       Slit Lamp Exam      Right Left   Lids/Lashes Dermatochalasis - upper lid Normal   Conjunctiva/Sclera White and quiet White and quiet   Cornea Trace Punctate epithelial erosions, well healed cataract wound, 1+ Descemet's folds, trace endo heme nasally trace Punctate epithelial erosions   Anterior Chamber deep, narrow temporal angle, 0.5+ Cell/pigment Deep and quiet   Iris Round and dilated Round and dilated   Lens PC IOL in good position, trace Posterior capsular opacification 1+ Nuclear sclerosis, 1+ Cortical cataract   Vitreous post vitrectomy, trace pigment, vitreous condensations Vitreous syneresis, Posterior vitreous detachment       Fundus Exam      Right Left   Disc Pink and Sharp, +cupping Pink and Sharp   C/D Ratio 0.6 0.6   Macula Flat; blunted foveal reflex, ERM gone; mild RPE mottling, scattered MA Flat, Blunted foveal reflex, mild ERM, No heme or edema   Vessels attenuated, mild tortuousity attenuated, mild tortuousity   Periphery Attached but with scattered shallow pockets of residual SRF greatest inferiorly; good cryo  ST quad, good 360 laser changes; ORIGINALLY: Temporal mac off detachment w/ HST at 1030 Attached, HST at 0130 no SRF--good laser changes surrounding, mild inferior paving stone degeneration        Refraction    Wearing Rx      Sphere Cylinder Axis Add   Right +1.00 +1.50 178 +2.00   Left +0.50 +1.25 166 +2.00          IMAGING AND PROCEDURES  Imaging and Procedures for 09/26/2020  OCT, Retina - OU - Both Eyes       Right Eye Quality was good. Central Foveal Thickness: 270. Progression has improved. Findings include subretinal fluid, abnormal foveal contour, no IRF (ERM gone and retinal thickening improved; mild interval improvement in shallow, residual pockets of SRF greatest inferiorly).   Left Eye Quality was good. Central Foveal Thickness: 286. Progression has been stable. Findings include normal foveal contour, no IRF, no SRF, epiretinal membrane.   Notes *Images captured and stored on drive  Diagnosis / Impression:  OD: ERM gone and retinal thickening improved; mild interval improvement in shallow, residual pockets of SRF greatest inferiorly OS: NFP, no IRF/SRF  Clinical management:  See below  Abbreviations: NFP - Normal foveal profile. CME - cystoid macular edema. PED - pigment epithelial detachment. IRF - intraretinal fluid. SRF - subretinal fluid. EZ - ellipsoid zone. ERM - epiretinal membrane. ORA - outer retinal atrophy. ORT - outer retinal tubulation. SRHM - subretinal hyper-reflective material. IRHM - intraretinal hyper-reflective material                ASSESSMENT/PLAN:    ICD-10-CM   1. Right retinal detachment  H33.21   2. Retinal edema  H35.81 OCT, Retina - OU -  Both Eyes  3. Epiretinal membrane (ERM) of right eye  H35.371   4. Retinal tear of left eye  H33.312   5. Essential hypertension  I10   6. Hypertensive retinopathy of both eyes  H35.033   7. Combined forms of age-related cataract of left eye  H25.812   8. Pseudophakia  Z96.1     1,2.  Rhegmatogenous retinal detachment, OD - bullous superior mac off detachment, onset of foveal involvement Thurday, 09.23.21 by pt history - detached temporally from 0730 to 11 oclock, fovea off, horse shoe tear at 1030 - s/p pneumatic retinopexy w/ C3F8 OD 09.24.21 - supplemental intravitreal C3F8 gas injection OD (2.5cc on 10.06.21) **persistent shallow SRF remained due to ERM preventing full reattachment**  - s/p PPV/TissueBlue stain/MP/EL/FAX/14% C3F8 OD, 10.14.2021  - ERM gone, but tr shallow pockets of residual pockets of SRF persist -- improving slowly  - BCVA improved to 20/60 (PH) -- 1 week post cataract sx  - gas bubble gone             - IOP good at 14  - OCT shows ERM gone and retinal thickening improved; residual trace/shallow pockets of SRF -- improved from pre-op  ** pt reports reduced compliance with face down positioning and post op activity restrictions **             - drops per Dr. Ruthell Rummage             - post op drop instructions reviewed              - tylenol/ibuprofen for pain  - f/u 3 months, POV, DFE, OCT   3. Epiretinal membrane, right eye  - +ERM w/ pucker likely impacting RD and SRF OD  - s/p PPV/TissueBlue stain/MP/EL/FAX/14% C3F8 OD, 10.14.2021 as above - ERM gone  - OCT shows ERM gone and retinal thickening improved; residual trace/shallow SRF -- improved from pre-op - drops as above  4. Retinal tear, OS - horse shoe tear located at 0130 - s/p laser retinopexy OS 09.24.21 - good laser changes in place  5,6. Hypertensive retinopathy OU - discussed importance of tight BP control - monitor  7. Mixed Cataract OS - The symptoms of cataract, surgical options, and treatments and risks were discussed with patient. - discussed diagnosis and progression   8. Pseudophakia OD  - s/p CE/IOL OD (Dr. Manuella Ghazi, 02.07.22)  - IOL in good position, doing well - monitor    Ophthalmic Meds Ordered this visit:  No orders of the defined types were placed in this  encounter.     Return in about 3 months (around 12/24/2020) for f/u RD / ERM OD, DFE, OCT.  There are no Patient Instructions on file for this visit.  This document serves as a record of services personally performed by Gardiner Sleeper, MD, PhD. It was created on their behalf by Leeann Must, [place], an ophthalmic technician. The creation of this record is the provider's dictation and/or activities during the visit.    Electronically signed by: Leeann Must, COA _0 @ 11:29 PM   This document serves as a record of services personally performed by Gardiner Sleeper, MD, PhD. It was created on their behalf by San Jetty. Owens Shark, OA an ophthalmic technician. The creation of this record is the provider's dictation and/or activities during the visit.    Electronically signed by: San Jetty. Marguerita Merles 02.14.2022 11:29 PM  Gardiner Sleeper, M.D., Ph.D. Diseases & Surgery of the Retina and Vitreous  [place] 09/26/2020   I have reviewed the above documentation for accuracy and completeness, and I agree with the above. Gardiner Sleeper, M.D., Ph.D. 09/26/20 11:29 PM   Abbreviations: M myopia (nearsighted); A astigmatism; H hyperopia (farsighted); P presbyopia; Mrx spectacle prescription;  CTL contact lenses; OD right eye; OS left eye; OU both eyes  XT exotropia; ET esotropia; PEK punctate epithelial keratitis; PEE punctate epithelial erosions; DES dry eye syndrome; MGD meibomian gland dysfunction; ATs artificial tears; PFAT's preservative free artificial tears; [place] nuclear sclerotic cataract; PSC posterior subcapsular cataract; ERM epi-retinal membrane; PVD posterior vitreous detachment; RD retinal detachment; DM diabetes mellitus; DR diabetic retinopathy; NPDR non-proliferative diabetic retinopathy; PDR proliferative diabetic retinopathy; CSME clinically significant macular edema; DME diabetic macular edema; dbh dot blot hemorrhages; CWS cotton wool spot; POAG primary open angle  glaucoma; C/D cup-to-disc ratio; HVF humphrey visual field; GVF goldmann visual field; OCT optical coherence tomography; IOP intraocular pressure; BRVO Branch retinal vein occlusion; CRVO central retinal vein occlusion; CRAO central retinal artery occlusion; BRAO branch retinal artery occlusion; RT retinal tear; SB scleral buckle; PPV pars plana vitrectomy; VH Vitreous hemorrhage; PRP panretinal laser photocoagulation; IVK intravitreal kenalog; VMT vitreomacular traction; MH Macular hole;  NVD neovascularization of the disc; NVE neovascularization elsewhere; AREDS age related eye disease study; ARMD age related macular degeneration; POAG primary open angle glaucoma; EBMD epithelial/anterior basement membrane dystrophy; ACIOL anterior chamber intraocular lens; IOL intraocular lens; PCIOL posterior chamber intraocular lens; Phaco/IOL phacoemulsification with intraocular lens placement; [place] photorefractive keratectomy; LASIK laser assisted in situ keratomileusis; HTN hypertension; DM diabetes mellitus; COPD chronic obstructive pulmonary disease

## 2020-09-26 ENCOUNTER — Other Ambulatory Visit: Payer: Self-pay

## 2020-09-26 ENCOUNTER — Encounter (INDEPENDENT_AMBULATORY_CARE_PROVIDER_SITE_OTHER): Payer: Self-pay | Admitting: Ophthalmology

## 2020-09-26 ENCOUNTER — Ambulatory Visit (INDEPENDENT_AMBULATORY_CARE_PROVIDER_SITE_OTHER): Payer: PPO | Admitting: Ophthalmology

## 2020-09-26 DIAGNOSIS — H35371 Puckering of macula, right eye: Secondary | ICD-10-CM | POA: Diagnosis not present

## 2020-09-26 DIAGNOSIS — H33312 Horseshoe tear of retina without detachment, left eye: Secondary | ICD-10-CM | POA: Diagnosis not present

## 2020-09-26 DIAGNOSIS — I10 Essential (primary) hypertension: Secondary | ICD-10-CM | POA: Diagnosis not present

## 2020-09-26 DIAGNOSIS — Z961 Presence of intraocular lens: Secondary | ICD-10-CM

## 2020-09-26 DIAGNOSIS — H3581 Retinal edema: Secondary | ICD-10-CM

## 2020-09-26 DIAGNOSIS — H3321 Serous retinal detachment, right eye: Secondary | ICD-10-CM

## 2020-09-26 DIAGNOSIS — H25813 Combined forms of age-related cataract, bilateral: Secondary | ICD-10-CM

## 2020-09-26 DIAGNOSIS — H35033 Hypertensive retinopathy, bilateral: Secondary | ICD-10-CM | POA: Diagnosis not present

## 2020-09-26 DIAGNOSIS — H25812 Combined forms of age-related cataract, left eye: Secondary | ICD-10-CM

## 2020-10-03 ENCOUNTER — Other Ambulatory Visit: Payer: Self-pay | Admitting: Family Medicine

## 2020-10-03 DIAGNOSIS — I2510 Atherosclerotic heart disease of native coronary artery without angina pectoris: Secondary | ICD-10-CM

## 2020-11-14 ENCOUNTER — Other Ambulatory Visit: Payer: Self-pay | Admitting: Family Medicine

## 2020-11-15 ENCOUNTER — Other Ambulatory Visit: Payer: Self-pay | Admitting: Family Medicine

## 2020-11-15 NOTE — Telephone Encounter (Signed)
Please schedule an appointment with Dr.Parker for further refills.

## 2020-11-30 ENCOUNTER — Telehealth: Payer: Self-pay

## 2020-11-30 NOTE — Telephone Encounter (Signed)
Pt called to r/s his ABI and MD appt. He had eye surgery and was unable to make the previous appt. Pt is aware of this new appt date/time. No further questions/concerns at this time.

## 2020-12-05 ENCOUNTER — Other Ambulatory Visit: Payer: Self-pay

## 2020-12-05 DIAGNOSIS — I70219 Atherosclerosis of native arteries of extremities with intermittent claudication, unspecified extremity: Secondary | ICD-10-CM

## 2020-12-07 NOTE — Progress Notes (Signed)
[facility] Note  12/12/2020     CHIEF COMPLAINT Patient presents for Retina Follow Up   HISTORY OF PRESENT ILLNESS: Todd Weeks is a 65 y.o. male who presents to the clinic today for:  HPI    Retina Follow Up    Patient presents with  Retinal Break/Detachment.  In right eye.  This started 3 months ago.  Since onset it is gradually worsening.  I, the attending physician,  performed the HPI with the patient and updated documentation appropriately.          Comments    Pt here for 3 mo RD OD/ERMD OD. Pt states he feels vision is on slow decline, more difficult to focus/depth perception. Pt did recently have cataract surgery 2 months ago w/ Dr. Manuella Ghazi. Reports stent was placed OD. Pt has not filled his new rx for glasses but is able to see with out rx specs. Wears his old ones mostly for reading.        Last edited by Bernarda Caffey, MD on 12/12/2020 12:43 PM. (History)    Pt states he still has "wavy vision" and he feels like his eyes are not working together, which is causing his depth perception to be off, he has not had left eye cataract sx  Referring physician: Vivi Barrack, MD [address] [place],  [place] [address]  HISTORICAL INFORMATION:   Selected notes from the MEDICAL RECORD NUMBER    CURRENT MEDICATIONS: Current Outpatient Medications (Ophthalmic Drugs)  Medication Sig  . prednisoLONE acetate (PRED FORTE) 1 % ophthalmic suspension Place 1 drop into the right eye 4 (four) times daily. (Patient not taking: No sig reported)   No current facility-administered medications for this visit. (Ophthalmic Drugs)   Current Outpatient Medications (Other)  Medication Sig  . atorvastatin (LIPITOR) 40 MG tablet Take 1 tablet (40 mg total) by mouth daily. (Patient taking differently: Take 40 mg by mouth in the morning.)  . cephALEXin (KEFLEX) 500 MG capsule Take 1 capsule (500 mg total) by mouth 3 (three) times daily.  . clopidogrel (PLAVIX) 75 MG tablet  TAKE (1) TABLET BY MOUTH ONCE DAILY. (Patient taking differently: Take 75 mg by mouth in the morning. TAKE (1) TABLET BY MOUTH ONCE DAILY.)  . diclofenac Sodium (VOLTAREN) 1 % GEL Apply 1 application topically 3 (three) times daily as needed (leg cramps/spasms).  . famotidine (PEPCID) 20 MG tablet One daily (Patient taking differently: Take 20 mg by mouth in the morning.)  . FLUoxetine (PROZAC) 20 MG capsule TAKE 1 CAPSULE BY MOUTH EVERY MORNING (Patient taking differently: Take 20 mg by mouth in the morning.)  . irbesartan (AVAPRO) 300 MG tablet Take 1 tablet (300 mg total) by mouth daily. (Patient taking differently: Take 300 mg by mouth in the morning.)  . Multiple Vitamins-Minerals (MULTIVITAMIN WITH MINERALS) tablet Take 1 tablet by mouth daily.  . tamsulosin (FLOMAX) 0.4 MG CAPS capsule Take 1 capsule (0.4 mg total) by mouth daily. (Patient taking differently: Take 0.4 mg by mouth in the morning.)   No current facility-administered medications for this visit. (Other)      REVIEW OF SYSTEMS: ROS    Positive for: Gastrointestinal, Cardiovascular, Eyes   Negative for: Constitutional, Neurological, Skin, Genitourinary, Musculoskeletal, HENT, Endocrine, Respiratory, Psychiatric, Allergic/Imm, Heme/Lymph   Last edited by Kingsley Spittle, COT on 12/12/2020 12:17 PM. (History)       ALLERGIES No Known Allergies  PAST MEDICAL HISTORY Past Medical History:  Diagnosis  Date  . Bell's palsy   . Cataract    Mixed form OU  . Closed nondisplaced fracture of neck of third metacarpal bone of right hand 02/04/2017  . Clotting disorder ([place])   . Depression   . Diverticulitis   . GERD (gastroesophageal reflux disease)   . Headache(784.0)   . HTN (hypertension)   . Hyperlipidemia   . Hypertensive retinopathy    OU  . Mild CAD 2012  . PVD (peripheral vascular disease) ([place])   . Sexual dysfunction    Past Surgical History:  Procedure Laterality Date  . ABDOMINAL AORTOGRAM W/LOWER  EXTREMITY Bilateral 02/05/2020   Procedure: ABDOMINAL AORTOGRAM W/LOWER EXTREMITY;  Surgeon: Elam Dutch, MD;  Location: [facility];  Service: Cardiovascular;  Laterality: Bilateral;  . EYE SURGERY Right 05/06/2020   Pneumatic retinopexy for rheg. RD repair - Dr. Bernarda Caffey  . EYE SURGERY Right 05/26/2020   PPV/MP - Dr. Bernarda Caffey  . GAS INSERTION Right 05/26/2020   Procedure: INSERTION OF GAS;  Surgeon: Bernarda Caffey, MD;  Location: [place];  Service: Ophthalmology;  Laterality: Right;  . MEMBRANE PEEL Right 05/26/2020   Procedure: MEMBRANE PEEL;  Surgeon: Bernarda Caffey, MD;  Location: [place];  Service: Ophthalmology;  Laterality: Right;  . PARS PLANA VITRECTOMY Right 05/26/2020   Procedure: PARS PLANA VITRECTOMY WITH 25 GAUGE;  Surgeon: Bernarda Caffey, MD;  Location: [place];  Service: Ophthalmology;  Laterality: Right;  . PHOTOCOAGULATION Right 05/26/2020   Procedure: PHOTOCOAGULATION;  Surgeon: Bernarda Caffey, MD;  Location: [facility];  Service: Ophthalmology;  Laterality: Right;  . PROSTATE BIOPSY  2015  . RETINAL DETACHMENT SURGERY Right 05/06/2020   Pneumatic retinopexy for rheg RD repair - Dr. Bernarda Caffey  . RETINAL DETACHMENT SURGERY Right 05/26/2020   PPV/MP - Dr. Bernarda Caffey    FAMILY HISTORY Family History  Problem Relation Age of Onset  . Hypertension Mother   . Deep vein thrombosis Father   . Diabetes Brother   . Tuberculosis Maternal Grandfather   . Emphysema Maternal Grandfather   . Lung disease Brother   . Asthma Son   . Rectal cancer Neg Hx   . Colon cancer Neg Hx     SOCIAL HISTORY Social History   Tobacco Use  . Smoking status: Former Smoker    Packs/day: 1.50    Years: 46.00    Pack years: 69.00    Types: Cigarettes    Quit date: 12/12/2011    Years since quitting: 9.0  . Smokeless tobacco: Never Used  Vaping Use  . Vaping Use: Never used  Substance Use Topics  . Alcohol use: Yes    Alcohol/week: 4.0 standard drinks    Types: 4 Cans of  beer per week    Comment: a couple of beer on a weekend  . Drug use: Not Currently    Types: Cocaine, Marijuana, "Crack" cocaine    Comment: over 25 years ago         OPHTHALMIC EXAM:  Base Eye Exam    Visual Acuity (Snellen - Linear)      Right Left   Dist [place] 20/25 -1 20/20 -1   Dist ph Post NI        Tonometry (Tonopen, 12:29 PM)      Right Left   Pressure 16 16       Pupils      Dark Light Shape React APD   Right 5 5 Round Minimal None   Left 3 2  Round Brisk None       Visual Fields (Counting fingers)      Left Right    Full Full       Extraocular Movement      Right Left    Full, Ortho Full, Ortho       Neuro/Psych    Oriented x3: Yes   Mood/Affect: Normal       Dilation    Both eyes: 1.0% Mydriacyl, 2.5% Phenylephrine @ 12:29 PM        Slit Lamp and Fundus Exam    External Exam      Right Left   External periorbital edema Normal       Slit Lamp Exam      Right Left   Lids/Lashes Dermatochalasis - upper lid Normal   Conjunctiva/Sclera White and quiet White and quiet   Cornea Trace Punctate epithelial erosions, well healed cataract wound trace Punctate epithelial erosions   Anterior Chamber Deep and clear, narrow temporal angle Deep and quiet   Iris Round and dilated, device at 0230 angle Round and dilated   Lens PC IOL in good position, trace Posterior capsular opacification 2+ Nuclear sclerosis, 2+ Cortical cataract   Vitreous post vitrectomy, trace pigment, vitreous condensations Vitreous syneresis, Posterior vitreous detachment       Fundus Exam      Right Left   Disc Pink and Sharp, +cupping Pink and Sharp   C/D Ratio 0.6 0.6   Macula Flat; blunted foveal reflex, ERM gone; mild RPE mottling, scattered MA Flat, Blunted foveal reflex, mild ERM, No heme or edema   Vessels attenuated, mild tortuousity attenuated, mild tortuousity   Periphery Attached but with scattered shallow pockets of residual SRF greatest inferiorly; good cryo ST quad,  good 360 laser changes; ORIGINALLY: Temporal mac off detachment w/ HST at 1030 Attached, HST at 0130 no SRF--good laser changes surrounding, mild inferior paving stone degeneration, No new RT/RD          IMAGING AND PROCEDURES  Imaging and Procedures for 12/12/2020  OCT, Retina - OU - Both Eyes       Right Eye Quality was good. Central Foveal Thickness: 273. Progression has improved. Findings include subretinal fluid, no IRF, normal foveal contour (ERM gone and retinal thickening improved; interval improvement in shallow, residual pockets of SRF greatest inferiorly).   Left Eye Quality was good. Central Foveal Thickness: 279. Progression has been stable. Findings include normal foveal contour, no IRF, no SRF, epiretinal membrane.   Notes *Images captured and stored on drive  Diagnosis / Impression:  OD: ERM gone and retinal thickening improved; interval improvement in shallow, residual pockets of SRF greatest inferiorly OS: NFP, no IRF/SRF  Clinical management:  See below  Abbreviations: NFP - Normal foveal profile. CME - cystoid macular edema. PED - pigment epithelial detachment. IRF - intraretinal fluid. SRF - subretinal fluid. EZ - ellipsoid zone. ERM - epiretinal membrane. ORA - outer retinal atrophy. ORT - outer retinal tubulation. SRHM - subretinal hyper-reflective material. IRHM - intraretinal hyper-reflective material                ASSESSMENT/PLAN:    ICD-10-CM   1. Right retinal detachment  H33.21   2. Retinal edema  H35.81 OCT, Retina - OU - Both Eyes  3. Epiretinal membrane (ERM) of right eye  H35.371   4. Retinal tear of left eye  H33.312   5. Essential hypertension  I10   6. Hypertensive retinopathy of both eyes  H35.033  7. Combined forms of age-related cataract of left eye  H25.812   8. Pseudophakia  Z96.1     1,2. Rhegmatogenous retinal detachment, OD - bullous superior mac off detachment, onset of foveal involvement Thurday, 09.23.21 by pt  history - detached temporally from 0730 to 11 oclock, fovea off, horse shoe tear at 1030 - s/p pneumatic retinopexy w/ C3F8 OD 09.24.21 - supplemental intravitreal C3F8 gas injection OD (2.5cc on 10.06.21) **persistent shallow SRF remained due to ERM preventing full reattachment**  - s/p PPV/TissueBlue stain/MP/EL/FAX/14% C3F8 OD, 10.14.2021  - exam and OCT show ERM gone, but tr shallow pockets of residual pockets of SRF persist -- improving slowly  - BCVA improved to 20/60 (PH) -- 1 week post cataract sx  - now BCVA 20/25!             - IOP good at 16 - f/u 4 months, POV, DFE, OCT   3. Epiretinal membrane, right eye  - +ERM w/ pucker likely impacting RD and SRF OD  - s/p PPV/TissueBlue stain/MP/EL/FAX/14% C3F8 OD, 10.14.2021 as above - ERM gone - OCT shows ERM gone and retinal thickening improved - monitor  4. Retinal tear, OS - horse shoe tear located at 0130 - s/p laser retinopexy OS 09.24.21 - good laser changes in place - no new RT/RD  5,6. Hypertensive retinopathy OU - discussed importance of tight BP control - monitor  7. Mixed Cataract OS - The symptoms of cataract, surgical options, and treatments and risks were discussed with patient. - discussed diagnosis and progression   8. Pseudophakia OD  - s/p CE/IOL OD (Dr. Manuella Ghazi, 02.07.22)  - IOL in good position, doing well - monitor    Ophthalmic Meds Ordered this visit:  No orders of the defined types were placed in this encounter.     Return in about 4 months (around 04/14/2021) for f/u RD OD, DFE, OCT.  There are no Patient Instructions on file for this visit.  This document serves as a record of services personally performed by Gardiner Sleeper, MD, PhD. It was created on their behalf by Leeann Must, [place], an ophthalmic technician. The creation of this record is the provider's dictation and/or activities during the visit.    Electronically signed by: Leeann Must, COA _0 @ 10:29 PM   This document serves  as a record of services personally performed by Gardiner Sleeper, MD, PhD. It was created on their behalf by San Jetty. Owens Shark, OA an ophthalmic technician. The creation of this record is the provider's dictation and/or activities during the visit.    Electronically signed by: San Jetty. Owens Shark, [place] 05.02.2022 10:29 PM  Gardiner Sleeper, M.D., Ph.D. Diseases & Surgery of the Retina and [place] 12/12/2020   I have reviewed the above documentation for accuracy and completeness, and I agree with the above. Gardiner Sleeper, M.D., Ph.D. 12/12/20 10:29 PM  Abbreviations: M myopia (nearsighted); A astigmatism; H hyperopia (farsighted); P presbyopia; Mrx spectacle prescription;  CTL contact lenses; OD right eye; OS left eye; OU both eyes  XT exotropia; ET esotropia; PEK punctate epithelial keratitis; PEE punctate epithelial erosions; DES dry eye syndrome; MGD meibomian gland dysfunction; ATs artificial tears; PFAT's preservative free artificial tears; [place] nuclear sclerotic cataract; PSC posterior subcapsular cataract; ERM epi-retinal membrane; PVD posterior vitreous detachment; RD retinal detachment; DM diabetes mellitus; DR diabetic retinopathy; NPDR non-proliferative diabetic retinopathy; PDR proliferative diabetic retinopathy; CSME clinically significant macular edema; DME diabetic macular edema; dbh dot blot  hemorrhages; CWS cotton wool spot; POAG primary open angle glaucoma; C/D cup-to-disc ratio; HVF humphrey visual field; GVF goldmann visual field; OCT optical coherence tomography; IOP intraocular pressure; BRVO Branch retinal vein occlusion; CRVO central retinal vein occlusion; CRAO central retinal artery occlusion; BRAO branch retinal artery occlusion; RT retinal tear; SB scleral buckle; PPV pars plana vitrectomy; VH Vitreous hemorrhage; PRP panretinal laser photocoagulation; IVK intravitreal kenalog; VMT vitreomacular traction; MH Macular hole;  NVD neovascularization of  the disc; NVE neovascularization elsewhere; AREDS age related eye disease study; ARMD age related macular degeneration; POAG primary open angle glaucoma; EBMD epithelial/anterior basement membrane dystrophy; ACIOL anterior chamber intraocular lens; IOL intraocular lens; PCIOL posterior chamber intraocular lens; Phaco/IOL phacoemulsification with intraocular lens placement; [place] photorefractive keratectomy; LASIK laser assisted in situ keratomileusis; HTN hypertension; DM diabetes mellitus; COPD chronic obstructive pulmonary disease

## 2020-12-08 ENCOUNTER — Other Ambulatory Visit: Payer: Self-pay | Admitting: *Deleted

## 2020-12-08 ENCOUNTER — Encounter: Payer: Self-pay | Admitting: Vascular Surgery

## 2020-12-08 ENCOUNTER — Ambulatory Visit (HOSPITAL_COMMUNITY)
Admission: RE | Admit: 2020-12-08 | Discharge: 2020-12-08 | Disposition: A | Discharge status: Home / Self Care | Payer: PPO | Source: Ambulatory Visit | Attending: Vascular Surgery | Admitting: Vascular Surgery

## 2020-12-08 ENCOUNTER — Ambulatory Visit (INDEPENDENT_AMBULATORY_CARE_PROVIDER_SITE_OTHER): Payer: PPO | Admitting: Vascular Surgery

## 2020-12-08 ENCOUNTER — Other Ambulatory Visit: Payer: Self-pay

## 2020-12-08 ENCOUNTER — Encounter: Payer: Self-pay | Admitting: *Deleted

## 2020-12-08 VITALS — BP 152/82 | HR 65 | Temp 98.2°F | Resp 20 | Ht 72.0 in | Wt 265.0 lb

## 2020-12-08 DIAGNOSIS — I739 Peripheral vascular disease, unspecified: Secondary | ICD-10-CM

## 2020-12-08 DIAGNOSIS — I70219 Atherosclerosis of native arteries of extremities with intermittent claudication, unspecified extremity: Secondary | ICD-10-CM | POA: Diagnosis not present

## 2020-12-08 MED ORDER — CEPHALEXIN 500 MG PO CAPS: 500.0000 mg | ORAL_CAPSULE | Freq: Three times a day (TID) | ORAL | 0 refills | Status: DC

## 2020-12-08 NOTE — H&P (View-Only) (Signed)
Patient is a 65 year old male with known peripheral arterial disease and claudication in both lower extremities.  His right leg is worse than his left.  He has a flush occlusion of his left superficial femoral artery with three-vessel runoff and reconstitution of the above-knee popliteal artery.  He has a patent right superficial femoral artery stent with in-stent restenosis of about 50% and then calcific subtotal occlusion of the SFA distal to this.  This is from an arteriogram approximately 10 months ago.  We were going to consider doing an atherectomy of his right superficial femoral artery but this was initially delayed by COVID diagnosis followed by detached retina procedure.  Patient now wishes to proceed with having an intervention for the right leg.  He currently is able to walk about 50 yards before experiencing claudication symptoms.  He is on Plavix and a statin.  Recently he did develop a paronychia in the right first toe and he states this is continued to smolder.  He has seen a podiatrist in the past but not recently.  He has no wounds on the left foot.  Past Medical History:  Diagnosis Date  . Bell's palsy   . Cataract    Mixed form OU  . Closed nondisplaced fracture of neck of third metacarpal bone of right hand 02/04/2017  . Clotting disorder ([place])   . Depression   . Diverticulitis   . GERD (gastroesophageal reflux disease)   . Headache(784.0)   . HTN (hypertension)   . Hyperlipidemia   . Hypertensive retinopathy    OU  . Mild CAD 2012  . PVD (peripheral vascular disease) ([place])   . Sexual dysfunction     Past Surgical History:  Procedure Laterality Date  . ABDOMINAL AORTOGRAM W/LOWER EXTREMITY Bilateral 02/05/2020   Procedure: ABDOMINAL AORTOGRAM W/LOWER EXTREMITY;  Surgeon: Elam Dutch, MD;  Location: [facility];  Service: Cardiovascular;  Laterality: Bilateral;  . EYE SURGERY Right 05/06/2020   Pneumatic retinopexy for rheg. RD repair - Dr. Bernarda Caffey  .  EYE SURGERY Right 05/26/2020   PPV/MP - Dr. Bernarda Caffey  . GAS INSERTION Right 05/26/2020   Procedure: INSERTION OF GAS;  Surgeon: Bernarda Caffey, MD;  Location: [facility];  Service: Ophthalmology;  Laterality: Right;  . MEMBRANE PEEL Right 05/26/2020   Procedure: MEMBRANE PEEL;  Surgeon: Bernarda Caffey, MD;  Location: [facility];  Service: Ophthalmology;  Laterality: Right;  . PARS PLANA VITRECTOMY Right 05/26/2020   Procedure: PARS PLANA VITRECTOMY WITH 25 GAUGE;  Surgeon: Bernarda Caffey, MD;  Location: [facility];  Service: Ophthalmology;  Laterality: Right;  . PHOTOCOAGULATION Right 05/26/2020   Procedure: PHOTOCOAGULATION;  Surgeon: Bernarda Caffey, MD;  Location: [facility];  Service: Ophthalmology;  Laterality: Right;  . PROSTATE BIOPSY  2015  . RETINAL DETACHMENT SURGERY Right 05/06/2020   Pneumatic retinopexy for rheg RD repair - Dr. Bernarda Caffey  . RETINAL DETACHMENT SURGERY Right 05/26/2020   PPV/MP - Dr. Bernarda Caffey    Current Outpatient Medications on File Prior to Visit  Medication Sig Dispense Refill  . atorvastatin (LIPITOR) 40 MG tablet Take 1 tablet (40 mg total) by mouth daily. 30 tablet 0  . clopidogrel (PLAVIX) 75 MG tablet TAKE (1) TABLET BY MOUTH ONCE DAILY. 90 tablet 0  . dorzolamide-timolol (COSOPT) 22.3-6.8 MG/ML ophthalmic solution Place 1 drop into the right eye 2 (two) times daily.    . famotidine (PEPCID) 20 MG tablet One daily (Patient taking differently: Take 20 mg by mouth daily.) 90 tablet  3  . FLUoxetine (PROZAC) 20 MG capsule TAKE 1 CAPSULE BY MOUTH EVERY MORNING 90 capsule 0  . irbesartan (AVAPRO) 300 MG tablet Take 1 tablet (300 mg total) by mouth daily. 30 tablet 0  . Multiple Vitamins-Minerals (MULTIVITAMIN WITH MINERALS) tablet Take 1 tablet by mouth daily.    . prednisoLONE acetate (PRED FORTE) 1 % ophthalmic suspension Place 1 drop into the right eye 4 (four) times daily. 15 mL 0  . simvastatin (ZOCOR) 40 MG tablet Take 40 mg by mouth daily.     . tamsulosin (FLOMAX)  0.4 MG CAPS capsule Take 1 capsule (0.4 mg total) by mouth daily. 30 capsule 3   No current facility-administered medications on file prior to visit.     Review of systems: He has no shortness of breath.  He has no chest pain.  Physical exam:  Vitals:   12/08/20 0822  BP: (!) 152/82  Pulse: 65  Resp: 20  Temp: 98.2 F (36.8 C)  SpO2: 95%  Weight: 265 lb (120.2 kg)  Height: 6' (1.829 m)    Extremities: 2+ femoral pulses absent popliteal and pedal pulses bilaterally  Skin: Mild erythema edge of nail bed right first toe no expressible purulence no fluctuance  Data: Patient had bilateral ABIs performed today which were 0.55 on the right 0.79 on the left.  I reviewed and interpreted the study.  These are fairly similar to his previous ABIs June 2021 although the right leg may be slightly decreased.  Assessment: Lifestyle limiting claudication bilateral lower extremities.  I discussed with patient today the options of bilateral femoral above-knee popliteal bypass to improve his symptoms.  He is not a candidate for Endo intervention in the left leg due to the flush occlusion of the SFA.  However, he would be a candidate for atherectomy of his right SFA and reballooning of his right SFA stent.  We discussed that the durability of this is about 50% 1 year.  We also discussed durability of the femoral above-knee popliteal bypass is about 85 to 90% 5-year.  We also discussed that the trade off for this would be a bigger operation upfront with hospitalization of 3 to 5 days with bypass versus same-day surgery for his arteriogram.  Plan: Patient has opted for an intervention in the right lower extremity with potential atherectomy of the right superficial femoral artery and angioplasty and stenting.  Risk benefits possible complications and procedure details were discussed with the patient today including not limited to bleeding infection possibility of being unable to technically really open the  right superficial femoral artery and potentially injuring the vessel.  Other possible complications bleeding contrast reaction.  He understands and agrees to proceed.  His arteriogram is scheduled for Friday, Dec 16, 2020.  Ruta Hinds, MD [facility] Office: [phone]

## 2020-12-08 NOTE — Progress Notes (Signed)
Patient is a 65 year old male with known peripheral arterial disease and claudication in both lower extremities.  His right leg is worse than his left.  He has a flush occlusion of his left superficial femoral artery with three-vessel runoff and reconstitution of the above-knee popliteal artery.  He has a patent right superficial femoral artery stent with in-stent restenosis of about 50% and then calcific subtotal occlusion of the SFA distal to this.  This is from an arteriogram approximately 10 months ago.  We were going to consider doing an atherectomy of his right superficial femoral artery but this was initially delayed by COVID diagnosis followed by detached retina procedure.  Patient now wishes to proceed with having an intervention for the right leg.  He currently is able to walk about 50 yards before experiencing claudication symptoms.  He is on Plavix and a statin.  Recently he did develop a paronychia in the right first toe and he states this is continued to smolder.  He has seen a podiatrist in the past but not recently.  He has no wounds on the left foot.  Past Medical History:  Diagnosis Date  . Bell's palsy   . Cataract    Mixed form OU  . Closed nondisplaced fracture of neck of third metacarpal bone of right hand 02/04/2017  . Clotting disorder ([place])   . Depression   . Diverticulitis   . GERD (gastroesophageal reflux disease)   . Headache(784.0)   . HTN (hypertension)   . Hyperlipidemia   . Hypertensive retinopathy    OU  . Mild CAD 2012  . PVD (peripheral vascular disease) ([place])   . Sexual dysfunction     Past Surgical History:  Procedure Laterality Date  . ABDOMINAL AORTOGRAM W/LOWER EXTREMITY Bilateral 02/05/2020   Procedure: ABDOMINAL AORTOGRAM W/LOWER EXTREMITY;  Surgeon: Elam Dutch, MD;  Location: [facility];  Service: Cardiovascular;  Laterality: Bilateral;  . EYE SURGERY Right 05/06/2020   Pneumatic retinopexy for rheg. RD repair - Dr. Bernarda Caffey  .  EYE SURGERY Right 05/26/2020   PPV/MP - Dr. Bernarda Caffey  . GAS INSERTION Right 05/26/2020   Procedure: INSERTION OF GAS;  Surgeon: Bernarda Caffey, MD;  Location: [facility];  Service: Ophthalmology;  Laterality: Right;  . MEMBRANE PEEL Right 05/26/2020   Procedure: MEMBRANE PEEL;  Surgeon: Bernarda Caffey, MD;  Location: [facility];  Service: Ophthalmology;  Laterality: Right;  . PARS PLANA VITRECTOMY Right 05/26/2020   Procedure: PARS PLANA VITRECTOMY WITH 25 GAUGE;  Surgeon: Bernarda Caffey, MD;  Location: [facility];  Service: Ophthalmology;  Laterality: Right;  . PHOTOCOAGULATION Right 05/26/2020   Procedure: PHOTOCOAGULATION;  Surgeon: Bernarda Caffey, MD;  Location: [facility];  Service: Ophthalmology;  Laterality: Right;  . PROSTATE BIOPSY  2015  . RETINAL DETACHMENT SURGERY Right 05/06/2020   Pneumatic retinopexy for rheg RD repair - Dr. Bernarda Caffey  . RETINAL DETACHMENT SURGERY Right 05/26/2020   PPV/MP - Dr. Bernarda Caffey    Current Outpatient Medications on File Prior to Visit  Medication Sig Dispense Refill  . atorvastatin (LIPITOR) 40 MG tablet Take 1 tablet (40 mg total) by mouth daily. 30 tablet 0  . clopidogrel (PLAVIX) 75 MG tablet TAKE (1) TABLET BY MOUTH ONCE DAILY. 90 tablet 0  . dorzolamide-timolol (COSOPT) 22.3-6.8 MG/ML ophthalmic solution Place 1 drop into the right eye 2 (two) times daily.    . famotidine (PEPCID) 20 MG tablet One daily (Patient taking differently: Take 20 mg by mouth daily.) 90 tablet  3  . FLUoxetine (PROZAC) 20 MG capsule TAKE 1 CAPSULE BY MOUTH EVERY MORNING 90 capsule 0  . irbesartan (AVAPRO) 300 MG tablet Take 1 tablet (300 mg total) by mouth daily. 30 tablet 0  . Multiple Vitamins-Minerals (MULTIVITAMIN WITH MINERALS) tablet Take 1 tablet by mouth daily.    . prednisoLONE acetate (PRED FORTE) 1 % ophthalmic suspension Place 1 drop into the right eye 4 (four) times daily. 15 mL 0  . simvastatin (ZOCOR) 40 MG tablet Take 40 mg by mouth daily.     . tamsulosin (FLOMAX)  0.4 MG CAPS capsule Take 1 capsule (0.4 mg total) by mouth daily. 30 capsule 3   No current facility-administered medications on file prior to visit.     Review of systems: He has no shortness of breath.  He has no chest pain.  Physical exam:  Vitals:   12/08/20 0822  BP: (!) 152/82  Pulse: 65  Resp: 20  Temp: 98.2 F (36.8 C)  SpO2: 95%  Weight: 265 lb (120.2 kg)  Height: 6' (1.829 m)    Extremities: 2+ femoral pulses absent popliteal and pedal pulses bilaterally  Skin: Mild erythema edge of nail bed right first toe no expressible purulence no fluctuance  Data: Patient had bilateral ABIs performed today which were 0.55 on the right 0.79 on the left.  I reviewed and interpreted the study.  These are fairly similar to his previous ABIs June 2021 although the right leg may be slightly decreased.  Assessment: Lifestyle limiting claudication bilateral lower extremities.  I discussed with patient today the options of bilateral femoral above-knee popliteal bypass to improve his symptoms.  He is not a candidate for Endo intervention in the left leg due to the flush occlusion of the SFA.  However, he would be a candidate for atherectomy of his right SFA and reballooning of his right SFA stent.  We discussed that the durability of this is about 50% 1 year.  We also discussed durability of the femoral above-knee popliteal bypass is about 85 to 90% 5-year.  We also discussed that the trade off for this would be a bigger operation upfront with hospitalization of 3 to 5 days with bypass versus same-day surgery for his arteriogram.  Plan: Patient has opted for an intervention in the right lower extremity with potential atherectomy of the right superficial femoral artery and angioplasty and stenting.  Risk benefits possible complications and procedure details were discussed with the patient today including not limited to bleeding infection possibility of being unable to technically really open the  right superficial femoral artery and potentially injuring the vessel.  Other possible complications bleeding contrast reaction.  He understands and agrees to proceed.  His arteriogram is scheduled for Friday, Dec 16, 2020.  Ruta Hinds, MD [facility] Office: [phone]

## 2020-12-12 ENCOUNTER — Other Ambulatory Visit: Payer: Self-pay

## 2020-12-12 ENCOUNTER — Encounter (INDEPENDENT_AMBULATORY_CARE_PROVIDER_SITE_OTHER): Payer: Self-pay | Admitting: Ophthalmology

## 2020-12-12 ENCOUNTER — Ambulatory Visit (INDEPENDENT_AMBULATORY_CARE_PROVIDER_SITE_OTHER): Payer: PPO | Admitting: Ophthalmology

## 2020-12-12 DIAGNOSIS — H35033 Hypertensive retinopathy, bilateral: Secondary | ICD-10-CM

## 2020-12-12 DIAGNOSIS — H35371 Puckering of macula, right eye: Secondary | ICD-10-CM | POA: Diagnosis not present

## 2020-12-12 DIAGNOSIS — H3321 Serous retinal detachment, right eye: Secondary | ICD-10-CM | POA: Diagnosis not present

## 2020-12-12 DIAGNOSIS — H25812 Combined forms of age-related cataract, left eye: Secondary | ICD-10-CM

## 2020-12-12 DIAGNOSIS — H33312 Horseshoe tear of retina without detachment, left eye: Secondary | ICD-10-CM | POA: Diagnosis not present

## 2020-12-12 DIAGNOSIS — H3581 Retinal edema: Secondary | ICD-10-CM

## 2020-12-12 DIAGNOSIS — I10 Essential (primary) hypertension: Secondary | ICD-10-CM

## 2020-12-12 DIAGNOSIS — H25813 Combined forms of age-related cataract, bilateral: Secondary | ICD-10-CM

## 2020-12-12 DIAGNOSIS — Z961 Presence of intraocular lens: Secondary | ICD-10-CM

## 2020-12-15 ENCOUNTER — Other Ambulatory Visit (HOSPITAL_COMMUNITY)
Admission: RE | Admit: 2020-12-15 | Discharge: 2020-12-15 | Disposition: A | Discharge status: Home / Self Care | Payer: PPO | Source: Ambulatory Visit | Attending: Student | Admitting: Student

## 2020-12-15 DIAGNOSIS — Z20822 Contact with and (suspected) exposure to covid-19: Secondary | ICD-10-CM | POA: Insufficient documentation

## 2020-12-15 DIAGNOSIS — Z01812 Encounter for preprocedural laboratory examination: Secondary | ICD-10-CM | POA: Diagnosis not present

## 2020-12-16 ENCOUNTER — Encounter (HOSPITAL_COMMUNITY)
Admission: RE | Disposition: A | Discharge status: Home / Self Care | Payer: Self-pay | Source: Home / Self Care | Attending: Vascular Surgery

## 2020-12-16 ENCOUNTER — Ambulatory Visit (HOSPITAL_COMMUNITY)
Admission: RE | Admit: 2020-12-16 | Discharge: 2020-12-16 | Disposition: A | Discharge status: Home / Self Care | Payer: PPO | Attending: Vascular Surgery | Admitting: Vascular Surgery

## 2020-12-16 DIAGNOSIS — Z79899 Other long term (current) drug therapy: Secondary | ICD-10-CM | POA: Insufficient documentation

## 2020-12-16 DIAGNOSIS — Z7902 Long term (current) use of antithrombotics/antiplatelets: Secondary | ICD-10-CM | POA: Insufficient documentation

## 2020-12-16 DIAGNOSIS — I70213 Atherosclerosis of native arteries of extremities with intermittent claudication, bilateral legs: Secondary | ICD-10-CM | POA: Diagnosis not present

## 2020-12-16 DIAGNOSIS — I70211 Atherosclerosis of native arteries of extremities with intermittent claudication, right leg: Secondary | ICD-10-CM

## 2020-12-16 DIAGNOSIS — Z8616 Personal history of COVID-19: Secondary | ICD-10-CM | POA: Diagnosis not present

## 2020-12-16 HISTORY — PX: ABDOMINAL AORTOGRAM W/LOWER EXTREMITY: CATH118223

## 2020-12-16 HISTORY — PX: PERIPHERAL VASCULAR INTERVENTION: CATH118257

## 2020-12-16 LAB — POCT I-STAT, CHEM 8
BUN: 22 mg/dL (ref 8–23)
Calcium, Ion: 1.25 mmol/L (ref 1.15–1.40)
Chloride: 103 mmol/L (ref 98–111)
Creatinine, Ser: 0.7 mg/dL (ref 0.61–1.24)
Glucose, Bld: 108 mg/dL — ABNORMAL HIGH (ref 70–99)
HCT: 41 % (ref 39.0–52.0)
Hemoglobin: 13.9 g/dL (ref 13.0–17.0)
Potassium: 4.3 mmol/L (ref 3.5–5.1)
Sodium: 139 mmol/L (ref 135–145)
TCO2: 24 mmol/L (ref 22–32)

## 2020-12-16 LAB — POCT ACTIVATED CLOTTING TIME
Activated Clotting Time: 267 seconds
Activated Clotting Time: 309 seconds
Activated Clotting Time: 237 seconds
Activated Clotting Time: 267 seconds
Activated Clotting Time: 214 seconds
Activated Clotting Time: 172 seconds
Activated Clotting Time: 190 seconds

## 2020-12-16 LAB — SARS CORONAVIRUS 2 (TAT 6-24 HRS): SARS Coronavirus 2: NEGATIVE

## 2020-12-16 SURGERY — ABDOMINAL AORTOGRAM W/LOWER EXTREMITY
Anesthesia: LOCAL | Laterality: Right

## 2020-12-16 MED ORDER — HEPARIN SODIUM (PORCINE) 1000 UNIT/ML IJ SOLN
INTRAMUSCULAR | Status: AC
  Filled 2020-12-16: qty 1

## 2020-12-16 MED ORDER — FENTANYL CITRATE (PF) 100 MCG/2ML IJ SOLN
INTRAMUSCULAR | Status: AC
  Filled 2020-12-16: qty 2

## 2020-12-16 MED ORDER — HEPARIN SODIUM (PORCINE) 1000 UNIT/ML IJ SOLN
INTRAMUSCULAR | Status: DC | PRN
  Administered 2020-12-16: 12000 [IU] via INTRAVENOUS
  Administered 2020-12-16: 5000 [IU] via INTRAVENOUS

## 2020-12-16 MED ORDER — LIDOCAINE HCL (PF) 1 % IJ SOLN
INTRAMUSCULAR | Status: DC | PRN
  Administered 2020-12-16: 15 mL via INTRADERMAL

## 2020-12-16 MED ORDER — IODIXANOL 320 MG/ML IV SOLN
INTRAVENOUS | Status: DC | PRN
  Administered 2020-12-16: 180 mL via INTRA_ARTERIAL

## 2020-12-16 MED ORDER — SODIUM CHLORIDE 0.9 % IV SOLN: INTRAVENOUS | Status: AC

## 2020-12-16 MED ORDER — MORPHINE SULFATE (PF) 2 MG/ML IV SOLN
2.0000 mg | INTRAVENOUS | Status: DC | PRN
Start: 2020-12-16 — End: 2020-12-17
  Administered 2020-12-16: 2 mg via INTRAVENOUS

## 2020-12-16 MED ORDER — ASPIRIN 81 MG PO CHEW
CHEWABLE_TABLET | ORAL | Status: AC
  Filled 2020-12-16: qty 1

## 2020-12-16 MED ORDER — LIDOCAINE HCL (PF) 1 % IJ SOLN
INTRAMUSCULAR | Status: AC
  Filled 2020-12-16: qty 30

## 2020-12-16 MED ORDER — CLOPIDOGREL BISULFATE 75 MG PO TABS
ORAL_TABLET | ORAL | Status: DC | PRN
  Administered 2020-12-16: 75 mg via ORAL

## 2020-12-16 MED ORDER — MIDAZOLAM HCL 2 MG/2ML IJ SOLN
INTRAMUSCULAR | Status: DC | PRN
Start: 2020-12-16 — End: 2020-12-16
  Administered 2020-12-16 (×2): 2 mg via INTRAVENOUS

## 2020-12-16 MED ORDER — HEPARIN (PORCINE) IN NACL 1000-0.9 UT/500ML-% IV SOLN
INTRAVENOUS | Status: DC | PRN
  Administered 2020-12-16 (×2): 500 mL

## 2020-12-16 MED ORDER — MIDAZOLAM HCL 2 MG/2ML IJ SOLN
INTRAMUSCULAR | Status: AC
  Filled 2020-12-16: qty 2

## 2020-12-16 MED ORDER — ASPIRIN 81 MG PO CHEW
CHEWABLE_TABLET | ORAL | Status: DC | PRN
  Administered 2020-12-16: 81 mg via ORAL

## 2020-12-16 MED ORDER — ASPIRIN EC 81 MG PO TBEC: 81.0000 mg | DELAYED_RELEASE_TABLET | Freq: Every day | ORAL | 2 refills | Status: AC

## 2020-12-16 MED ORDER — CLOPIDOGREL BISULFATE 75 MG PO TABS
ORAL_TABLET | ORAL | Status: AC
  Filled 2020-12-16: qty 1

## 2020-12-16 MED ORDER — HEPARIN (PORCINE) IN NACL 1000-0.9 UT/500ML-% IV SOLN
INTRAVENOUS | Status: AC
  Filled 2020-12-16: qty 1000

## 2020-12-16 MED ORDER — FENTANYL CITRATE (PF) 100 MCG/2ML IJ SOLN
INTRAMUSCULAR | Status: DC | PRN
  Administered 2020-12-16 (×2): 50 ug via INTRAVENOUS

## 2020-12-16 MED ORDER — MORPHINE SULFATE (PF) 2 MG/ML IV SOLN
INTRAVENOUS | Status: AC
  Filled 2020-12-16: qty 1

## 2020-12-16 MED ORDER — SODIUM CHLORIDE 0.9 % IV SOLN: INTRAVENOUS | Status: DC

## 2020-12-16 MED ORDER — OXYCODONE HCL 5 MG PO TABS
5.0000 mg | ORAL_TABLET | ORAL | Status: DC | PRN
  Administered 2020-12-16: 10 mg via ORAL
  Filled 2020-12-16: qty 2

## 2020-12-16 SURGICAL SUPPLY — 34 items
BAG SNAP BAND KOVER 36X36 (MISCELLANEOUS) ×3 IMPLANT
BALLN JADE .018 5.0 X 60 (BALLOONS) ×3
BALLN MUSTANG 5X150X135 (BALLOONS) ×3
BALLN MUSTANG 5X200X135 (BALLOONS) ×3
BALLN MUSTANG 7.0X40 135 (BALLOONS) ×3
BALLOON JADE .018 5.0 X 60 (BALLOONS) ×2 IMPLANT
BALLOON MUSTANG 5X150X135 (BALLOONS) ×2 IMPLANT
BALLOON MUSTANG 5X200X135 (BALLOONS) ×2 IMPLANT
BALLOON MUSTANG 7.0X40 135 (BALLOONS) ×2 IMPLANT
CATH ANGIO 5F PIGTAIL 65CM (CATHETERS) ×3 IMPLANT
CATH CROSS OVER TEMPO 5F (CATHETERS) ×3 IMPLANT
CATH CXI SUPP 2.6F 150 ST (CATHETERS) ×3 IMPLANT
CATH NAVICROSS ANGLED 135CM (MICROCATHETER) ×3 IMPLANT
CATH TEMPO AQUA 5F 100CM (CATHETERS) ×3 IMPLANT
DEVICE CONTINUOUS FLUSH (MISCELLANEOUS) ×3 IMPLANT
GLIDEWIRE ADV .035X260CM (WIRE) ×3 IMPLANT
GUIDEWIRE ANGLED .035X260CM (WIRE) ×3 IMPLANT
GUIDEWIRE ZILIENT 6G 018 (WIRE) ×3 IMPLANT
KIT ENCORE 26 ADVANTAGE (KITS) ×3 IMPLANT
KIT PV (KITS) ×3 IMPLANT
NEEDLE 18GX3-1/2 9CM (NEEDLE) ×3 IMPLANT
SHEATH PINNACLE 5F 10CM (SHEATH) ×3 IMPLANT
SHEATH PINNACLE MP 7F 45CM (SHEATH) ×3 IMPLANT
SHEATH PINNACLE ST 7F 45CM (SHEATH) ×3 IMPLANT
SHEATH PROBE COVER 6X72 (BAG) ×3 IMPLANT
STENT ELUVIA 6X120X130 (Permanent Stent) ×9 IMPLANT
STENT VIABAHN 6X50X120 (Permanent Stent) ×3 IMPLANT
SYR MEDRAD MARK V 150ML (SYRINGE) ×3 IMPLANT
TRANSDUCER W/STOPCOCK (MISCELLANEOUS) ×3 IMPLANT
TRAY PV CATH (CUSTOM PROCEDURE TRAY) ×3 IMPLANT
WIRE G V18X300CM (WIRE) ×3 IMPLANT
WIRE HI TORQ VERSACORE 300 (WIRE) ×3 IMPLANT
WIRE HITORQ VERSACORE ST 145CM (WIRE) ×3 IMPLANT
WIRE ROSEN-J .035X180CM (WIRE) ×3 IMPLANT

## 2020-12-16 NOTE — Progress Notes (Signed)
Dr Oneida Alar called and informed pt ambulated in hallway. Swelling noted to left scrotum.  States to tell him to ice his scrotum and put his legs up. To call  Dr if needed and that he will call to check on him tomorrow. Pt voice understanding.

## 2020-12-16 NOTE — Op Note (Signed)
Procedure: Abdominal aortogram with bilateral lower extremity runoff, right superficial femoral artery stent  Preoperative diagnosis: Claudication  Post operative diagnosis: Same  Anesthesia: Local with IV sedation  Sedation time 148 minutes  Contrast 180 cc  Operative findings: 1.  Severe calcific occlusion right superficial femoral artery recanalized and stented with overlapping 6 x 120 Eluvia x3 and relining of proximal SFA stent with 6 x 50 Viabahn  2.  Three-vessel runoff to the right foot  3.  Flush occlusion left superficial femoral artery reconstitution above-knee popliteal artery two-vessel runoff PT peroneal left foot  Operative details: After pain informed consent, the patient was taken the PV lab.  The patient was placed in supine position angio table.  Both groins were prepped and draped in usual sterile fashion.  Local anesthesia was infiltrated over the left common femoral artery.  Ultrasound was used to localize the left common femoral artery.  An introducer needle was used to cannulate the artery and there was good backbleeding but the stick was fairly low and I could not get the wire to advance therefore this was removed and hemostasis obtained with direct pressure.  Due to the patient's pannus and obese abdomen we ended up using a longer introducer needle.  Common femoral artery was fairly calcified so I was able to stick this under fluoroscopic guidance.  I was then able to advance an 035 versa core wire up into the abdominal aorta under fluoroscopic guidance.  A 5 French sheath was then placed over the guidewire in the left common femoral artery and thoroughly flushed with heparinized saline.  A 5 French pigtail catheter was advanced over the guidewire into the abdominal aorta.  Abdominal aortogram was obtained in AP projection.  Left and right renal arteries are patent.  Infrarenal abdominal aorta is patent.  Left and right common external and internal iliac arteries are all  patent.  A 30 degree RAO oblique view was performed of the pelvis to confirm the findings of the iliac arteries.  At this point bilateral lower extremity runoff views were obtained through the pigtail catheter.  In the left lower extremity, the left common femoral artery is patent.  The left profunda is patent.  There is a flush occlusion of the left superficial femoral artery.  The above-knee popliteal artery reconstitutes via profunda collaterals.  There is then what appears to be two-vessel runoff via the posterior tibial and peroneal artery although the contrast opacification on the left side was not as intense as on the right.  In the right lower extremity the right common femoral artery is patent.  The right profunda is patent but there are distal stenoses out in the tertiary branches.  The right superficial femoral artery is patent.  There is a stent which extends from just after the origin of the right superficial femoral artery which has in-stent restenosis of about 50 to 75% in multiple segments.  The right superficial femoral artery is diffusely diseased with multiple segments of short segment occlusion.  There is then a Weikel 7 cm occlusion with severe calcific disease at the adductor hiatus.  The above-knee popliteal artery then reconstitutes and there is three-vessel runoff to the right foot.  At this point I decided to intervene on the right leg lesion.  The pigtail catheter was swapped out for a 5 [place] crossover catheter and an 035 angled Glidewire advanced into the mid right superficial femoral artery under fluoroscopic guidance.  I then advanced a crossover catheter to the mid superficial  femoral artery and this was swapped out for a [employer] wire.  The 5 French sheath was then swapped out for a 7 [place] destination sheath which I advanced up and over the aortic bifurcation down into the distal common femoral artery.  The patient was then given 12,000 units of intravenous heparin.  He was  also given an additional 5000 units of heparin during the course of the case to maintain an ACT greater than 250.  I then used a V 18 wire to try to cross the lesion so that we could potentially do atherectomy.  However due to the severe calcific disease I was unable to get this wire to cross easily and I knew we would probably have to do a subintimal procedure.  The V 18 wire was removed.  I then swapped this out for an 035 angled Glidewire.  This was advanced across multiple segments of the proximal occlusions in the mid segment of the SFA but the calcified segment at the SFA was fairly resistant.  I was using a 5 [place] straight catheter for support and could not get the wire to cross this completely.  The angled Glidewire was then swapped out for an 035 Glidewire advantage.  I was able to finally get this to cross over the SFA lesion and then we confirmed with contrast angiogram we were within the true lumen in the popliteal artery.  We then proceeded to balloon the lesion with a 5X200 balloon.  However, I got down to the level of the adductor hiatus that balloon would not cross.  Therefore we inflated the balloon initially just above the adductor hiatus to get less friction from the other areas of stenosis.  This was inflated to nominal pressure for 1 minute.  It was then deflated.  We then did advance the balloon and at this point the sheath pushed back up into the aorta.  We were able to pull the sheath down without losing our wire access.  On the second attempt I was able to get the 5 balloon to cross all the way across this occlusion as well and then we then inflated the balloon to nominal pressure for 1 minute.  Angiogram was performed which showed that most of the disease was above the level of the patella.  I then proceeded to place 3 overlapping 6 x 120 Eluvia drug stents.  These were all postdilated with a 5 balloon.  There was still 1 area of about 40 to 50% waist at the adductor hiatus.  Therefore  this was read ballooned with a 7 x 40 balloon.  The waist was improved but there was probably still about a 20% residual stenosis in this area but we did have a small perforation I did not feel like going to a larger balloon at this point.  Contrast angiogram was then performed at the top end of the previous stent.  There was in-stent restenosis and we ballooned this with a 5 balloon.  Angiogram showed that the stenosis was fairly resistant to this.  Therefore I brought up a 6 x 50 Viabahn stent the 035 Glidewire advantage was swapped out for a 018 zilient wire.  I then deployed the Viabahn over this and this was postdilated with a 5 balloon as well.  Completion angiogram showed wide patency of the entire superficial femoral artery from the origin all the way down to the adductor hiatus.  Popliteal artery was patent and there was preserved three-vessel runoff to the right foot.  This point the resilient wire was removed and the sheath pulled back to an 035 embolectomy cath.  This was left in place to be pulled in the holding area.  The patient taught procedure well no complications.  Patient sent home in stable condition.  Management: Patient now has With right superficial femoral artery with three-vessel runoff to the right foot.  He will need maintained on aspirin and Plavix lifetime.  He has a flush occlusion of the left superficial femoral artery.  If he decides in the future that he would like to have a left femoral to above-knee popliteal bypass his anatomy probably would be suitable for this.  I will arrange for the patient to come back to see me on January 12, 2021.  At that point we will obtain repeat ABIs and have further discussions regarding his left leg.  Ruta Hinds, MD [facility] Office: [phone]

## 2020-12-16 NOTE — Progress Notes (Addendum)
Site area: left groin arterial sheath Site Prior to Removal:  Level 0 Pressure Applied For: 1 hour 20 minutes Manual:   yes Patient Status During Pull:  stable Post Pull Site:  Level 1 hematoma medial to site; marked Post Pull Instructions Given:  yes Post Pull Pulses Present: left dp dopplered Dressing Applied:  Gauze and tegaderm Bedrest begins @ 1450 Comments: small hematoma formed medial to site, expressed. C/O tenderness medially and above site; bruising lateral to site. Dr. Oneida Alar notified and to come see. Medicated for left groin pain. Sherlyn Lick took over manual hold at 65 minute hold. Dr. Oneida Alar here at 1450 to see patient; ? Ligament; OK to go to [facility].

## 2020-12-16 NOTE — Interval H&P Note (Signed)
History and Physical Interval Note:  12/16/2020 7:29 AM  Todd Weeks  has presented today for surgery, with the diagnosis of PAD.  The various methods of treatment have been discussed with the patient and family. After consideration of risks, benefits and other options for treatment, the patient has consented to  Procedure(s): ABDOMINAL AORTOGRAM W/LOWER EXTREMITY (N/A) as a surgical intervention.  The patient's history has been reviewed, patient examined, no change in status, stable for surgery.  I have reviewed the patient's chart and labs.  Questions were answered to the patient's satisfaction.     Ruta Hinds

## 2020-12-17 ENCOUNTER — Emergency Department (HOSPITAL_COMMUNITY)
Admission: EM | Admit: 2020-12-17 | Discharge: 2020-12-17 | Disposition: A | Discharge status: Home / Self Care | Payer: PPO | Source: Home / Self Care | Attending: Emergency Medicine | Admitting: Emergency Medicine

## 2020-12-17 ENCOUNTER — Encounter (HOSPITAL_COMMUNITY): Payer: Self-pay | Admitting: Emergency Medicine

## 2020-12-17 ENCOUNTER — Emergency Department (HOSPITAL_COMMUNITY)
Admission: EM | Admit: 2020-12-17 | Discharge: 2020-12-17 | Disposition: A | Discharge status: Home / Self Care | Payer: PPO | Attending: Emergency Medicine | Admitting: Emergency Medicine

## 2020-12-17 ENCOUNTER — Other Ambulatory Visit: Payer: Self-pay

## 2020-12-17 ENCOUNTER — Emergency Department (HOSPITAL_BASED_OUTPATIENT_CLINIC_OR_DEPARTMENT_OTHER): Payer: PPO

## 2020-12-17 DIAGNOSIS — Z5321 Procedure and treatment not carried out due to patient leaving prior to being seen by health care provider: Secondary | ICD-10-CM | POA: Insufficient documentation

## 2020-12-17 DIAGNOSIS — S301XXA Contusion of abdominal wall, initial encounter: Secondary | ICD-10-CM | POA: Insufficient documentation

## 2020-12-17 DIAGNOSIS — X58XXXA Exposure to other specified factors, initial encounter: Secondary | ICD-10-CM | POA: Insufficient documentation

## 2020-12-17 DIAGNOSIS — N5089 Other specified disorders of the male genital organs: Secondary | ICD-10-CM | POA: Insufficient documentation

## 2020-12-17 DIAGNOSIS — Z7982 Long term (current) use of aspirin: Secondary | ICD-10-CM | POA: Insufficient documentation

## 2020-12-17 DIAGNOSIS — Z87891 Personal history of nicotine dependence: Secondary | ICD-10-CM | POA: Insufficient documentation

## 2020-12-17 DIAGNOSIS — I10 Essential (primary) hypertension: Secondary | ICD-10-CM | POA: Insufficient documentation

## 2020-12-17 DIAGNOSIS — Z79899 Other long term (current) drug therapy: Secondary | ICD-10-CM | POA: Insufficient documentation

## 2020-12-17 DIAGNOSIS — I2510 Atherosclerotic heart disease of native coronary artery without angina pectoris: Secondary | ICD-10-CM | POA: Insufficient documentation

## 2020-12-17 DIAGNOSIS — Z7902 Long term (current) use of antithrombotics/antiplatelets: Secondary | ICD-10-CM | POA: Insufficient documentation

## 2020-12-17 DIAGNOSIS — M7981 Nontraumatic hematoma of soft tissue: Secondary | ICD-10-CM | POA: Diagnosis not present

## 2020-12-17 NOTE — ED Notes (Signed)
Vascular US at bedside.

## 2020-12-17 NOTE — ED Provider Notes (Signed)
[facility] EMERGENCY DEPARTMENT Provider Note   CSN: [id number] Arrival date & time: 12/17/20  0635     History Chief Complaint  Patient presents with  . Left Testicular Swelling    IMIR BRUMBACH is a 65 y.o. male.  Patient is a 66 year old male with a history of PAD status post aortogram with bilateral lower extremity runoff yesterday and stent placed in the right lower extremity and currently on Plavix, hypertension, CAD, hyperlipidemia who is presenting today postop day 1 from aortogram due to ongoing swelling, bruising in the left groin and left side of the testicle.  Patient reports there was a significant amount of bleeding yesterday after the procedure and he did have some bruising there he was just concerned that it may be bigger and wanted someone to take another look at it.  It is only tender when he stands to walk but noticed there was a hard area in the left side of his testicle and he was worried.  He has some mild soreness in the right lower extremity where the stent was placed but has no pain in the left lower extremity.  He has not had any lightheadedness, dizziness, near syncope, palpitations, chest pain or shortness of breath.  He has been able to urinate normally.  The history is provided by the patient.       Past Medical History:  Diagnosis Date  . Bell's palsy   . Cataract    Mixed form OU  . Closed nondisplaced fracture of neck of third metacarpal bone of right hand 02/04/2017  . Clotting disorder ([place])   . Depression   . Diverticulitis   . GERD (gastroesophageal reflux disease)   . Headache(784.0)   . HTN (hypertension)   . Hyperlipidemia   . Hypertensive retinopathy    OU  . Mild CAD 2012  . PVD (peripheral vascular disease) ([place])   . Sexual dysfunction     Patient Active Problem List   Diagnosis Date Noted  . Retinal detachment, rhegmatogenous, right eye 05/24/2020  . Hyperglycemia 02/18/2020  . BPH (benign prostatic  hyperplasia) 08/21/2019  . Depression, major, single episode, in partial remission ([place]) 08/04/2018  . Anxiety 06/12/2018  . GERD (gastroesophageal reflux disease) 05/01/2018  . Insulin resistance 09/03/2017  . Numbness of right anterior thigh 01/21/2017  . Lesion or mass of paranasal sinuses 01/18/2016  . Hyperlipidemia 05/04/2015  . Atherosclerosis of native arteries of extremity with intermittent claudication ([place]) 05/22/2012  . CAD (coronary artery disease) 02/02/2011  . Elevated PSA 08/06/2007  . Former smoker 08/01/2007  . HTN (hypertension) 08/01/2007    Past Surgical History:  Procedure Laterality Date  . ABDOMINAL AORTOGRAM W/LOWER EXTREMITY Bilateral 02/05/2020   Procedure: ABDOMINAL AORTOGRAM W/LOWER EXTREMITY;  Surgeon: Elam Dutch, MD;  Location: [place];  Service: Cardiovascular;  Laterality: Bilateral;  . EYE SURGERY Right 05/06/2020   Pneumatic retinopexy for rheg. RD repair - Dr. Bernarda Caffey  . EYE SURGERY Right 05/26/2020   PPV/MP - Dr. Bernarda Caffey  . GAS INSERTION Right 05/26/2020   Procedure: INSERTION OF GAS;  Surgeon: Bernarda Caffey, MD;  Location: [place];  Service: Ophthalmology;  Laterality: Right;  . MEMBRANE PEEL Right 05/26/2020   Procedure: MEMBRANE PEEL;  Surgeon: Bernarda Caffey, MD;  Location: [place];  Service: Ophthalmology;  Laterality: Right;  . PARS PLANA VITRECTOMY Right 05/26/2020   Procedure: PARS PLANA VITRECTOMY WITH 25 GAUGE;  Surgeon: Bernarda Caffey, MD;  Location: [place];  Service:  Ophthalmology;  Laterality: Right;  . PHOTOCOAGULATION Right 05/26/2020   Procedure: PHOTOCOAGULATION;  Surgeon: Bernarda Caffey, MD;  Location: [place];  Service: Ophthalmology;  Laterality: Right;  . PROSTATE BIOPSY  2015  . RETINAL DETACHMENT SURGERY Right 05/06/2020   Pneumatic retinopexy for rheg RD repair - Dr. Bernarda Caffey  . RETINAL DETACHMENT SURGERY Right 05/26/2020   PPV/MP - Dr. Bernarda Caffey       Family History  Problem Relation Age of  Onset  . Hypertension Mother   . Deep vein thrombosis Father   . Diabetes Brother   . Tuberculosis Maternal Grandfather   . Emphysema Maternal Grandfather   . Lung disease Brother   . Asthma Son   . Rectal cancer Neg Hx   . Colon cancer Neg Hx     Social History   Tobacco Use  . Smoking status: Former Smoker    Packs/day: 1.50    Years: 46.00    Pack years: 69.00    Types: Cigarettes    Quit date: 12/12/2011    Years since quitting: 9.0  . Smokeless tobacco: Never Used  Vaping Use  . Vaping Use: Never used  Substance Use Topics  . Alcohol use: Yes    Alcohol/week: 4.0 standard drinks    Types: 4 Cans of beer per week    Comment: a couple of beer on a weekend  . Drug use: Not Currently    Types: Cocaine, Marijuana, "Crack" cocaine    Comment: over 25 years ago    Home Medications Prior to Admission medications   Medication Sig Start Date End Date Taking? Authorizing Provider  aspirin EC 81 MG tablet Take 1 tablet (81 mg total) by mouth daily. 12/16/20   Elam Dutch, MD  atorvastatin (LIPITOR) 40 MG tablet Take 1 tablet (40 mg total) by mouth daily. Patient taking differently: Take 40 mg by mouth in the morning. 11/15/20   Vivi Barrack, MD  cephALEXin (KEFLEX) 500 MG capsule Take 1 capsule (500 mg total) by mouth 3 (three) times daily. 12/08/20   Elam Dutch, MD  clopidogrel (PLAVIX) 75 MG tablet TAKE (1) TABLET BY MOUTH ONCE DAILY. Patient taking differently: Take 75 mg by mouth in the morning. TAKE (1) TABLET BY MOUTH ONCE DAILY. 10/04/20   Vivi Barrack, MD  diclofenac Sodium (VOLTAREN) 1 % GEL Apply 1 application topically 3 (three) times daily as needed (leg cramps/spasms).    [provider]  famotidine (PEPCID) 20 MG tablet One daily Patient taking differently: Take 20 mg by mouth in the morning. 01/19/20   Vivi Barrack, MD  FLUoxetine (PROZAC) 20 MG capsule TAKE 1 CAPSULE BY MOUTH EVERY MORNING Patient taking differently: Take 20 mg by mouth in  the morning. 09/06/20   Vivi Barrack, MD  irbesartan (AVAPRO) 300 MG tablet Take 1 tablet (300 mg total) by mouth daily. Patient taking differently: Take 300 mg by mouth in the morning. 11/15/20   Vivi Barrack, MD  Multiple Vitamins-Minerals (MULTIVITAMIN WITH MINERALS) tablet Take 1 tablet by mouth daily.    [provider]  prednisoLONE acetate (PRED FORTE) 1 % ophthalmic suspension Place 1 drop into the right eye 4 (four) times daily. Patient not taking: No sig reported 08/19/20   Bernarda Caffey, MD  tamsulosin (FLOMAX) 0.4 MG CAPS capsule Take 1 capsule (0.4 mg total) by mouth daily. Patient taking differently: Take 0.4 mg by mouth in the morning. 03/16/19   Briscoe Deutscher, DO  Allergies    Patient has no known allergies.  Review of Systems   Review of Systems  All other systems reviewed and are negative.   Physical Exam Updated Vital Signs BP (!) 161/86 (BP Location: Right Arm)   Pulse 90   Temp 99.2 F (37.3 C) (Oral)   Resp 14   Ht 6' (1.829 m)   Wt 117.9 kg   SpO2 97%   BMI 35.26 kg/m   Physical Exam Vitals and nursing note reviewed.  Constitutional:      General: He is not in acute distress.    Appearance: Normal appearance.  HENT:     Head: Normocephalic.     Nose: Nose normal.     Mouth/Throat:     Mouth: Mucous membranes are moist.  Eyes:     Extraocular Movements: Extraocular movements intact.     Pupils: Pupils are equal, round, and reactive to light.  Cardiovascular:     Rate and Rhythm: Normal rate.     Comments: 1+ pulse palpated in bilateral DP and left PT. Pulmonary:     Effort: Pulmonary effort is normal. No respiratory distress.  Genitourinary:    Comments: Hematoma present in the left testicle with mild induration in the left side of the scrotum.  No significant tenderness with palpation Musculoskeletal:     Cervical back: Normal range of motion and neck supple.     Comments: Right lower extremity is warm and pink.  Left lower  extremity with mild coolness but palpable pulse.  Left groin with large hematoma, ecchymosis and swelling.  Not particularly tender with palpation.  Skin:    General: Skin is warm and dry.     Capillary Refill: Capillary refill takes less than 2 seconds.  Neurological:     Mental Status: He is alert. Mental status is at baseline.  Psychiatric:        Mood and Affect: Mood normal.        Behavior: Behavior normal.     ED Results / Procedures / Treatments   Labs (all labs ordered are listed, but only abnormal results are displayed) Labs Reviewed - No data to display  EKG None  Radiology PERIPHERAL VASCULAR CATHETERIZATION  Result Date: 12/16/2020 Procedure: Abdominal aortogram with bilateral lower extremity runoff, right superficial femoral artery stent Preoperative diagnosis: Claudication Post operative diagnosis: Same Anesthesia: Local with IV sedation Sedation time 148 minutes Contrast 180 cc Operative findings: 1.  Severe calcific occlusion right superficial femoral artery recanalized and stented with overlapping 6 x 120 Eluvia x3 and relining of proximal SFA stent with 6 x 50 Viabahn 2.  Three-vessel runoff to the right foot 3.  Flush occlusion left superficial femoral artery reconstitution above-knee popliteal artery two-vessel runoff PT peroneal left foot Operative details: After pain informed consent, the patient was taken the PV lab.  The patient was placed in supine position angio table.  Both groins were prepped and draped in usual sterile fashion.  Local anesthesia was infiltrated over the left common femoral artery.  Ultrasound was used to localize the left common femoral artery.  An introducer needle was used to cannulate the artery and there was good backbleeding but the stick was fairly low and I could not get the wire to advance therefore this was removed and hemostasis obtained with direct pressure.  Due to the patient's pannus and obese abdomen we ended up using a longer  introducer needle.  Common femoral artery was fairly calcified so I was able to stick  this under fluoroscopic guidance.  I was then able to advance an 035 versa core wire up into the abdominal aorta under fluoroscopic guidance.  A 5 French sheath was then placed over the guidewire in the left common femoral artery and thoroughly flushed with heparinized saline.  A 5 French pigtail catheter was advanced over the guidewire into the abdominal aorta.  Abdominal aortogram was obtained in AP projection.  Left and right renal arteries are patent.  Infrarenal abdominal aorta is patent.  Left and right common external and internal iliac arteries are all patent.  A 30 degree RAO oblique view was performed of the pelvis to confirm the findings of the iliac arteries. At this point bilateral lower extremity runoff views were obtained through the pigtail catheter. In the left lower extremity, the left common femoral artery is patent.  The left profunda is patent.  There is a flush occlusion of the left superficial femoral artery.  The above-knee popliteal artery reconstitutes via profunda collaterals.  There is then what appears to be two-vessel runoff via the posterior tibial and peroneal artery although the contrast opacification on the left side was not as intense as on the right. In the right lower extremity the right common femoral artery is patent.  The right profunda is patent but there are distal stenoses out in the tertiary branches.  The right superficial femoral artery is patent.  There is a stent which extends from just after the origin of the right superficial femoral artery which has in-stent restenosis of about 50 to 75% in multiple segments.  The right superficial femoral artery is diffusely diseased with multiple segments of short segment occlusion.  There is then a Yoon 7 cm occlusion with severe calcific disease at the adductor hiatus.  The above-knee popliteal artery then reconstitutes and there is  three-vessel runoff to the right foot. At this point I decided to intervene on the right leg lesion.  The pigtail catheter was swapped out for a 5 [place] crossover catheter and an 035 angled Glidewire advanced into the mid right superficial femoral artery under fluoroscopic guidance.  I then advanced a crossover catheter to the mid superficial femoral artery and this was swapped out for a Rosen wire.  The 5 French sheath was then swapped out for a 7 [place] destination sheath which I advanced up and over the aortic bifurcation down into the distal common femoral artery. The patient was then given 12,000 units of intravenous heparin.  He was also given an additional 5000 units of heparin during the course of the case to maintain an ACT greater than 250.  I then used a V 18 wire to try to cross the lesion so that we could potentially do atherectomy.  However due to the severe calcific disease I was unable to get this wire to cross easily and I knew we would probably have to do a subintimal procedure.  The V 18 wire was removed.  I then swapped this out for an 035 angled Glidewire.  This was advanced across multiple segments of the proximal occlusions in the mid segment of the SFA but the calcified segment at the SFA was fairly resistant.  I was using a 5 [place] straight catheter for support and could not get the wire to cross this completely.  The angled Glidewire was then swapped out for an 035 Glidewire advantage.  I was able to finally get this to cross over the SFA lesion and then we confirmed with contrast angiogram we  were within the true lumen in the popliteal artery.  We then proceeded to balloon the lesion with a 5X200 balloon.  However, I got down to the level of the adductor hiatus that balloon would not cross.  Therefore we inflated the balloon initially just above the adductor hiatus to get less friction from the other areas of stenosis.  This was inflated to nominal pressure for 1 minute.  It was then  deflated.  We then did advance the balloon and at this point the sheath pushed back up into the aorta.  We were able to pull the sheath down without losing our wire access.  On the second attempt I was able to get the 5 balloon to cross all the way across this occlusion as well and then we then inflated the balloon to nominal pressure for 1 minute.  Angiogram was performed which showed that most of the disease was above the level of the patella.  I then proceeded to place 3 overlapping 6 x 120 Eluvia drug stents.  These were all postdilated with a 5 balloon.  There was still 1 area of about 40 to 50% waist at the adductor hiatus.  Therefore this was read ballooned with a 7 x 40 balloon.  The waist was improved but there was probably still about a 20% residual stenosis in this area but we did have a small perforation I did not feel like going to a larger balloon at this point.  Contrast angiogram was then performed at the top end of the previous stent.  There was in-stent restenosis and we ballooned this with a 5 balloon.  Angiogram showed that the stenosis was fairly resistant to this.  Therefore I brought up a 6 x 50 Viabahn stent the 035 Glidewire advantage was swapped out for a 018 zilient wire.  I then deployed the Viabahn over this and this was postdilated with a 5 balloon as well.  Completion angiogram showed wide patency of the entire superficial femoral artery from the origin all the way down to the adductor hiatus.  Popliteal artery was patent and there was preserved three-vessel runoff to the right foot.  This point the resilient wire was removed and the sheath pulled back to an 035 embolectomy cath.  This was left in place to be pulled in the holding area.  The patient taught procedure well no complications.  Patient sent home in stable condition. Management: Patient now has With right superficial femoral artery with three-vessel runoff to the right foot.  He will need maintained on aspirin and Plavix  lifetime. He has a flush occlusion of the left superficial femoral artery.  If he decides in the future that he would like to have a left femoral to above-knee popliteal bypass his anatomy probably would be suitable for this. I will arrange for the patient to come back to see me on January 12, 2021.  At that point we will obtain repeat ABIs and have further discussions regarding his left leg. Ruta Hinds, MD [facility] Office: [phone]   VAS [place] Gloriajean Dell  Result Date: 12/17/2020  ARTERIAL PSEUDOANEURYSM  Patient Name:  DUFF POZZI Holladay  Date of Exam:   12/17/2020 Medical Rec #: [id number]     Accession #:    [id number] Date of Birth: Dec 25, 1955      Patient Gender: M Patient Age:   65Y Exam Location:  [facility] Procedure:      VAS [place] GROIN PSEUDOANEURYSM Referring  Phys: Nevis Analia Zuk --------------------------------------------------------------------------------  Exam: Left groin Indications: Patient complains of bruising. History: S/p catheterization ,known LT PAD. Limitations: Body habitus and tissue properties. Comparison Study: No prior studies. Performing Technologist: Darlin Coco RDMS,RVT  Examination Guidelines: A complete evaluation includes B-mode imaging, spectral Doppler, color Doppler, and power Doppler as needed of all accessible portions of each vessel. Bilateral testing is considered an integral part of a complete examination. Limited examinations for reoccurring indications may be performed as noted. +-----------+----------+--------+------+----------+ Left DuplexPSV (cm/s)WaveformPlaqueComment(s) +-----------+----------+--------+------+----------+ CFA           254                             +-----------+----------+--------+------+----------+ PFA           190                             +-----------+----------+--------+------+----------+ Prox SFA      105                              +-----------+----------+--------+------+----------+ Left Vein comments: Patent.  Findings: A mixed echogenic structure measuring approximately 4.6 cm x 3.4 cm is visualized at the level of the proximal left common femoral artery with ultrasound characteristics of a hematoma.  Summary: No evidence of pseudoaneurysm, AVF or DVT     --------------------------------------------------------------------------------    Preliminary     Procedures Procedures   Medications Ordered in ED Medications - No data to display  ED Course  I have reviewed the triage vital signs and the nursing notes.  Pertinent labs & imaging results that were available during my care of the patient were reviewed by me and considered in my medical decision making (see chart for details).    MDM Rules/Calculators/A&P                          Patient presenting to the emergency room today after having aortogram and bilateral lower extremity evaluation with right lower extremity stent placed yesterday due to ongoing hematoma and swelling in the left groin and scrotum.  Patient reports it was there some yesterday and he is not sure if it is gotten worse and he was worried.  He is not having significant pain.  He still has pulses distally.  Spoke with Dr. Oneida Alar who recommended doing a vascular study to ensure no pseudoaneurysm.  9:35 AM ReSound showing hematoma but no evidence of pseudoaneurysm.  Dr. Oneida Alar wishes to come and see the patient and will wait for his recommendations.  11:17 AM Dr. Oneida Alar evaluated pt and his is clear to go with ongoing hematoma protocol with elevation, ice as needed and no strenuous activity MDM Number of Diagnoses or Management Options   Amount and/or Complexity of Data Reviewed Tests in the radiology section of CPT: ordered and reviewed Independent visualization of images, tracings, or specimens: yes     Final Clinical Impression(s) / ED Diagnoses Final diagnoses:  Groin hematoma,  initial encounter    Rx / DC Orders ED Discharge Orders    None       Blanchie Dessert, MD 12/17/20 1117

## 2020-12-17 NOTE — Discharge Instructions (Addendum)
Femoral Site Care  This sheet gives you information about how to care for yourself after your procedure. Your health care provider may also give you more specific instructions. If you have problems or questions, contact your health care provider. What can I expect after the procedure? After the procedure, it is common to have:  Bruising that usually fades within 1-2 weeks.  Tenderness at the site. Follow these instructions at home: Wound care  Follow instructions from your health care provider about how to take care of your insertion site. Make sure you: ? Wash your hands with soap and water before you change your bandage (dressing). If soap and water are not available, use hand sanitizer. ? Remove your dressing as told by your health care provider. In 24 hours  Do not take baths, swim, or use a hot tub until your health care provider approves.  You may shower 24-48 hours after the procedure or as told by your health care provider. ? Gently wash the site with plain soap and water. ? Pat the area dry with a clean towel. ? Do not rub the site. This may cause bleeding.  Do not apply powder or lotion to the site. Keep the site clean and dry.  Check your femoral site every day for signs of infection. Check for: ? Redness, swelling, or pain. ? Fluid or blood. ? Warmth. ? Pus or a bad smell. Activity  For the first 2-3 days after your procedure, or as [place] as directed: ? Avoid climbing stairs as much as possible. ? Do not squat.  Do not lift anything that is heavier than 10 lb (4.5 kg), or the limit that you are told, until your health care provider says that it is safe. For 5 days  Rest as directed. ? Avoid sitting for a [place] time without moving. Get up to take short walks every 1-2 hours.  Do not drive for 24 hours if you were given a medicine to help you relax (sedative). General instructions  Take over-the-counter and prescription medicines only as told by your health care  provider.  Keep all follow-up visits as told by your health care provider. This is important. Contact a health care provider if you have:  A fever or chills.  You have redness, swelling, or pain around your insertion site. Get help right away if:  The catheter insertion area swells very fast.   You pass out.  You suddenly start to sweat or your skin gets clammy.  The catheter insertion area is bleeding, and the bleeding does not stop when you hold steady pressure on the area.  The area near or just beyond the catheter insertion site becomes pale, cool, tingly, or numb. These symptoms may represent a serious problem that is an emergency. Do not wait to see if the symptoms will go away. Get medical help right away. Call your local emergency services (911 in the [place] Do not drive yourself to the hospital. Summary  After the procedure, it is common to have bruising that usually fades within 1-2 weeks.  Check your femoral site every day for signs of infection.  Do not lift anything that is heavier than 10 lb (4.5 kg), or the limit that you are told, until your health care provider says that it is safe. This information is not intended to replace advice given to you by your health care provider. Make sure you discuss any questions you have with your health care provider. Document Revised: 04/01/2020 Document Reviewed:  04/01/2020 Elsevier Patient Education  2021 [place]. Activity Instructions   You must avoid lifting more than *** pounds until your physician instructs you differently. You should avoid {d/c avoid/resume:120111}. You may resume {d/c avoid/resume:120111}.

## 2020-12-17 NOTE — Consult Note (Addendum)
Hospital Consult    Reason for Consult:  Left testicular swelling after agm Requesting Physician:  ER MRN #:  [id number]  History of Present Illness: This is a 65 y.o. male who underwent Abdominal aortogram with bilateral lower extremity runoff, right superficial femoral artery stent yesterday, 12/16/2020 by Dr. Oneida Alar for claudication.  He did have a small hematoma with ecchymosis with some mild testicular swelling post procedure.  Pressure was held and pt was ultimately discharged home  He presented to the ED this morning with concerns of left testicular swelling and bruising around his left groin.   He states he thinks his groin feels a little harder this morning.  He tells me his testicle hasn't changed much since yesterday.  He states he was elevating it last night. He was concerned so he came to the ER this morning.  He states that his right foot feels much warmer now.  He wants to know why they didn't sedate him for his procedure.    Past Medical History:  Diagnosis Date  . Bell's palsy   . Cataract    Mixed form OU  . Closed nondisplaced fracture of neck of third metacarpal bone of right hand 02/04/2017  . Clotting disorder ([place])   . Depression   . Diverticulitis   . GERD (gastroesophageal reflux disease)   . Headache(784.0)   . HTN (hypertension)   . Hyperlipidemia   . Hypertensive retinopathy    OU  . Mild CAD 2012  . PVD (peripheral vascular disease) ([place])   . Sexual dysfunction     Past Surgical History:  Procedure Laterality Date  . ABDOMINAL AORTOGRAM W/LOWER EXTREMITY Bilateral 02/05/2020   Procedure: ABDOMINAL AORTOGRAM W/LOWER EXTREMITY;  Surgeon: Elam Dutch, MD;  Location: [facility];  Service: Cardiovascular;  Laterality: Bilateral;  . EYE SURGERY Right 05/06/2020   Pneumatic retinopexy for rheg. RD repair - Dr. Bernarda Caffey  . EYE SURGERY Right 05/26/2020   PPV/MP - Dr. Bernarda Caffey  . GAS INSERTION Right 05/26/2020   Procedure: INSERTION OF  GAS;  Surgeon: Bernarda Caffey, MD;  Location: [facility];  Service: Ophthalmology;  Laterality: Right;  . MEMBRANE PEEL Right 05/26/2020   Procedure: MEMBRANE PEEL;  Surgeon: Bernarda Caffey, MD;  Location: [facility];  Service: Ophthalmology;  Laterality: Right;  . PARS PLANA VITRECTOMY Right 05/26/2020   Procedure: PARS PLANA VITRECTOMY WITH 25 GAUGE;  Surgeon: Bernarda Caffey, MD;  Location: [facility];  Service: Ophthalmology;  Laterality: Right;  . PHOTOCOAGULATION Right 05/26/2020   Procedure: PHOTOCOAGULATION;  Surgeon: Bernarda Caffey, MD;  Location: [facility];  Service: Ophthalmology;  Laterality: Right;  . PROSTATE BIOPSY  2015  . RETINAL DETACHMENT SURGERY Right 05/06/2020   Pneumatic retinopexy for rheg RD repair - Dr. Bernarda Caffey  . RETINAL DETACHMENT SURGERY Right 05/26/2020   PPV/MP - Dr. Bernarda Caffey    No Known Allergies  Prior to Admission medications   Medication Sig Start Date End Date Taking? Authorizing Provider  aspirin EC 81 MG tablet Take 1 tablet (81 mg total) by mouth daily. 12/16/20   Elam Dutch, MD  atorvastatin (LIPITOR) 40 MG tablet Take 1 tablet (40 mg total) by mouth daily. Patient taking differently: Take 40 mg by mouth in the morning. 11/15/20   Vivi Barrack, MD  cephALEXin (KEFLEX) 500 MG capsule Take 1 capsule (500 mg total) by mouth 3 (three) times daily. 12/08/20   Elam Dutch, MD  clopidogrel (PLAVIX) 75 MG tablet TAKE (1) TABLET  BY MOUTH ONCE DAILY. Patient taking differently: Take 75 mg by mouth in the morning. TAKE (1) TABLET BY MOUTH ONCE DAILY. 10/04/20   Vivi Barrack, MD  diclofenac Sodium (VOLTAREN) 1 % GEL Apply 1 application topically 3 (three) times daily as needed (leg cramps/spasms).    [provider]  famotidine (PEPCID) 20 MG tablet One daily Patient taking differently: Take 20 mg by mouth in the morning. 01/19/20   Vivi Barrack, MD  FLUoxetine (PROZAC) 20 MG capsule TAKE 1 CAPSULE BY MOUTH EVERY MORNING Patient taking differently: Take  20 mg by mouth in the morning. 09/06/20   Vivi Barrack, MD  irbesartan (AVAPRO) 300 MG tablet Take 1 tablet (300 mg total) by mouth daily. Patient taking differently: Take 300 mg by mouth in the morning. 11/15/20   Vivi Barrack, MD  Multiple Vitamins-Minerals (MULTIVITAMIN WITH MINERALS) tablet Take 1 tablet by mouth daily.    [provider]  prednisoLONE acetate (PRED FORTE) 1 % ophthalmic suspension Place 1 drop into the right eye 4 (four) times daily. Patient not taking: No sig reported 08/19/20   Bernarda Caffey, MD  tamsulosin (FLOMAX) 0.4 MG CAPS capsule Take 1 capsule (0.4 mg total) by mouth daily. Patient taking differently: Take 0.4 mg by mouth in the morning. 03/16/19   Briscoe Deutscher, DO    Social History   Socioeconomic History  . Marital status: Single    Spouse name: Not on file  . Number of children: 2  . Years of education: 36  . Highest education level: Not on file  Occupational History  . Occupation: Electrical engineer  . Occupation: Unemployed    Comment: disability  Tobacco Use  . Smoking status: Former Smoker    Packs/day: 1.50    Years: 46.00    Pack years: 69.00    Types: Cigarettes    Quit date: 12/12/2011    Years since quitting: 9.0  . Smokeless tobacco: Never Used  Vaping Use  . Vaping Use: Never used  Substance and Sexual Activity  . Alcohol use: Yes    Alcohol/week: 4.0 standard drinks    Types: 4 Cans of beer per week    Comment: a couple of beer on a weekend  . Drug use: Not Currently    Types: Cocaine, Marijuana, "Crack" cocaine    Comment: over 25 years ago  . Sexual activity: Not on file  Other Topics Concern  . Not on file  Social History Narrative   Raised in [place], [place]. Does not have any religious beliefs that would effect healthcare. Lives in house with sister. Likes to ride motorcycle for fun.    Moving out on his own; in the next couple of weeks will be moving back home that he had rented      [place]; with a helmet    Social Determinants of Health   Financial Resource Strain: [place]   . Difficulty of Paying Living Expenses: Not hard at all  Food Insecurity: No Food Insecurity  . Worried About Charity fundraiser in the Last Year: Never true  . Ran Out of Food in the Last Year: Never true  Transportation Needs: No Transportation Needs  . Lack of Transportation (Medical): No  . Lack of Transportation (Non-Medical): No  Physical Activity: Inactive  . Days of Exercise per Week: 0 days  . Minutes of Exercise per Session: 0 min  Stress: Stress Concern Present  . Feeling of Stress : Rather much  Social  Connections: Socially Isolated  . Frequency of Communication with Friends and Family: More than three times a week  . Frequency of Social Gatherings with Friends and Family: Once a week  . Attends Religious Services: Never  . Active Member of Clubs or Organizations: No  . Attends Archivist Meetings: Never  . Marital Status: Never married  Intimate Partner Violence: Not At Risk  . Fear of Current or Ex-Partner: No  . Emotionally Abused: No  . Physically Abused: No  . Sexually Abused: No    Family History  Problem Relation Age of Onset  . Hypertension Mother   . Deep vein thrombosis Father   . Diabetes Brother   . Tuberculosis Maternal Grandfather   . Emphysema Maternal Grandfather   . Lung disease Brother   . Asthma Son   . Rectal cancer Neg Hx   . Colon cancer Neg Hx     ROS: [x]  Positive   [ ]  Negative   [ ]  All sytems reviewed and are negative Please see HPI  Physical Examination  Vitals:   12/17/20 0730 12/17/20 0800  BP: 138/84 136/77  Pulse: 84 84  Resp: 16 13  Temp:    SpO2: 94% 95%   Body mass index is 35.26 kg/m.  General:  WDWN in NAD Gait: Not observed HENT: WNL, normocephalic Pulmonary: normal non-labored breathing Cardiac: regular Skin:  Ecchymosis left groin/testicle Vascular Exam/Pulses:  Right Left  DP Unable to palpate 1+ (weak)  PT 2+  (normal) Unable to palpate   Extremities:  Bilateral feet are warm.  His left groin is soft with ecchymosis.  I do not feel a pulsatile mass.   Musculoskeletal: no muscle wasting or atrophy  Neurologic: A&O X 3; speech is fluent/normal Psychiatric:  The pt has Normal affect.   CBC    Component Value Date/Time   WBC 7.9 05/26/2020 1305   RBC 4.97 05/26/2020 1305   HGB 13.9 12/16/2020 0545   HCT 41.0 12/16/2020 0545   PLT 232 05/26/2020 1305   MCV 90.9 05/26/2020 1305   MCV 88.6 10/19/2014 0918   MCH 29.6 05/26/2020 1305   MCHC 32.5 05/26/2020 1305   RDW 12.0 05/26/2020 1305   LYMPHSABS 0.3 (L) 02/08/2020 1040   MONOABS 0.4 02/08/2020 1040   EOSABS 0.1 02/08/2020 1040   BASOSABS 0.0 02/08/2020 1040    BMET    Component Value Date/Time   NA 139 12/16/2020 0545   K 4.3 12/16/2020 0545   CL 103 12/16/2020 0545   CO2 23 05/26/2020 1305   GLUCOSE 108 (H) 12/16/2020 0545   BUN 22 12/16/2020 0545   CREATININE 0.70 12/16/2020 0545   CALCIUM 9.3 05/26/2020 1305   GFRNONAA >60 05/26/2020 1305   GFRAA >60 02/08/2020 1040    COAGS: Lab Results  Component Value Date   INR 1.0 04/11/2011   INR 1.0 01/04/2011     Non-Invasive Vascular Imaging:   Duplex ordered and pending   ASSESSMENT/PLAN: This is a 65 y.o. male s/p angiogram via left common femoral artery yesterday 12/16/2020 by Dr. Oneida Alar who presents today with ecchymosis and testicular swelling  -I do not appreciate a pulsatile mass in the left groin.  Duplex has been ordered to evaluate.  His left groin is soft.  There is some mild swelling of the left testicle.  Instructed him to elevate testicle when he is not up and about.   -he has a palpable right PT pulse.  -Dr. Oneida Alar on call MD to see  pt later this morning.  Discussed this with pt. -duplex reveals no evidence of psa, DVT.  Hematoma is present.  Leontine Locket, PA-C [facility] [phone]   Agree with above.  Pt with 1+ PT pulse right  foot, foot is warm.  Some ecchymosis left groin and scrotum no mass.  US shows 3 x 4 cm hematoma no pseudoaneurysm.  Will d/c home.  Ice pack scrotal elevation.  He has follow up appt  Ruta Hinds, MD [facility] Office: [phone]

## 2020-12-17 NOTE — ED Triage Notes (Signed)
Patient reports increasing left testicular swelling onset yesterday after aortogram procedure yesterday .

## 2020-12-17 NOTE — ED Triage Notes (Signed)
Patient reports increasing left testicular swelling onset yesterday after aortogram procedure .

## 2020-12-17 NOTE — Progress Notes (Signed)
Groin pseudoaneurysm ultrasound study completed.  Preliminary results relayed to Maryan Rued, MD.   See CV Proc for preliminary results report.   Darlin Coco, RDMS, RVT

## 2020-12-17 NOTE — Discharge Instructions (Signed)
There is just hematoma in the area where they removed the catheter yesterday.  Everything with your arteries looks normal.

## 2020-12-19 ENCOUNTER — Encounter (HOSPITAL_COMMUNITY): Payer: Self-pay | Admitting: Vascular Surgery

## 2020-12-20 ENCOUNTER — Other Ambulatory Visit: Payer: Self-pay

## 2020-12-20 DIAGNOSIS — I70219 Atherosclerosis of native arteries of extremities with intermittent claudication, unspecified extremity: Secondary | ICD-10-CM

## 2020-12-22 ENCOUNTER — Other Ambulatory Visit: Payer: Self-pay | Admitting: Physician Assistant

## 2020-12-22 ENCOUNTER — Telehealth: Payer: Self-pay

## 2020-12-22 MED ORDER — OXYCODONE-ACETAMINOPHEN 5-325 MG PO TABS: 1.0000 | ORAL_TABLET | ORAL | 0 refills | Status: DC | PRN

## 2020-12-22 NOTE — Telephone Encounter (Signed)
Nurse Assessment Nurse: Evern Bio RN, Coralyn Mark Date/Time Eilene Ghazi Time): 12/21/2020 2:22:02 PM Confirm and document reason for call. If symptomatic, describe symptoms. ---Caller states he is having chest pains for about 10 days. It comes and goes. NO c/o SOB or back pain. Described as chest tightness to heartburn. Angioplasty of Rt leg w/ stent placement x4 done last friday. Currently on Plavix and LD ASA. Does the patient have any new or worsening symptoms? ---Yes Will a triage be completed? ---Yes Related visit to physician within the last 2 weeks? ---Yes Does the PT have any chronic conditions? (i.e. diabetes, asthma, this includes High risk factors for pregnancy, etc.) ---Yes List chronic conditions. ---CAD; Vascular insufficiency Is this a behavioral health or substance abuse call? ---No Guidelines Guideline Title Affirmed Question Affirmed Notes Nurse Date/Time (Eastern Time) Chest Pain [1] Chest pain (or "angina") comes and goes AND [2] is happening more Evern Bio, RNCoralyn Mark 12/21/2020 2:26:25 PM PLEASE NOTE: All timestamps contained within this report are represented as [place] Standard Time. CONFIDENTIALTY NOTICE: This fax transmission is intended only for the addressee. It contains information that is legally privileged, confidential or otherwise protected from use or disclosure. If you are not the intended recipient, you are strictly prohibited from reviewing, disclosing, copying using or disseminating any of this information or taking any action in reliance on or regarding this information. If you have received this fax in error, please notify us immediately by telephone so that we can arrange for its return to [place]. Phone: [phone], Toll-Free: [phone], Fax: [phone] Page: 2 of 2 Call Id: [id number] Guidelines Guideline Title Affirmed Question Affirmed Notes Nurse Date/Time Eilene Ghazi Time) often (increasing in frequency) or getting worse (increasing in severity)  (Exception: chest pains that last only a few seconds) Disp. Time Eilene Ghazi Time) Disposition Final User 12/21/2020 2:20:30 PM Send to Urgent Queue Artis Flock 12/21/2020 2:31:06 PM Go to ED Now Yes Bogard, RN, Loreen Freud Disagree/Comply Comply Caller Understands Yes PreDisposition Did not know what to do Care Advice Given Per Guideline GO TO ED NOW: * You need to be seen in the Emergency Department. * Go to the ED at ___________ [place] now. Drive carefully. ANOTHER ADULT SHOULD DRIVE: * It is better and safer if another adult drives instead of you. Referrals GO TO FACILITY OTHER - SPE[facility]

## 2020-12-22 NOTE — Telephone Encounter (Signed)
Patient called to report pain in his groin after aortogram on 5/6. He had a hematoma and pressure was held. Swelling has been decreasing in size and patient has been using ice packs, but site is still very painful. Denies any problems with the right leg.  Discussed with Dr. Oneida Alar, called in pain medicine to [facility]. Patient aware.

## 2020-12-22 NOTE — Telephone Encounter (Signed)
See note

## 2020-12-29 ENCOUNTER — Other Ambulatory Visit: Payer: Self-pay | Admitting: Family Medicine

## 2020-12-29 DIAGNOSIS — F418 Other specified anxiety disorders: Secondary | ICD-10-CM

## 2021-01-02 ENCOUNTER — Encounter (INDEPENDENT_AMBULATORY_CARE_PROVIDER_SITE_OTHER): Payer: PPO | Admitting: Ophthalmology

## 2021-01-12 ENCOUNTER — Ambulatory Visit: Payer: PPO | Admitting: Vascular Surgery

## 2021-01-12 ENCOUNTER — Encounter (HOSPITAL_COMMUNITY): Payer: PPO

## 2021-01-23 ENCOUNTER — Other Ambulatory Visit: Payer: Self-pay | Admitting: Family Medicine

## 2021-01-23 DIAGNOSIS — I2510 Atherosclerotic heart disease of native coronary artery without angina pectoris: Secondary | ICD-10-CM

## 2021-01-27 ENCOUNTER — Other Ambulatory Visit: Payer: Self-pay | Admitting: Family Medicine

## 2021-01-27 DIAGNOSIS — K219 Gastro-esophageal reflux disease without esophagitis: Secondary | ICD-10-CM

## 2021-01-31 ENCOUNTER — Telehealth: Payer: Self-pay

## 2021-01-31 NOTE — Telephone Encounter (Signed)
Patient left vm about swelling in his foot and ankle. Left return VM advising elevation - instructed to call back if further questions or if leg turned painful, cold, discolored, or any wounds started to develop.

## 2021-02-07 ENCOUNTER — Telehealth: Payer: Self-pay | Admitting: Adult Health

## 2021-02-07 NOTE — Telephone Encounter (Signed)
Called and spoke with patient. He stated that he has noticed an increase in SOB over the last 2 weeks. He has not been able to sleep well at night due to the SOB. He was requesting an appt.   He was last seen by MW back in 2017. I was able to get him scheduled with Dr. Erin Fulling at 130pm tomorrow. He is aware of our office location.   Nothing further needed at time of call.

## 2021-02-08 ENCOUNTER — Other Ambulatory Visit: Payer: Self-pay | Admitting: Family Medicine

## 2021-02-08 ENCOUNTER — Other Ambulatory Visit: Payer: Self-pay

## 2021-02-08 ENCOUNTER — Ambulatory Visit (INDEPENDENT_AMBULATORY_CARE_PROVIDER_SITE_OTHER): Payer: PPO

## 2021-02-08 ENCOUNTER — Encounter: Payer: Self-pay | Admitting: Pulmonary Disease

## 2021-02-08 ENCOUNTER — Ambulatory Visit: Payer: PPO | Admitting: Pulmonary Disease

## 2021-02-08 VITALS — BP 126/82 | HR 71 | Temp 98.1°F | Ht 72.0 in | Wt 242.8 lb

## 2021-02-08 DIAGNOSIS — R0683 Snoring: Secondary | ICD-10-CM | POA: Diagnosis not present

## 2021-02-08 DIAGNOSIS — F418 Other specified anxiety disorders: Secondary | ICD-10-CM

## 2021-02-08 DIAGNOSIS — R0602 Shortness of breath: Secondary | ICD-10-CM

## 2021-02-08 DIAGNOSIS — J432 Centrilobular emphysema: Secondary | ICD-10-CM

## 2021-02-08 NOTE — Patient Instructions (Addendum)
We will check labs for concern of potential blood clots.  If the lab values are elevated then we will check a CT scan of the chest with contrast.  Talk with your vascular surgery team about obtaining an ultrasound of that right leg for concern of blood clot.  We will check a chest x-ray today for the shortness of breath.  Consider downloading the apps Calm or Headspace for free breathing exercises and meditation exercises.  We will schedule you for a home sleep study in the future.

## 2021-02-08 NOTE — Progress Notes (Signed)
Synopsis: Referred in June 2022 for shortness of breath. Former patient of Dr. Melvyn Novas  Subjective:   PATIENT ID: Todd Weeks GENDER: male DOB: [birth year], MRN: [id number]   HPI  Chief Complaint  Patient presents with   Follow-up    Increased SOB in last 2 weeks with and without exertion. He reports not sleeping at times.     Todd Weeks is a 65 year old man, former smoker with mild centrilobular emphysema, GERD, anxiety, hypertension and obesity who returns to pulmonary clinic for evaluation of shortness of breath.   Patient reports that he has had increasing shortness of breath over the last 2 weeks.  The shortness of breath seems to be worse at night when he is trying to sleep.  He denies any cough, sputum production or wheezing with the shortness of breath.  He denies any recent illnesses or sick contacts.  He reports he has increased levels of anxiety due to personal social stressors related to work and his family.  He does report that when he is under a lot of stress he has increased anxiety and also increased shortness of breath.  He had work-up in the past for shortness of breath with normal pulmonary function testing and no response to inhaler therapy.  He has mild centrilobular emphysematous changes on CT chest imaging.  His shortness of breath in the past improved with 60 to 70 pound weight loss which he has gained some weight back since that time.  He recently had a vascular surgery operation of his right lower extremity with stent placements.  He reports the pain is improved in his right leg but he has had increased swelling.  He has a visit with the vascular surgical team tomorrow for follow-up.  He does report snoring at night and expresses some concern for sleep apnea. SOB over past couple of weeks   Past Medical History:  Diagnosis Date   Bell's palsy    Cataract    Mixed form OU   Closed nondisplaced fracture of neck of third metacarpal bone of right hand 02/04/2017    Clotting disorder ([place])    Depression    Diverticulitis    GERD (gastroesophageal reflux disease)    Headache(784.0)    HTN (hypertension)    Hyperlipidemia    Hypertensive retinopathy    OU   Mild CAD 2012   PVD (peripheral vascular disease) ([place])    Sexual dysfunction      Family History  Problem Relation Age of Onset   Hypertension Mother    Deep vein thrombosis Father    Diabetes Brother    Tuberculosis Maternal Grandfather    Emphysema Maternal Grandfather    Lung disease Brother    Asthma Son    Rectal cancer Neg Hx    Colon cancer Neg Hx      Social History   Socioeconomic History   Marital status: Single    Spouse name: Not on file   Number of children: 2   Years of education: 12   Highest education level: Not on file  Occupational History   Occupation: Electrical engineer   Occupation: Unemployed    Comment: disability  Tobacco Use   Smoking status: Former    Packs/day: 1.50    Years: 46.00    Pack years: 69.00    Types: Cigarettes    Quit date: 12/12/2011    Years since quitting: 9.1   Smokeless tobacco: Never  Vaping Use   Vaping Use: Never  used  Substance and Sexual Activity   Alcohol use: Yes    Alcohol/week: 4.0 standard drinks    Types: 4 Cans of beer per week    Comment: a couple of beer on a weekend   Drug use: Not Currently    Types: Cocaine, Marijuana, "Crack" cocaine    Comment: over 25 years ago   Sexual activity: Not on file  Other Topics Concern   Not on file  Social History Narrative   Raised in [place], [place]. Does not have any religious beliefs that would effect healthcare. Lives in house with sister. Likes to ride motorcycle for fun.    Moving out on his own; in the next couple of weeks will be moving back home that he had rented      [employer]; with a helmet   Social Determinants of Health   Financial Resource Strain: Low Risk    Difficulty of Paying Living Expenses: Not hard at all  Food Insecurity: No Food Insecurity    Worried About Charity fundraiser in the Last Year: Never true   [place] in the Last Year: Never true  Transportation Needs: No Transportation Needs   Lack of Transportation (Medical): No   Lack of Transportation (Non-Medical): No  Physical Activity: Inactive   Days of Exercise per Week: 0 days   Minutes of Exercise per Session: 0 min  Stress: Stress Concern Present   Feeling of Stress : Rather much  Social Connections: Socially Isolated   Frequency of Communication with Friends and Family: More than three times a week   Frequency of Social Gatherings with Friends and Family: Once a week   Attends Religious Services: Never   Marine scientist or Organizations: No   Attends Music therapist: Never   Marital Status: Never married  Human resources officer Violence: Not At Risk   Fear of Current or Ex-Partner: No   Emotionally Abused: No   Physically Abused: No   Sexually Abused: No     No Known Allergies   Outpatient Medications Prior to Visit  Medication Sig Dispense Refill   aspirin EC 81 MG tablet Take 1 tablet (81 mg total) by mouth daily. 150 tablet 2   atorvastatin (LIPITOR) 40 MG tablet TAKE 1 TABLET BY MOUTH ONCE A DAY. 30 tablet 0   clopidogrel (PLAVIX) 75 MG tablet TAKE (1) TABLET BY MOUTH ONCE DAILY. 90 tablet 0   diclofenac Sodium (VOLTAREN) 1 % GEL Apply 1 application topically 3 (three) times daily as needed (leg cramps/spasms).     famotidine (PEPCID) 20 MG tablet TAKE 1 TABLET BY MOUTH ONCE DAILY. 90 tablet 0   FLUoxetine (PROZAC) 20 MG capsule TAKE (1) CAPSULE BY MOUTH EACH MORNING. 30 capsule 0   irbesartan (AVAPRO) 300 MG tablet TAKE 1 TABLET BY MOUTH ONCE A DAY. 30 tablet 0   Multiple Vitamins-Minerals (MULTIVITAMIN WITH MINERALS) tablet Take 1 tablet by mouth daily.     oxyCODONE-acetaminophen (PERCOCET) 5-325 MG tablet Take 1 tablet by mouth every 4 (four) hours as needed for severe pain. 8 tablet 0   prednisoLONE acetate (PRED FORTE) 1 %  ophthalmic suspension Place 1 drop into the right eye 4 (four) times daily. 15 mL 0   tamsulosin (FLOMAX) 0.4 MG CAPS capsule Take 1 capsule (0.4 mg total) by mouth daily. (Patient taking differently: Take 0.4 mg by mouth in the morning.) 30 capsule 3   cephALEXin (KEFLEX) 500 MG capsule Take 1 capsule (500 mg  total) by mouth 3 (three) times daily. (Patient not taking: Reported on 02/08/2021) 30 capsule 0   No facility-administered medications prior to visit.    Review of Systems  Constitutional:  Negative for chills, fever, malaise/fatigue and weight loss.  HENT:  Negative for congestion, sinus pain and sore throat.   Eyes: Negative.   Respiratory:  Positive for shortness of breath. Negative for cough, hemoptysis, sputum production and wheezing.   Cardiovascular:  Negative for chest pain, palpitations, orthopnea, claudication and leg swelling.  Gastrointestinal:  Negative for abdominal pain, heartburn, nausea and vomiting.  Genitourinary: Negative.   Musculoskeletal:  Negative for joint pain and myalgias.  Skin:  Negative for rash.  Neurological:  Negative for weakness.  Endo/Heme/Allergies: Negative.   Psychiatric/Behavioral:  The patient is nervous/anxious.      Objective:   Vitals:   02/08/21 1333  BP: 126/82  Pulse: 71  Temp: 98.1 F (36.7 C)  TempSrc: Oral  SpO2: 96%  Weight: 242 lb 12.8 oz (110.1 kg)  Height: 6' (1.829 m)     Physical Exam Constitutional:      General: He is not in acute distress.    Appearance: He is obese.  HENT:     Head: Normocephalic and atraumatic.  Eyes:     Extraocular Movements: Extraocular movements intact.     Conjunctiva/sclera: Conjunctivae normal.     Pupils: Pupils are equal, round, and reactive to light.  Cardiovascular:     Rate and Rhythm: Normal rate and regular rhythm.     Pulses: Normal pulses.     Heart sounds: Normal heart sounds. No murmur heard. Pulmonary:     Effort: Pulmonary effort is normal.     Breath sounds:  Normal breath sounds. No wheezing, rhonchi or rales.  Abdominal:     General: Bowel sounds are normal.     Palpations: Abdomen is soft.  Musculoskeletal:     Right lower leg: No edema.     Left lower leg: No edema.  Lymphadenopathy:     Cervical: No cervical adenopathy.  Skin:    General: Skin is warm and dry.  Neurological:     General: No focal deficit present.     Mental Status: He is alert.  Psychiatric:        Mood and Affect: Mood normal.        Behavior: Behavior normal.        Thought Content: Thought content normal.        Judgment: Judgment normal.      CBC    Component Value Date/Time   WBC 7.9 05/26/2020 1305   RBC 4.97 05/26/2020 1305   HGB 13.9 12/16/2020 0545   HCT 41.0 12/16/2020 0545   PLT 232 05/26/2020 1305   MCV 90.9 05/26/2020 1305   MCV 88.6 10/19/2014 0918   MCH 29.6 05/26/2020 1305   MCHC 32.5 05/26/2020 1305   RDW 12.0 05/26/2020 1305   LYMPHSABS 0.3 (L) 02/08/2020 1040   MONOABS 0.4 02/08/2020 1040   EOSABS 0.1 02/08/2020 1040   BASOSABS 0.0 02/08/2020 1040   BMP Latest Ref Rng & Units 12/16/2020 05/26/2020 05/06/2020  Glucose 70 - 99 mg/dL 108(H) 98 98  BUN 8 - 23 mg/dL 22 16 20   Creatinine 0.61 - 1.24 mg/dL 0.70 0.68 0.70  Sodium 135 - 145 mmol/L 139 136 143  Potassium 3.5 - 5.1 mmol/L 4.3 4.0 4.2  Chloride 98 - 111 mmol/L 103 102 105  CO2 22 - 32 mmol/L - 23 -  Calcium 8.9 - 10.3 mg/dL - 9.3 -   Chest imaging: Lung Cancer Screening CT Chest 08/22/20 Mediastinum/Nodes: No discrete thyroid nodules. Unremarkable esophagus. No pathologically enlarged axillary, mediastinal or hilar lymph nodes, noting limited sensitivity for the detection of hilar adenopathy on this noncontrast study.   Lungs/Pleura: No pneumothorax. No pleural effusion. Mild centrilobular emphysema with mild diffuse bronchial wall thickening. No acute consolidative airspace disease or lung masses. No significant growth of previously visualized scattered  pulmonary nodules. No new significant pulmonary nodules.  PFT: PFT Results Latest Ref Rng & Units 07/04/2018 10/28/2014  FVC-Pre L 5.00 4.61  FVC-Predicted Pre % 97 88  FVC-Post L 5.00 4.66  FVC-Predicted Post % 98 89  Pre FEV1/FVC % % 77 73  Post FEV1/FCV % % 75 74  FEV1-Pre L 3.85 3.35  FEV1-Predicted Pre % 100 85  FEV1-Post L 3.73 3.47  DLCO uncorrected ml/min/mmHg 31.78 30.26  DLCO UNC% % 89 84  DLCO corrected ml/min/mmHg 31.78 -  DLCO COR %Predicted % 89 -  DLVA Predicted % 88 95  TLC L 7.62 7.78  TLC % Predicted % 101 103  RV % Predicted % 107 122  PFT 2019: Pulmonary function testing within normal limits  Echo 04/26/2018: - Normal LV EF, no wall motion abnormalities.  RV systolic function is normal.  Assessment & Plan:   Shortness of breath - Plan: D-Dimer, Quantitative, Comp Met (CMET), DG Chest 2 View  Centrilobular emphysema (HCC)  Snoring - Plan: Home sleep test  Discussion: Todd Weeks is a 65 year old man, former smoker with mild centrilobular emphysema, GERD, anxiety, hypertension and obesity who returns to pulmonary clinic for evaluation of shortness of breath.   Patient's shortness of breath at this time appears to be related to anxiety and social stressors in his life as he reported his breathing improved last night after he took a Xanax.  Given that his shortness of breath started over the last couple of weeks and he had a recent lower extremity vascular surgery and now has swelling of that leg there is potential concern for DVT/PE.  We will check a D-dimer level and if elevated we will then proceed with CTA chest.  He has follow-up with his vascular surgery team on 02/09/2021 and I have asked him to talk with them about performing a lower extremity ultrasound to rule out DVT.  Given his concern for obstructive sleep apnea we will schedule him for home sleep study in the future.  Todd Jackson, MD [facility] Office:  [phone]   Current Outpatient Medications:    aspirin EC 81 MG tablet, Take 1 tablet (81 mg total) by mouth daily., Disp: 150 tablet, Rfl: 2   atorvastatin (LIPITOR) 40 MG tablet, TAKE 1 TABLET BY MOUTH ONCE A DAY., Disp: 30 tablet, Rfl: 0   clopidogrel (PLAVIX) 75 MG tablet, TAKE (1) TABLET BY MOUTH ONCE DAILY., Disp: 90 tablet, Rfl: 0   diclofenac Sodium (VOLTAREN) 1 % GEL, Apply 1 application topically 3 (three) times daily as needed (leg cramps/spasms)., Disp: , Rfl:    famotidine (PEPCID) 20 MG tablet, TAKE 1 TABLET BY MOUTH ONCE DAILY., Disp: 90 tablet, Rfl: 0   FLUoxetine (PROZAC) 20 MG capsule, TAKE (1) CAPSULE BY MOUTH EACH MORNING., Disp: 30 capsule, Rfl: 0   irbesartan (AVAPRO) 300 MG tablet, TAKE 1 TABLET BY MOUTH ONCE A DAY., Disp: 30 tablet, Rfl: 0   Multiple Vitamins-Minerals (MULTIVITAMIN WITH MINERALS) tablet, Take 1 tablet by mouth daily., Disp: ,  Rfl:    oxyCODONE-acetaminophen (PERCOCET) 5-325 MG tablet, Take 1 tablet by mouth every 4 (four) hours as needed for severe pain., Disp: 8 tablet, Rfl: 0   prednisoLONE acetate (PRED FORTE) 1 % ophthalmic suspension, Place 1 drop into the right eye 4 (four) times daily., Disp: 15 mL, Rfl: 0   tamsulosin (FLOMAX) 0.4 MG CAPS capsule, Take 1 capsule (0.4 mg total) by mouth daily. (Patient taking differently: Take 0.4 mg by mouth in the morning.), Disp: 30 capsule, Rfl: 3

## 2021-02-09 ENCOUNTER — Ambulatory Visit: Payer: PPO | Admitting: Vascular Surgery

## 2021-02-09 ENCOUNTER — Encounter: Payer: Self-pay | Admitting: Vascular Surgery

## 2021-02-09 ENCOUNTER — Other Ambulatory Visit (INDEPENDENT_AMBULATORY_CARE_PROVIDER_SITE_OTHER): Payer: PPO

## 2021-02-09 ENCOUNTER — Other Ambulatory Visit: Payer: Self-pay | Admitting: Pulmonary Disease

## 2021-02-09 ENCOUNTER — Ambulatory Visit (HOSPITAL_COMMUNITY)
Admission: RE | Admit: 2021-02-09 | Discharge: 2021-02-09 | Disposition: A | Discharge status: Home / Self Care | Payer: PPO | Source: Ambulatory Visit | Attending: Vascular Surgery | Admitting: Vascular Surgery

## 2021-02-09 VITALS — BP 144/87 | HR 63 | Temp 98.0°F | Resp 20 | Ht 72.0 in | Wt 265.0 lb

## 2021-02-09 DIAGNOSIS — I739 Peripheral vascular disease, unspecified: Secondary | ICD-10-CM | POA: Diagnosis not present

## 2021-02-09 DIAGNOSIS — R0602 Shortness of breath: Secondary | ICD-10-CM

## 2021-02-09 DIAGNOSIS — I8393 Asymptomatic varicose veins of bilateral lower extremities: Secondary | ICD-10-CM

## 2021-02-09 DIAGNOSIS — I70219 Atherosclerosis of native arteries of extremities with intermittent claudication, unspecified extremity: Secondary | ICD-10-CM | POA: Insufficient documentation

## 2021-02-09 LAB — COMPREHENSIVE METABOLIC PANEL
ALT: 24 U/L (ref 0–53)
AST: 17 U/L (ref 0–37)
Albumin: 4.4 g/dL (ref 3.5–5.2)
Alkaline Phosphatase: 71 U/L (ref 39–117)
BUN: 19 mg/dL (ref 6–23)
CO2: 25 mEq/L (ref 19–32)
Calcium: 8.9 mg/dL (ref 8.4–10.5)
Chloride: 104 mEq/L (ref 96–112)
Creatinine, Ser: 0.61 mg/dL (ref 0.40–1.50)
GFR: 100.17 mL/min (ref >60.00)
Glucose, Bld: 99 mg/dL (ref 70–99)
Potassium: 4.4 mEq/L (ref 3.5–5.1)
Sodium: 137 mEq/L (ref 135–145)
Total Bilirubin: 0.6 mg/dL (ref 0.2–1.2)
Total Protein: 7.1 g/dL (ref 6.0–8.3)

## 2021-02-09 NOTE — Progress Notes (Signed)
Patient is a 65 year old male who returns for follow-up today.  He recently underwent right superficial femoral artery stenting on Dec 16, 2020.  He currently has no symptoms in his right leg as far as claudication is concerned.  He does have some ankle and pedal edema in his right leg which worsens as the day progresses.  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  If he develops symptoms consideration would need to be given for a bypass as I do not believe he would be an endovascular candidate in the left leg.  He is not smoking.  He is on Plavix aspirin and a statin.  Past Surgical History:  Procedure Laterality Date   ABDOMINAL AORTOGRAM W/LOWER EXTREMITY Bilateral 02/05/2020   Procedure: ABDOMINAL AORTOGRAM W/LOWER EXTREMITY;  Surgeon: Elam Dutch, MD;  Location: [facility];  Service: Cardiovascular;  Laterality: Bilateral;   ABDOMINAL AORTOGRAM W/LOWER EXTREMITY N/A 12/16/2020   Procedure: ABDOMINAL AORTOGRAM W/LOWER EXTREMITY;  Surgeon: Elam Dutch, MD;  Location: [facility];  Service: Cardiovascular;  Laterality: N/A;   EYE SURGERY Right 05/06/2020   Pneumatic retinopexy for rheg. RD repair - Dr. Bernarda Caffey   EYE SURGERY Right 05/26/2020   PPV/MP - Dr. Bernarda Caffey   GAS INSERTION Right 05/26/2020   Procedure: INSERTION OF GAS;  Surgeon: Bernarda Caffey, MD;  Location: [facility];  Service: Ophthalmology;  Laterality: Right;   MEMBRANE PEEL Right 05/26/2020   Procedure: MEMBRANE PEEL;  Surgeon: Bernarda Caffey, MD;  Location: [facility];  Service: Ophthalmology;  Laterality: Right;   PARS PLANA VITRECTOMY Right 05/26/2020   Procedure: PARS PLANA VITRECTOMY WITH 25 GAUGE;  Surgeon: Bernarda Caffey, MD;  Location: [facility];  Service: Ophthalmology;  Laterality: Right;   PERIPHERAL VASCULAR INTERVENTION Right 12/16/2020   Procedure: PERIPHERAL VASCULAR INTERVENTION;  Surgeon: Elam Dutch, MD;  Location: [facility];  Service: Cardiovascular;  Laterality: Right;  SFA (distal)   PHOTOCOAGULATION Right 05/26/2020   Procedure: PHOTOCOAGULATION;  Surgeon: Bernarda Caffey, MD;  Location: [facility];  Service: Ophthalmology;  Laterality: Right;   PROSTATE BIOPSY  2015   RETINAL DETACHMENT SURGERY Right 05/06/2020   Pneumatic retinopexy for rheg RD repair - Dr. Bernarda Caffey   RETINAL DETACHMENT SURGERY Right 05/26/2020   PPV/MP - Dr. Bernarda Caffey    Current Outpatient Medications on File Prior to Visit  Medication Sig Dispense Refill   aspirin EC 81 MG tablet Take 1 tablet (81 mg total) by mouth daily. 150 tablet 2   atorvastatin (LIPITOR) 40 MG tablet TAKE 1 TABLET BY MOUTH ONCE A DAY. 30 tablet 0   clopidogrel (PLAVIX) 75 MG tablet TAKE (1) TABLET BY MOUTH ONCE DAILY. 90 tablet 0   diclofenac Sodium (VOLTAREN) 1 % GEL Apply 1 application topically 3 (three) times daily as needed (leg cramps/spasms).     famotidine (PEPCID) 20 MG tablet TAKE 1 TABLET BY MOUTH ONCE DAILY. 90 tablet 0   FLUoxetine (PROZAC) 20 MG capsule TAKE (1) CAPSULE BY MOUTH EACH MORNING. 30 capsule 0   irbesartan (AVAPRO) 300 MG tablet TAKE 1 TABLET BY MOUTH ONCE A DAY. 30 tablet 0   Multiple Vitamins-Minerals (MULTIVITAMIN WITH MINERALS) tablet Take 1 tablet by mouth daily.     oxyCODONE-acetaminophen (PERCOCET) 5-325 MG tablet Take 1 tablet by mouth every 4 (four) hours as needed for severe pain. 8 tablet 0   tamsulosin (FLOMAX)  0.4 MG CAPS capsule Take 1 capsule (0.4 mg total) by mouth daily. (Patient taking differently: Take 0.4 mg by mouth in the morning.) 30 capsule 3   prednisoLONE acetate (PRED FORTE) 1 % ophthalmic suspension Place 1 drop into the right eye 4 (four) times daily. (Patient not taking: Reported on 02/09/2021) 15 mL 0   No current facility-administered medications on file prior to visit.     Past Medical History:  Diagnosis Date   Bell's palsy    Cataract    Mixed form OU    Closed nondisplaced fracture of neck of third metacarpal bone of right hand 02/04/2017   Clotting disorder ([place])    Depression    Diverticulitis    GERD (gastroesophageal reflux disease)    Headache(784.0)    HTN (hypertension)    Hyperlipidemia    Hypertensive retinopathy    OU   Mild CAD 2012   PVD (peripheral vascular disease) (HCC)    Sexual dysfunction     Social History   Socioeconomic History   Marital status: Single    Spouse name: Not on file   Number of children: 2   Years of education: 12   Highest education level: Not on file  Occupational History   Occupation: Electrical engineer   Occupation: Unemployed    Comment: disability  Tobacco Use   Smoking status: Former    Packs/day: 1.50    Years: 46.00    Pack years: 69.00    Types: Cigarettes    Quit date: 12/12/2011    Years since quitting: 9.1   Smokeless tobacco: Never  Vaping Use   Vaping Use: Never used  Substance and Sexual Activity   Alcohol use: Yes    Alcohol/week: 4.0 standard drinks    Types: 4 Cans of beer per week    Comment: a couple of beer on a weekend   Drug use: Not Currently    Types: Cocaine, Marijuana, "Crack" cocaine    Comment: over 25 years ago   Sexual activity: Not on file  Other Topics Concern   Not on file  Social History Narrative   Raised in [place], [place]. Does not have any religious beliefs that would effect healthcare. Lives in house with sister. Likes to ride motorcycle for fun.    Moving out on his own; in the next couple of weeks will be moving back home that he had rented      [employer]; with a helmet   Social Determinants of Health   Financial Resource Strain: Low Risk    Difficulty of Paying Living Expenses: Not hard at all  Food Insecurity: No Food Insecurity   Worried About Charity fundraiser in the Last Year: Never true   [place] in the Last Year: Never true  Transportation Needs: No Transportation Needs   Lack of Transportation (Medical): No    Lack of Transportation (Non-Medical): No  Physical Activity: Inactive   Days of Exercise per Week: 0 days   Minutes of Exercise per Session: 0 min  Stress: Stress Concern Present   Feeling of Stress : Rather much  Social Connections: Socially Isolated   Frequency of Communication with Friends and Family: More than three times a week   Frequency of Social Gatherings with Friends and Family: Once a week   Attends Religious Services: Never   Marine scientist or Organizations: No   Attends Archivist Meetings: Never   Marital Status: Never married  Human resources officer  Violence: Not At Risk   Fear of Current or Ex-Partner: No   Emotionally Abused: No   Physically Abused: No   Sexually Abused: No    Physical exam:  Vitals:   02/09/21 0936  BP: (!) 144/87  Pulse: 63  Resp: 20  Temp: 98 F (36.7 C)  SpO2: 97%  Weight: 265 lb (120.2 kg)  Height: 6' (1.829 m)   Extremities: No palpable pedal pulses left foot, 2+ femoral pulses, 1+ right dorsalis pedis pulse.  Trace ankle and pedal edema right foot compared to left, scattered varicosities right lower extremity  Data: Patient had bilateral ABIs performed today which were 0.9 on the right 0.79 on the left  Assessment: Improved ABIs and currently no claudication symptoms bilateral lower extremities.  Mild edema right foot and ankle most likely secondary to reperfusion and his varicose veins.  If this persists or worsens would consider duplex ultrasound of his venous system to rule out DVT.  His symptoms are fairly mild currently.  He is already on aspirin and Plavix.  Plan: Patient will follow up in September of this year with bilateral ABIs and duplex scan of his right SFA stent.  He will return sooner if he develops worsening symptoms.  He will continue his Plavix aspirin and statin for life.  He was requesting today whether or not it would be okay for him to run and walk as much as he wishes and I believe there is no  contraindication to this.  He was given a prescription today for 20 to 30 mm knee-high compression stockings for his right leg.  Ruta Hinds, MD [facility] Office: [phone]

## 2021-02-10 ENCOUNTER — Other Ambulatory Visit: Payer: Self-pay

## 2021-02-10 DIAGNOSIS — I70219 Atherosclerosis of native arteries of extremities with intermittent claudication, unspecified extremity: Secondary | ICD-10-CM

## 2021-02-10 DIAGNOSIS — I739 Peripheral vascular disease, unspecified: Secondary | ICD-10-CM

## 2021-02-10 LAB — D-DIMER, QUANTITATIVE: D-Dimer, Quant: 0.4 mcg/mL FEU (ref <0.50)

## 2021-02-20 ENCOUNTER — Ambulatory Visit (INDEPENDENT_AMBULATORY_CARE_PROVIDER_SITE_OTHER): Payer: PPO | Admitting: Family Medicine

## 2021-02-20 ENCOUNTER — Other Ambulatory Visit: Payer: Self-pay

## 2021-02-20 ENCOUNTER — Encounter: Payer: Self-pay | Admitting: Family Medicine

## 2021-02-20 VITALS — BP 139/82 | HR 63 | Temp 98.0°F | Ht 72.0 in | Wt 264.8 lb

## 2021-02-20 DIAGNOSIS — K219 Gastro-esophageal reflux disease without esophagitis: Secondary | ICD-10-CM | POA: Diagnosis not present

## 2021-02-20 DIAGNOSIS — F324 Major depressive disorder, single episode, in partial remission: Secondary | ICD-10-CM

## 2021-02-20 DIAGNOSIS — I2510 Atherosclerotic heart disease of native coronary artery without angina pectoris: Secondary | ICD-10-CM | POA: Diagnosis not present

## 2021-02-20 DIAGNOSIS — F419 Anxiety disorder, unspecified: Secondary | ICD-10-CM | POA: Diagnosis not present

## 2021-02-20 DIAGNOSIS — F418 Other specified anxiety disorders: Secondary | ICD-10-CM

## 2021-02-20 MED ORDER — FAMOTIDINE 20 MG PO TABS: 20.0000 mg | ORAL_TABLET | Freq: Two times a day (BID) | ORAL | 0 refills | Status: DC

## 2021-02-20 MED ORDER — ALPRAZOLAM 0.25 MG PO TABS: 0.2500 mg | ORAL_TABLET | Freq: Two times a day (BID) | ORAL | 0 refills | Status: DC | PRN

## 2021-02-20 MED ORDER — FLUOXETINE HCL 40 MG PO CAPS: 40.0000 mg | ORAL_CAPSULE | Freq: Every day | ORAL | 3 refills | Status: DC

## 2021-02-20 NOTE — Patient Instructions (Signed)
It was very nice to see you today!  Please increase your Prozac to 40 mg daily.  Also start Xanax.  Please try taking the Pepcid twice daily.  I will see you back in a few weeks.  Please come back to see me sooner if needed.  Take care, Dr Jerline Pain  PLEASE NOTE:  If you had any lab tests please let us know if you have not heard back within a few days. You may see your results on mychart before we have a chance to review them but we will give you a call once they are reviewed by [place]. If we ordered any referrals today, please let us know if you have not heard from their office within the next week.   Please try these tips to maintain a healthy lifestyle:  Eat at least 3 REAL meals and 1-2 snacks per day.  Aim for no more than 5 hours between eating.  If you eat breakfast, please do so within one hour of getting up.   Each meal should contain half fruits/vegetables, one quarter protein, and one quarter carbs (no bigger than a computer mouse)  Cut down on sweet beverages. This includes juice, soda, and sweet tea.   Drink at least 1 glass of water with each meal and aim for at least 8 glasses per day  Exercise at least 150 minutes every week.

## 2021-02-20 NOTE — Progress Notes (Signed)
   Todd Weeks is a 65 y.o. male who presents today for an office visit.  Assessment/Plan:  Chronic Problems Addressed Today: Depression, major, single episode, in partial remission (Todd Weeks) Worsened.  Mostly due to job stress - is working 60 hours/week.  We discussed treatment options.  We will increase his Prozac to 40 mg daily.  Also start low-dose Xanax.  He has done well with this in the past.  He will follow-up with me in a couple of weeks.  Declined referral for therapy for now.  GERD (gastroesophageal reflux disease) Increase Pepcid to 20 mg twice daily.  He will follow-up in a few weeks.  May consider trial of PPI if he continues to have issues.  CAD (coronary artery disease) On Crestor and aspirin.  Following with cardiology.  Anxiety See above.  This is worsened.  Mostly due to stress from work.  Increase Prozac to 40 mg daily.  Start low-dose Xanax as needed as he is doing well with this in the past.  Follow-up in a few weeks.     Subjective:  HPI:  Patient here with increased stress levels.  He also admits to being very stressed due to his work. He admits to having had issues with recovering from a recent leg surgery. The last time he had had issues with breathing was 5 years ago, which he took Xanax for, for 2 months after which he stopped because the issue resolved. Also, he admits to having "moments of anger" during work.   He has also tried taking Valium in the past, and says the side effects are undesirable for his work.  Additionally, he admits to having instances debilitating chest pain, and a mild sensation of tingling under his left arm which he is going to see his cardiologist for. He states that he does not want to see a therapist.  He has been taking famotidine for his reflux but sure if it is fully effective.          Objective:  Physical Exam: BP 139/82   Pulse 63   Temp 98 F (36.7 C) (Temporal)   Ht 6' (1.829 m)   Wt 264 lb 12.8 oz (120.1 kg)   SpO2  97%   BMI 35.91 kg/m   Gen: No acute distress, resting comfortably Neuro: Grossly normal, moves all extremities Psych: Normal affect and thought content      I,Jordan Kelly,acting as a scribe for Dimas Chyle, MD.,have documented all relevant documentation on the behalf of Dimas Chyle, MD,as directed by  Dimas Chyle, MD while in the presence of Dimas Chyle, MD.   I, Dimas Chyle, MD, have reviewed all documentation for this visit. The documentation on 02/20/21 for the exam, diagnosis, procedures, and orders are all accurate and complete.  [name]. Jerline Pain, MD 02/20/2021 10:10 AM

## 2021-02-20 NOTE — Assessment & Plan Note (Signed)
Increase Pepcid to 20 mg twice daily.  He will follow-up in a few weeks.  May consider trial of PPI if he continues to have issues.

## 2021-02-20 NOTE — Assessment & Plan Note (Signed)
See above.  This is worsened.  Mostly due to stress from work.  Increase Prozac to 40 mg daily.  Start low-dose Xanax as needed as he is doing well with this in the past.  Follow-up in a few weeks.

## 2021-02-20 NOTE — Assessment & Plan Note (Signed)
On Crestor and aspirin.  Following with cardiology.

## 2021-02-20 NOTE — Assessment & Plan Note (Signed)
Worsened.  Mostly due to job stress - is working 60 hours/week.  We discussed treatment options.  We will increase his Prozac to 40 mg daily.  Also start low-dose Xanax.  He has done well with this in the past.  He will follow-up with me in a couple of weeks.  Declined referral for therapy for now.

## 2021-03-03 ENCOUNTER — Ambulatory Visit: Payer: PPO

## 2021-03-03 ENCOUNTER — Other Ambulatory Visit: Payer: Self-pay

## 2021-03-03 DIAGNOSIS — R0683 Snoring: Secondary | ICD-10-CM

## 2021-03-03 DIAGNOSIS — G4733 Obstructive sleep apnea (adult) (pediatric): Secondary | ICD-10-CM | POA: Diagnosis not present

## 2021-03-06 ENCOUNTER — Telehealth: Payer: Self-pay | Admitting: Pulmonary Disease

## 2021-03-06 DIAGNOSIS — G4733 Obstructive sleep apnea (adult) (pediatric): Secondary | ICD-10-CM

## 2021-03-06 NOTE — Telephone Encounter (Signed)
Call patient  Sleep study result  Date of study: 03/05/2021  Impression: Severe obstructive sleep apnea Moderately severe oxygen desaturations  Recommendation: DME referral  Recommend CPAP therapy for severe obstructive sleep apnea  Auto titrating CPAP with pressure settings of 5-20 will be appropriate  Encourage weight loss measures  Follow-up in the office 4 to 6 weeks following initiation of treatment   FYI: Dr. Erin Fulling

## 2021-03-06 NOTE — Telephone Encounter (Signed)
Thank you!  Todd Weeks

## 2021-03-08 NOTE — Progress Notes (Signed)
Chief Complaint  Patient presents with   Follow-up    CAD      History of Present Illness: 65 yo male with history of HTN, hyperlipidemia, PAD, tobacco abuse and depression here today for cardiac follow up. I saw him last in 2019. I had followed him for PAD in the past. Cardiac cath in May 2012 with mild disease in the RCA and no disease in the LAD or Circumflex. Lower extremity angiogram May 2012 with 80% proximal superficial femoral artery stenosis. The left superficial femoral artery was occluded at the ostium. A stent was placed in the right SFA in September 2012. He was seen in the [facility] office by Dr. Oneida Alar in 2013. He has undergone stenting of the right SFA in May 2022. Flush occlusion of left SFA with reconstitution at the knee. He was admitted to [facility] September 2019 with dyspnea and chest pain. Troponin negative. Echo 04/26/18 with normal LV systolic function, [id number]. There were no wall motion abnormalities. No significant valve disease. Nuclear stress test 05/12/18 with no ischemia.   He is here today for follow up. The patient denies any dyspnea, palpitations, lower extremity edema, orthopnea, PND, dizziness, near syncope or syncope. He has been having chest pain at rest and sometimes with exertion. No associated dyspnea. 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   Primary Care Physician: Vivi Barrack, MD  Past Medical History:  Diagnosis Date   Bell's palsy    Cataract    Mixed form OU   Closed nondisplaced fracture of neck of third metacarpal bone of right hand 02/04/2017   Clotting disorder ([place])    Depression    Diverticulitis    GERD (gastroesophageal reflux disease)    Headache(784.0)    HTN (hypertension)    Hyperlipidemia    Hypertensive retinopathy    OU   Mild CAD 2012   PVD (peripheral vascular disease) ([place])    Sexual dysfunction     Past Surgical History:  Procedure Laterality Date   ABDOMINAL AORTOGRAM W/LOWER EXTREMITY Bilateral 02/05/2020   Procedure:  ABDOMINAL AORTOGRAM W/LOWER EXTREMITY;  Surgeon: Elam Dutch, MD;  Location: [place] [facility];  Service: Cardiovascular;  Laterality: Bilateral;   ABDOMINAL AORTOGRAM W/LOWER EXTREMITY N/A 12/16/2020   Procedure: ABDOMINAL AORTOGRAM W/LOWER EXTREMITY;  Surgeon: Elam Dutch, MD;  Location: [place] [facility];  Service: Cardiovascular;  Laterality: N/A;   EYE SURGERY Right 05/06/2020   Pneumatic retinopexy for rheg. RD repair - Dr. Bernarda Caffey   EYE SURGERY Right 05/26/2020   PPV/MP - Dr. Bernarda Caffey   GAS INSERTION Right 05/26/2020   Procedure: INSERTION OF GAS;  Surgeon: Bernarda Caffey, MD;  Location: [place];  Service: Ophthalmology;  Laterality: Right;   MEMBRANE PEEL Right 05/26/2020   Procedure: MEMBRANE PEEL;  Surgeon: Bernarda Caffey, MD;  Location: [place];  Service: Ophthalmology;  Laterality: Right;   PARS PLANA VITRECTOMY Right 05/26/2020   Procedure: PARS PLANA VITRECTOMY WITH 25 GAUGE;  Surgeon: Bernarda Caffey, MD;  Location: [place];  Service: Ophthalmology;  Laterality: Right;   PERIPHERAL VASCULAR INTERVENTION Right 12/16/2020   Procedure: PERIPHERAL VASCULAR INTERVENTION;  Surgeon: Elam Dutch, MD;  Location: [place] [facility];  Service: Cardiovascular;  Laterality: Right;  SFA (distal)   PHOTOCOAGULATION Right 05/26/2020   Procedure: PHOTOCOAGULATION;  Surgeon: Bernarda Caffey, MD;  Location: [place];  Service: Ophthalmology;  Laterality: Right;   PROSTATE BIOPSY  2015   RETINAL DETACHMENT SURGERY Right 05/06/2020   Pneumatic retinopexy  for rheg RD repair - Dr. Bernarda Caffey   RETINAL DETACHMENT SURGERY Right 05/26/2020   PPV/MP - Dr. Bernarda Caffey    Current Outpatient Medications  Medication Sig Dispense Refill   ALPRAZolam (XANAX) 0.25 MG tablet Take 1 tablet (0.25 mg total) by mouth 2 (two) times daily as needed for anxiety. 20 tablet 0   aspirin EC 81 MG tablet Take 1 tablet (81 mg total) by mouth daily. 150 tablet 2   atorvastatin (LIPITOR) 40 MG tablet TAKE 1  TABLET BY MOUTH ONCE A DAY. 30 tablet 0   clopidogrel (PLAVIX) 75 MG tablet TAKE (1) TABLET BY MOUTH ONCE DAILY. 90 tablet 0   diclofenac Sodium (VOLTAREN) 1 % GEL Apply 1 application topically 3 (three) times daily as needed (leg cramps/spasms).     famotidine (PEPCID) 20 MG tablet Take 1 tablet (20 mg total) by mouth 2 (two) times daily. TAKE 1 TABLET BY MOUTH ONCE DAILY. 90 tablet 0   FLUoxetine (PROZAC) 40 MG capsule Take 1 capsule (40 mg total) by mouth daily. 30 capsule 3   irbesartan (AVAPRO) 300 MG tablet TAKE 1 TABLET BY MOUTH ONCE A DAY. 30 tablet 0   metoprolol tartrate (LOPRESSOR) 100 MG tablet Take 1 tablet (100 mg total) by mouth once for 1 dose. Take 90-120 minutes prior to scan. 1 tablet 0   Multiple Vitamins-Minerals (MULTIVITAMIN WITH MINERALS) tablet Take 1 tablet by mouth daily.     tamsulosin (FLOMAX) 0.4 MG CAPS capsule Take 1 capsule (0.4 mg total) by mouth daily. (Patient taking differently: Take 0.4 mg by mouth in the morning.) 30 capsule 3   No current facility-administered medications for this visit.    No Known Allergies  Social History   Socioeconomic History   Marital status: Single    Spouse name: Not on file   Number of children: 2   Years of education: 12   Highest education level: Not on file  Occupational History   Occupation: Electrical engineer   Occupation: Unemployed    Comment: disability  Tobacco Use   Smoking status: Former    Packs/day: 1.50    Years: 46.00    Pack years: 69.00    Types: Cigarettes    Quit date: 12/12/2011    Years since quitting: 9.2   Smokeless tobacco: Never  Vaping Use   Vaping Use: Never used  Substance and Sexual Activity   Alcohol use: Yes    Alcohol/week: 4.0 standard drinks    Types: 4 Cans of beer per week    Comment: a couple of beer on a weekend   Drug use: Not Currently    Types: Cocaine, Marijuana, "Crack" cocaine    Comment: over 25 years ago   Sexual activity: Not on file  Other Topics Concern    Not on file  Social History Narrative   Raised in [place], [place]. Does not have any religious beliefs that would effect healthcare. Lives in house with sister. Likes to ride motorcycle for fun.    Moving out on his own; in the next couple of weeks will be moving back home that he had rented      Yoe; with a helmet   Social Determinants of Health   Financial Resource Strain: Low Risk    Difficulty of Paying Living Expenses: Not hard at all  Food Insecurity: No Food Insecurity   Worried About Charity fundraiser in the Last Year: Never true   [place] in the Last  Year: Never true  Transportation Needs: No Transportation Needs   Lack of Transportation (Medical): No   Lack of Transportation (Non-Medical): No  Physical Activity: Inactive   Days of Exercise per Week: 0 days   Minutes of Exercise per Session: 0 min  Stress: Stress Concern Present   Feeling of Stress : Rather much  Social Connections: Socially Isolated   Frequency of Communication with Friends and Family: More than three times a week   Frequency of Social Gatherings with Friends and Family: Once a week   Attends Religious Services: Never   Marine scientist or Organizations: No   Attends Music therapist: Never   Marital Status: Never married  Human resources officer Violence: Not At Risk   Fear of Current or Ex-Partner: No   Emotionally Abused: No   Physically Abused: No   Sexually Abused: No    Family History  Problem Relation Age of Onset   Hypertension Mother    Deep vein thrombosis Father    Diabetes Brother    Tuberculosis Maternal Grandfather    Emphysema Maternal Grandfather    Lung disease Brother    Asthma Son    Rectal cancer Neg Hx    Colon cancer Neg Hx     Review of Systems:  As stated in the HPI and otherwise negative.   BP 128/78   Pulse 74   Ht 6' (1.829 m)   Wt 263 lb 9.6 oz (119.6 kg)   SpO2 96%   BMI 35.75 kg/m   Physical Examination: General: Well  developed, well nourished, NAD  HEENT: OP clear, mucus membranes moist  SKIN: warm, dry. No rashes. Neuro: No focal deficits  Musculoskeletal: Muscle strength 5/5 all ext  Psychiatric: Mood and affect normal  Neck: No JVD, no carotid bruits, no thyromegaly, no lymphadenopathy.  Lungs:Clear bilaterally, no wheezes, rhonci, crackles Cardiovascular: Regular rate and rhythm. No murmurs, gallops or rubs. Abdomen:Soft. Bowel sounds present. Non-tender.  Extremities: No lower extremity edema. Pulses are 2 + in the bilateral DP/PT.  EKG:  EKG is not ordered today. The ekg ordered today demonstrates   Echo 04/26/18: Left ventricle: The cavity size was normal. Systolic function was   normal. The estimated ejection fraction was in the range of 55%   to 60%. Wall motion was normal; there were no regional wall   motion abnormalities. - Aortic valve: Transvalvular velocity was within the normal range.   There was no stenosis. There was no significant regurgitation. - Mitral valve: There was trivial regurgitation. - Left atrium: The atrium was mildly dilated. - Right ventricle: Systolic function was normal. - Right atrium: The atrium was mildly dilated. - Atrial septum: No defect or patent foramen ovale was identified. - Tricuspid valve: There was trivial regurgitation. - Pulmonic valve: There was no significant regurgitation. - Inferior vena cava: The vessel was normal in size. The   respirophasic diameter changes were in the normal range (>= 50%),   consistent with normal central venous pressure.  Recent Labs: 05/26/2020: Platelets 232 12/16/2020: Hemoglobin 13.9 02/09/2021: ALT 24; BUN 19; Creatinine, Ser 0.61; Potassium 4.4; Sodium 137   Lipid Panel    Component Value Date/Time   CHOL 166 03/23/2019 0946   CHOL 137 06/11/2014 0000   TRIG 140.0 03/23/2019 0946   TRIG 173 06/11/2014 0000   HDL 51.50 03/23/2019 0946   CHOLHDL 3 03/23/2019 0946   VLDL 28.0 03/23/2019 0946   VLDL 35  06/11/2014 0000   LDLCALC 86 03/23/2019  0946     Wt Readings from Last 3 Encounters:  03/09/21 263 lb 9.6 oz (119.6 kg)  02/20/21 264 lb 12.8 oz (120.1 kg)  02/09/21 265 lb (120.2 kg)     Other studies Reviewed: Additional studies/ records that were reviewed today include: . Review of the above records demonstrates:    Assessment and Plan:   1. CAD with angina: Mild CAD by cath in 2012. Normal stress test in 2019. LV function is normal by echo in 2019. He has had no exertional chest pain but he is having chest pain at rest and is not sure if this is heart burn. I will continue ASA, Plavix and statin. Will arrange a cardiac CTA to exclude progression of CAD. BMET today  2. PAD: Followed in [facility].   3. Former Tobacco abuse: He stopped smoking in 2012.   4. Hyperlipidemia: Continue statin  5. HTN: BP is controlled. No changes.   Current medicines are reviewed at length with the patient today.  The patient does not have concerns regarding medicines.  The following changes have been made:  no change  Labs/ tests ordered today include:   Orders Placed This Encounter  Procedures   CT CORONARY MORPH W/CTA COR W/SCORE W/CA W/CM &/OR WO/CM   Basic Metabolic Panel (BMET)    Disposition:   FU with me  in 12 months  Signed, [name], MD 03/09/2021 8:53 AM    [facility] [address], [place], [place]  [address] Phone: [phone]; Fax: [phone]

## 2021-03-09 ENCOUNTER — Encounter: Payer: Self-pay | Admitting: Cardiovascular Disease

## 2021-03-09 ENCOUNTER — Other Ambulatory Visit: Payer: Self-pay

## 2021-03-09 ENCOUNTER — Ambulatory Visit: Payer: PPO | Admitting: Cardiovascular Disease

## 2021-03-09 VITALS — BP 128/78 | HR 74 | Ht 72.0 in | Wt 263.6 lb

## 2021-03-09 DIAGNOSIS — I25118 Atherosclerotic heart disease of native coronary artery with other forms of angina pectoris: Secondary | ICD-10-CM | POA: Diagnosis not present

## 2021-03-09 DIAGNOSIS — I739 Peripheral vascular disease, unspecified: Secondary | ICD-10-CM

## 2021-03-09 DIAGNOSIS — R072 Precordial pain: Secondary | ICD-10-CM | POA: Diagnosis not present

## 2021-03-09 DIAGNOSIS — E782 Mixed hyperlipidemia: Secondary | ICD-10-CM

## 2021-03-09 DIAGNOSIS — I10 Essential (primary) hypertension: Secondary | ICD-10-CM

## 2021-03-09 LAB — BASIC METABOLIC PANEL
BUN/Creatinine Ratio: 27 — ABNORMAL HIGH (ref 10–24)
BUN: 17 mg/dL (ref 8–27)
CO2: 22 mmol/L (ref 20–29)
Calcium: 9.3 mg/dL (ref 8.6–10.2)
Chloride: 101 mmol/L (ref 96–106)
Creatinine, Ser: 0.62 mg/dL — ABNORMAL LOW (ref 0.76–1.27)
Glucose: 100 mg/dL — ABNORMAL HIGH (ref 65–99)
Potassium: 4.8 mmol/L (ref 3.5–5.2)
Sodium: 138 mmol/L (ref 134–144)
eGFR: 105 mL/min/{1.73_m2} (ref >59)

## 2021-03-09 MED ORDER — METOPROLOL TARTRATE 100 MG PO TABS: 100.0000 mg | ORAL_TABLET | Freq: Once | ORAL | 0 refills | Status: DC

## 2021-03-09 NOTE — Patient Instructions (Signed)
Medication Instructions:  Your physician recommends that you continue on your current medications as directed. Please refer to the Current Medication list given to you today.  *If you need a refill on your cardiac medications before your next appointment, please call your pharmacy*   Lab Work: Your physician recommends that you have lab work today- BMET  If you have labs (blood work) drawn today and your tests are completely normal, you will receive your results only by: MyChart Message (if you have MyChart) OR A paper copy in the mail If you have any lab test that is abnormal or we need to change your treatment, we will call you to review the results.   Testing/Procedures: Your physician has requested that you have cardiac CT. Cardiac computed tomography (CT) is a painless test that uses an x-ray machine to take clear, detailed pictures of your heart. For further information please visit [email or web address].[email or web address]. Please follow instruction sheet as given.  Follow-Up: At [facility], you and your health needs are our priority.  As part of our continuing mission to provide you with exceptional heart care, we have created designated Provider Care Teams.  These Care Teams include your primary Cardiologist (physician) and Advanced Practice Providers (APPs -  Physician Assistants and Nurse Practitioners) who all work together to provide you with the care you need, when you need it.  We recommend signing up for the patient portal called "MyChart".  Sign up information is provided on this After Visit Summary.  MyChart is used to connect with patients for Virtual Visits (Telemedicine).  Patients are able to view lab/test results, encounter notes, upcoming appointments, etc.  Non-urgent messages can be sent to your provider as well.   To learn more about what you can do with MyChart, go to [email or web address].    Your next appointment:   12 month(s)  The format for your next appointment:   In  Person  Provider:   You may see [name], MD or one of the following Advanced Practice Providers on your designated Care Team:   Melina Copa, PA-C Ermalinda Barrios, PA-C   Other Instructions   Your cardiac CT will be scheduled at one of the below locations:   [facility] [address] [place], [place] [address] [phone]  If scheduled at [facility], please arrive at the [facility] main entrance (entrance A) of [facility] 30 minutes prior to test start time. Proceed to the [facility] Radiology Department (first floor) to check-in and test prep.  Please follow these instructions carefully (unless otherwise directed):  Hold all erectile dysfunction medications at least 3 days (72 hrs) prior to test.  On the Night Before the Test: Be sure to Drink plenty of water. Do not consume any caffeinated/decaffeinated beverages or chocolate 12 hours prior to your test. Do not take any antihistamines 12 hours prior to your test.  On the Day of the Test: Drink plenty of water until 1 hour prior to the test. Do not eat any food 4 hours prior to the test. You may take your regular medications prior to the test.  Take metoprolol (Lopressor) 100 mg two hours prior to test.      After the Test: Drink plenty of water. After receiving IV contrast, you may experience a mild flushed feeling. This is normal. On occasion, you may experience a mild rash up to 24 hours after the test. This is not dangerous. If this occurs, you can take Benadryl 25 mg  and increase your fluid intake. If you experience trouble breathing, this can be serious. If it is severe call 911 IMMEDIATELY. If it is mild, please call our office.  Please allow 2-4 weeks for scheduling of routine cardiac CTs. Some insurance companies require a pre-authorization which may delay scheduling of this test.   For non-scheduling related questions, please contact the cardiac imaging nurse navigator  should you have any questions/concerns: Marchia Bond, Cardiac Imaging Nurse Navigator Gordy Clement, Cardiac Imaging Nurse Navigator [facility] Direct Office Dial: [phone]   For scheduling needs, including cancellations and rescheduling, please call [place], [phone].

## 2021-03-10 ENCOUNTER — Other Ambulatory Visit: Payer: Self-pay | Admitting: Cardiovascular Disease

## 2021-03-13 NOTE — Telephone Encounter (Signed)
I called and spoke with patient regarding HST results and Dr. Roxanne Mins. Patient verbalized understanding and I sent in a order for new CPAP start. I informed patient that the DME company will be in contact next and about delay on machines. I also instructed patient to call when received machine and set up 4-6 week f/u Patient verbalized understanding, nothing further needed.

## 2021-03-13 NOTE — Addendum Note (Signed)
Addended byCoralie Keens on: 03/13/2021 02:52 PM   Modules accepted: Orders

## 2021-03-17 ENCOUNTER — Telehealth (HOSPITAL_COMMUNITY): Payer: Self-pay | Admitting: Emergency Medicine

## 2021-03-17 NOTE — Telephone Encounter (Signed)
Reaching out to patient to offer assistance regarding upcoming cardiac imaging study; pt verbalizes understanding of appt date/time, parking situation and where to check in, pre-test NPO status and medications ordered, and verified current allergies; name and call back number provided for further questions should they arise Marchia Bond RN Navigator Cardiac Imaging [facility] [phone] office [phone] cell    Denies iv issues Some claustro (sts does not need to take prn xanax) '100mg'$  metoprolol 2 h prior to scan

## 2021-03-20 ENCOUNTER — Ambulatory Visit (HOSPITAL_COMMUNITY)
Admission: RE | Admit: 2021-03-20 | Discharge: 2021-03-20 | Disposition: A | Discharge status: Home / Self Care | Payer: PPO | Source: Ambulatory Visit | Attending: Cardiovascular Disease | Admitting: Cardiovascular Disease

## 2021-03-20 ENCOUNTER — Other Ambulatory Visit: Payer: Self-pay | Admitting: Family Medicine

## 2021-03-20 ENCOUNTER — Other Ambulatory Visit: Payer: Self-pay | Admitting: Cardiovascular Disease

## 2021-03-20 ENCOUNTER — Other Ambulatory Visit: Payer: Self-pay

## 2021-03-20 DIAGNOSIS — R931 Abnormal findings on diagnostic imaging of heart and coronary circulation: Secondary | ICD-10-CM | POA: Diagnosis not present

## 2021-03-20 DIAGNOSIS — R072 Precordial pain: Secondary | ICD-10-CM | POA: Insufficient documentation

## 2021-03-20 DIAGNOSIS — I25118 Atherosclerotic heart disease of native coronary artery with other forms of angina pectoris: Secondary | ICD-10-CM | POA: Diagnosis not present

## 2021-03-20 DIAGNOSIS — I10 Essential (primary) hypertension: Secondary | ICD-10-CM | POA: Diagnosis not present

## 2021-03-20 DIAGNOSIS — I739 Peripheral vascular disease, unspecified: Secondary | ICD-10-CM | POA: Insufficient documentation

## 2021-03-20 DIAGNOSIS — R079 Chest pain, unspecified: Secondary | ICD-10-CM | POA: Diagnosis not present

## 2021-03-20 DIAGNOSIS — I2510 Atherosclerotic heart disease of native coronary artery without angina pectoris: Secondary | ICD-10-CM | POA: Diagnosis not present

## 2021-03-20 DIAGNOSIS — E782 Mixed hyperlipidemia: Secondary | ICD-10-CM | POA: Insufficient documentation

## 2021-03-20 MED ORDER — NITROGLYCERIN 0.4 MG SL SUBL
SUBLINGUAL_TABLET | SUBLINGUAL | Status: AC
  Filled 2021-03-20: qty 2

## 2021-03-20 MED ORDER — IOHEXOL 350 MG/ML SOLN
100.0000 mL | Freq: Once | INTRAVENOUS | Status: AC | PRN
  Administered 2021-03-20: 100 mL via INTRAVENOUS

## 2021-03-20 MED ORDER — NITROGLYCERIN 0.4 MG SL SUBL
0.8000 mg | SUBLINGUAL_TABLET | Freq: Once | SUBLINGUAL | Status: AC
  Administered 2021-03-20: 0.8 mg via SUBLINGUAL

## 2021-03-20 NOTE — Progress Notes (Signed)
CT FFR ordered.   [place] Bells T. Audie Box, MD, [place]  [address], [place], [place] [address] [phone]  1:18 PM

## 2021-03-21 DIAGNOSIS — I2510 Atherosclerotic heart disease of native coronary artery without angina pectoris: Secondary | ICD-10-CM | POA: Diagnosis not present

## 2021-03-23 ENCOUNTER — Ambulatory Visit: Payer: PPO | Admitting: Physician Assistant

## 2021-03-23 ENCOUNTER — Telehealth: Payer: Self-pay | Admitting: *Deleted

## 2021-03-23 ENCOUNTER — Other Ambulatory Visit: Payer: Self-pay

## 2021-03-23 ENCOUNTER — Encounter: Payer: Self-pay | Admitting: Physician Assistant

## 2021-03-23 VITALS — BP 140/80 | HR 81 | Resp 20 | Ht 72.0 in | Wt 255.2 lb

## 2021-03-23 DIAGNOSIS — Z79899 Other long term (current) drug therapy: Secondary | ICD-10-CM

## 2021-03-23 DIAGNOSIS — R9389 Abnormal findings on diagnostic imaging of other specified body structures: Secondary | ICD-10-CM

## 2021-03-23 DIAGNOSIS — E785 Hyperlipidemia, unspecified: Secondary | ICD-10-CM

## 2021-03-23 DIAGNOSIS — I10 Essential (primary) hypertension: Secondary | ICD-10-CM

## 2021-03-23 MED ORDER — NITROGLYCERIN 0.4 MG SL SUBL: 0.4000 mg | SUBLINGUAL_TABLET | SUBLINGUAL | 3 refills | Status: DC | PRN

## 2021-03-23 NOTE — Telephone Encounter (Signed)
-----   Message from Burnell Blanks, MD sent at 03/21/2021  3:35 PM EDT ----- CT FFR shows that the flow in his RCA may be ok but it is hard to know for certain with the heavy calcification. He will need to be seen back to discuss cath. Is there any way we can get him in to see a PA over the next week? Gerald Stabs  ----- Message ----- From: Geralynn Rile, MD Sent: 03/21/2021  12:59 PM EDT To: Burnell Blanks, MD  Gerald Stabs:  The RCA is heavily calcified and the prox RCA is cleared by CT FFR. His distal RCA is positive, but the CT FFR tapers as you go down the vessel. I suspect this is just trans-lesional tapering. There are new criteria for Ct FFR on across multiple lesions. People are using change by 0.13 or more as positive. He is negative between prox to mid (change 0.07) and mid to distal (change 0.09) using this.  However, given his high calcium score that is mainly in the RCA, I would consider a left heart cath to sort it out. It may be something you end up treating medically but I have my doubts about CT FFR with high calcium. HeartFlow says reliable, but I have seen it be falsely negative.   -Wes  ----- Message ----- From: Interface, Rad Results In Sent: 03/21/2021  12:53 PM EDT To: Geralynn Rile, MD

## 2021-03-23 NOTE — Patient Instructions (Addendum)
Medication Instructions:  HOLD- Irbesartan on August 24th day of your CATH  *If you need a refill on your cardiac medications before your next appointment, please call your pharmacy*   Lab Work: CBC and BMP Today  If you have labs (blood work) drawn today and your tests are completely normal, you will receive your results only by: [place] (if you have MyChart) OR A paper copy in the mail If you have any lab test that is abnormal or we need to change your treatment, we will call you to review the results.   Testing/Procedures: Your physician has requested that you have a left heart cardiac catheterization. Cardiac catheterization is used to diagnose and/or treat various heart conditions. Doctors may recommend this procedure for a number of different reasons. The most common reason is to evaluate chest pain. Chest pain can be a symptom of coronary artery disease (CAD), and cardiac catheterization can show whether plaque is narrowing or blocking your heart's arteries. This procedure is also used to evaluate the valves, as well as measure the blood flow and oxygen levels in different parts of your heart. For further information please visit [email or web address]. Please follow instruction sheet, as given.   Follow-Up: At [facility], you and your health needs are our priority.  As part of our continuing mission to provide you with exceptional heart care, we have created designated Provider Care Teams.  These Care Teams include your primary Cardiologist (physician) and Advanced Practice Providers (APPs -  Physician Assistants and Nurse Practitioners) who all work together to provide you with the care you need, when you need it.  We recommend signing up for the patient portal called "MyChart".  Sign up information is provided on this After Visit Summary.  MyChart is used to connect with patients for Virtual Visits (Telemedicine).  Patients are able to view lab/test results, encounter notes,  upcoming appointments, etc.  Non-urgent messages can be sent to your provider as well.   To learn more about what you can do with MyChart, go to [email or web address].    Your next appointment:   1 month(s)  The format for your next appointment:   In Person  Provider:   [name], MD   Other Instructions  [place] [address] [place] [address] Dept: [phone] Loc: [place]  03/23/2021  You are scheduled for a Cardiac Catheterization on Wednesday, August 24 with Dr. [name].  1. Please arrive at the [facility] (Main Entrance A) at [facility]: [address] [place], [place] [address] at 6:30 AM (This time is two hours before your procedure to ensure your preparation). Free valet parking service is available.   Special note: Every effort is made to have your procedure done on time. Please understand that emergencies sometimes delay scheduled procedures.  2. Diet: Do not eat solid foods after midnight.  The patient may have clear liquids until 5am upon the day of the procedure.  3. Labs: You will need to have blood drawn on Thursday, August 11 at [place]  Open: 8am - 5pm (Lunch 12:30 - 1:30)   Phone: [phone]. You do not need to be fasting.  4. Medication instructions in preparation for your procedure:   Contrast Allergy: No  Stop taking, Avapro (Irbesartan) Wednesday, August 24,  On the morning of your procedure, take your Aspirin and Plavix/Clopidogrel and any morning medicines NOT  listed above.  You may use sips of water.  5. Plan for one night stay--bring personal belongings. 6. Bring a current list of your medications and current insurance cards. 7. You MUST have a responsible person to drive you home. 8. Someone MUST be with you the first 24 hours after you arrive home or  your discharge will be delayed. 9. Please wear clothes that are easy to get on and off and wear slip-on shoes.  Thank you for allowing [place] to care for you!   -- [place] Invasive Cardiovascular services

## 2021-03-23 NOTE — Progress Notes (Signed)
Cardiology Office Note:    Date:  03/25/2021   ID:  Todd Weeks, DOB 08-11-1956, MRN [id number]  PCP:  Vivi Barrack, MD   [facility] Providers Cardiologist:  [name], MD     Referring MD: Vivi Barrack, MD   Chief Complaint  Patient presents with   Follow-up    Abnormal coronary CT    History of Present Illness:    Todd Weeks is a 65 y.o. male with a hx of hypertension, hyperlipidemia, PAD, tobacco abuse and depression.  Cardiac catheterization in May 2012 showed mild disease in RCA, no disease in the LAD or left circumflex artery.  Lower extremity angiography in May 2012 showed 80% proximal right SFA stenosis treated with a stent, left SFA was occluded at the ostium.  He underwent repeat stenting of right SFA in May 2022.  He was admitted to [facility] in September 2019 with dyspnea and chest pain.  Echocardiogram at the time showed normal EF 55 to 60%, no wall motion abnormality.  Myoview in September 2019 was normal.  He was most recently seen back by Dr. Angelena Form on 03/01/2021 at which time he described chest pain at rest.  A repeat coronary CT was obtained 03/20/2021 that revealed minimal less than 25% proximal LAD lesion, minimal less than 25% D1 lesion, minimal less than 25% OM1 lesion, moderate to severe 60 to 80% stenosis in proximal RCA, 40- 60% lesion in the mid RCA.  Subsequent FFR showed no significant disease in LAD and left circumflex, low likelihood of significant disease in proximal RCA, mid to distal RCA lesion may be significant however could be due to tapering.  Will consider left heart cath for clarification given heavy calcification noted in the RCA.  Past Medical History:  Diagnosis Date   Bell's palsy    Cataract    Mixed form OU   Closed nondisplaced fracture of neck of third metacarpal bone of right hand 02/04/2017   Clotting disorder ([place])    Depression    Diverticulitis    GERD (gastroesophageal reflux disease)     Headache(784.0)    HTN (hypertension)    Hyperlipidemia    Hypertensive retinopathy    OU   Mild CAD 2012   PVD (peripheral vascular disease) (HCC)    Sexual dysfunction     Past Surgical History:  Procedure Laterality Date   ABDOMINAL AORTOGRAM W/LOWER EXTREMITY Bilateral 02/05/2020   Procedure: ABDOMINAL AORTOGRAM W/LOWER EXTREMITY;  Surgeon: Elam Dutch, MD;  Location: [place] [facility];  Service: Cardiovascular;  Laterality: Bilateral;   ABDOMINAL AORTOGRAM W/LOWER EXTREMITY N/A 12/16/2020   Procedure: ABDOMINAL AORTOGRAM W/LOWER EXTREMITY;  Surgeon: Elam Dutch, MD;  Location: [place] [facility];  Service: Cardiovascular;  Laterality: N/A;   EYE SURGERY Right 05/06/2020   Pneumatic retinopexy for rheg. RD repair - Dr. Bernarda Caffey   EYE SURGERY Right 05/26/2020   PPV/MP - Dr. Bernarda Caffey   GAS INSERTION Right 05/26/2020   Procedure: INSERTION OF GAS;  Surgeon: Bernarda Caffey, MD;  Location: [place];  Service: Ophthalmology;  Laterality: Right;   MEMBRANE PEEL Right 05/26/2020   Procedure: MEMBRANE PEEL;  Surgeon: Bernarda Caffey, MD;  Location: [place];  Service: Ophthalmology;  Laterality: Right;   PARS PLANA VITRECTOMY Right 05/26/2020   Procedure: PARS PLANA VITRECTOMY WITH 25 GAUGE;  Surgeon: Bernarda Caffey, MD;  Location: [place];  Service: Ophthalmology;  Laterality: Right;   PERIPHERAL VASCULAR INTERVENTION Right 12/16/2020   Procedure:  PERIPHERAL VASCULAR INTERVENTION;  Surgeon: Elam Dutch, MD;  Location: [place] [facility];  Service: Cardiovascular;  Laterality: Right;  SFA (distal)   PHOTOCOAGULATION Right 05/26/2020   Procedure: PHOTOCOAGULATION;  Surgeon: Bernarda Caffey, MD;  Location: [place];  Service: Ophthalmology;  Laterality: Right;   PROSTATE BIOPSY  2015   RETINAL DETACHMENT SURGERY Right 05/06/2020   Pneumatic retinopexy for rheg RD repair - Dr. Bernarda Caffey   RETINAL DETACHMENT SURGERY Right 05/26/2020   PPV/MP - Dr. Bernarda Caffey    Current  Medications: Current Meds  Medication Sig   ALPRAZolam (XANAX) 0.25 MG tablet Take 1 tablet (0.25 mg total) by mouth 2 (two) times daily as needed for anxiety.   aspirin EC 81 MG tablet Take 1 tablet (81 mg total) by mouth daily.   atorvastatin (LIPITOR) 40 MG tablet TAKE 1 TABLET BY MOUTH ONCE A DAY.   clopidogrel (PLAVIX) 75 MG tablet TAKE (1) TABLET BY MOUTH ONCE DAILY.   diclofenac Sodium (VOLTAREN) 1 % GEL Apply 1 application topically 3 (three) times daily as needed (leg cramps/spasms).   famotidine (PEPCID) 20 MG tablet Take 1 tablet (20 mg total) by mouth 2 (two) times daily. TAKE 1 TABLET BY MOUTH ONCE DAILY.   FLUoxetine (PROZAC) 40 MG capsule Take 1 capsule (40 mg total) by mouth daily.   irbesartan (AVAPRO) 300 MG tablet TAKE 1 TABLET BY MOUTH ONCE A DAY.   nitroGLYCERIN (NITROSTAT) 0.4 MG SL tablet Place 1 tablet (0.4 mg total) under the tongue every 5 (five) minutes as needed for chest pain.   tamsulosin (FLOMAX) 0.4 MG CAPS capsule Take 1 capsule (0.4 mg total) by mouth daily. (Patient taking differently: Take 0.4 mg by mouth in the morning.)     Allergies:   Patient has no known allergies.   Social History   Socioeconomic History   Marital status: Single    Spouse name: Not on file   Number of children: 2   Years of education: 12   Highest education level: Not on file  Occupational History   Occupation: Electrical engineer   Occupation: Unemployed    Comment: disability  Tobacco Use   Smoking status: Former    Packs/day: 1.50    Years: 46.00    Pack years: 69.00    Types: Cigarettes    Quit date: 12/12/2011    Years since quitting: 9.2   Smokeless tobacco: Never  Vaping Use   Vaping Use: Never used  Substance and Sexual Activity   Alcohol use: Yes    Alcohol/week: 4.0 standard drinks    Types: 4 Cans of beer per week    Comment: a couple of beer on a weekend   Drug use: Not Currently    Types: Cocaine, Marijuana, "Crack" cocaine    Comment: over 25  years ago   Sexual activity: Not on file  Other Topics Concern   Not on file  Social History Narrative   Raised in [place], [place]. Does not have any religious beliefs that would effect healthcare. Lives in house with sister. Likes to ride motorcycle for fun.    Moving out on his own; in the next couple of weeks will be moving back home that he had rented      Streetman; with a helmet   Social Determinants of Health   Financial Resource Strain: Low Risk    Difficulty of Paying Living Expenses: Not hard at all  Food Insecurity: No Food Insecurity   Worried About Running Out  of Food in the Last Year: Never true   [place] in the Last Year: Never true  Transportation Needs: No Transportation Needs   Lack of Transportation (Medical): No   Lack of Transportation (Non-Medical): No  Physical Activity: Inactive   Days of Exercise per Week: 0 days   Minutes of Exercise per Session: 0 min  Stress: Stress Concern Present   Feeling of Stress : Rather much  Social Connections: Socially Isolated   Frequency of Communication with Friends and Family: More than three times a week   Frequency of Social Gatherings with Friends and Family: Once a week   Attends Religious Services: Never   Marine scientist or Organizations: No   Attends Music therapist: Never   Marital Status: Never married     Family History: The patient's family history includes Asthma in his son; Deep vein thrombosis in his father; Diabetes in his brother; Emphysema in his maternal grandfather; Hypertension in his mother; Lung disease in his brother; Tuberculosis in his maternal grandfather. There is no history of Rectal cancer or Colon cancer.  ROS:   Please see the history of present illness.     All other systems reviewed and are negative.  EKGs/Labs/Other Studies Reviewed:    The following studies were reviewed today:  Coronary CT 03/20/2021 IMPRESSION: 1. Coronary calcium score of 1178. This was  92nd percentile for age-, sex, and race-matched controls.   2. Normal coronary origin with right dominance.   3. Minimal CAD (<25%) in the LAD/LCX.   4. Moderate to severe stenosis in the proximal RCA (60-80%). The mid and distal RCA segments are diffusely diseased with heavy calcified plaque that is mild to moderate (40-60%).   5. Mid LAD myocardial bridge (normal variant).   RECOMMENDATIONS: 1. Moderate to severe stenosis in the proximal RCA. Consider symptom-guided anti-ischemic pharmacotherapy as well as risk factor modification per guideline directed care. Additional analysis with CT FFR will be submitted.  FINDINGS: 1. Left Main: 0.99; Low likelihood of hemodynamic significance.   2. Proximal LAD: 0.95; Low likelihood of hemodynamic significance. 3. Mid LAD: 0.89; Low likelihood of hemodynamic significance. 4. Distal LAD: 0.79; Borderline likelihood of hemodynamic significance(likely tapering artifact in distal vessel). 5. LCX: 0.95; Low likelihood of hemodynamic significance. 6. Proximal RCA: 0.84; Low likelihood of hemodynamic significance. 7. Mid RCA: 0.77; Borderline likelihood of hemodynamic significance. 8. Distal RCA 0.68; High likelihood of hemodynamic significance   IMPRESSION:   1.  No significant CAD in the LAD/LCX by CT FFR.   2.  Proximal RCA with low likelihood of significance by CT FFR.   3. Mid and Distal segments of the RCA may be significant, but there is tapering of CT FFR due to multiple lesions. Would consider left heart catheterization for clarification given heavy calcifications noted in the RCA on the original CCTA.  EKG:  EKG is not ordered today.   Recent Labs: 02/09/2021: ALT 24 03/23/2021: BUN 17; Creatinine, Ser 0.68; Hemoglobin 14.3; Platelets 234; Potassium 4.1; Sodium 138  Recent Lipid Panel    Component Value Date/Time   CHOL 166 03/23/2019 0946   CHOL 137 06/11/2014 0000   TRIG 140.0 03/23/2019 0946   TRIG 173 06/11/2014 0000    HDL 51.50 03/23/2019 0946   CHOLHDL 3 03/23/2019 0946   VLDL 28.0 03/23/2019 0946   VLDL 35 06/11/2014 0000   LDLCALC 86 03/23/2019 0946     Risk Assessment/Calculations:  Physical Exam:    VS:  BP 140/80   Pulse 81   Resp 20   Ht 6' (1.829 m)   Wt 255 lb 3.2 oz (115.8 kg)   SpO2 94%   BMI 34.61 kg/m     Wt Readings from Last 3 Encounters:  03/23/21 255 lb 3.2 oz (115.8 kg)  03/09/21 263 lb 9.6 oz (119.6 kg)  02/20/21 264 lb 12.8 oz (120.1 kg)     GEN:  Well nourished, well developed in no acute distress HEENT: Normal NECK: No JVD; No carotid bruits LYMPHATICS: No lymphadenopathy CARDIAC: RRR, no murmurs, rubs, gallops RESPIRATORY:  Clear to auscultation without rales, wheezing or rhonchi  ABDOMEN: Soft, non-tender, non-distended MUSCULOSKELETAL:  No edema; No deformity  SKIN: Warm and dry NEUROLOGIC:  Alert and oriented x 3 PSYCHIATRIC:  Normal affect   ASSESSMENT:    1. Abnormal computed tomography angiography (CTA)   2. Medication management   3. Primary hypertension   4. Hyperlipidemia LDL goal <70    PLAN:    In order of problems listed above:  Abnormal coronary CT: Reviewed by Dr. Angelena Form who recommended proceeding with cardiac catheterization.  Prescribed sublingual nitroglycerin.  He has not had any further chest pain since the last office visit.  However coronary CT suggested RCA disease that would need to be further investigated by cardiac catheterization. -Risk and benefit of procedure explained to the patient who display clear understanding and agree to proceed.  Discussed with patient possible procedural risk include bleeding, vascular injury, renal injury, arrythmia, MI, stroke and loss of limb or life.   Hypertension: Blood pressure stable  Hyperlipidemia: Continue Lipitor 40 mg daily   Shared Decision Making/Informed Consent The risks [stroke (1 in 1000), death (1 in 1000), kidney failure [usually temporary] (1 in 500),  bleeding (1 in 200), allergic reaction [possibly serious] (1 in 200)], benefits (diagnostic support and management of coronary artery disease) and alternatives of a cardiac catheterization were discussed in detail with Mr. Slisz and he is willing to proceed.    Medication Adjustments/Labs and Tests Ordered: Current medicines are reviewed at length with the patient today.  Concerns regarding medicines are outlined above.  Orders Placed This Encounter  Procedures   CBC   Basic Metabolic Panel (BMET)   Meds ordered this encounter  Medications   nitroGLYCERIN (NITROSTAT) 0.4 MG SL tablet    Sig: Place 1 tablet (0.4 mg total) under the tongue every 5 (five) minutes as needed for chest pain.    Dispense:  25 tablet    Refill:  3    Patient Instructions  Medication Instructions:  HOLD- Irbesartan on August 24th day of your CATH  *If you need a refill on your cardiac medications before your next appointment, please call your pharmacy*   Lab Work: CBC and BMP Today  If you have labs (blood work) drawn today and your tests are completely normal, you will receive your results only by: [facility] (if you have MyChart) OR A paper copy in the mail If you have any lab test that is abnormal or we need to change your treatment, we will call you to review the results.   Testing/Procedures: Your physician has requested that you have a left heart cardiac catheterization. Cardiac catheterization is used to diagnose and/or treat various heart conditions. Doctors may recommend this procedure for a number of different reasons. The most common reason is to evaluate chest pain. Chest pain can be a symptom of coronary artery disease (  CAD), and cardiac catheterization can show whether plaque is narrowing or blocking your heart's arteries. This procedure is also used to evaluate the valves, as well as measure the blood flow and oxygen levels in different parts of your heart. For further information please  visit [email or web address]. Please follow instruction sheet, as given.   Follow-Up: At [facility], you and your health needs are our priority.  As part of our continuing mission to provide you with exceptional heart care, we have created designated Provider Care Teams.  These Care Teams include your primary Cardiologist (physician) and Advanced Practice Providers (APPs -  Physician Assistants and Nurse Practitioners) who all work together to provide you with the care you need, when you need it.  We recommend signing up for the patient portal called "MyChart".  Sign up information is provided on this After Visit Summary.  MyChart is used to connect with patients for Virtual Visits (Telemedicine).  Patients are able to view lab/test results, encounter notes, upcoming appointments, etc.  Non-urgent messages can be sent to your provider as well.   To learn more about what you can do with MyChart, go to [email or web address].    Your next appointment:   1 month(s)  The format for your next appointment:   In Person  Provider:   [name], MD   Other Instructions  [place] [address] [place] [address] Dept: [phone] Loc: [place]  03/23/2021  You are scheduled for a Cardiac Catheterization on Wednesday, August 24 with Dr. [name].  1. Please arrive at the [facility] (Main Entrance A) at [facility]: [address] [place], [place] [address] at 6:30 AM (This time is two hours before your procedure to ensure your preparation). Free valet parking service is available.   Special note: Every effort is made to have your procedure done on time. Please understand that emergencies sometimes delay scheduled procedures.  2. Diet: Do not eat solid foods after midnight.  The patient may have clear liquids until 5am upon the day of the  procedure.  3. Labs: You will need to have blood drawn on Thursday, August 11 at [place]  Open: 8am - 5pm (Lunch 12:30 - 1:30)   Phone: [phone]. You do not need to be fasting.  4. Medication instructions in preparation for your procedure:   Contrast Allergy: No  Stop taking, Avapro (Irbesartan) Wednesday, August 24,  On the morning of your procedure, take your Aspirin and Plavix/Clopidogrel and any morning medicines NOT listed above.  You may use sips of water.  5. Plan for one night stay--bring personal belongings. 6. Bring a current list of your medications and current insurance cards. 7. You MUST have a responsible person to drive you home. 8. Someone MUST be with you the first 24 hours after you arrive home or your discharge will be delayed. 9. Please wear clothes that are easy to get on and off and wear slip-on shoes.  Thank you for allowing [place] to care for you!   -- [facility] Invasive Cardiovascular services    Signed, Almyra Deforest, [place]  03/25/2021 11:57 PM    [place]

## 2021-03-23 NOTE — Telephone Encounter (Signed)
Left message for patient to call back.  Appointment on hold for this afternoon with Almyra Deforest PA-C.

## 2021-03-23 NOTE — H&P (View-Only) (Signed)
Cardiology Office Note:    Date:  03/25/2021   ID:  Todd Weeks, DOB [birth year], MRN [id number]  PCP:  Vivi Barrack, MD   [facility] Providers Cardiologist:  [name], MD     Referring MD: Vivi Barrack, MD   Chief Complaint  Patient presents with   Follow-up    Abnormal coronary CT    History of Present Illness:    Todd Weeks is a 65 y.o. male with a hx of hypertension, hyperlipidemia, PAD, tobacco abuse and depression.  Cardiac catheterization in May 2012 showed mild disease in RCA, no disease in the LAD or left circumflex artery.  Lower extremity angiography in May 2012 showed 80% proximal right SFA stenosis treated with a stent, left SFA was occluded at the ostium.  He underwent repeat stenting of right SFA in May 2022.  He was admitted to [facility] in September 2019 with dyspnea and chest pain.  Echocardiogram at the time showed normal EF 55 to 60%, no wall motion abnormality.  Myoview in September 2019 was normal.  He was most recently seen back by Dr. Angelena Form on 03/01/2021 at which time he described chest pain at rest.  A repeat coronary CT was obtained 03/20/2021 that revealed minimal less than 25% proximal LAD lesion, minimal less than 25% D1 lesion, minimal less than 25% OM1 lesion, moderate to severe 60 to 80% stenosis in proximal RCA, 40- 60% lesion in the mid RCA.  Subsequent FFR showed no significant disease in LAD and left circumflex, low likelihood of significant disease in proximal RCA, mid to distal RCA lesion may be significant however could be due to tapering.  Will consider left heart cath for clarification given heavy calcification noted in the RCA.  Past Medical History:  Diagnosis Date   Bell's palsy    Cataract    Mixed form OU   Closed nondisplaced fracture of neck of third metacarpal bone of right hand 02/04/2017   Clotting disorder ([place])    Depression    Diverticulitis    GERD (gastroesophageal reflux disease)     Headache(784.0)    HTN (hypertension)    Hyperlipidemia    Hypertensive retinopathy    OU   Mild CAD 2012   PVD (peripheral vascular disease) (HCC)    Sexual dysfunction     Past Surgical History:  Procedure Laterality Date   ABDOMINAL AORTOGRAM W/LOWER EXTREMITY Bilateral 02/05/2020   Procedure: ABDOMINAL AORTOGRAM W/LOWER EXTREMITY;  Surgeon: Elam Dutch, MD;  Location: [place] [facility];  Service: Cardiovascular;  Laterality: Bilateral;   ABDOMINAL AORTOGRAM W/LOWER EXTREMITY N/A 12/16/2020   Procedure: ABDOMINAL AORTOGRAM W/LOWER EXTREMITY;  Surgeon: Elam Dutch, MD;  Location: [place] [facility];  Service: Cardiovascular;  Laterality: N/A;   EYE SURGERY Right 05/06/2020   Pneumatic retinopexy for rheg. RD repair - Dr. Bernarda Caffey   EYE SURGERY Right 05/26/2020   PPV/MP - Dr. Bernarda Caffey   GAS INSERTION Right 05/26/2020   Procedure: INSERTION OF GAS;  Surgeon: Bernarda Caffey, MD;  Location: [place];  Service: Ophthalmology;  Laterality: Right;   MEMBRANE PEEL Right 05/26/2020   Procedure: MEMBRANE PEEL;  Surgeon: Bernarda Caffey, MD;  Location: [place];  Service: Ophthalmology;  Laterality: Right;   PARS PLANA VITRECTOMY Right 05/26/2020   Procedure: PARS PLANA VITRECTOMY WITH 25 GAUGE;  Surgeon: Bernarda Caffey, MD;  Location: [place];  Service: Ophthalmology;  Laterality: Right;   PERIPHERAL VASCULAR INTERVENTION Right 12/16/2020   Procedure:  PERIPHERAL VASCULAR INTERVENTION;  Surgeon: Elam Dutch, MD;  Location: [place] [facility];  Service: Cardiovascular;  Laterality: Right;  SFA (distal)   PHOTOCOAGULATION Right 05/26/2020   Procedure: PHOTOCOAGULATION;  Surgeon: Bernarda Caffey, MD;  Location: [place];  Service: Ophthalmology;  Laterality: Right;   PROSTATE BIOPSY  2015   RETINAL DETACHMENT SURGERY Right 05/06/2020   Pneumatic retinopexy for rheg RD repair - Dr. Bernarda Caffey   RETINAL DETACHMENT SURGERY Right 05/26/2020   PPV/MP - Dr. Bernarda Caffey    Current  Medications: Current Meds  Medication Sig   ALPRAZolam (XANAX) 0.25 MG tablet Take 1 tablet (0.25 mg total) by mouth 2 (two) times daily as needed for anxiety.   aspirin EC 81 MG tablet Take 1 tablet (81 mg total) by mouth daily.   atorvastatin (LIPITOR) 40 MG tablet TAKE 1 TABLET BY MOUTH ONCE A DAY.   clopidogrel (PLAVIX) 75 MG tablet TAKE (1) TABLET BY MOUTH ONCE DAILY.   diclofenac Sodium (VOLTAREN) 1 % GEL Apply 1 application topically 3 (three) times daily as needed (leg cramps/spasms).   famotidine (PEPCID) 20 MG tablet Take 1 tablet (20 mg total) by mouth 2 (two) times daily. TAKE 1 TABLET BY MOUTH ONCE DAILY.   FLUoxetine (PROZAC) 40 MG capsule Take 1 capsule (40 mg total) by mouth daily.   irbesartan (AVAPRO) 300 MG tablet TAKE 1 TABLET BY MOUTH ONCE A DAY.   nitroGLYCERIN (NITROSTAT) 0.4 MG SL tablet Place 1 tablet (0.4 mg total) under the tongue every 5 (five) minutes as needed for chest pain.   tamsulosin (FLOMAX) 0.4 MG CAPS capsule Take 1 capsule (0.4 mg total) by mouth daily. (Patient taking differently: Take 0.4 mg by mouth in the morning.)     Allergies:   Patient has no known allergies.   Social History   Socioeconomic History   Marital status: Single    Spouse name: Not on file   Number of children: 2   Years of education: 12   Highest education level: Not on file  Occupational History   Occupation: Electrical engineer   Occupation: Unemployed    Comment: disability  Tobacco Use   Smoking status: Former    Packs/day: 1.50    Years: 46.00    Pack years: 69.00    Types: Cigarettes    Quit date: 12/12/2011    Years since quitting: 9.2   Smokeless tobacco: Never  Vaping Use   Vaping Use: Never used  Substance and Sexual Activity   Alcohol use: Yes    Alcohol/week: 4.0 standard drinks    Types: 4 Cans of beer per week    Comment: a couple of beer on a weekend   Drug use: Not Currently    Types: Cocaine, Marijuana, "Crack" cocaine    Comment: over 25  years ago   Sexual activity: Not on file  Other Topics Concern   Not on file  Social History Narrative   Raised in [place], [place]. Does not have any religious beliefs that would effect healthcare. Lives in house with sister. Likes to ride motorcycle for fun.    Moving out on his own; in the next couple of weeks will be moving back home that he had rented      [place]; with a helmet   Social Determinants of Health   Financial Resource Strain: Low Risk    Difficulty of Paying Living Expenses: Not hard at all  Food Insecurity: No Food Insecurity   Worried About Running Out  of Food in the Last Year: Never true   [place] in the Last Year: Never true  Transportation Needs: No Transportation Needs   Lack of Transportation (Medical): No   Lack of Transportation (Non-Medical): No  Physical Activity: Inactive   Days of Exercise per Week: 0 days   Minutes of Exercise per Session: 0 min  Stress: Stress Concern Present   Feeling of Stress : Rather much  Social Connections: Socially Isolated   Frequency of Communication with Friends and Family: More than three times a week   Frequency of Social Gatherings with Friends and Family: Once a week   Attends Religious Services: Never   Marine scientist or Organizations: No   Attends Music therapist: Never   Marital Status: Never married     Family History: The patient's family history includes Asthma in his son; Deep vein thrombosis in his father; Diabetes in his brother; Emphysema in his maternal grandfather; Hypertension in his mother; Lung disease in his brother; Tuberculosis in his maternal grandfather. There is no history of Rectal cancer or Colon cancer.  ROS:   Please see the history of present illness.     All other systems reviewed and are negative.  EKGs/Labs/Other Studies Reviewed:    The following studies were reviewed today:  Coronary CT 03/20/2021 IMPRESSION: 1. Coronary calcium score of 1178. This was  92nd percentile for age-, sex, and race-matched controls.   2. Normal coronary origin with right dominance.   3. Minimal CAD (<25%) in the LAD/LCX.   4. Moderate to severe stenosis in the proximal RCA (60-80%). The mid and distal RCA segments are diffusely diseased with heavy calcified plaque that is mild to moderate (40-60%).   5. Mid LAD myocardial bridge (normal variant).   RECOMMENDATIONS: 1. Moderate to severe stenosis in the proximal RCA. Consider symptom-guided anti-ischemic pharmacotherapy as well as risk factor modification per guideline directed care. Additional analysis with CT FFR will be submitted.  FINDINGS: 1. Left Main: 0.99; Low likelihood of hemodynamic significance.   2. Proximal LAD: 0.95; Low likelihood of hemodynamic significance. 3. Mid LAD: 0.89; Low likelihood of hemodynamic significance. 4. Distal LAD: 0.79; Borderline likelihood of hemodynamic significance(likely tapering artifact in distal vessel). 5. LCX: 0.95; Low likelihood of hemodynamic significance. 6. Proximal RCA: 0.84; Low likelihood of hemodynamic significance. 7. Mid RCA: 0.77; Borderline likelihood of hemodynamic significance. 8. Distal RCA 0.68; High likelihood of hemodynamic significance   IMPRESSION:   1.  No significant CAD in the LAD/LCX by CT FFR.   2.  Proximal RCA with low likelihood of significance by CT FFR.   3. Mid and Distal segments of the RCA may be significant, but there is tapering of CT FFR due to multiple lesions. Would consider left heart catheterization for clarification given heavy calcifications noted in the RCA on the original CCTA.  EKG:  EKG is not ordered today.   Recent Labs: 02/09/2021: ALT 24 03/23/2021: BUN 17; Creatinine, Ser 0.68; Hemoglobin 14.3; Platelets 234; Potassium 4.1; Sodium 138  Recent Lipid Panel    Component Value Date/Time   CHOL 166 03/23/2019 0946   CHOL 137 06/11/2014 0000   TRIG 140.0 03/23/2019 0946   TRIG 173 06/11/2014 0000    HDL 51.50 03/23/2019 0946   CHOLHDL 3 03/23/2019 0946   VLDL 28.0 03/23/2019 0946   VLDL 35 06/11/2014 0000   LDLCALC 86 03/23/2019 0946     Risk Assessment/Calculations:  Physical Exam:    VS:  BP 140/80   Pulse 81   Resp 20   Ht 6' (1.829 m)   Wt 255 lb 3.2 oz (115.8 kg)   SpO2 94%   BMI 34.61 kg/m     Wt Readings from Last 3 Encounters:  03/23/21 255 lb 3.2 oz (115.8 kg)  03/09/21 263 lb 9.6 oz (119.6 kg)  02/20/21 264 lb 12.8 oz (120.1 kg)     GEN:  Well nourished, well developed in no acute distress HEENT: Normal NECK: No JVD; No carotid bruits LYMPHATICS: No lymphadenopathy CARDIAC: RRR, no murmurs, rubs, gallops RESPIRATORY:  Clear to auscultation without rales, wheezing or rhonchi  ABDOMEN: Soft, non-tender, non-distended MUSCULOSKELETAL:  No edema; No deformity  SKIN: Warm and dry NEUROLOGIC:  Alert and oriented x 3 PSYCHIATRIC:  Normal affect   ASSESSMENT:    1. Abnormal computed tomography angiography (CTA)   2. Medication management   3. Primary hypertension   4. Hyperlipidemia LDL goal <70    PLAN:    In order of problems listed above:  Abnormal coronary CT: Reviewed by Dr. Angelena Form who recommended proceeding with cardiac catheterization.  Prescribed sublingual nitroglycerin.  He has not had any further chest pain since the last office visit.  However coronary CT suggested RCA disease that would need to be further investigated by cardiac catheterization. -Risk and benefit of procedure explained to the patient who display clear understanding and agree to proceed.  Discussed with patient possible procedural risk include bleeding, vascular injury, renal injury, arrythmia, MI, stroke and loss of limb or life.   Hypertension: Blood pressure stable  Hyperlipidemia: Continue Lipitor 40 mg daily   Shared Decision Making/Informed Consent The risks [stroke (1 in 1000), death (1 in 1000), kidney failure [usually temporary] (1 in 500),  bleeding (1 in 200), allergic reaction [possibly serious] (1 in 200)], benefits (diagnostic support and management of coronary artery disease) and alternatives of a cardiac catheterization were discussed in detail with Mr. Ribeiro and he is willing to proceed.    Medication Adjustments/Labs and Tests Ordered: Current medicines are reviewed at length with the patient today.  Concerns regarding medicines are outlined above.  Orders Placed This Encounter  Procedures   CBC   Basic Metabolic Panel (BMET)   Meds ordered this encounter  Medications   nitroGLYCERIN (NITROSTAT) 0.4 MG SL tablet    Sig: Place 1 tablet (0.4 mg total) under the tongue every 5 (five) minutes as needed for chest pain.    Dispense:  25 tablet    Refill:  3    Patient Instructions  Medication Instructions:  HOLD- Irbesartan on August 24th day of your CATH  *If you need a refill on your cardiac medications before your next appointment, please call your pharmacy*   Lab Work: CBC and BMP Today  If you have labs (blood work) drawn today and your tests are completely normal, you will receive your results only by: [place] (if you have MyChart) OR A paper copy in the mail If you have any lab test that is abnormal or we need to change your treatment, we will call you to review the results.   Testing/Procedures: Your physician has requested that you have a left heart cardiac catheterization. Cardiac catheterization is used to diagnose and/or treat various heart conditions. Doctors may recommend this procedure for a number of different reasons. The most common reason is to evaluate chest pain. Chest pain can be a symptom of coronary artery disease (  CAD), and cardiac catheterization can show whether plaque is narrowing or blocking your heart's arteries. This procedure is also used to evaluate the valves, as well as measure the blood flow and oxygen levels in different parts of your heart. For further information please  visit [email or web address]. Please follow instruction sheet, as given.   Follow-Up: At [facility], you and your health needs are our priority.  As part of our continuing mission to provide you with exceptional heart care, we have created designated Provider Care Teams.  These Care Teams include your primary Cardiologist (physician) and Advanced Practice Providers (APPs -  Physician Assistants and Nurse Practitioners) who all work together to provide you with the care you need, when you need it.  We recommend signing up for the patient portal called "MyChart".  Sign up information is provided on this After Visit Summary.  MyChart is used to connect with patients for Virtual Visits (Telemedicine).  Patients are able to view lab/test results, encounter notes, upcoming appointments, etc.  Non-urgent messages can be sent to your provider as well.   To learn more about what you can do with MyChart, go to [email or web address].    Your next appointment:   1 month(s)  The format for your next appointment:   In Person  Provider:   [name], MD   Other Instructions  [place] [address] [place] [address] Dept: [phone] Loc: [place]  03/23/2021  You are scheduled for a Cardiac Catheterization on Wednesday, August 24 with Dr. [name].  1. Please arrive at the [facility] (Main Entrance A) at [facility]: [address] [place], [place] [address] at 6:30 AM (This time is two hours before your procedure to ensure your preparation). Free valet parking service is available.   Special note: Every effort is made to have your procedure done on time. Please understand that emergencies sometimes delay scheduled procedures.  2. Diet: Do not eat solid foods after midnight.  The patient may have clear liquids until 5am upon the day of the  procedure.  3. Labs: You will need to have blood drawn on Thursday, August 11 at [place]  Open: 8am - 5pm (Lunch 12:30 - 1:30)   Phone: [phone]. You do not need to be fasting.  4. Medication instructions in preparation for your procedure:   Contrast Allergy: No  Stop taking, Avapro (Irbesartan) Wednesday, August 24,  On the morning of your procedure, take your Aspirin and Plavix/Clopidogrel and any morning medicines NOT listed above.  You may use sips of water.  5. Plan for one night stay--bring personal belongings. 6. Bring a current list of your medications and current insurance cards. 7. You MUST have a responsible person to drive you home. 8. Someone MUST be with you the first 24 hours after you arrive home or your discharge will be delayed. 9. Please wear clothes that are easy to get on and off and wear slip-on shoes.  Thank you for allowing [place] to care for you!   -- [facility] Invasive Cardiovascular services    Signed, Almyra Deforest, [place]  03/25/2021 11:57 PM    [place]

## 2021-03-23 NOTE — Telephone Encounter (Signed)
Pt returned my call.  Reviewed results of cCTA and recommendations.  He will come this afternoon for appointment with APP.  Aware of time/address.

## 2021-03-24 ENCOUNTER — Telehealth: Payer: Self-pay

## 2021-03-24 LAB — CBC
Hematocrit: 43.9 % (ref 37.5–51.0)
Hemoglobin: 14.3 g/dL (ref 13.0–17.7)
MCH: 28.3 pg (ref 26.6–33.0)
MCHC: 32.6 g/dL (ref 31.5–35.7)
MCV: 87 fL (ref 79–97)
Platelets: 234 10*3/uL (ref 150–450)
RBC: 5.06 x10E6/uL (ref 4.14–5.80)
RDW: 13 % (ref 11.6–15.4)
WBC: 8.1 10*3/uL (ref 3.4–10.8)

## 2021-03-24 LAB — BASIC METABOLIC PANEL
BUN/Creatinine Ratio: 25 — ABNORMAL HIGH (ref 10–24)
BUN: 17 mg/dL (ref 8–27)
CO2: 24 mmol/L (ref 20–29)
Calcium: 9.6 mg/dL (ref 8.6–10.2)
Chloride: 100 mmol/L (ref 96–106)
Creatinine, Ser: 0.68 mg/dL — ABNORMAL LOW (ref 0.76–1.27)
Glucose: 92 mg/dL (ref 65–99)
Potassium: 4.1 mmol/L (ref 3.5–5.2)
Sodium: 138 mmol/L (ref 134–144)
eGFR: 103 mL/min/{1.73_m2} (ref >59)

## 2021-03-24 NOTE — Telephone Encounter (Signed)
Patient called in requesting an appointment early next week, advised I didn't have anything with Dr.Parker. Patient states he has been having r side abdominal pain that just hasnt gotten any better, tried sending patient through to triage but while holding with team health; patient disconnected call.

## 2021-03-25 ENCOUNTER — Encounter: Payer: Self-pay | Admitting: Physician Assistant

## 2021-03-27 ENCOUNTER — Other Ambulatory Visit: Payer: Self-pay | Admitting: Physician Assistant

## 2021-03-27 MED ORDER — SODIUM CHLORIDE 0.9% FLUSH: 3.0000 mL | Freq: Two times a day (BID) | INTRAVENOUS | Status: DC

## 2021-03-27 NOTE — Telephone Encounter (Signed)
Patient did not want triaged, scheduled with Sam on Thursday.

## 2021-03-27 NOTE — Telephone Encounter (Signed)
See below, are you willing to work pt in this week?

## 2021-03-27 NOTE — Telephone Encounter (Signed)
Please schedule pt with any Provider. Thanks

## 2021-03-27 NOTE — Progress Notes (Signed)
Pt was scheduled with APP to discuss cath which has been arranged.

## 2021-03-27 NOTE — Telephone Encounter (Signed)
Please see if we can get him in with the earliest with me or another provider.  [name]. Jerline Pain, MD 03/27/2021 10:38 AM

## 2021-03-30 ENCOUNTER — Encounter: Payer: Self-pay | Admitting: Physician Assistant

## 2021-03-30 ENCOUNTER — Ambulatory Visit (INDEPENDENT_AMBULATORY_CARE_PROVIDER_SITE_OTHER): Payer: PPO | Admitting: Physician Assistant

## 2021-03-30 ENCOUNTER — Other Ambulatory Visit: Payer: Self-pay

## 2021-03-30 VITALS — BP 130/86 | HR 61 | Temp 97.7°F | Ht 72.0 in | Wt 257.5 lb

## 2021-03-30 DIAGNOSIS — R1031 Right lower quadrant pain: Secondary | ICD-10-CM | POA: Diagnosis not present

## 2021-03-30 LAB — COMPREHENSIVE METABOLIC PANEL
ALT: 29 U/L (ref 0–53)
AST: 18 U/L (ref 0–37)
Albumin: 4.2 g/dL (ref 3.5–5.2)
Alkaline Phosphatase: 74 U/L (ref 39–117)
BUN: 17 mg/dL (ref 6–23)
CO2: 26 mEq/L (ref 19–32)
Calcium: 9.2 mg/dL (ref 8.4–10.5)
Chloride: 103 mEq/L (ref 96–112)
Creatinine, Ser: 0.67 mg/dL (ref 0.40–1.50)
GFR: 97.28 mL/min (ref >60.00)
Glucose, Bld: 95 mg/dL (ref 70–99)
Potassium: 4.6 mEq/L (ref 3.5–5.1)
Sodium: 137 mEq/L (ref 135–145)
Total Bilirubin: 0.6 mg/dL (ref 0.2–1.2)
Total Protein: 6.9 g/dL (ref 6.0–8.3)

## 2021-03-30 LAB — CBC WITH DIFFERENTIAL/PLATELET
Basophils Absolute: 0 10*3/uL (ref 0.0–0.1)
Basophils Relative: 0.6 % (ref 0.0–3.0)
Eosinophils Absolute: 0.3 10*3/uL (ref 0.0–0.7)
Eosinophils Relative: 3.9 % (ref 0.0–5.0)
HCT: 42.3 % (ref 39.0–52.0)
Hemoglobin: 14 g/dL (ref 13.0–17.0)
Lymphocytes Relative: 21.1 % (ref 12.0–46.0)
Lymphs Abs: 1.4 10*3/uL (ref 0.7–4.0)
MCHC: 33 g/dL (ref 30.0–36.0)
MCV: 86.4 fl (ref 78.0–100.0)
Monocytes Absolute: 0.7 10*3/uL (ref 0.1–1.0)
Monocytes Relative: 10.4 % (ref 3.0–12.0)
Neutro Abs: 4.2 10*3/uL (ref 1.4–7.7)
Neutrophils Relative %: 64 % (ref 43.0–77.0)
Platelets: 213 10*3/uL (ref 150.0–400.0)
RBC: 4.9 Mil/uL (ref 4.22–5.81)
RDW: 14 % (ref 11.5–15.5)
WBC: 6.6 10*3/uL (ref 4.0–10.5)

## 2021-03-30 LAB — POC URINALSYSI DIPSTICK (AUTOMATED)
Bilirubin, UA: NEGATIVE
Blood, UA: NEGATIVE
Glucose, UA: NEGATIVE
Ketones, UA: NEGATIVE
Leukocytes, UA: NEGATIVE
Nitrite, UA: NEGATIVE
Protein, UA: NEGATIVE
Spec Grav, UA: >=1.03 — AB (ref 1.010–1.025)
Urobilinogen, UA: 0.2 E.U./dL
pH, UA: 5.5 (ref 5.0–8.0)

## 2021-03-30 NOTE — Progress Notes (Signed)
Kidney function and electrolyte stable. Red blood cell count is good.

## 2021-03-30 NOTE — Progress Notes (Signed)
Todd Weeks is a 65 y.o. male here for a new problem.  I acted as a Education administrator for [employer], PA-C Anselmo Pickler, LPN   History of Present Illness:   Chief Complaint  Patient presents with  . Abdominal Pain    HPI  Abdominal pain Pt c/o right lower quadrant pain radiating into right groin for the past 3-4 weeks. Pain worse in the afternoon and has been increasing the past week, worse with bending. Pt has taken Tylenol prn. Denies back pain, fever, chills, nausea, diarrhea, rectal bleeding.  Does a lot of lifting at work but didn't have any symptoms when he does active lifting.   A couple of weeks ago did have issues with straining for urine. Has enlarged prostate that he is aware of and is followed by urology. Has had some constipation recently. Had BM this morning. Having to strain some more with bowel movements. Denies blood in urine or with BM.    Past Medical History:  Diagnosis Date  . Bell's palsy   . Cataract    Mixed form OU  . Closed nondisplaced fracture of neck of third metacarpal bone of right hand 02/04/2017  . Clotting disorder ([place])   . Depression   . Diverticulitis   . GERD (gastroesophageal reflux disease)   . Headache(784.0)   . HTN (hypertension)   . Hyperlipidemia   . Hypertensive retinopathy    OU  . Mild CAD 2012  . PVD (peripheral vascular disease) ([place])   . Sexual dysfunction      Social History   Tobacco Use  . Smoking status: Former    Packs/day: 1.50    Years: 46.00    Pack years: 69.00    Types: Cigarettes    Quit date: 12/12/2011    Years since quitting: 9.3  . Smokeless tobacco: Never  Vaping Use  . Vaping Use: Never used  Substance Use Topics  . Alcohol use: Yes    Alcohol/week: 4.0 standard drinks    Types: 4 Cans of beer per week    Comment: a couple of beer on a weekend  . Drug use: Not Currently    Types: Cocaine, Marijuana, "Crack" cocaine    Comment: over 25 years ago    Past Surgical History:  Procedure  Laterality Date  . ABDOMINAL AORTOGRAM W/LOWER EXTREMITY Bilateral 02/05/2020   Procedure: ABDOMINAL AORTOGRAM W/LOWER EXTREMITY;  Surgeon: Elam Dutch, MD;  Location: [place] [facility];  Service: Cardiovascular;  Laterality: Bilateral;  . ABDOMINAL AORTOGRAM W/LOWER EXTREMITY N/A 12/16/2020   Procedure: ABDOMINAL AORTOGRAM W/LOWER EXTREMITY;  Surgeon: Elam Dutch, MD;  Location: [facility] [facility];  Service: Cardiovascular;  Laterality: N/A;  . EYE SURGERY Right 05/06/2020   Pneumatic retinopexy for rheg. RD repair - Dr. Bernarda Caffey  . EYE SURGERY Right 05/26/2020   PPV/MP - Dr. Bernarda Caffey  . GAS INSERTION Right 05/26/2020   Procedure: INSERTION OF GAS;  Surgeon: Bernarda Caffey, MD;  Location: [place];  Service: Ophthalmology;  Laterality: Right;  . MEMBRANE PEEL Right 05/26/2020   Procedure: MEMBRANE PEEL;  Surgeon: Bernarda Caffey, MD;  Location: [place];  Service: Ophthalmology;  Laterality: Right;  . PARS PLANA VITRECTOMY Right 05/26/2020   Procedure: PARS PLANA VITRECTOMY WITH 25 GAUGE;  Surgeon: Bernarda Caffey, MD;  Location: [place];  Service: Ophthalmology;  Laterality: Right;  . PERIPHERAL VASCULAR INTERVENTION Right 12/16/2020   Procedure: PERIPHERAL VASCULAR INTERVENTION;  Surgeon: Elam Dutch, MD;  Location: [facility];  Service: Cardiovascular;  Laterality: Right;  SFA (distal)  . PHOTOCOAGULATION Right 05/26/2020   Procedure: PHOTOCOAGULATION;  Surgeon: Bernarda Caffey, MD;  Location: [place];  Service: Ophthalmology;  Laterality: Right;  . PROSTATE BIOPSY  2015  . RETINAL DETACHMENT SURGERY Right 05/06/2020   Pneumatic retinopexy for rheg RD repair - Dr. Bernarda Caffey  . RETINAL DETACHMENT SURGERY Right 05/26/2020   PPV/MP - Dr. Bernarda Caffey    Family History  Problem Relation Age of Onset  . Hypertension Mother   . Deep vein thrombosis Father   . Diabetes Brother   . Tuberculosis Maternal Grandfather   . Emphysema Maternal Grandfather   . Lung disease  Brother   . Asthma Son   . Rectal cancer Neg Hx   . Colon cancer Neg Hx     No Known Allergies  Current Medications:   Current Outpatient Medications:  .  ALPRAZolam (XANAX) 0.25 MG tablet, Take 1 tablet (0.25 mg total) by mouth 2 (two) times daily as needed for anxiety., Disp: 20 tablet, Rfl: 0 .  aspirin EC 81 MG tablet, Take 1 tablet (81 mg total) by mouth daily., Disp: 150 tablet, Rfl: 2 .  atorvastatin (LIPITOR) 40 MG tablet, TAKE 1 TABLET BY MOUTH ONCE A DAY., Disp: 30 tablet, Rfl: 0 .  clopidogrel (PLAVIX) 75 MG tablet, TAKE (1) TABLET BY MOUTH ONCE DAILY., Disp: 90 tablet, Rfl: 0 .  diclofenac Sodium (VOLTAREN) 1 % GEL, Apply 1 application topically 3 (three) times daily as needed (leg cramps/spasms)., Disp: , Rfl:  .  famotidine (PEPCID) 20 MG tablet, Take 1 tablet (20 mg total) by mouth 2 (two) times daily. TAKE 1 TABLET BY MOUTH ONCE DAILY., Disp: 90 tablet, Rfl: 0 .  FLUoxetine (PROZAC) 40 MG capsule, Take 1 capsule (40 mg total) by mouth daily., Disp: 30 capsule, Rfl: 3 .  irbesartan (AVAPRO) 300 MG tablet, TAKE 1 TABLET BY MOUTH ONCE A DAY., Disp: 30 tablet, Rfl: 0 .  tamsulosin (FLOMAX) 0.4 MG CAPS capsule, Take 1 capsule (0.4 mg total) by mouth daily. (Patient taking differently: Take 0.4 mg by mouth in the morning.), Disp: 30 capsule, Rfl: 3  Current Facility-Administered Medications:  .  sodium chloride flush (NS) 0.9 % injection 3 mL, 3 mL, Intravenous, Q12H, Meng, [place], PA   Review of Systems:   ROS Negative unless otherwise specified per HPI.  Vitals:   Vitals:   03/30/21 0824  BP: 130/86  Pulse: 61  Temp: 97.7 F (36.5 C)  TempSrc: Temporal  SpO2: 95%  Weight: 257 lb 8 oz (116.8 kg)  Height: 6' (1.829 m)     Body mass index is 34.92 kg/m.  Physical Exam:   Physical Exam Vitals and nursing note reviewed. Exam conducted with a chaperone present.  Constitutional:      Appearance: He is well-developed.  HENT:     Head: Normocephalic.  Eyes:      Conjunctiva/sclera: Conjunctivae normal.     Pupils: Pupils are equal, round, and reactive to light.  Pulmonary:     Effort: Pulmonary effort is normal.  Genitourinary:    Penis: Normal.      Testes: Normal.     Comments: No abnormalities palpated Musculoskeletal:        General: Normal range of motion.     Cervical back: Normal range of motion.  Skin:    General: Skin is warm and dry.  Neurological:     Mental Status: He is alert and oriented to person,  place, and time.  Psychiatric:        Behavior: Behavior normal.        Thought Content: Thought content normal.        Judgment: Judgment normal.    Results for orders placed or performed in visit on 03/30/21  POCT Urinalysis Dipstick (Automated)  Result Value Ref Range   Color, UA yellow    Clarity, UA clear    Glucose, UA Negative Negative   Bilirubin, UA neg    Ketones, UA neg    Spec Grav, UA >=1.030 (A) 1.010 - 1.025   Blood, UA neg    pH, UA 5.5 5.0 - 8.0   Protein, UA Negative Negative   Urobilinogen, UA 0.2 0.2 or 1.0 E.U./dL   Nitrite, UA neg    Leukocytes, UA Negative Negative    Assessment and Plan:   Silus was seen today for abdominal pain.  Diagnoses and all orders for this visit:  Abdominal pain, RLQ Exam unremarkable today. Patient was chaperoned by Dr. Jerline Pain and I also reviewed plan of care with him today. Given severity of symptoms and worsening with time, will pursue imaging. Urinalysis unremarkable. Update CMP and CBC today as well. Will obtain stat abdominal pelvis CT for further evaluation, differential includes but not limited to: Hernia, constipation, prostate issue, muscle strain, among others. -     POCT Urinalysis Dipstick (Automated) -     CBC with Differential/Platelet -     Comprehensive metabolic panel  CMA or LPN served as scribe during this visit. History, Physical, and Plan performed by medical provider. The above documentation has been reviewed and is accurate and  complete.  Inda Coke, PA-C

## 2021-03-30 NOTE — Patient Instructions (Signed)
It was great to see you!  We are getting a CT scan to figure out what is going on Please complete this as instructed  I will be in touch with these results as well as your labs  Abdominal Pain, Adult Many things can cause belly (abdominal) pain. Most times, belly pain is not dangerous. Many cases of belly pain can be watched and treated at home. Sometimes, though, belly pain is serious. Yourdoctor will try to find the cause of your belly pain. Follow these instructions at home:  Medicines Take over-the-counter and prescription medicines only as told by your doctor. Do not take medicines that help you poop (laxatives) unless told by your doctor. General instructions Watch your belly pain for any changes. Drink enough fluid to keep your pee (urine) pale yellow. Keep all follow-up visits as told by your doctor. This is important. Contact a doctor if: Your belly pain changes or gets worse. You are not hungry, or you lose weight without trying. You are having trouble pooping (constipated) or have watery poop (diarrhea) for more than 2-3 days. You have pain when you pee or poop. Your belly pain wakes you up at night. Your pain gets worse with meals, after eating, or with certain foods. You are vomiting and cannot keep anything down. You have a fever. You have blood in your pee. Get help right away if: Your pain does not go away as soon as your doctor says it should. You cannot stop vomiting. Your pain is only in areas of your belly, such as the right side or the left lower part of the belly. You have bloody or black poop, or poop that looks like tar. You have very bad pain, cramping, or bloating in your belly. You have signs of not having enough fluid or water in your body (dehydration), such as: Dark pee, very little pee, or no pee. Cracked lips. Dry mouth. Sunken eyes. Sleepiness. Weakness. You have trouble breathing or chest pain. Summary Many cases of belly pain can be  watched and treated at home. Watch your belly pain for any changes. Take over-the-counter and prescription medicines only as told by your doctor. Contact a doctor if your belly pain changes or gets worse. Get help right away if you have very bad pain, cramping, or bloating in your belly.   Take care,  Inda Coke PA-C

## 2021-03-31 ENCOUNTER — Ambulatory Visit (HOSPITAL_COMMUNITY)
Admission: RE | Admit: 2021-03-31 | Discharge: 2021-03-31 | Disposition: A | Discharge status: Home / Self Care | Payer: PPO | Source: Ambulatory Visit | Attending: Physician Assistant | Admitting: Physician Assistant

## 2021-03-31 ENCOUNTER — Other Ambulatory Visit: Payer: Self-pay | Admitting: Physician Assistant

## 2021-03-31 DIAGNOSIS — R1031 Right lower quadrant pain: Secondary | ICD-10-CM

## 2021-03-31 DIAGNOSIS — K4090 Unilateral inguinal hernia, without obstruction or gangrene, not specified as recurrent: Secondary | ICD-10-CM

## 2021-03-31 DIAGNOSIS — M5136 Other intervertebral disc degeneration, lumbar region: Secondary | ICD-10-CM | POA: Diagnosis not present

## 2021-03-31 DIAGNOSIS — K5730 Diverticulosis of large intestine without perforation or abscess without bleeding: Secondary | ICD-10-CM | POA: Diagnosis not present

## 2021-03-31 DIAGNOSIS — N400 Enlarged prostate without lower urinary tract symptoms: Secondary | ICD-10-CM | POA: Diagnosis not present

## 2021-03-31 MED ORDER — IOHEXOL 350 MG/ML SOLN
85.0000 mL | Freq: Once | INTRAVENOUS | Status: AC | PRN
  Administered 2021-03-31: 85 mL via INTRAVENOUS

## 2021-04-03 ENCOUNTER — Telehealth: Payer: Self-pay | Admitting: Cardiovascular Disease

## 2021-04-03 NOTE — Telephone Encounter (Signed)
Spoke with pt and made him aware that hernia will not interfere with heart cath.  Pt appreciative for call.

## 2021-04-03 NOTE — Telephone Encounter (Signed)
Patient was just diagnosed with a Hernea on his Right side. He wanted to know if this would impact his upcoming Heart Cath procedure. He was diagnosed at [facility] last week. Please advise

## 2021-04-04 ENCOUNTER — Telehealth: Payer: Self-pay | Admitting: *Deleted

## 2021-04-04 NOTE — Telephone Encounter (Signed)
Cardiac catheterization scheduled at [facility] for: Wednesday April 05, 2021 8:30 AM [facility] Main Entrance [facility]) at: 6:30 AM   No solid food after midnight prior to cath, clear liquids until 5 AM day of procedure.   Morning medications can be taken pre-cath with sips of water including: Aspirin 81 mg Plavix 75 mg    Confirmed patient has responsible adult to drive home post procedure and be with patient first 24 hours after arriving home.  Patients are allowed one visitor in the waiting room during the time they are at the hospital for their procedure. Both patient and visitor must wear a mask once they enter the hospital.   Patient reports does not currently have any symptoms concerning for COVID-19 and no household members with COVID-19 like illness.      Reviewed procedure/mask/visitor instructions with patient.

## 2021-04-05 ENCOUNTER — Encounter (HOSPITAL_COMMUNITY)
Admission: RE | Disposition: A | Discharge status: Home / Self Care | Payer: Self-pay | Source: Home / Self Care | Attending: Cardiovascular Disease

## 2021-04-05 ENCOUNTER — Encounter (HOSPITAL_COMMUNITY): Payer: Self-pay | Admitting: Cardiovascular Disease

## 2021-04-05 ENCOUNTER — Ambulatory Visit (HOSPITAL_COMMUNITY)
Admission: RE | Admit: 2021-04-05 | Discharge: 2021-04-05 | Disposition: A | Discharge status: Home / Self Care | Payer: PPO | Attending: Cardiovascular Disease | Admitting: Cardiovascular Disease

## 2021-04-05 ENCOUNTER — Other Ambulatory Visit: Payer: Self-pay

## 2021-04-05 DIAGNOSIS — I2510 Atherosclerotic heart disease of native coronary artery without angina pectoris: Secondary | ICD-10-CM | POA: Diagnosis not present

## 2021-04-05 DIAGNOSIS — I739 Peripheral vascular disease, unspecified: Secondary | ICD-10-CM | POA: Diagnosis not present

## 2021-04-05 DIAGNOSIS — Z7982 Long term (current) use of aspirin: Secondary | ICD-10-CM | POA: Insufficient documentation

## 2021-04-05 DIAGNOSIS — Z7902 Long term (current) use of antithrombotics/antiplatelets: Secondary | ICD-10-CM | POA: Diagnosis not present

## 2021-04-05 DIAGNOSIS — E785 Hyperlipidemia, unspecified: Secondary | ICD-10-CM | POA: Diagnosis not present

## 2021-04-05 DIAGNOSIS — Z87891 Personal history of nicotine dependence: Secondary | ICD-10-CM | POA: Diagnosis not present

## 2021-04-05 DIAGNOSIS — Z8249 Family history of ischemic heart disease and other diseases of the circulatory system: Secondary | ICD-10-CM | POA: Diagnosis not present

## 2021-04-05 DIAGNOSIS — Z79899 Other long term (current) drug therapy: Secondary | ICD-10-CM | POA: Insufficient documentation

## 2021-04-05 DIAGNOSIS — F32A Depression, unspecified: Secondary | ICD-10-CM | POA: Insufficient documentation

## 2021-04-05 DIAGNOSIS — I10 Essential (primary) hypertension: Secondary | ICD-10-CM | POA: Insufficient documentation

## 2021-04-05 DIAGNOSIS — R9430 Abnormal result of cardiovascular function study, unspecified: Secondary | ICD-10-CM | POA: Diagnosis present

## 2021-04-05 HISTORY — PX: LEFT HEART CATH AND CORONARY ANGIOGRAPHY: CATH118249

## 2021-04-05 SURGERY — LEFT HEART CATH AND CORONARY ANGIOGRAPHY
Anesthesia: LOCAL

## 2021-04-05 MED ORDER — IOHEXOL 350 MG/ML SOLN
INTRAVENOUS | Status: DC | PRN
  Administered 2021-04-05: 60 mL via INTRA_ARTERIAL

## 2021-04-05 MED ORDER — ONDANSETRON HCL 4 MG/2ML IJ SOLN: 4.0000 mg | Freq: Four times a day (QID) | INTRAMUSCULAR | Status: DC | PRN

## 2021-04-05 MED ORDER — HEPARIN (PORCINE) IN NACL 1000-0.9 UT/500ML-% IV SOLN
INTRAVENOUS | Status: AC
  Filled 2021-04-05: qty 500

## 2021-04-05 MED ORDER — LABETALOL HCL 5 MG/ML IV SOLN: 10.0000 mg | INTRAVENOUS | Status: DC | PRN

## 2021-04-05 MED ORDER — SODIUM CHLORIDE 0.9% FLUSH: 3.0000 mL | INTRAVENOUS | Status: DC | PRN

## 2021-04-05 MED ORDER — LIDOCAINE HCL (PF) 1 % IJ SOLN
INTRAMUSCULAR | Status: AC
  Filled 2021-04-05: qty 30

## 2021-04-05 MED ORDER — MIDAZOLAM HCL 2 MG/2ML IJ SOLN
INTRAMUSCULAR | Status: AC
  Filled 2021-04-05: qty 2

## 2021-04-05 MED ORDER — FENTANYL CITRATE (PF) 100 MCG/2ML IJ SOLN
INTRAMUSCULAR | Status: DC | PRN
  Administered 2021-04-05: 50 ug via INTRAVENOUS

## 2021-04-05 MED ORDER — SODIUM CHLORIDE 0.9% FLUSH: 3.0000 mL | Freq: Two times a day (BID) | INTRAVENOUS | Status: DC

## 2021-04-05 MED ORDER — SODIUM CHLORIDE 0.9 % IV SOLN: 250.0000 mL | INTRAVENOUS | Status: DC | PRN

## 2021-04-05 MED ORDER — LIDOCAINE HCL (PF) 1 % IJ SOLN
INTRAMUSCULAR | Status: DC | PRN
  Administered 2021-04-05: 2 mL via INTRADERMAL

## 2021-04-05 MED ORDER — SODIUM CHLORIDE 0.9 % WEIGHT BASED INFUSION
3.0000 mL/kg/h | INTRAVENOUS | Status: AC
  Administered 2021-04-05: 3 mL/kg/h via INTRAVENOUS

## 2021-04-05 MED ORDER — ACETAMINOPHEN 325 MG PO TABS
650.0000 mg | ORAL_TABLET | ORAL | Status: DC | PRN
Start: 2021-04-05 — End: 2021-04-05

## 2021-04-05 MED ORDER — HEPARIN SODIUM (PORCINE) 1000 UNIT/ML IJ SOLN
INTRAMUSCULAR | Status: DC | PRN
  Administered 2021-04-05: 6000 [IU] via INTRAVENOUS

## 2021-04-05 MED ORDER — VERAPAMIL HCL 2.5 MG/ML IV SOLN
INTRAVENOUS | Status: DC | PRN
  Administered 2021-04-05: 10 mL via INTRA_ARTERIAL

## 2021-04-05 MED ORDER — MIDAZOLAM HCL 2 MG/2ML IJ SOLN
INTRAMUSCULAR | Status: DC | PRN
  Administered 2021-04-05: 2 mg via INTRAVENOUS

## 2021-04-05 MED ORDER — HEPARIN (PORCINE) IN NACL 1000-0.9 UT/500ML-% IV SOLN
INTRAVENOUS | Status: DC | PRN
  Administered 2021-04-05 (×2): 500 mL

## 2021-04-05 MED ORDER — SODIUM CHLORIDE 0.9 % IV SOLN: INTRAVENOUS | Status: DC

## 2021-04-05 MED ORDER — SODIUM CHLORIDE 0.9 % WEIGHT BASED INFUSION: 1.0000 mL/kg/h | INTRAVENOUS | Status: DC

## 2021-04-05 MED ORDER — VERAPAMIL HCL 2.5 MG/ML IV SOLN
INTRAVENOUS | Status: AC
  Filled 2021-04-05: qty 2

## 2021-04-05 MED ORDER — ASPIRIN 81 MG PO CHEW: 81.0000 mg | CHEWABLE_TABLET | ORAL | Status: DC

## 2021-04-05 MED ORDER — HYDRALAZINE HCL 20 MG/ML IJ SOLN: 10.0000 mg | INTRAMUSCULAR | Status: DC | PRN

## 2021-04-05 MED ORDER — HEPARIN SODIUM (PORCINE) 1000 UNIT/ML IJ SOLN
INTRAMUSCULAR | Status: AC
  Filled 2021-04-05: qty 1

## 2021-04-05 MED ORDER — FENTANYL CITRATE (PF) 100 MCG/2ML IJ SOLN
INTRAMUSCULAR | Status: AC
  Filled 2021-04-05: qty 2

## 2021-04-05 SURGICAL SUPPLY — 10 items
CATH 5FR JL3.5 JR4 ANG PIG MP (CATHETERS) ×2 IMPLANT
CATH INFINITI 5 FR 3DRC (CATHETERS) ×2 IMPLANT
DEVICE RAD COMP TR BAND LRG (VASCULAR PRODUCTS) ×2 IMPLANT
GLIDESHEATH SLEND SS 6F .021 (SHEATH) ×2 IMPLANT
GUIDEWIRE INQWIRE 1.5J.035X260 (WIRE) ×1 IMPLANT
INQWIRE 1.5J .035X260CM (WIRE) ×2
KIT HEART LEFT (KITS) ×2 IMPLANT
PACK CARDIAC CATHETERIZATION (CUSTOM PROCEDURE TRAY) ×2 IMPLANT
TRANSDUCER W/STOPCOCK (MISCELLANEOUS) ×2 IMPLANT
TUBING CIL FLEX 10 FLL-RA (TUBING) ×2 IMPLANT

## 2021-04-05 NOTE — Interval H&P Note (Signed)
History and Physical Interval Note:  04/05/2021 7:44 AM  Todd Weeks  has presented today for surgery, with the diagnosis of abnormal coronary CT.  The various methods of treatment have been discussed with the patient and family. After consideration of risks, benefits and other options for treatment, the patient has consented to  Procedure(s): LEFT HEART CATH AND CORONARY ANGIOGRAPHY (N/A) as a surgical intervention.  The patient's history has been reviewed, patient examined, no change in status, stable for surgery.  I have reviewed the patient's chart and labs.  Questions were answered to the patient's satisfaction.    Cath Lab Visit (complete for each Cath Lab visit)  Clinical Evaluation Leading to the Procedure:   ACS: No.  Non-ACS:    Anginal Classification: CCS II  Anti-ischemic medical therapy: Minimal Therapy (1 class of medications)  Non-Invasive Test Results: High-risk stress test findings: cardiac mortality >3%/year (Coronary CTA with possible severe RCA stenosis)  Prior CABG: Previous CABG        Todd Weeks

## 2021-04-10 ENCOUNTER — Encounter (INDEPENDENT_AMBULATORY_CARE_PROVIDER_SITE_OTHER): Payer: PPO | Admitting: Ophthalmology

## 2021-04-10 DIAGNOSIS — H3581 Retinal edema: Secondary | ICD-10-CM

## 2021-04-10 DIAGNOSIS — H35371 Puckering of macula, right eye: Secondary | ICD-10-CM

## 2021-04-10 DIAGNOSIS — H3321 Serous retinal detachment, right eye: Secondary | ICD-10-CM

## 2021-04-10 DIAGNOSIS — H25812 Combined forms of age-related cataract, left eye: Secondary | ICD-10-CM

## 2021-04-10 DIAGNOSIS — H33312 Horseshoe tear of retina without detachment, left eye: Secondary | ICD-10-CM

## 2021-04-10 DIAGNOSIS — H35033 Hypertensive retinopathy, bilateral: Secondary | ICD-10-CM

## 2021-04-10 DIAGNOSIS — Z961 Presence of intraocular lens: Secondary | ICD-10-CM

## 2021-04-10 DIAGNOSIS — H25813 Combined forms of age-related cataract, bilateral: Secondary | ICD-10-CM

## 2021-04-10 DIAGNOSIS — I10 Essential (primary) hypertension: Secondary | ICD-10-CM

## 2021-04-15 ENCOUNTER — Other Ambulatory Visit: Payer: Self-pay | Admitting: Cardiovascular Disease

## 2021-04-24 ENCOUNTER — Telehealth: Payer: Self-pay | Admitting: *Deleted

## 2021-04-24 ENCOUNTER — Ambulatory Visit (INDEPENDENT_AMBULATORY_CARE_PROVIDER_SITE_OTHER)
Admission: RE | Admit: 2021-04-24 | Discharge: 2021-04-24 | Disposition: A | Discharge status: Home / Self Care | Payer: PPO | Source: Ambulatory Visit | Attending: Surgery | Admitting: Surgery

## 2021-04-24 ENCOUNTER — Other Ambulatory Visit: Payer: Self-pay

## 2021-04-24 ENCOUNTER — Other Ambulatory Visit: Payer: Self-pay | Admitting: Vascular Surgery

## 2021-04-24 ENCOUNTER — Ambulatory Visit: Payer: Self-pay | Admitting: General Surgery

## 2021-04-24 ENCOUNTER — Other Ambulatory Visit (HOSPITAL_COMMUNITY): Payer: Self-pay | Admitting: Surgery

## 2021-04-24 ENCOUNTER — Ambulatory Visit (INDEPENDENT_AMBULATORY_CARE_PROVIDER_SITE_OTHER): Payer: PPO | Admitting: Physician Assistant

## 2021-04-24 ENCOUNTER — Ambulatory Visit (HOSPITAL_COMMUNITY)
Admission: RE | Admit: 2021-04-24 | Discharge: 2021-04-24 | Disposition: A | Discharge status: Home / Self Care | Payer: PPO | Source: Ambulatory Visit | Attending: Surgery | Admitting: Surgery

## 2021-04-24 VITALS — BP 139/82 | HR 69 | Temp 98.1°F | Ht 72.0 in | Wt 257.6 lb

## 2021-04-24 DIAGNOSIS — I83893 Varicose veins of bilateral lower extremities with other complications: Secondary | ICD-10-CM | POA: Diagnosis not present

## 2021-04-24 DIAGNOSIS — I739 Peripheral vascular disease, unspecified: Secondary | ICD-10-CM

## 2021-04-24 DIAGNOSIS — I70219 Atherosclerosis of native arteries of extremities with intermittent claudication, unspecified extremity: Secondary | ICD-10-CM

## 2021-04-24 DIAGNOSIS — M7989 Other specified soft tissue disorders: Secondary | ICD-10-CM

## 2021-04-24 DIAGNOSIS — K4090 Unilateral inguinal hernia, without obstruction or gangrene, not specified as recurrent: Secondary | ICD-10-CM | POA: Diagnosis not present

## 2021-04-24 NOTE — Progress Notes (Signed)
Office Note     CC:  follow up Requesting Provider:  Vivi Barrack, MD  HPI: Todd Weeks is a 65 y.o. ([birth year]) male who presents to go over vascular studies related to PAD.  He underwent right SFA stent by Dr. Oneida Alar on 12/16/2020.  He had immediate resolution of claudication symptoms of right lower extremity.  He continues to be without claudication symptoms of bilateral lower extremities.  He has a known left SFA occlusion.  He denies rest pain or nonhealing wounds of bilateral lower extremities.  He is on aspirin, Plavix, statin daily.  He denies tobacco use.  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  He has known varicose veins however manages symptoms with compression stockings.   Past Medical History:  Diagnosis Date   Bell's palsy    Cataract    Mixed form OU   Closed nondisplaced fracture of neck of third metacarpal bone of right hand 02/04/2017   Clotting disorder ([place])    Depression    Diverticulitis    GERD (gastroesophageal reflux disease)    Headache(784.0)    HTN (hypertension)    Hyperlipidemia    Hypertensive retinopathy    OU   Mild CAD 2012   PVD (peripheral vascular disease) ([facility])    Sexual dysfunction     Past Surgical History:  Procedure Laterality Date   ABDOMINAL AORTOGRAM W/LOWER EXTREMITY Bilateral 02/05/2020   Procedure: ABDOMINAL AORTOGRAM W/LOWER EXTREMITY;  Surgeon: Elam Dutch, MD;  Location: [place] [facility];  Service: Cardiovascular;  Laterality: Bilateral;   ABDOMINAL AORTOGRAM W/LOWER EXTREMITY N/A 12/16/2020   Procedure: ABDOMINAL AORTOGRAM W/LOWER EXTREMITY;  Surgeon: Elam Dutch, MD;  Location: [place] [facility];  Service: Cardiovascular;  Laterality: N/A;   EYE SURGERY Right 05/06/2020   Pneumatic retinopexy for rheg. RD repair - Dr. Bernarda Caffey   EYE SURGERY Right 05/26/2020   PPV/MP - Dr. Bernarda Caffey   GAS INSERTION Right 05/26/2020   Procedure: INSERTION OF GAS;  Surgeon: Bernarda Caffey, MD;  Location: [place];   Service: Ophthalmology;  Laterality: Right;   LEFT HEART CATH AND CORONARY ANGIOGRAPHY N/A 04/05/2021   Procedure: LEFT HEART CATH AND CORONARY ANGIOGRAPHY;  Surgeon: Burnell Blanks, MD;  Location: [place] [facility];  Service: Cardiovascular;  Laterality: N/A;   MEMBRANE PEEL Right 05/26/2020   Procedure: MEMBRANE PEEL;  Surgeon: Bernarda Caffey, MD;  Location: [place];  Service: Ophthalmology;  Laterality: Right;   PARS PLANA VITRECTOMY Right 05/26/2020   Procedure: PARS PLANA VITRECTOMY WITH 25 GAUGE;  Surgeon: Bernarda Caffey, MD;  Location: [place];  Service: Ophthalmology;  Laterality: Right;   PERIPHERAL VASCULAR INTERVENTION Right 12/16/2020   Procedure: PERIPHERAL VASCULAR INTERVENTION;  Surgeon: Elam Dutch, MD;  Location: [place] [facility];  Service: Cardiovascular;  Laterality: Right;  SFA (distal)   PHOTOCOAGULATION Right 05/26/2020   Procedure: PHOTOCOAGULATION;  Surgeon: Bernarda Caffey, MD;  Location: [place];  Service: Ophthalmology;  Laterality: Right;   PROSTATE BIOPSY  2015   RETINAL DETACHMENT SURGERY Right 05/06/2020   Pneumatic retinopexy for rheg RD repair - Dr. Bernarda Caffey   RETINAL DETACHMENT SURGERY Right 05/26/2020   PPV/MP - Dr. 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[employer]   Occupation: Unemployed    Comment: disability  Tobacco Use   Smoking status: Former    Packs/day: 1.50    Years: 46.00    Pack years: 69.00    Types: Cigarettes    Quit date: 12/12/2010    Years since quitting: 10.3   Smokeless tobacco: Never  Vaping Use   Vaping Use: Never used  Substance and Sexual Activity   Alcohol use: Yes    Alcohol/week: 4.0 standard drinks    Types: 4 Cans of beer per week    Comment: a couple of beer on a weekend   Drug use: Not Currently     Types: Cocaine, Marijuana, "Crack" cocaine    Comment: over 25 years ago   Sexual activity: Not on file  Other Topics Concern   Not on file  Social History Narrative   Raised in [place], [place]. Does not have any religious beliefs that would effect healthcare. Lives in house with sister. Likes to ride motorcycle for fun.    Moving out on his own; in the next couple of weeks will be moving back home that he had rented      [employer]; with a helmet   Social Determinants of Health   Financial Resource Strain: Low Risk    Difficulty of Paying Living Expenses: Not hard at all  Food Insecurity: No Food Insecurity   Worried About Charity fundraiser in the Last Year: Never true   [place] in the Last Year: Never true  Transportation Needs: No Transportation Needs   Lack of Transportation (Medical): No   Lack of Transportation (Non-Medical): No  Physical Activity: Inactive   Days of Exercise per Week: 0 days   Minutes of Exercise per Session: 0 min  Stress: Stress Concern Present   Feeling of Stress : Rather much  Social Connections: Socially Isolated   Frequency of Communication with Friends and Family: More than three times a week   Frequency of Social Gatherings with Friends and Family: Once a week   Attends Religious Services: Never   Marine scientist or Organizations: No   Attends Music therapist: Never   Marital Status: Never married  Human resources officer Violence: Not At Risk   Fear of Current or Ex-Partner: No   Emotionally Abused: No   Physically Abused: No   Sexually Abused: No    Family History  Problem Relation Age of Onset   Hypertension Mother    Deep vein thrombosis Father    Diabetes Brother    Tuberculosis Maternal Grandfather    Emphysema Maternal Grandfather    Lung disease Brother    Asthma Son    Rectal cancer Neg Hx    Colon cancer Neg Hx     Current Outpatient Medications  Medication Sig Dispense Refill   ALPRAZolam (XANAX)  0.25 MG tablet Take 1 tablet (0.25 mg total) by mouth 2 (two) times daily as needed for anxiety. 20 tablet 0   aspirin EC 81 MG tablet Take 1 tablet (81 mg total) by mouth daily. 150 tablet 2   atorvastatin (LIPITOR) 40 MG tablet TAKE 1 TABLET BY MOUTH ONCE A DAY. 30 tablet 0   clopidogrel (PLAVIX) 75 MG tablet TAKE (1) TABLET BY MOUTH ONCE DAILY. 90 tablet 0   diclofenac Sodium (VOLTAREN) 1 % GEL Apply 1 application topically 3 (three) times daily as needed (leg cramps/spasms).     famotidine (PEPCID) 20 MG tablet Take 1 tablet (20  mg total) by mouth 2 (two) times daily. TAKE 1 TABLET BY MOUTH ONCE DAILY. (Patient taking differently: Take 20 mg by mouth See admin instructions. Take 1 tablet (20 mg) by mouth scheduled every morning & may take an additional dose in the evening if needed for acid reflux/indigestion.) 90 tablet 0   FLUoxetine (PROZAC) 40 MG capsule Take 1 capsule (40 mg total) by mouth daily. 30 capsule 3   irbesartan (AVAPRO) 300 MG tablet TAKE 1 TABLET BY MOUTH ONCE A DAY. 30 tablet 0   tamsulosin (FLOMAX) 0.4 MG CAPS capsule Take 1 capsule (0.4 mg total) by mouth daily. 30 capsule 3   Current Facility-Administered Medications  Medication Dose Route Frequency Provider Last Rate Last Admin   sodium chloride flush (NS) 0.9 % injection 3 mL  3 mL Intravenous Q12H Meng, Isaac Laud, PA        No Known Allergies   REVIEW OF SYSTEMS:   '[X]'$  denotes positive finding, '[ ]'$  denotes negative finding Cardiac  Comments:  Chest pain or chest pressure:    Shortness of breath upon exertion:    Short of breath when lying flat:    Irregular heart rhythm:        Vascular    Pain in calf, thigh, or hip brought on by ambulation:    Pain in feet at night that wakes you up from your sleep:     Blood clot in your veins:    Leg swelling:         Pulmonary    Oxygen at home:    Productive cough:     Wheezing:         Neurologic    Sudden weakness in arms or legs:     Sudden numbness in arms or  legs:     Sudden onset of difficulty speaking or slurred speech:    Temporary loss of vision in one eye:     Problems with dizziness:         Gastrointestinal    Blood in stool:     Vomited blood:         Genitourinary    Burning when urinating:     Blood in urine:        Psychiatric    Major depression:         Hematologic    Bleeding problems:    Problems with blood clotting too easily:        Skin    Rashes or ulcers:        Constitutional    Fever or chills:      PHYSICAL EXAMINATION:  Vitals:   04/24/21 1103  BP: 139/82  Pulse: 69  Temp: 98.1 F (36.7 C)  TempSrc: Skin  SpO2: 96%  Weight: 257 lb 9.6 oz (116.8 kg)  Height: 6' (1.829 m)    General:  WDWN in NAD; vital signs documented above Gait: Not observed HENT: WNL, normocephalic Pulmonary: normal non-labored breathing , without Rales, rhonchi,  wheezing Cardiac: regular HR Abdomen: soft, NT, no masses Skin: without rashes Vascular Exam/Pulses:  Right Left  Radial 2+ (normal) 2+ (normal)  DP absent absent  PT 2+ (normal) absent   Extremities: without ischemic changes, without Gangrene , without cellulitis; without open wounds; varicosities of bilateral lower legs without other venous skin changes or venous ulceration Musculoskeletal: no muscle wasting or atrophy  Neurologic: A&O X 3;  No focal weakness or paresthesias are detected Psychiatric:  The pt has Normal affect.  Non-Invasive Vascular Imaging:   R SFA stent patent  ABI/TBIToday's ABIToday's TBIPrevious ABIPrevious TBI  +-------+-----------+-----------+------------+------------+  Right  1.13       0.71       0.91        0.73          +-------+-----------+-----------+------------+------------+  Left   0.81       0.65       0.79        0.50          +-------+-----------+-----------+------------+------------+     ASSESSMENT/PLAN:: 65 y.o. male here for surveillance of PAD and R SFA stent  - Patient continues to be  without claudication symptoms after R SFA stenting; L SFA occlusion is asymptomatic currently; no indication for revascularization of left SFA -Continue aspirin, Plavix, statin daily Recheck right leg arterial duplex and ABIs in 1 year -Continue compression and elevation for management of venous symptoms   Dagoberto Ligas, PA-C [facility] [phone]  Clinic MD:   Trula Slade

## 2021-04-24 NOTE — Telephone Encounter (Signed)
   [facility] Pre-operative Risk Assessment    Patient Name: Todd Weeks  DOB: 1955-11-22 MRN: [id number]  HEARTCARE STAFF:  - IMPORTANT!!!!!! Under Visit Info/Reason for Call, type in Other and utilize the format Clearance MM/DD/YY or Clearance TBD. Do not use dashes or single digits. - Please review there is not already an duplicate clearance open for this procedure. - If request is for dental extraction, please clarify the # of teeth to be extracted. - If the patient is currently at the dentist's office, call Pre-Op Callback Staff (MA/nurse) to input urgent request.  - If the patient is not currently in the dentist office, please route to the Pre-Op pool.  Request for surgical clearance:  What type of surgery is being performed?  HERNIA SURGERY  When is this surgery scheduled?  TBD  What type of clearance is required (medical clearance vs. Pharmacy clearance to hold med vs. Both)?  BOTH  Are there any medications that need to be held prior to surgery and how [place]?  ASPIRIN  Practice name and name of physician performing surgery?  [facility] / DR. TOTH  What is the office phone number?  [phone]   7.   What is the office fax number?  [phone] ATTN:  MICHELLE  8.   Anesthesia type (None, local, MAC, general) ?  GENERAL   Jeanann Lewandowsky 04/24/2021, 4:09 PM  _________________________________________________________________   (provider comments below)

## 2021-04-25 NOTE — Telephone Encounter (Signed)
   Name: Todd Weeks  DOB: [birth year]  MRN: [id number]  Primary Cardiologist: [name], MD  Chart reviewed as part of pre-operative protocol coverage. Because of [name]'s past medical history and time since last visit, he will require a follow-up visit in order to better assess preoperative cardiovascular risk.  Pt had heart cath without intervention and has not been seen back in the office yet. Scheduled to see PA Meng on 05/01/21. He will address preop clearance at that time, if cath site without issue.   Pre-op covering staff: - Please schedule appointment and call patient to inform them. If patient already had an upcoming appointment within acceptable timeframe, please add "pre-op clearance" to the appointment notes so provider is aware. - Please contact requesting surgeon's office via preferred method (i.e, phone, fax) to inform them of need for appointment prior to surgery.  If applicable, this message will also be routed to pharmacy pool and/or primary cardiologist for input on holding anticoagulant/antiplatelet agent as requested below so that this information is available to the clearing provider at time of patient's appointment.   [place], PA  04/25/2021, 11:24 AM

## 2021-04-26 ENCOUNTER — Other Ambulatory Visit: Payer: Self-pay | Admitting: Family Medicine

## 2021-04-26 DIAGNOSIS — I2510 Atherosclerotic heart disease of native coronary artery without angina pectoris: Secondary | ICD-10-CM

## 2021-04-26 DIAGNOSIS — K219 Gastro-esophageal reflux disease without esophagitis: Secondary | ICD-10-CM

## 2021-05-01 ENCOUNTER — Telehealth: Payer: Self-pay | Admitting: Pulmonary Disease

## 2021-05-01 ENCOUNTER — Ambulatory Visit: Payer: PPO | Admitting: Physician Assistant

## 2021-05-02 NOTE — Telephone Encounter (Signed)
Called and spoke to pt. Pt states he still hasnt heard anything from the DME ([employer]) regarding his new CPAP start. Advised pt about the national shortage with Resmed and Respironics, pt was unaware. Per pt's chart he has severe OSA. Called Melissa with [employer] to see when he would be getting his machine considering he has severe OSA. Melissa states she is unsure but they have several LUNA machines in stock.    Dr. Erin Fulling, please advise if ok to issue a LUNA machine instead of waiting for a Resmed or Respironics. Thanks.

## 2021-05-04 NOTE — Telephone Encounter (Signed)
Given that he has severe obstructive sleep apnea it would be better that he start CPAP therapy sooner rather than later. The LUNA machines will do the job as far as treating his sleep apnea, but they are a newer company compared to [employer]	[place]. If he would like to proceed with starting CPAP sooner than later, then the LUNA machine will work just fine and he will have this machine for 5 years until his insurance will cover a new machine. Otherwise we are left with the wait due to the CPAP shortages for the other brands.  Thanks, Wille Glaser

## 2021-05-04 NOTE — Telephone Encounter (Signed)
Pt has appointment with Richardson Dopp, PA-C 9/27.

## 2021-05-04 NOTE — Telephone Encounter (Signed)
I s/w the pt and he is agreeable to an appt for pre op clearance. Pt agreeable to appt for 9/27 @ 10:45 with Richardson Dopp, PAC. Pt aware appt at the [facility] office. I will forward notes to [facility] for upcoming appt. Will send FYI to surgeon's office pt has appt. Pt was originally scheduled for 05/01/21 with our office though cancelled that appt.

## 2021-05-04 NOTE — Progress Notes (Addendum)
[facility] Note  05/08/2021     CHIEF COMPLAINT Patient presents for Retina Follow Up   HISTORY OF PRESENT ILLNESS: Todd Weeks is a 65 y.o. male who presents to the clinic today for:  HPI     Retina Follow Up   Patient presents with  Retinal Break/Detachment.  In right eye.  This started 1 year ago.  Severity is moderate.  Duration of 4.5 months.  Since onset it is stable.  I, the attending physician,  performed the HPI with the patient and updated documentation appropriately.        Comments   65 y/o male pt here for 4.5 mo f/u for RD OD.  No change in [place] OU.  Pt still reports some difficulty focusing with OD.  Denies pain, FOL, floaters.  No gtts.      Last edited by Bernarda Caffey, MD on 05/09/2021 12:51 PM.    Pt feels like his eyes are still not working together, which is making his depth perception off, his right eye still has "waviness" in it, he says glasses help "somewhat"   Referring physician: Vivi Barrack, MD [place],  [place] [address]  HISTORICAL INFORMATION:   Selected notes from the [facility]: No current outpatient medications on file. (Ophthalmic Drugs)   No current facility-administered medications for this visit. (Ophthalmic Drugs)   Current Outpatient Medications (Other)  Medication Sig   ALPRAZolam (XANAX) 0.25 MG tablet Take 1 tablet (0.25 mg total) by mouth 2 (two) times daily as needed for anxiety.   aspirin EC 81 MG tablet Take 1 tablet (81 mg total) by mouth daily.   atorvastatin (LIPITOR) 40 MG tablet TAKE 1 TABLET BY MOUTH ONCE A DAY.   clopidogrel (PLAVIX) 75 MG tablet TAKE (1) TABLET BY MOUTH ONCE DAILY.   diclofenac Sodium (VOLTAREN) 1 % GEL Apply 1 application topically 3 (three) times daily as needed (leg cramps/spasms).   famotidine (PEPCID) 20 MG tablet TAKE 1 TABLET BY MOUTH ONCE DAILY.   FLUoxetine (PROZAC) 40 MG capsule Take 1 capsule (40 mg total) by  mouth daily.   irbesartan (AVAPRO) 300 MG tablet TAKE 1 TABLET BY MOUTH ONCE A DAY.   nitroGLYCERIN (NITROSTAT) 0.4 MG SL tablet Place 0.4 mg under the tongue every 5 (five) minutes as needed for chest pain.   tamsulosin (FLOMAX) 0.4 MG CAPS capsule Take 1 capsule (0.4 mg total) by mouth daily.   Current Facility-Administered Medications (Other)  Medication Route   sodium chloride flush (NS) 0.9 % injection 3 mL Intravenous   REVIEW OF SYSTEMS: ROS   Positive for: Gastrointestinal, Neurological, Cardiovascular, Eyes Negative for: Constitutional, Skin, Genitourinary, Musculoskeletal, HENT, Endocrine, Respiratory, Psychiatric, Allergic/Imm, Heme/Lymph Last edited by Matthew Folks, COA on 05/08/2021  8:23 AM.     ALLERGIES No Known Allergies  PAST MEDICAL HISTORY Past Medical History:  Diagnosis Date   Bell's palsy    Cataract    Mixed form OU   Closed nondisplaced fracture of neck of third metacarpal bone of right hand 02/04/2017   Clotting disorder (HCC)    Depression    Diverticulitis    GERD (gastroesophageal reflux disease)    Headache(784.0)    HTN (hypertension)    Hyperlipidemia    Hypertensive retinopathy    OU   Mild CAD 2012   PVD (peripheral vascular disease) ([place])    Retinal detachment    Sexual dysfunction  Past Surgical History:  Procedure Laterality Date   ABDOMINAL AORTOGRAM W/LOWER EXTREMITY Bilateral 02/05/2020   Procedure: ABDOMINAL AORTOGRAM W/LOWER EXTREMITY;  Surgeon: Elam Dutch, MD;  Location: [facility];  Service: Cardiovascular;  Laterality: Bilateral;   ABDOMINAL AORTOGRAM W/LOWER EXTREMITY N/A 12/16/2020   Procedure: ABDOMINAL AORTOGRAM W/LOWER EXTREMITY;  Surgeon: Elam Dutch, MD;  Location: [facility];  Service: Cardiovascular;  Laterality: N/A;   CATARACT EXTRACTION     EYE SURGERY Right 05/06/2020   Pneumatic retinopexy for rheg. RD repair - Dr. Bernarda Caffey   EYE SURGERY Right 05/26/2020   PPV/MP - Dr.  Bernarda Caffey   GAS INSERTION Right 05/26/2020   Procedure: INSERTION OF GAS;  Surgeon: Bernarda Caffey, MD;  Location: [facility];  Service: Ophthalmology;  Laterality: Right;   LEFT HEART CATH AND CORONARY ANGIOGRAPHY N/A 04/05/2021   Procedure: LEFT HEART CATH AND CORONARY ANGIOGRAPHY;  Surgeon: Burnell Blanks, MD;  Location: [facility];  Service: Cardiovascular;  Laterality: N/A;   MEMBRANE PEEL Right 05/26/2020   Procedure: MEMBRANE PEEL;  Surgeon: Bernarda Caffey, MD;  Location: [facility];  Service: Ophthalmology;  Laterality: Right;   PARS PLANA VITRECTOMY Right 05/26/2020   Procedure: PARS PLANA VITRECTOMY WITH 25 GAUGE;  Surgeon: Bernarda Caffey, MD;  Location: [facility];  Service: Ophthalmology;  Laterality: Right;   PERIPHERAL VASCULAR INTERVENTION Right 12/16/2020   Procedure: PERIPHERAL VASCULAR INTERVENTION;  Surgeon: Elam Dutch, MD;  Location: [facility];  Service: Cardiovascular;  Laterality: Right;  SFA (distal)   PHOTOCOAGULATION Right 05/26/2020   Procedure: PHOTOCOAGULATION;  Surgeon: Bernarda Caffey, MD;  Location: [facility];  Service: Ophthalmology;  Laterality: Right;   PROSTATE BIOPSY  2015   RETINAL DETACHMENT SURGERY Right 05/06/2020   Pneumatic retinopexy for rheg RD repair - Dr. Bernarda Caffey   RETINAL DETACHMENT SURGERY Right 05/26/2020   PPV/MP - Dr. Bernarda Caffey    FAMILY HISTORY Family History  Problem Relation Age of Onset   Hypertension Mother    Deep vein thrombosis Father    Diabetes Brother    Tuberculosis Maternal Grandfather    Emphysema Maternal Grandfather    Lung disease Brother    Asthma Son    Rectal cancer Neg Hx    Colon cancer Neg Hx     SOCIAL HISTORY Social History   Tobacco Use   Smoking status: Former    Packs/day: 1.50    Years: 46.00    Pack years: 69.00    Types: Cigarettes    Quit date: 12/12/2010    Years since quitting: 10.4   Smokeless tobacco: Never  Vaping Use   Vaping Use: Never used  Substance Use Topics    Alcohol use: Yes    Alcohol/week: 4.0 standard drinks    Types: 4 Cans of beer per week    Comment: a couple of beer on a weekend   Drug use: Not Currently    Types: Cocaine, Marijuana, "Crack" cocaine    Comment: over 25 years ago       OPHTHALMIC EXAM:  Base Eye Exam     Visual Acuity (Snellen - Linear)       Right Left   Dist [place] 20/30 -2 20/25 -2   Dist ph [place] 20/25 -2 20/20 -2         Tonometry (Tonopen, 8:25 AM)       Right Left   Pressure 14 15         Pupils  Dark Light Shape React APD   Right 5 5 Round Minimal None   Left 3 2 Round Brisk None         Visual Fields (Counting fingers)       Left Right    Full Full         Extraocular Movement       Right Left    Full, Ortho Full, Ortho         Neuro/Psych     Oriented x3: Yes   Mood/Affect: Normal         Dilation     Both eyes: 1.0% Mydriacyl, 2.5% Phenylephrine @ 8:25 AM           Slit Lamp and Fundus Exam     External Exam       Right Left   External Normal Normal         Slit Lamp Exam       Right Left   Lids/Lashes Dermatochalasis - upper lid Normal   Conjunctiva/Sclera White and quiet White and quiet   Cornea Trace Punctate epithelial erosions, well healed cataract wound trace Punctate epithelial erosions   Anterior Chamber Deep and clear, narrow temporal angle Deep and quiet   Iris Round and dilated, device at 0230 angle Round and dilated   Lens PC IOL in good position, trace Posterior capsular opacification 2+ Nuclear sclerosis, 2+ Cortical cataract   Vitreous post vitrectomy, clear Vitreous syneresis, Posterior vitreous detachment         Fundus Exam       Right Left   Disc Pink and Sharp, +cupping Pink and Sharp   C/D Ratio 0.6 0.6   Macula Flat; blunted foveal reflex, ERM gone; mild RPE mottling, scattered MA, interval improvement in focal pockets of SRF, only small residual one nasal macula Flat, Blunted foveal reflex, mild ERM, No heme or  edema   Vessels attenuated, mild Copper wiring, mild AV crossing changes attenuated, mild tortuousity   Periphery Attached, good cryo ST quad, good 360 laser changes; ORIGINALLY: Temporal mac off detachment w/ HST at 1030 Attached, HST at 0130 no SRF--good laser changes surrounding, mild inferior paving stone degeneration, No new RT/RD            IMAGING AND PROCEDURES  Imaging and Procedures for 05/08/2021  OCT, Retina - OU - Both Eyes       Right Eye Quality was good. Central Foveal Thickness: 275. Progression has improved. Findings include subretinal fluid, no IRF, abnormal foveal contour, outer retinal atrophy (Trace focal pocket of SRF IN macula, remainder are resolved).   Left Eye Quality was good. Central Foveal Thickness: 281. Progression has been stable. Findings include normal foveal contour, no IRF, no SRF, epiretinal membrane (Trace, focal ERM nasal macula).   Notes *Images captured and stored on drive  Diagnosis / Impression:  OD: Trace focal pocket of SRF IN macula, remainder are resolved OS: Trace, focal ERM nasal macula  Clinical management:  See below  Abbreviations: NFP - Normal foveal profile. CME - cystoid macular edema. PED - pigment epithelial detachment. IRF - intraretinal fluid. SRF - subretinal fluid. EZ - ellipsoid zone. ERM - epiretinal membrane. ORA - outer retinal atrophy. ORT - outer retinal tubulation. SRHM - subretinal hyper-reflective material. IRHM - intraretinal hyper-reflective material            ASSESSMENT/PLAN:    ICD-10-CM   1. Right retinal detachment  H33.21     2. Retinal edema  H35.81 OCT,  Retina - OU - Both Eyes    3. Epiretinal membrane (ERM) of right eye  H35.371     4. Retinal tear of left eye  H33.312     5. Essential hypertension  I10     6. Hypertensive retinopathy of both eyes  H35.033     7. Combined forms of age-related cataract of left eye  H25.812     8. Pseudophakia  Z96.1      1,2. Rhegmatogenous  retinal detachment, OD - bullous superior mac off detachment, onset of foveal involvement Thurday, 09.23.21 by pt history - detached temporally from 0730 to 11 oclock, fovea off, horseshoe tear at 1030 - s/p pneumatic retinopexy w/ C3F8 OD 09.24.21 - supplemental intravitreal C3F8 gas injection OD (2.5cc on 10.06.21) **persistent shallow SRF remained due to ERM preventing full reattachment**  - s/p PPV/TissueBlue stain/MP/EL/FAX/14% C3F8 OD, 10.14.2021  - exam and OCT show trace, focal SRF nasal macula -- all other pockets of SRF resolved  - now BCVA 20/25!             - IOP good at 14 - f/u 6-9 months, POV, DFE, OCT   3. Epiretinal membrane, right eye  - +ERM w/ pucker likely impacting RD and SRF OD  - s/p PPV/TissueBlue stain/MP/EL/FAX/14% C3F8 OD, 10.14.2021 as above - ERM gone - OCT shows ERM gone and retinal thickening improved - monitor  4. Retinal tear, OS - horse shoe tear located at 0130 - s/p laser retinopexy OS 09.24.21 - good laser changes in place - no new RT/RD  5,6. Hypertensive retinopathy OU - discussed importance of tight BP control - monitor  7. Mixed Cataract OS - The symptoms of cataract, surgical options, and treatments and risks were discussed with patient. - discussed diagnosis and progression   8. Pseudophakia OD  - s/p CE/IOL OD (Dr. Brigitte Pulse, 02.07.22)  - IOL in good position, doing well - monitor  Ophthalmic Meds Ordered this visit:  No orders of the defined types were placed in this encounter.     Return for f/u 6-9 months, RD OD, DFE, OCT.  There are no Patient Instructions on file for this visit.  This document serves as a record of services personally performed by Gardiner Sleeper, MD, PhD. It was created on their behalf by Leeann Must, [place], an ophthalmic technician. The creation of this record is the provider's dictation and/or activities during the visit.    Electronically signed by: Leeann Must, COA _0 @ 1:05 PM  Gardiner Sleeper, M.D., Ph.D. Diseases & Surgery of the Retina and [place] 05/08/2021   I have reviewed the above documentation for accuracy and completeness, and I agree with the above. Gardiner Sleeper, M.D., Ph.D. 05/09/21 1:05 PM  Abbreviations: M myopia (nearsighted); A astigmatism; H hyperopia (farsighted); P presbyopia; Mrx spectacle prescription;  CTL contact lenses; OD right eye; OS left eye; OU both eyes  XT exotropia; ET esotropia; PEK punctate epithelial keratitis; PEE punctate epithelial erosions; DES dry eye syndrome; MGD meibomian gland dysfunction; ATs artificial tears; PFAT's preservative free artificial tears; [place] nuclear sclerotic cataract; PSC posterior subcapsular cataract; ERM epi-retinal membrane; PVD posterior vitreous detachment; RD retinal detachment; DM diabetes mellitus; DR diabetic retinopathy; NPDR non-proliferative diabetic retinopathy; PDR proliferative diabetic retinopathy; CSME clinically significant macular edema; DME diabetic macular edema; dbh dot blot hemorrhages; CWS cotton wool spot; POAG primary open angle glaucoma; C/D cup-to-disc ratio; HVF humphrey visual field; GVF goldmann visual field; OCT optical coherence  tomography; IOP intraocular pressure; BRVO Branch retinal vein occlusion; CRVO central retinal vein occlusion; CRAO central retinal artery occlusion; BRAO branch retinal artery occlusion; RT retinal tear; SB scleral buckle; PPV pars plana vitrectomy; VH Vitreous hemorrhage; PRP panretinal laser photocoagulation; IVK intravitreal kenalog; VMT vitreomacular traction; MH Macular hole;  NVD neovascularization of the disc; NVE neovascularization elsewhere; AREDS age related eye disease study; ARMD age related macular degeneration; POAG primary open angle glaucoma; EBMD epithelial/anterior basement membrane dystrophy; ACIOL anterior chamber intraocular lens; IOL intraocular lens; PCIOL posterior chamber intraocular lens; Phaco/IOL  phacoemulsification with intraocular lens placement; [place] photorefractive keratectomy; LASIK laser assisted in situ keratomileusis; HTN hypertension; DM diabetes mellitus; COPD chronic obstructive pulmonary disease

## 2021-05-08 ENCOUNTER — Ambulatory Visit (INDEPENDENT_AMBULATORY_CARE_PROVIDER_SITE_OTHER): Payer: PPO | Admitting: Ophthalmology

## 2021-05-08 ENCOUNTER — Other Ambulatory Visit: Payer: Self-pay

## 2021-05-08 ENCOUNTER — Encounter (INDEPENDENT_AMBULATORY_CARE_PROVIDER_SITE_OTHER): Payer: Self-pay | Admitting: Ophthalmology

## 2021-05-08 DIAGNOSIS — H35033 Hypertensive retinopathy, bilateral: Secondary | ICD-10-CM

## 2021-05-08 DIAGNOSIS — H33312 Horseshoe tear of retina without detachment, left eye: Secondary | ICD-10-CM | POA: Diagnosis not present

## 2021-05-08 DIAGNOSIS — H3321 Serous retinal detachment, right eye: Secondary | ICD-10-CM | POA: Diagnosis not present

## 2021-05-08 DIAGNOSIS — H25812 Combined forms of age-related cataract, left eye: Secondary | ICD-10-CM | POA: Diagnosis not present

## 2021-05-08 DIAGNOSIS — H35371 Puckering of macula, right eye: Secondary | ICD-10-CM

## 2021-05-08 DIAGNOSIS — Z961 Presence of intraocular lens: Secondary | ICD-10-CM | POA: Diagnosis not present

## 2021-05-08 DIAGNOSIS — H3581 Retinal edema: Secondary | ICD-10-CM

## 2021-05-08 DIAGNOSIS — I10 Essential (primary) hypertension: Secondary | ICD-10-CM | POA: Diagnosis not present

## 2021-05-08 DIAGNOSIS — H25813 Combined forms of age-related cataract, bilateral: Secondary | ICD-10-CM

## 2021-05-08 NOTE — Progress Notes (Addendum)
Cardiology Office Note:    Date:  05/09/2021   ID:  Todd Weeks, DOB [birth year], MRN [id number]  PCP:  Todd Barrack, MD   Todd Weeks Providers Cardiologist:  Todd Chandler, MD     Referring MD: Todd Barrack, MD   Chief Complaint:  F/u for CAD, after cath; surgical clearance    Patient Profile:   Todd Weeks is a 65 y.o. male with:  Coronary artery disease  Cath in 2012: mild dz in RCA; med Rx Cath 8/22: mild to mod non-obs dz in RCA; med Rx  Hypertension  Hyperlipidemia  Peripheral arterial disease  S/p R SFA stent in 2012 and 5/22 +Cigs Depression   Prior CV studies: LEFT HEART CATH AND CORONARY ANGIOGRAPHY 04/05/2021 Narrative   Prox RCA lesion is 40% stenosed.   Mid RCA lesion is 40% stenosed.   Dist RCA lesion is 30% stenosed.   The left ventricular systolic function is normal.   LV end diastolic pressure is normal.   The left ventricular ejection fraction is 55-65% by visual estimate.   There is no mitral valve regurgitation. No disease in the LAD or Circumflex Mild to moderate proximal and mid RCA stenosis. This does not appear to be flow limiting. Normal LV systolic function. Recommendations: Continue medical management of CAD.   GATED SPECT MYO PERF W/LEXISCAN STRESS 1D 05/12/2018 Narrative  Nuclear stress EF: 53%.  No Weeks wave inversion was noted during stress.  There was no ST segment deviation noted during stress.  This is a low risk study. No reversible ischemia. LVEF 53% with normal wall motion. This is a low risk study. Compared to a prior study in 2012, the LVEF is higher.   ECHOCARDIOGRAM 04/26/18 EF 55-60, no RWMA, trivial MR, mild LAE, normal RVSF, mild RAE, trivial TR  History of Present Illness: Todd Weeks was seen by Todd Weeks in 7/22 after an admission with chest pain.  He was set up for coronary CTA.  This demonstrated mod to severe RCA stenosis.  Cardiac catheterization was arranged and demonstrated just mild to mod  non-obstructive dz in the RCA. Med Rx was recommended.  He returns for surgical clearance.  He needs hernia surgery with Dr. Marlou Weeks under gen anesthesia.  He is here alone.  He is doing well without chest pain, shortness of breath, syncope, leg edema.  The request from his surgeon is to hold ASA for his surgery.  Of note, he is on ASA and Plavix at the direction of vascular surgery b/c of his recent stenting procedure in 5/22.  Records from [facility] were reviewed and the recommendation is to continue ASA and Plavix indefinitely.      Past Medical History:  Diagnosis Date   Bell's palsy    Cataract    Mixed Weeks OU   Closed nondisplaced fracture of neck of third metacarpal bone of right hand 02/04/2017   Clotting disorder ([place])    Depression    Diverticulitis    GERD (gastroesophageal reflux disease)    Headache(784.0)    HTN (hypertension)    Hyperlipidemia    Hypertensive retinopathy    OU   Mild CAD 2012   PVD (peripheral vascular disease) ([place])    Retinal detachment    Sexual dysfunction    Current Medications: Current Meds  Medication Sig   ALPRAZolam (XANAX) 0.25 MG tablet Take 1 tablet (0.25 mg total) by mouth 2 (two) times daily as needed for anxiety.   aspirin EC 81 MG  tablet Take 1 tablet (81 mg total) by mouth daily.   atorvastatin (LIPITOR) 40 MG tablet TAKE 1 TABLET BY MOUTH ONCE A DAY.   clopidogrel (PLAVIX) 75 MG tablet TAKE (1) TABLET BY MOUTH ONCE DAILY.   diclofenac Sodium (VOLTAREN) 1 % GEL Apply 1 application topically 3 (three) times daily as needed (leg cramps/spasms).   famotidine (PEPCID) 20 MG tablet TAKE 1 TABLET BY MOUTH ONCE DAILY.   FLUoxetine (PROZAC) 40 MG capsule Take 1 capsule (40 mg total) by mouth daily.   irbesartan (AVAPRO) 300 MG tablet TAKE 1 TABLET BY MOUTH ONCE A DAY.   nitroGLYCERIN (NITROSTAT) 0.4 MG SL tablet Place 0.4 mg under the tongue every 5 (five) minutes as needed for chest pain.   tamsulosin (FLOMAX) 0.4 MG CAPS capsule Take 1 capsule (0.4  mg total) by mouth daily.   Current Facility-Administered Medications for the 05/09/21 encounter (Office Visit) with Todd Dopp T, PA-C  Medication   sodium chloride flush (NS) 0.9 % injection 3 mL    Allergies:   Patient has no known allergies.   Social History   Tobacco Use   Smoking status: Former    Packs/day: 1.50    Years: 46.00    Pack years: 69.00    Types: Cigarettes    Quit date: 12/12/2010    Years since quitting: 10.4   Smokeless tobacco: Never  Vaping Use   Vaping Use: Never used  Substance Use Topics   Alcohol use: Yes    Alcohol/week: 4.0 standard drinks    Types: 4 Cans of beer per week    Comment: a couple of beer on a weekend   Drug use: Not Currently    Types: Cocaine, Marijuana, "Crack" cocaine    Comment: over 25 years ago    Family Hx: The patient's family history includes Asthma in his son; Deep vein thrombosis in his father; Diabetes in his brother; Emphysema in his maternal grandfather; Hypertension in his mother; Lung disease in his brother; Tuberculosis in his maternal grandfather. There is no history of Rectal cancer or Colon cancer.  Review of Systems  Cardiovascular:  Negative for claudication.    EKGs/Labs/Other Test Reviewed:    EKG:  EKG is   ordered today.  The ekg ordered today demonstrates NSR, HR 71, left bundle branch block, QTC 480  Recent Labs: 03/30/2021: ALT 29; BUN 17; Creatinine, Ser 0.67; Hemoglobin 14.0; Platelets 213.0; Potassium 4.6; Sodium 137   Recent Lipid Panel Lab Results  Component Value Date/Time   CHOL 166 03/23/2019 09:46 AM   CHOL 137 06/11/2014 12:00 AM   TRIG 140.0 03/23/2019 09:46 AM   TRIG 173 06/11/2014 12:00 AM   HDL 51.50 03/23/2019 09:46 AM   LDLCALC 86 03/23/2019 09:46 AM     Risk Assessment/Calculations:          Physical Exam:    VS:  BP 132/76   Pulse 70   Ht 6' (1.829 m)   Wt 259 lb 9.6 oz (117.8 kg)   SpO2 97%   BMI 35.21 kg/m     Wt Readings from Last 3 Encounters:  05/09/21  259 lb 9.6 oz (117.8 kg)  04/24/21 257 lb 9.6 oz (116.8 kg)  04/05/21 265 lb (120.2 kg)    Constitutional:      Appearance: Healthy appearance. Not in distress.  Neck:     Vascular: JVD normal.  Pulmonary:     Effort: Pulmonary effort is normal.     Breath sounds: No wheezing.  No rales.  Cardiovascular:     Normal rate. Regular rhythm. Normal S1. Normal S2.      Murmurs: There is no murmur.     Comments: Right wrist without hematoma Edema:    Peripheral edema absent.  Abdominal:     Palpations: Abdomen is soft.  Skin:    General: Skin is warm and dry.  Neurological:     General: No focal deficit present.     Mental Status: Alert and oriented to person, place and time.     Cranial Nerves: Cranial nerves are intact.       ASSESSMENT & PLAN:   1. Coronary artery disease involving native coronary artery of native heart without angina pectoris He has a history of nonobstructive coronary artery disease.  Most recent cardiac catheterization demonstrated mild to moderate disease in the RCA and no disease in the LAD or LCx.  He is doing well without anginal symptoms.  Continue aspirin 81 mg daily, atorvastatin 40 mg daily.  Follow-up with Todd Weeks in 1 year.  2. PAD (peripheral artery disease) ([place]) Managed by vascular surgery.  He did have stenting to the right SFA in May 2022.  He is on indefinite dual antiplatelet therapy.  Follow-up with vascular surgery as planned.  3. Essential hypertension Blood pressure is controlled.  Continue irbesartan 300 mg daily.  4. Hyperlipidemia LDL goal <70 Continue atorvastatin 40 mg daily.  5. Preoperative cardiovascular examination His perioperative risk of major cardiac event is low at 0.4% according to the revised cardiac risk index.  Given the results of his recent cardiac catheterization, he does not need further cardiac work-up.  He is able to proceed with his hernia surgery at acceptable risk.  From a cardiac standpoint he can hold  aspirin for 7 days prior to surgery and resume postop when felt to be safe.  However, he is on aspirin and Plavix at the direction of vascular surgery given his recent peripheral vascular procedure.  As far as his aspirin and Plavix goes, recommendations should also be obtained from the vascular surgeon as to whether or not 1 or both of these can be held for his upcoming surgery.  I will also forward my note to his vascular surgeon for their input.        Dispo:  Return in about 1 year (around 05/09/2022) for Routine Follow Up w/ Todd Weeks.   Medication Adjustments/Labs and Tests Ordered: Current medicines are reviewed at length with the patient today.  Concerns regarding medicines are outlined above.  Tests Ordered: Orders Placed This Encounter  Procedures   EKG 12-Lead   Medication Changes: No orders of the defined types were placed in this encounter.  Signed, Todd Dopp, PA-C  05/09/2021 5:19 PM    [facility] [address], [place], [place]  [address] Phone: [phone]; Fax: [phone]

## 2021-05-09 ENCOUNTER — Encounter (INDEPENDENT_AMBULATORY_CARE_PROVIDER_SITE_OTHER): Payer: Self-pay | Admitting: Ophthalmology

## 2021-05-09 ENCOUNTER — Ambulatory Visit: Payer: PPO | Admitting: Physician Assistant

## 2021-05-09 ENCOUNTER — Encounter: Payer: Self-pay | Admitting: Physician Assistant

## 2021-05-09 VITALS — BP 132/76 | HR 70 | Ht 72.0 in | Wt 259.6 lb

## 2021-05-09 DIAGNOSIS — I10 Essential (primary) hypertension: Secondary | ICD-10-CM

## 2021-05-09 DIAGNOSIS — E785 Hyperlipidemia, unspecified: Secondary | ICD-10-CM

## 2021-05-09 DIAGNOSIS — I2510 Atherosclerotic heart disease of native coronary artery without angina pectoris: Secondary | ICD-10-CM | POA: Diagnosis not present

## 2021-05-09 DIAGNOSIS — Z01810 Encounter for preprocedural cardiovascular examination: Secondary | ICD-10-CM

## 2021-05-09 DIAGNOSIS — I739 Peripheral vascular disease, unspecified: Secondary | ICD-10-CM | POA: Diagnosis not present

## 2021-05-09 NOTE — Patient Instructions (Signed)
Medication Instructions:  Your physician recommends that you continue on your current medications as directed. Please refer to the Current Medication list given to you today.  *If you need a refill on your cardiac medications before your next appointment, please call your pharmacy*   Lab Work: None ordered  If you have labs (blood work) drawn today and your tests are completely normal, you will receive your results only by: [place] (if you have MyChart) OR A paper copy in the mail If you have any lab test that is abnormal or we need to change your treatment, we will call you to review the results.   Testing/Procedures: None ordered   Follow-Up: At [facility], you and your health needs are our priority.  As part of our continuing mission to provide you with exceptional heart care, we have created designated Provider Care Teams.  These Care Teams include your primary Cardiologist (physician) and Advanced Practice Providers (APPs -  Physician Assistants and Nurse Practitioners) who all work together to provide you with the care you need, when you need it.  We recommend signing up for the patient portal called "MyChart".  Sign up information is provided on this After Visit Summary.  MyChart is used to connect with patients for Virtual Visits (Telemedicine).  Patients are able to view lab/test results, encounter notes, upcoming appointments, etc.  Non-urgent messages can be sent to your provider as well.   To learn more about what you can do with MyChart, go to [email or web address].    Your next appointment:   12 month(s)  The format for your next appointment:   In Person  Provider:   [name], MD   Other Instructions

## 2021-05-09 NOTE — Telephone Encounter (Signed)
Lmtcb for pt.  

## 2021-05-09 NOTE — Addendum Note (Signed)
Addended by: Elby Beck R on: 05/09/2021 02:22 PM   Modules accepted: Orders

## 2021-05-09 NOTE — Telephone Encounter (Signed)
Spoke with patient to let him know recs from Dr. Erin Fulling. Patient said that he was fine with getting the [facility] machine. Advised him that I would let DME know and they would reach out to him. Patient expressed understanding. Called and spoke with Melissa from [place] to let her know she said that she can do a verbal order now and then to just addend the order for it to say [facility] machine is ok for patient. Order has been addended. Nothing further needed at this time.

## 2021-05-10 ENCOUNTER — Telehealth: Payer: Self-pay | Admitting: Physician Assistant

## 2021-05-10 ENCOUNTER — Telehealth: Payer: Self-pay

## 2021-05-10 NOTE — Telephone Encounter (Signed)
Follow Up:   Patient checking on the status of his clearance. Pt said [place] surgery said that still have not received his clearance.Todd Weeks

## 2021-05-10 NOTE — Telephone Encounter (Signed)
Spoke with [facility] Surgery office and advised per Arlee Muslim, PA-C that patient is ok to hold plavix and aspirin for procedure with Dr. Marlou Starks. Elmyra Ricks verbalized understanding.

## 2021-05-10 NOTE — Telephone Encounter (Signed)
   Resubmitted office note from Richardson Dopp, PA-C from 05/09/21 at which time patient was deemed acceptable risk for surgery without further work-up.   Abigail Butts, PA-C 05/10/21; 6:12 PM

## 2021-05-15 ENCOUNTER — Other Ambulatory Visit: Payer: Self-pay | Admitting: Family Medicine

## 2021-05-19 ENCOUNTER — Other Ambulatory Visit: Payer: Self-pay

## 2021-05-19 ENCOUNTER — Encounter (HOSPITAL_BASED_OUTPATIENT_CLINIC_OR_DEPARTMENT_OTHER): Payer: Self-pay | Admitting: General Surgery

## 2021-05-19 NOTE — Progress Notes (Signed)
Called [facility] to clarify specifics of Plavix and Asa hold for patient's surgery 05/29/21. Need to have specific number of days that is recommended for patient to hold these meds. Spoke with Clinton Sawyer. She is to contact provider to obtain this information and contact me once obtained.

## 2021-05-22 ENCOUNTER — Telehealth: Payer: Self-pay

## 2021-05-22 NOTE — Telephone Encounter (Addendum)
Spoke with Dr. Virl Cagey to clarify how Todd Weeks patient can hold Plavix for upcoming surgery. Per Dr. Virl Cagey, [facility] for patient to hold Plavix for 5 days prior to surgery and should be restarted as soon as medically reasonably safe postop per Dr. Marlou Starks.     ----- Message from Broadus John, MD sent at 05/20/2021 10:37 AM EDT ----- Regarding: RE: Please advise If feasible, the case should be done on single antiplatelet agent.  Plavix should be restarted as soon as medically reasonable postop.  This is at the discretion of Dr. Marlou Starks.  ----- Message ----- From: Nicholas Lose, RN Sent: 05/19/2021   3:47 PM EDT To: Broadus John, MD Subject: Please advise                                  ER,   Pt is scheduled for a right inguinal hernia repair with Dr. Marlou Starks on 05/29/21. Matt E. had advised its ok to hold plavix and aspirin for the procedure, but office needs a more specific recommendation on how Todd Weeks pt can hold Plavix. Please advise.   Thanks,  [place]

## 2021-05-22 NOTE — Progress Notes (Signed)
      Enhanced Recovery after Surgery for Orthopedics Enhanced Recovery after Surgery is a protocol used to improve the stress on your body and your recovery after surgery.  Patient Instructions  The night before surgery:  No food after midnight. ONLY clear liquids after midnight  The day of surgery (if you do NOT have diabetes):  Drink ONE (1) Pre-Surgery Clear Ensure as directed.   This drink was given to you during your hospital  pre-op appointment visit. The pre-op nurse will instruct you on the time to drink the  Pre-Surgery Ensure depending on your surgery time. Finish the drink at the designated time by the pre-op nurse.  Nothing else to drink after completing the  Pre-Surgery Clear Ensure.  The day of surgery (if you have diabetes): Drink ONE (1) Gatorade 2 (G2) as directed. This drink was given to you during your hospital  pre-op appointment visit.  The pre-op nurse will instruct you on the time to drink the   Gatorade 2 (G2) depending on your surgery time. Color of the Gatorade may vary. Red is not allowed. Nothing else to drink after completing the  Gatorade 2 (G2).         If you have questions, please contact your surgeon's office.  Surgical soap given to the pt, RN explained how to use, plus written instructions given.  Pt verbalized understandings.

## 2021-05-22 NOTE — Progress Notes (Signed)
Called to confirm that the pt knew it was OK for him to stop his Plavix % days before surgery. Pt stated his understanding.

## 2021-05-29 ENCOUNTER — Encounter (HOSPITAL_BASED_OUTPATIENT_CLINIC_OR_DEPARTMENT_OTHER): Payer: Self-pay | Admitting: General Surgery

## 2021-05-29 ENCOUNTER — Encounter (HOSPITAL_BASED_OUTPATIENT_CLINIC_OR_DEPARTMENT_OTHER)
Admission: RE | Disposition: A | Discharge status: Home / Self Care | Payer: Self-pay | Source: Home / Self Care | Attending: General Surgery

## 2021-05-29 ENCOUNTER — Ambulatory Visit (HOSPITAL_BASED_OUTPATIENT_CLINIC_OR_DEPARTMENT_OTHER): Payer: PPO | Admitting: Anesthesiology

## 2021-05-29 ENCOUNTER — Encounter: Payer: PPO | Admitting: Family Medicine

## 2021-05-29 ENCOUNTER — Other Ambulatory Visit: Payer: Self-pay

## 2021-05-29 ENCOUNTER — Ambulatory Visit (HOSPITAL_BASED_OUTPATIENT_CLINIC_OR_DEPARTMENT_OTHER)
Admission: RE | Admit: 2021-05-29 | Discharge: 2021-05-29 | Disposition: A | Discharge status: Home / Self Care | Payer: PPO | Attending: General Surgery | Admitting: General Surgery

## 2021-05-29 DIAGNOSIS — Z87891 Personal history of nicotine dependence: Secondary | ICD-10-CM | POA: Insufficient documentation

## 2021-05-29 DIAGNOSIS — K4090 Unilateral inguinal hernia, without obstruction or gangrene, not specified as recurrent: Secondary | ICD-10-CM | POA: Diagnosis not present

## 2021-05-29 DIAGNOSIS — Z95820 Peripheral vascular angioplasty status with implants and grafts: Secondary | ICD-10-CM | POA: Diagnosis not present

## 2021-05-29 DIAGNOSIS — Z7982 Long term (current) use of aspirin: Secondary | ICD-10-CM | POA: Insufficient documentation

## 2021-05-29 DIAGNOSIS — G8918 Other acute postprocedural pain: Secondary | ICD-10-CM | POA: Diagnosis not present

## 2021-05-29 DIAGNOSIS — Z7902 Long term (current) use of antithrombotics/antiplatelets: Secondary | ICD-10-CM | POA: Insufficient documentation

## 2021-05-29 DIAGNOSIS — E785 Hyperlipidemia, unspecified: Secondary | ICD-10-CM | POA: Diagnosis not present

## 2021-05-29 DIAGNOSIS — Z79899 Other long term (current) drug therapy: Secondary | ICD-10-CM | POA: Diagnosis not present

## 2021-05-29 DIAGNOSIS — I739 Peripheral vascular disease, unspecified: Secondary | ICD-10-CM | POA: Diagnosis not present

## 2021-05-29 DIAGNOSIS — G4730 Sleep apnea, unspecified: Secondary | ICD-10-CM | POA: Diagnosis not present

## 2021-05-29 HISTORY — DX: Sleep apnea, unspecified: G47.30

## 2021-05-29 HISTORY — PX: INGUINAL HERNIA REPAIR: SHX194

## 2021-05-29 SURGERY — REPAIR, HERNIA, INGUINAL, ADULT
Anesthesia: General | Site: Groin | Laterality: Right

## 2021-05-29 MED ORDER — LIDOCAINE HCL (CARDIAC) PF 100 MG/5ML IV SOSY
PREFILLED_SYRINGE | INTRAVENOUS | Status: DC | PRN
  Administered 2021-05-29: 100 mg via INTRAVENOUS

## 2021-05-29 MED ORDER — FENTANYL CITRATE (PF) 100 MCG/2ML IJ SOLN
100.0000 ug | Freq: Once | INTRAMUSCULAR | Status: AC
  Administered 2021-05-29: 100 ug via INTRAVENOUS

## 2021-05-29 MED ORDER — CELECOXIB 200 MG PO CAPS
ORAL_CAPSULE | ORAL | Status: AC
  Filled 2021-05-29: qty 1

## 2021-05-29 MED ORDER — MIDAZOLAM HCL 2 MG/2ML IJ SOLN
INTRAMUSCULAR | Status: AC
  Filled 2021-05-29: qty 2

## 2021-05-29 MED ORDER — CHLORHEXIDINE GLUCONATE CLOTH 2 % EX PADS: 6.0000 | MEDICATED_PAD | Freq: Once | CUTANEOUS | Status: DC

## 2021-05-29 MED ORDER — CELECOXIB 200 MG PO CAPS
200.0000 mg | ORAL_CAPSULE | ORAL | Status: AC
  Administered 2021-05-29: 200 mg via ORAL

## 2021-05-29 MED ORDER — HYDROMORPHONE HCL 1 MG/ML IJ SOLN
INTRAMUSCULAR | Status: AC
  Filled 2021-05-29: qty 0.5

## 2021-05-29 MED ORDER — ONDANSETRON HCL 4 MG/2ML IJ SOLN
INTRAMUSCULAR | Status: DC | PRN
  Administered 2021-05-29: 4 mg via INTRAVENOUS

## 2021-05-29 MED ORDER — MIDAZOLAM HCL 2 MG/2ML IJ SOLN
2.0000 mg | Freq: Once | INTRAMUSCULAR | Status: AC
  Administered 2021-05-29: 2 mg via INTRAVENOUS

## 2021-05-29 MED ORDER — OXYCODONE HCL 5 MG PO TABS
5.0000 mg | ORAL_TABLET | Freq: Once | ORAL | Status: AC | PRN
  Administered 2021-05-29: 5 mg via ORAL

## 2021-05-29 MED ORDER — PROPOFOL 10 MG/ML IV BOLUS
INTRAVENOUS | Status: AC
  Filled 2021-05-29: qty 20

## 2021-05-29 MED ORDER — PROPOFOL 10 MG/ML IV BOLUS
INTRAVENOUS | Status: DC | PRN
  Administered 2021-05-29: 150 mg via INTRAVENOUS

## 2021-05-29 MED ORDER — HYDROCODONE-ACETAMINOPHEN 5-325 MG PO TABS: 1.0000 | ORAL_TABLET | Freq: Four times a day (QID) | ORAL | 0 refills | Status: DC | PRN

## 2021-05-29 MED ORDER — GABAPENTIN 300 MG PO CAPS
ORAL_CAPSULE | ORAL | Status: AC
  Filled 2021-05-29: qty 1

## 2021-05-29 MED ORDER — ACETAMINOPHEN 500 MG PO TABS
ORAL_TABLET | ORAL | Status: AC
  Filled 2021-05-29: qty 2

## 2021-05-29 MED ORDER — MEPERIDINE HCL 25 MG/ML IJ SOLN: 6.2500 mg | INTRAMUSCULAR | Status: DC | PRN

## 2021-05-29 MED ORDER — ROPIVACAINE HCL 5 MG/ML IJ SOLN
INTRAMUSCULAR | Status: DC | PRN
  Administered 2021-05-29: 25 mL via PERINEURAL

## 2021-05-29 MED ORDER — OXYCODONE HCL 5 MG PO TABS
ORAL_TABLET | ORAL | Status: AC
  Filled 2021-05-29: qty 1

## 2021-05-29 MED ORDER — EPHEDRINE 5 MG/ML INJ
INTRAVENOUS | Status: AC
  Filled 2021-05-29: qty 5

## 2021-05-29 MED ORDER — BUPIVACAINE-EPINEPHRINE (PF) 0.25% -1:200000 IJ SOLN
INTRAMUSCULAR | Status: AC
  Filled 2021-05-29: qty 30

## 2021-05-29 MED ORDER — ROCURONIUM BROMIDE 100 MG/10ML IV SOLN
INTRAVENOUS | Status: DC | PRN
  Administered 2021-05-29: 100 mg via INTRAVENOUS

## 2021-05-29 MED ORDER — GABAPENTIN 300 MG PO CAPS
300.0000 mg | ORAL_CAPSULE | ORAL | Status: AC
Start: 2021-05-29 — End: 2021-05-29
  Administered 2021-05-29: 300 mg via ORAL

## 2021-05-29 MED ORDER — FENTANYL CITRATE (PF) 100 MCG/2ML IJ SOLN
INTRAMUSCULAR | Status: DC | PRN
  Administered 2021-05-29: 50 ug via INTRAVENOUS

## 2021-05-29 MED ORDER — HYDROMORPHONE HCL 1 MG/ML IJ SOLN
0.2500 mg | INTRAMUSCULAR | Status: DC | PRN
  Administered 2021-05-29 (×3): 0.5 mg via INTRAVENOUS

## 2021-05-29 MED ORDER — OXYCODONE HCL 5 MG/5ML PO SOLN: 5.0000 mg | Freq: Once | ORAL | Status: AC | PRN

## 2021-05-29 MED ORDER — CEFAZOLIN SODIUM-DEXTROSE 2-4 GM/100ML-% IV SOLN
2.0000 g | INTRAVENOUS | Status: AC
  Administered 2021-05-29: 2 g via INTRAVENOUS

## 2021-05-29 MED ORDER — FENTANYL CITRATE (PF) 100 MCG/2ML IJ SOLN
INTRAMUSCULAR | Status: AC
  Filled 2021-05-29: qty 2

## 2021-05-29 MED ORDER — SUGAMMADEX SODIUM 200 MG/2ML IV SOLN
INTRAVENOUS | Status: DC | PRN
  Administered 2021-05-29: 200 mg via INTRAVENOUS

## 2021-05-29 MED ORDER — PROMETHAZINE HCL 25 MG/ML IJ SOLN: 6.2500 mg | INTRAMUSCULAR | Status: DC | PRN

## 2021-05-29 MED ORDER — AMISULPRIDE (ANTIEMETIC) 5 MG/2ML IV SOLN: 10.0000 mg | Freq: Once | INTRAVENOUS | Status: DC | PRN

## 2021-05-29 MED ORDER — DEXMEDETOMIDINE (PRECEDEX) IN NS 20 MCG/5ML (4 MCG/ML) IV SYRINGE
PREFILLED_SYRINGE | INTRAVENOUS | Status: DC | PRN
  Administered 2021-05-29: 4 ug via INTRAVENOUS

## 2021-05-29 MED ORDER — ROCURONIUM BROMIDE 10 MG/ML (PF) SYRINGE
PREFILLED_SYRINGE | INTRAVENOUS | Status: AC
  Filled 2021-05-29: qty 10

## 2021-05-29 MED ORDER — CEFAZOLIN SODIUM-DEXTROSE 2-4 GM/100ML-% IV SOLN
INTRAVENOUS | Status: AC
  Filled 2021-05-29: qty 100

## 2021-05-29 MED ORDER — ACETAMINOPHEN 500 MG PO TABS
1000.0000 mg | ORAL_TABLET | ORAL | Status: AC
  Administered 2021-05-29: 1000 mg via ORAL

## 2021-05-29 MED ORDER — EPHEDRINE SULFATE 50 MG/ML IJ SOLN
INTRAMUSCULAR | Status: DC | PRN
  Administered 2021-05-29: 10 mg via INTRAVENOUS

## 2021-05-29 MED ORDER — LACTATED RINGERS IV SOLN: INTRAVENOUS | Status: DC

## 2021-05-29 MED ORDER — BUPIVACAINE-EPINEPHRINE 0.25% -1:200000 IJ SOLN
INTRAMUSCULAR | Status: DC | PRN
  Administered 2021-05-29: 17 mL

## 2021-05-29 MED ORDER — DEXAMETHASONE SODIUM PHOSPHATE 4 MG/ML IJ SOLN
INTRAMUSCULAR | Status: DC | PRN
  Administered 2021-05-29: 10 mg via INTRAVENOUS

## 2021-05-29 SURGICAL SUPPLY — 47 items
ADH SKN CLS APL DERMABOND .7 (GAUZE/BANDAGES/DRESSINGS) ×1
APL PRP STRL LF DISP 70% ISPRP (MISCELLANEOUS) ×1
APL SKNCLS STERI-STRIP NONHPOA (GAUZE/BANDAGES/DRESSINGS)
BENZOIN TINCTURE PRP APPL 2/3 (GAUZE/BANDAGES/DRESSINGS) IMPLANT
BLADE CLIPPER SURG (BLADE) ×2 IMPLANT
BLADE HEX COATED 2.75 (ELECTRODE) IMPLANT
BLADE SURG 15 STRL LF DISP TIS (BLADE) ×1 IMPLANT
BLADE SURG 15 STRL SS (BLADE) ×2
CHLORAPREP W/TINT 26 (MISCELLANEOUS) ×2 IMPLANT
COVER BACK TABLE 60X90IN (DRAPES) ×2 IMPLANT
COVER MAYO STAND STRL (DRAPES) ×2 IMPLANT
DECANTER SPIKE VIAL GLASS SM (MISCELLANEOUS) IMPLANT
DERMABOND ADVANCED (GAUZE/BANDAGES/DRESSINGS) ×1
DERMABOND ADVANCED .7 DNX12 (GAUZE/BANDAGES/DRESSINGS) ×1 IMPLANT
DRAIN PENROSE 1/2X12 LTX STRL (WOUND CARE) ×2 IMPLANT
DRAPE LAPAROTOMY TRNSV 102X78 (DRAPES) ×2 IMPLANT
DRAPE UTILITY XL STRL (DRAPES) ×2 IMPLANT
ELECT REM PT RETURN 9FT ADLT (ELECTROSURGICAL) ×2
ELECTRODE REM PT RTRN 9FT ADLT (ELECTROSURGICAL) ×1 IMPLANT
GLOVE SURG ENC MOIS LTX SZ7.5 (GLOVE) ×2 IMPLANT
GLOVE SURG POLYISO LF SZ6.5 (GLOVE) ×2 IMPLANT
GLOVE SURG UNDER POLY LF SZ7 (GLOVE) ×2 IMPLANT
GOWN STRL REUS W/ TWL LRG LVL3 (GOWN DISPOSABLE) ×2 IMPLANT
GOWN STRL REUS W/TWL LRG LVL3 (GOWN DISPOSABLE) ×4
MESH ULTRAPRO 3X6 7.6X15CM (Mesh General) ×2 IMPLANT
NEEDLE HYPO 25X1 1.5 SAFETY (NEEDLE) ×2 IMPLANT
NS IRRIG 1000ML POUR BTL (IV SOLUTION) ×2 IMPLANT
PACK BASIN DAY SURGERY FS (CUSTOM PROCEDURE TRAY) ×2 IMPLANT
PENCIL SMOKE EVACUATOR (MISCELLANEOUS) ×2 IMPLANT
SLEEVE SCD COMPRESS KNEE MED (STOCKING) ×2 IMPLANT
SPONGE T-LAP 18X18 ~~LOC~~+RFID (SPONGE) ×2 IMPLANT
STRIP CLOSURE SKIN 1/2X4 (GAUZE/BANDAGES/DRESSINGS) IMPLANT
SUT MON AB 4-0 PC3 18 (SUTURE) ×2 IMPLANT
SUT PROLENE 2 0 SH DA (SUTURE) ×4 IMPLANT
SUT SILK 2 0 SH (SUTURE) IMPLANT
SUT SILK 3 0 SH 30 (SUTURE) IMPLANT
SUT SILK 3 0 TIES 17X18 (SUTURE) ×2
SUT SILK 3-0 18XBRD TIE BLK (SUTURE) ×1 IMPLANT
SUT VIC AB 0 CT1 27 (SUTURE) ×2
SUT VIC AB 0 CT1 27XBRD ANBCTR (SUTURE) ×1 IMPLANT
SUT VIC AB 2-0 SH 27 (SUTURE) ×2
SUT VIC AB 2-0 SH 27XBRD (SUTURE) ×1 IMPLANT
SUT VIC AB 3-0 SH 27 (SUTURE) ×2
SUT VIC AB 3-0 SH 27X BRD (SUTURE) ×1 IMPLANT
SUT VICRYL 0 SH 27 (SUTURE) IMPLANT
SYR CONTROL 10ML LL (SYRINGE) ×2 IMPLANT
TOWEL GREEN STERILE FF (TOWEL DISPOSABLE) ×2 IMPLANT

## 2021-05-29 NOTE — Anesthesia Procedure Notes (Signed)
Procedure Name: Intubation Date/Time: 05/29/2021 1:22 PM Performed by: Ezequiel Kayser, CRNA Pre-anesthesia Checklist: Patient identified, Emergency Drugs available, Suction available and Patient being monitored Patient Re-evaluated:Patient Re-evaluated prior to induction Oxygen Delivery Method: Circle System Utilized Preoxygenation: Pre-oxygenation with 100% oxygen Induction Type: IV induction Ventilation: Two handed mask ventilation required, Mask ventilation without difficulty and Oral airway inserted - appropriate to patient size Laryngoscope Size: Mac and 4 Grade View: Grade II Tube type: Oral Tube size: 7.0 mm Number of attempts: 1 Airway Equipment and Method: Stylet and Oral airway Placement Confirmation: ETT inserted through vocal cords under direct vision, positive ETCO2 and breath sounds checked- equal and bilateral Tube secured with: Tape Dental Injury: Teeth and Oropharynx as per pre-operative assessment  Comments: Multiple teeth noted to as chipped during exam prior to laryngoscopy. Dentition unchanged after intubation

## 2021-05-29 NOTE — H&P (Signed)
REFERRING PHYSICIAN: Asencion Partridge, *  PROVIDER: Landry Corporal, MD  MRN: [id number] DOB: May 26, 1956  Subjective   Chief Complaint: No chief complaint on file.   History of Present Illness: Todd Weeks is a 65 y.o. male who is seen today as an office consultation at the request of Dr. Morene Rankins for evaluation of No chief complaint on file. .   We are asked to see the patient in consultation by Dr. Dimas Chyle to evaluate him for a right inguinal hernia. The patient is a 65 year old white male who has been experiencing intermittent right groin pain for the last year or so. He has never noticed a bulge. He denies any nausea or vomiting. He has been wearing a compression belt to help with the pain. He did undergo a CT scan recently that confirmed a right inguinal hernia. He does have some peripheral vascular disease with stents in both legs and he is on Plavix.  Review of Systems: A complete review of systems was obtained from the patient. I have reviewed this information and discussed as appropriate with the patient. See HPI as well for other ROS.  ROS   Medical History: Past Medical History:  Diagnosis Date   COPD (chronic obstructive pulmonary disease) (CMS-HCC)   Sleep apnea   Patient Active Problem List  Diagnosis   Non-recurrent unilateral inguinal hernia without obstruction or gangrene   History reviewed. No pertinent surgical history.   No Known Allergies  Current Outpatient Medications on File Prior to Visit  Medication Sig Dispense Refill   ALPRAZolam (XANAX) 0.25 MG tablet   aspirin 81 MG EC tablet Take by mouth   atorvastatin (LIPITOR) 40 MG tablet   clopidogreL (PLAVIX) 75 mg tablet   diclofenac (VOLTAREN) 1 % topical gel Apply topically   famotidine (PEPCID) 20 MG tablet   FLUoxetine (PROZAC) 40 MG capsule   irbesartan (AVAPRO) 300 MG tablet   tamsulosin (FLOMAX) 0.4 mg capsule   No current facility-administered medications on file prior to visit.    History reviewed. No pertinent family history.   Social History   Tobacco Use  Smoking Status Former Smoker   Quit date: 2012   Years since quitting: 10.7  Smokeless Tobacco Never Used    Social History   Socioeconomic History   Marital status: Single  Tobacco Use   Smoking status: Former Smoker  Quit date: 2012  Years since quitting: 10.7   Smokeless tobacco: Never Used  Substance and Sexual Activity   Alcohol use: Yes   Drug use: Never   Objective:   There were no vitals filed for this visit.  There is no height or weight on file to calculate BMI.  Physical Exam Constitutional:  General: He is not in acute distress. Appearance: Normal appearance.  HENT:  Head: Normocephalic and atraumatic.  Right Ear: External ear normal.  Left Ear: External ear normal.  Nose: Nose normal.  Mouth/Throat:  Mouth: Mucous membranes are moist.  Pharynx: Oropharynx is clear.  Eyes:  General: No scleral icterus. Extraocular Movements: Extraocular movements intact.  Conjunctiva/sclera: Conjunctivae normal.  Pupils: Pupils are equal, round, and reactive to light.  Cardiovascular:  Rate and Rhythm: Normal rate and regular rhythm.  Pulses: Normal pulses.  Heart sounds: Normal heart sounds.  Pulmonary:  Effort: Pulmonary effort is normal. No respiratory distress.  Breath sounds: Normal breath sounds.  Abdominal:  General: Abdomen is flat. Bowel sounds are normal. There is no distension.  Palpations: Abdomen is soft.  Tenderness: There is no  abdominal tenderness.  Genitourinary: Comments: There is a palpable reducible bulge in the right groin. There is no palpable bulge or impulse with straining in the left groin Musculoskeletal:  General: No swelling or deformity. Normal range of motion.  Cervical back: Normal range of motion and neck supple. No tenderness.  Skin: General: Skin is warm and dry.  Coloration: Skin is not jaundiced.  Neurological:  General: No focal deficit  present.  Mental Status: He is alert and oriented to person, place, and time.  Psychiatric:  Mood and Affect: Mood normal.  Behavior: Behavior normal.     Labs, Imaging and Diagnostic Testing:  Assessment and Plan:  Diagnoses and all orders for this visit:  Non-recurrent unilateral inguinal hernia without obstruction or gangrene    The patient appears to have a small but symptomatic right inguinal hernia. Because of the risk of incarceration and strangulation I feel he would benefit from having this fixed. He would also like to have this done. I have discussed with him in detail the risks and benefits of the operation to fix the hernia as well as some of the technical aspects including use of mesh and the risk of chronic pain and he understands and wishes to proceed. We will need clearance to have him off the Plavix prior to surgery.

## 2021-05-29 NOTE — Progress Notes (Signed)
Assisted Dr. Sabra Heck with right, ultrasound guided, transabdominal plane block. Side rails up, monitors on throughout procedure. See vital signs in flow sheet. Tolerated Procedure well.

## 2021-05-29 NOTE — Interval H&P Note (Signed)
History and Physical Interval Note:  05/29/2021 1:01 PM  Todd Weeks  has presented today for surgery, with the diagnosis of RIGHT INGUINAL HERNIA.  The various methods of treatment have been discussed with the patient and family. After consideration of risks, benefits and other options for treatment, the patient has consented to  Procedure(s) with comments: [place] (Right) - TAP BLOCK as a surgical intervention.  The patient's history has been reviewed, patient examined, no change in status, stable for surgery.  I have reviewed the patient's chart and labs.  Questions were answered to the patient's satisfaction.     [name]

## 2021-05-29 NOTE — Transfer of Care (Signed)
Immediate Anesthesia Transfer of Care Note  Patient: Todd Weeks  Procedure(s) Performed: RIGHT INGUINAL HERNIA REPAIR WITH MESH (Right: Groin)  Patient Location: PACU  Anesthesia Type:GA combined with regional for post-op pain  Level of Consciousness: awake, alert , oriented and patient cooperative  Airway & Oxygen Therapy: Patient Spontanous Breathing and Patient connected to face mask oxygen  Post-op Assessment: Report given to RN and Post -op Vital signs reviewed and stable  Post vital signs: Reviewed and stable  Last Vitals:  Vitals Value Taken Time  BP    Temp    Pulse 76 05/29/21 1501  Resp    SpO2 97 % 05/29/21 1501  Vitals shown include unvalidated device data.  Last Pain:  Vitals:   05/29/21 1203  TempSrc: Oral  PainSc: 0-No pain      Patients Stated Pain Goal: 0 (27/87/18 3672)  Complications: No notable events documented.

## 2021-05-29 NOTE — Op Note (Signed)
05/29/2021  2:54 PM  PATIENT:  Todd Weeks  65 y.o. male  PRE-OPERATIVE DIAGNOSIS:  RIGHT INGUINAL HERNIA  POST-OPERATIVE DIAGNOSIS:  RIGHT INGUINAL HERNIA  PROCEDURE:  Procedure(s) with comments: RIGHT INGUINAL HERNIA REPAIR WITH MESH (Right) - TAP BLOCK  SURGEON:  Surgeon(s) and Role:    * Jovita Kussmaul, MD - Primary  PHYSICIAN ASSISTANT:   ASSISTANTS: none   ANESTHESIA:   local and general  EBL:  10 mL   BLOOD ADMINISTERED:none  DRAINS: none   LOCAL MEDICATIONS USED:  MARCAINE     SPECIMEN:  No Specimen  DISPOSITION OF SPECIMEN:  N/A  COUNTS:  YES  TOURNIQUET:  * No tourniquets in log *  DICTATION: .Dragon Dictation  After informed consent was obtained the patient was brought to the operating room and placed in the supine position on the operating table.  After adequate induction of general anesthesia the patient's abdomen and right groin were prepped with ChloraPrep, allowed to dry, and draped in usual sterile manner.  An appropriate timeout was performed.  The right groin was then infiltrated with quarter percent Marcaine.  A small incision was made from the edge of the pubic tubercle on the right towards the anterior superior iliac spine.  The incision was carried through the skin and subcutaneous tissue sharply with the electrocautery until the fascia of the external oblique was encountered.  A small bridging vein was clamped with hemostats, divided, and ligated with 3-0 silk ties.  The patient's abdominal wall was very deep.  The fascia of the external oblique was then opened along its fibers towards the apex of the external ring with a 15 blade knife and Metzenbaum scissors.  A [place] retractor was deployed.  Blunt dissection was carried out of the cord structures until they could be surrounded between 2 fingers.  Half inch Penrose drain was placed around the cord structures for retraction purposes.  The hernia sac was identified and appeared to contain some of  the cecum and appendix.  It was not clear if the appendix was folded on itself or if there could have been a cystic mass associated with the appendix.  This will need to be reevaluated when he has recovered from the hernia surgery.  The sac with the cecum was readily reduced back into the abdominal cavity.  The floor of the canal was then repaired with interrupted 0 Vicryl stitches.  A 3 x 6 piece of ultra Pro mesh was then chosen and cut to the appropriate size.  The mesh was sewed inferiorly to the shelving edge of the inguinal ligament with a running 2-0 Prolene stitch.  Tails were cut in the mesh laterally.  The tails were wrapped around the cord structures.  Superiorly and laterally the mesh was sewed to the musculoaponeurotic strength 5 the transversalis with interrupted 2-0 Prolene vertical mattress stitches.  The tails of the mesh were anchored to the shelving edge of the inguinal ligament lateral to the cord with interrupted 2-0 Prolene stitches.  During the dissection the ilioinguinal nerve was not able to be located.  The vas deferens and testicular vessels were identified and spared.  The wound was irrigated with saline.  The mesh appeared to be in good position and the hernia well repaired.  The external bleak fascia was then reapproximated with a running 2-0 Vicryl stitch.  The subcutaneous fascia was then reapproximated with a running 3-0 Vicryl stitch.  The skin was then closed with a running 4-0 Monocryl subcuticular  stitch.  Dermabond dressings were applied.  The patient tolerated the procedure well.  At the end of the case all needle sponge and instrument counts were correct.  The patient was then awakened and taken to recovery in stable condition.  PLAN OF CARE: Discharge to home after PACU  PATIENT DISPOSITION:  PACU - hemodynamically stable.   Delay start of Pharmacological VTE agent (>24hrs) due to surgical blood loss or risk of bleeding: not applicable

## 2021-05-29 NOTE — Interval H&P Note (Signed)
History and Physical Interval Note:  05/29/2021 12:59 PM  Todd Weeks  has presented today for surgery, with the diagnosis of RIGHT INGUINAL HERNIA.  The various methods of treatment have been discussed with the patient and family. After consideration of risks, benefits and other options for treatment, the patient has consented to  Procedure(s) with comments: [place] (Right) - TAP BLOCK as a surgical intervention.  The patient's history has been reviewed, patient examined, no change in status, stable for surgery.  I have reviewed the patient's chart and labs.  Questions were answered to the patient's satisfaction.     [name]

## 2021-05-29 NOTE — Discharge Instructions (Signed)

## 2021-05-29 NOTE — Anesthesia Preprocedure Evaluation (Signed)
Anesthesia Evaluation  Patient identified by MRN, date of birth, ID band Patient awake    Reviewed: Allergy & Precautions, NPO status , Patient's Chart, lab work & pertinent test results  Airway Mallampati: II  TM Distance: >3 FB Neck ROM: Full    Dental  (+) Teeth Intact, Dental Advisory Given   Pulmonary sleep apnea , former smoker,    breath sounds clear to auscultation       Cardiovascular hypertension, Pt. on medications + CAD and + Peripheral Vascular Disease   Rhythm:Regular Rate:Normal     Neuro/Psych  Headaches, PSYCHIATRIC DISORDERS Anxiety Depression    GI/Hepatic Neg liver ROS, GERD  Medicated,  Endo/Other  negative endocrine ROS  Renal/GU negative Renal ROS     Musculoskeletal negative musculoskeletal ROS (+)   Abdominal Normal abdominal exam  (+) + obese,   Peds  Hematology negative hematology ROS (+)   Anesthesia Other Findings - HLD  Reproductive/Obstetrics                             Anesthesia Physical  Anesthesia Plan  ASA: 3  Anesthesia Plan: General   Post-op Pain Management:    Induction: Intravenous  PONV Risk Score and Plan: 2 and Ondansetron, Midazolam and Treatment may vary due to age or medical condition  Airway Management Planned: Oral ETT  Additional Equipment: None  Intra-op Plan:   Post-operative Plan: Extubation in OR  Informed Consent: I have reviewed the patients History and Physical, chart, labs and discussed the procedure including the risks, benefits and alternatives for the proposed anesthesia with the patient or authorized representative who has indicated his/her understanding and acceptance.     Dental advisory given  Plan Discussed with: CRNA  Anesthesia Plan Comments: (EKG: normal sinus rhythm.  Echo:  - Left ventricle: The cavity size was normal. Systolic function was  normal. The estimated ejection fraction was in the  range of 55%  to 60%. Wall motion was normal; there were no regional wall  motion abnormalities.  - Aortic valve: Transvalvular velocity was within the normal range.  There was no stenosis. There was no significant regurgitation.  - Mitral valve: There was trivial regurgitation.  - Left atrium: The atrium was mildly dilated.  - Right ventricle: Systolic function was normal.  - Right atrium: The atrium was mildly dilated.  - Atrial septum: No defect or patent foramen ovale was identified.  - Tricuspid valve: There was trivial regurgitation.  - Pulmonic valve: There was no significant regurgitation.  - Inferior vena cava: The vessel was normal in size. The  respirophasic diameter changes were in the normal range (>= 50%),  consistent with normal central venous pressure.  )        Anesthesia Quick Evaluation

## 2021-05-29 NOTE — Anesthesia Procedure Notes (Signed)
Anesthesia Regional Block: TAP block   Pre-Anesthetic Checklist: , timeout performed,  Correct Patient, Correct Site, Correct Laterality,  Correct Procedure, Correct Position, site marked,  Risks and benefits discussed,  Surgical consent,  Pre-op evaluation,  At surgeon's request and post-op pain management  Laterality: Right  Prep: chloraprep       Needles:  Injection technique: Single-shot  Needle Type: Stimiplex     Needle Length: 9cm  Needle Gauge: 21     Additional Needles:   Procedures:,,,, ultrasound used (permanent image in chart),,    Narrative:  Start time: 05/29/2021 12:38 PM End time: 05/29/2021 12:43 PM Injection made incrementally with aspirations every 5 mL.  Performed by: Personally  Anesthesiologist: Lynda Rainwater, MD

## 2021-05-30 ENCOUNTER — Encounter (HOSPITAL_BASED_OUTPATIENT_CLINIC_OR_DEPARTMENT_OTHER): Payer: Self-pay | Admitting: General Surgery

## 2021-05-30 NOTE — Anesthesia Postprocedure Evaluation (Signed)
Anesthesia Post Note  Patient: Todd Weeks  Procedure(s) Performed: RIGHT INGUINAL HERNIA REPAIR WITH MESH (Right: Groin)     Patient location during evaluation: PACU Anesthesia Type: General Level of consciousness: awake and alert Pain management: pain level controlled Vital Signs Assessment: post-procedure vital signs reviewed and stable Respiratory status: spontaneous breathing, nonlabored ventilation and respiratory function stable Cardiovascular status: blood pressure returned to baseline and stable Postop Assessment: no apparent nausea or vomiting Anesthetic complications: no   No notable events documented.  Last Vitals:  Vitals:   05/29/21 1545 05/29/21 1608  BP: (!) 150/80 137/87  Pulse: 62 66  Resp: 10 16  Temp:  36.6 C  SpO2: 93% 96%    Last Pain:  Vitals:   05/29/21 1606  TempSrc:   PainSc: [place]

## 2021-06-02 ENCOUNTER — Telehealth: Payer: Self-pay

## 2021-06-02 ENCOUNTER — Other Ambulatory Visit: Payer: Self-pay | Admitting: Family Medicine

## 2021-06-02 ENCOUNTER — Other Ambulatory Visit: Payer: Self-pay | Admitting: *Deleted

## 2021-06-02 MED ORDER — IRBESARTAN 300 MG PO TABS: 300.0000 mg | ORAL_TABLET | Freq: Every day | ORAL | 0 refills | Status: DC

## 2021-06-02 MED ORDER — ATORVASTATIN CALCIUM 40 MG PO TABS: 40.0000 mg | ORAL_TABLET | Freq: Every day | ORAL | 0 refills | Status: DC

## 2021-06-02 NOTE — Telephone Encounter (Signed)
Rx send to pharmacy  

## 2021-06-02 NOTE — Telephone Encounter (Signed)
States he has been waiting on refills since 10/17 with a request through [facility] for irbesartan and atorvastatin.  Patient is requesting a 90 day refill on both.  Please follow back up with patient in regard.

## 2021-06-15 ENCOUNTER — Other Ambulatory Visit: Payer: Self-pay | Admitting: Family Medicine

## 2021-06-15 DIAGNOSIS — F418 Other specified anxiety disorders: Secondary | ICD-10-CM

## 2021-06-20 DIAGNOSIS — G4733 Obstructive sleep apnea (adult) (pediatric): Secondary | ICD-10-CM | POA: Diagnosis not present

## 2021-06-23 ENCOUNTER — Other Ambulatory Visit: Payer: Self-pay | Admitting: General Surgery

## 2021-06-23 DIAGNOSIS — K4090 Unilateral inguinal hernia, without obstruction or gangrene, not specified as recurrent: Secondary | ICD-10-CM

## 2021-07-17 ENCOUNTER — Ambulatory Visit
Admission: RE | Admit: 2021-07-17 | Discharge: 2021-07-17 | Disposition: A | Discharge status: Home / Self Care | Payer: PPO | Source: Ambulatory Visit | Attending: General Surgery | Admitting: General Surgery

## 2021-07-17 ENCOUNTER — Other Ambulatory Visit: Payer: Self-pay

## 2021-07-17 DIAGNOSIS — K4090 Unilateral inguinal hernia, without obstruction or gangrene, not specified as recurrent: Secondary | ICD-10-CM | POA: Diagnosis not present

## 2021-07-17 DIAGNOSIS — I700 Atherosclerosis of aorta: Secondary | ICD-10-CM | POA: Diagnosis not present

## 2021-07-17 DIAGNOSIS — K5730 Diverticulosis of large intestine without perforation or abscess without bleeding: Secondary | ICD-10-CM | POA: Diagnosis not present

## 2021-07-17 MED ORDER — IOPAMIDOL (ISOVUE-300) INJECTION 61%
100.0000 mL | Freq: Once | INTRAVENOUS | Status: AC | PRN
  Administered 2021-07-17: 100 mL via INTRAVENOUS

## 2021-07-20 DIAGNOSIS — G4733 Obstructive sleep apnea (adult) (pediatric): Secondary | ICD-10-CM | POA: Diagnosis not present

## 2021-07-31 ENCOUNTER — Ambulatory Visit: Payer: PPO

## 2021-08-18 ENCOUNTER — Other Ambulatory Visit: Payer: Self-pay | Admitting: Family Medicine

## 2021-08-18 DIAGNOSIS — I2510 Atherosclerotic heart disease of native coronary artery without angina pectoris: Secondary | ICD-10-CM

## 2021-08-18 DIAGNOSIS — K219 Gastro-esophageal reflux disease without esophagitis: Secondary | ICD-10-CM

## 2021-09-01 DIAGNOSIS — G4733 Obstructive sleep apnea (adult) (pediatric): Secondary | ICD-10-CM | POA: Diagnosis not present

## 2021-09-04 ENCOUNTER — Ambulatory Visit (INDEPENDENT_AMBULATORY_CARE_PROVIDER_SITE_OTHER): Payer: PPO

## 2021-09-04 ENCOUNTER — Other Ambulatory Visit: Payer: Self-pay

## 2021-09-04 VITALS — BP 138/78 | HR 68 | Temp 98.0°F | Wt 261.6 lb

## 2021-09-04 DIAGNOSIS — Z0000 Encounter for general adult medical examination without abnormal findings: Secondary | ICD-10-CM

## 2021-09-04 NOTE — Progress Notes (Addendum)
Subjective:   Todd Weeks is a 66 y.o. male who presents for Medicare Annual/Subsequent preventive examination.  Review of Systems     Cardiac Risk Factors include: hypertension;dyslipidemia;male gender;obesity (BMI >30kg/m2);advanced age (>34men, >42 women)     Objective:    Today's Vitals   09/04/21 0802  BP: 138/78  Pulse: 68  Temp: 98 F (36.7 C)  SpO2: 93%  Weight: 261 lb 9.6 oz (118.7 kg)   Body mass index is 35.48 kg/m.  Advanced Directives 09/04/2021 05/29/2021 05/19/2021 04/05/2021 12/17/2020 12/17/2020 12/16/2020  Does Patient Have a Medical Advance Directive? Yes No No No No No No  Type of Advance Directive [place]  Does patient want to make changes to medical advance directive? Yes (MAU/Ambulatory/Procedural Areas - Information given) - - - - - -  Copy of [place] in Chart? No - copy requested - - - - - -  Would patient like information on creating a medical advance directive? - No - Patient declined No - Patient declined No - Patient declined - - No - Patient declined    Current Medications (verified) Outpatient Encounter Medications as of 09/04/2021  Medication Sig   aspirin EC 81 MG tablet Take 1 tablet (81 mg total) by mouth daily.   atorvastatin (LIPITOR) 40 MG tablet Take 1 tablet (40 mg total) by mouth daily.   clopidogrel (PLAVIX) 75 MG tablet TAKE (1) TABLET BY MOUTH ONCE DAILY.   diclofenac Sodium (VOLTAREN) 1 % GEL Apply 1 application topically 3 (three) times daily as needed (leg cramps/spasms).   famotidine (PEPCID) 20 MG tablet TAKE 1 TABLET BY MOUTH ONCE DAILY.   FLUoxetine (PROZAC) 40 MG capsule TAKE 1 CAPSULE(40 MG) BY MOUTH DAILY   irbesartan (AVAPRO) 300 MG tablet Take 1 tablet (300 mg total) by mouth daily.   tamsulosin (FLOMAX) 0.4 MG CAPS capsule Take 1 capsule (0.4 mg total) by mouth daily.   ALPRAZolam (XANAX) 0.25 MG tablet Take 1 tablet (0.25 mg total) by mouth 2 (two) times daily as needed  for anxiety. (Patient not taking: Reported on 09/04/2021)   FLUZONE HIGH-DOSE QUADRIVALENT 0.7 ML SUSY    nitroGLYCERIN (NITROSTAT) 0.4 MG SL tablet Place 0.4 mg under the tongue every 5 (five) minutes as needed for chest pain. (Patient not taking: Reported on 09/04/2021)   PFIZER COVID-19 VAC BIVALENT injection    [DISCONTINUED] HYDROcodone-acetaminophen (NORCO/VICODIN) 5-325 MG tablet Take 1 tablet by mouth every 6 (six) hours as needed for moderate pain or severe pain.   Facility-Administered Encounter Medications as of 09/04/2021  Medication   sodium chloride flush (NS) 0.9 % injection 3 mL    Allergies (verified) Patient has no known allergies.   History: Past Medical History:  Diagnosis Date   Bell's palsy    Cataract    Mixed form OU   Closed nondisplaced fracture of neck of third metacarpal bone of right hand 02/04/2017   Clotting disorder ([place])    Depression    Diverticulitis    GERD (gastroesophageal reflux disease)    Headache(784.0)    HTN (hypertension)    Hyperlipidemia    Hypertensive retinopathy    OU   Mild CAD 2012   PVD (peripheral vascular disease) ([place])    Retinal detachment    Sexual dysfunction    Sleep apnea    Past Surgical History:  Procedure Laterality Date   ABDOMINAL AORTOGRAM W/LOWER EXTREMITY Bilateral 02/05/2020   Procedure: ABDOMINAL AORTOGRAM  W/LOWER EXTREMITY;  Surgeon: Elam Dutch, MD;  Location: [facility];  Service: Cardiovascular;  Laterality: Bilateral;   ABDOMINAL AORTOGRAM W/LOWER EXTREMITY N/A 12/16/2020   Procedure: ABDOMINAL AORTOGRAM W/LOWER EXTREMITY;  Surgeon: Elam Dutch, MD;  Location: [facility];  Service: Cardiovascular;  Laterality: N/A;   CATARACT EXTRACTION     EYE SURGERY Right 05/06/2020   Pneumatic retinopexy for rheg. RD repair - Dr. Bernarda Caffey   EYE SURGERY Right 05/26/2020   PPV/MP - Dr. Bernarda Caffey   GAS INSERTION Right 05/26/2020   Procedure: INSERTION OF GAS;  Surgeon: Bernarda Caffey, MD;  Location: [facility];  Service: Ophthalmology;  Laterality: Right;   INGUINAL HERNIA REPAIR Right 05/29/2021   Procedure: RIGHT INGUINAL HERNIA REPAIR WITH MESH;  Surgeon: Jovita Kussmaul, MD;  Location: [facility];  Service: General;  Laterality: Right;  TAP BLOCK   LEFT HEART CATH AND CORONARY ANGIOGRAPHY N/A 04/05/2021   Procedure: LEFT HEART CATH AND CORONARY ANGIOGRAPHY;  Surgeon: Burnell Blanks, MD;  Location: [facility];  Service: Cardiovascular;  Laterality: N/A;   MEMBRANE PEEL Right 05/26/2020   Procedure: MEMBRANE PEEL;  Surgeon: Bernarda Caffey, MD;  Location: [facility];  Service: Ophthalmology;  Laterality: Right;   PARS PLANA VITRECTOMY Right 05/26/2020   Procedure: PARS PLANA VITRECTOMY WITH 25 GAUGE;  Surgeon: Bernarda Caffey, MD;  Location: [facility];  Service: Ophthalmology;  Laterality: Right;   PERIPHERAL VASCULAR INTERVENTION Right 12/16/2020   Procedure: PERIPHERAL VASCULAR INTERVENTION;  Surgeon: Elam Dutch, MD;  Location: [facility];  Service: Cardiovascular;  Laterality: Right;  SFA (distal)   PHOTOCOAGULATION Right 05/26/2020   Procedure: PHOTOCOAGULATION;  Surgeon: Bernarda Caffey, MD;  Location: [facility];  Service: Ophthalmology;  Laterality: Right;   PROSTATE BIOPSY  2015   RETINAL DETACHMENT SURGERY Right 05/06/2020   Pneumatic retinopexy for rheg RD repair - Dr. Bernarda Caffey   RETINAL DETACHMENT SURGERY Right 05/26/2020   PPV/MP - Dr. Bernarda Caffey   Family History  Problem Relation Age of Onset   Hypertension Mother    Deep vein thrombosis Father    Diabetes Brother    Tuberculosis Maternal Grandfather    Emphysema Maternal Grandfather    Lung disease Brother    Asthma Son    Rectal cancer Neg Hx    Colon cancer Neg Hx    Social History   Socioeconomic History   Marital status: Single    Spouse name: Not on file   Number of children: 2   Years of education: 12   Highest education level: Not on file  Occupational  History   Occupation: Electrical engineer   Occupation: Unemployed    Comment: disability  Tobacco Use   Smoking status: Former    Packs/day: 1.50    Years: 46.00    Pack years: 69.00    Types: Cigarettes    Quit date: 12/12/2010    Years since quitting: 10.7   Smokeless tobacco: Never  Vaping Use   Vaping Use: Never used  Substance and Sexual Activity   Alcohol use: Yes    Alcohol/week: 4.0 standard drinks    Types: 4 Cans of beer per week    Comment: a couple of beer on a weekend   Drug use: Not Currently    Types: Cocaine, Marijuana, "Crack" cocaine    Comment: over 25 years ago   Sexual activity: Not on file  Other Topics Concern   Not on  file  Social History Narrative   Raised in [place], [place]. Does not have any religious beliefs that would effect healthcare. Lives in house with sister. Likes to ride motorcycle for fun.    Moving out on his own; in the next couple of weeks will be moving back home that he had rented      [employer]; with a helmet   Social Determinants of Health   Financial Resource Strain: Low Risk    Difficulty of Paying Living Expenses: Not hard at all  Food Insecurity: No Food Insecurity   Worried About Charity fundraiser in the Last Year: Never true   [place] in the Last Year: Never true  Transportation Needs: No Transportation Needs   Lack of Transportation (Medical): No   Lack of Transportation (Non-Medical): No  Physical Activity: Inactive   Days of Exercise per Week: 0 days   Minutes of Exercise per Session: 0 min  Stress: Stress Concern Present   Feeling of Stress : To some extent  Social Connections: Socially Isolated   Frequency of Communication with Friends and Family: More than three times a week   Frequency of Social Gatherings with Friends and Family: Once a week   Attends Religious Services: Never   Marine scientist or Organizations: No   Attends Music therapist: Never   Marital Status: Never married     Tobacco Counseling Counseling given: Not Answered   Clinical Intake:  Pre-visit preparation completed: Yes  Pain : No/denies pain     BMI - recorded: 35.48 Nutritional Status: BMI > 30  Obese Nutritional Risks: None Diabetes: No  How often do you need to have someone help you when you read instructions, pamphlets, or other written materials from your doctor or pharmacy?: 1 - Never  Diabetic?No  Interpreter Needed?: No  Information entered by :: Charlott Rakes, LPN   Activities of Daily Living In your present state of health, do you have any difficulty performing the following activities: 09/04/2021 05/29/2021  Hearing? N N  Vision? N N  Difficulty concentrating or making decisions? N N  Walking or climbing stairs? N N  Dressing or bathing? N N  Doing errands, shopping? N -  Preparing Food and eating ? N -  Using the Toilet? N -  In the past six months, have you accidently leaked urine? N -  Do you have problems with loss of bowel control? N -  Managing your Medications? N -  Managing your Finances? N -  Housekeeping or managing your Housekeeping? N -  Some recent data might be hidden    Patient Care Team: Vivi Barrack, MD as PCP - General (Family Medicine) Burnell Blanks, MD as PCP - Cardiology (Cardiology) Burnell Blanks, MD as Consulting Physician (Cardiology) Elam Dutch, MD (Inactive) as Consulting Physician (Vascular Surgery) Deneise Lever, MD as Consulting Physician (Pulmonary Disease) Franchot Gallo, MD as Consulting Physician (Urology)  Indicate any recent Medical Services you may have received from other than [facility] providers in the past year (date may be approximate).     Assessment:   This is a routine wellness examination for [place].  Hearing/Vision screen Hearing Screening - Comments:: Pt denies any hearing issues  Vision Screening - Comments:: Pt follows up with [facility] Dr Coralyn Pear  bi annually  for annual eye exams   Dietary issues and exercise activities discussed: Current Exercise Habits: The patient has a physically strenuous job, but has  no regular exercise apart from work.   Goals Addressed             This Visit's Progress    Patient Stated       Lose 35 lbs        Depression Screen PHQ 2/9 Scores 09/04/2021 02/20/2021 07/25/2020 06/03/2019 03/13/2019 12/10/2018 08/04/2018  PHQ - 2 Score 2 2 2  0 1 1 1   PHQ- 9 Score 4 4 6  - 1 4 2     Fall Risk Fall Risk  09/04/2021 07/25/2020 06/03/2019 03/13/2019 12/10/2018  Falls in the past year? 0 0 0 0 0  Number falls in past yr: 0 0 - 0 0  Injury with Fall? 0 0 0 0 0  Risk for fall due to : Impaired vision;Impaired balance/gait Impaired vision;Impaired balance/gait - - -  Risk for fall due to: Comment at times with balance - - - -  Follow up Falls prevention discussed Falls prevention discussed Education provided;Falls prevention discussed;Falls evaluation completed - -    FALL RISK PREVENTION PERTAINING TO THE HOME:  Any stairs in or around the home? No  If so, are there any without handrails? No  Home free of loose throw rugs in walkways, pet beds, electrical cords, etc? Yes  Adequate lighting in your home to reduce risk of falls? Yes   ASSISTIVE DEVICES UTILIZED TO PREVENT FALLS:  Life alert? No  Use of a cane, walker or w/c? No  Grab bars in the bathroom? No  Shower chair or bench in shower? No  Elevated toilet seat or a handicapped toilet? No   TIMED UP AND GO:  Was the test performed? Yes .  Length of time to ambulate 10 feet: 10 sec.   Gait steady and fast without use of assistive device  Cognitive Function: MMSE - Mini Mental State Exam 12/13/2014  Not completed: Unable to complete     6CIT Screen 09/04/2021 07/25/2020  What Year? 0 points 0 points  What month? 0 points 0 points  What time? 0 points -  Count back from 20 0 points 0 points  Months in reverse 4 points 4 points  Repeat phrase 0 points -   Total Score 4 -    Immunizations Immunization History  Administered Date(s) Administered   Influenza Split 05/14/2015   Influenza, High Dose Seasonal PF 04/26/2019   Influenza,inj,Quad PF,6+ Mos 08/02/2016, 06/16/2017, 06/12/2018, 04/26/2019   Influenza-Unspecified 06/16/2017, 05/21/2020, 06/06/2021   PFIZER(Purple Top)SARS-COV-2 Vaccination 10/22/2019, 11/11/2019, 05/12/2020, 06/06/2021   Pneumococcal Polysaccharide-23 08/04/2018   Tdap 12/13/2014   Zoster Recombinat (Shingrix) 05/21/2020    TDAP status: Up to date  Flu Vaccine status: Up to date  Pneumococcal vaccine status: Up to date  Covid-19 vaccine status: Completed vaccines  Qualifies for Shingles Vaccine? Yes   Zostavax completed Yes   Shingrix Completed?: Yes 1st dose 05/21/20  Screening Tests Health Maintenance  Topic Date Due   Pneumonia Vaccine 19+ Years old (2 - PCV) 08/05/2019   Zoster Vaccines- Shingrix (2 of 2) 07/16/2020   COVID-19 Vaccine (5 - Booster for Pfizer series) 08/01/2021   COLONOSCOPY (Pts 45-82yrs Insurance coverage will need to be confirmed)  10/07/2023   TETANUS/TDAP  12/12/2024   INFLUENZA VACCINE  Completed   Hepatitis C Screening  Completed   HPV VACCINES  Aged Out    Health Maintenance  Health Maintenance Due  Topic Date Due   Pneumonia Vaccine 40+ Years old (2 - PCV) 08/05/2019   Zoster Vaccines- Shingrix (2 of 2)  07/16/2020   COVID-19 Vaccine (5 - Booster for Pfizer series) 08/01/2021    Colorectal cancer screening: Type of screening: Colonoscopy. Completed 10/06/18. Repeat every 5 years   Additional Screening:  Hepatitis C Screening: Completed 08/02/16  Vision Screening: Recommended annual ophthalmology exams for early detection of glaucoma and other disorders of the eye. Is the patient up to date with their annual eye exam?  No bi annually  Who is the provider or what is the name of the office in which the patient attends annual eye exams? [facility] Dr Coralyn Pear  If  pt is not established with a provider, would they like to be referred to a provider to establish care? No .   Dental Screening: Recommended annual dental exams for proper oral hygiene  Community Resource Referral / Chronic Care Management: CRR required this visit?  No   CCM required this visit?  No      Plan:     I have personally reviewed and noted the following in the patients chart:   Medical and social history Use of alcohol, tobacco or illicit drugs  Current medications and supplements including opioid prescriptions. Patient is not currently taking opioid prescriptions. Functional ability and status Nutritional status Physical activity Advanced directives List of other physicians Hospitalizations, surgeries, and ER visits in previous 12 months Vitals Screenings to include cognitive, depression, and falls Referrals and appointments  In addition, I have reviewed and discussed with patient certain preventive protocols, quality metrics, and best practice recommendations. A written personalized care plan for preventive services as well as general preventive health recommendations were provided to patient.     Willette Brace, LPN   [id number]   Nurse Notes: none

## 2021-09-04 NOTE — Patient Instructions (Signed)
Mr. Todd Weeks , Thank you for taking time to come for your Medicare Wellness Visit. I appreciate your ongoing commitment to your health goals. Please review the following plan we discussed and let me know if I can assist you in the future.   Screening recommendations/referrals: Colonoscopy: Done 10/06/18 repeat every 5 years  Recommended yearly ophthalmology/optometry visit for glaucoma screening and checkup Recommended yearly dental visit for hygiene and checkup  Vaccinations: Influenza vaccine: Due and discussed  Pneumococcal vaccine: Due and discussed  Tdap vaccine: Done 12/13/14 repeat every 10 years  Shingles vaccine: 1st dose 05/21/20    Covid-19: Completed 3/11, 3/31, & 05/12/20  Advanced directives: Please bring a copy of your health care power of attorney and living will to the office at your convenience.  Conditions/risks identified: lose weight 35 lbs   Next appointment: Follow up in one year for your annual wellness visit.   Preventive Care 2 Years and Older, Male Preventive care refers to lifestyle choices and visits with your health care provider that can promote health and wellness. What does preventive care include? A yearly physical exam. This is also called an annual well check. Dental exams once or twice a year. Routine eye exams. Ask your health care provider how often you should have your eyes checked. Personal lifestyle choices, including: Daily care of your teeth and gums. Regular physical activity. Eating a healthy diet. Avoiding tobacco and drug use. Limiting alcohol use. Practicing safe sex. Taking low doses of aspirin every day. Taking vitamin and mineral supplements as recommended by your health care provider. What happens during an annual well check? The services and screenings done by your health care provider during your annual well check will depend on your age, overall health, lifestyle risk factors, and family history of disease. Counseling  Your health  care provider may ask you questions about your: Alcohol use. Tobacco use. Drug use. Emotional well-being. Home and relationship well-being. Sexual activity. Eating habits. History of falls. Memory and ability to understand (cognition). Work and work Statistician. Screening  You may have the following tests or measurements: Height, weight, and BMI. Blood pressure. Lipid and cholesterol levels. These may be checked every 5 years, or more frequently if you are over 24 years old. Skin check. Lung cancer screening. You may have this screening every year starting at age 28 if you have a 30-pack-year history of smoking and currently smoke or have quit within the past 15 years. Fecal occult blood test (FOBT) of the stool. You may have this test every year starting at age 20. Flexible sigmoidoscopy or colonoscopy. You may have a sigmoidoscopy every 5 years or a colonoscopy every 10 years starting at age 74. Prostate cancer screening. Recommendations will vary depending on your family history and other risks. Hepatitis C blood test. Hepatitis B blood test. Sexually transmitted disease (STD) testing. Diabetes screening. This is done by checking your blood sugar (glucose) after you have not eaten for a while (fasting). You may have this done every 1-3 years. Abdominal aortic aneurysm (AAA) screening. You may need this if you are a current or former smoker. Osteoporosis. You may be screened starting at age 52 if you are at high risk. Talk with your health care provider about your test results, treatment options, and if necessary, the need for more tests. Vaccines  Your health care provider may recommend certain vaccines, such as: Influenza vaccine. This is recommended every year. Tetanus, diphtheria, and acellular pertussis (Tdap, Td) vaccine. You may need a Td  booster every 10 years. Zoster vaccine. You may need this after age 47. Pneumococcal 13-valent conjugate (PCV13) vaccine. One dose is  recommended after age 25. Pneumococcal polysaccharide (PPSV23) vaccine. One dose is recommended after age 73. Talk to your health care provider about which screenings and vaccines you need and how often you need them. This information is not intended to replace advice given to you by your health care provider. Make sure you discuss any questions you have with your health care provider. Document Released: 08/26/2015 Document Revised: 04/18/2016 Document Reviewed: 05/31/2015 Elsevier Interactive Patient Education  2017 [employer] Prevention in the Home Falls can cause injuries. They can happen to people of all ages. There are many things you can do to make your home safe and to help prevent falls. What can I do on the outside of my home? Regularly fix the edges of walkways and driveways and fix any cracks. Remove anything that might make you trip as you walk through a door, such as a raised step or threshold. Trim any bushes or trees on the path to your home. Use bright outdoor lighting. Clear any walking paths of anything that might make someone trip, such as rocks or tools. Regularly check to see if handrails are loose or broken. Make sure that both sides of any steps have handrails. Any raised decks and porches should have guardrails on the edges. Have any leaves, snow, or ice cleared regularly. Use sand or salt on walking paths during winter. Clean up any spills in your garage right away. This includes oil or grease spills. What can I do in the bathroom? Use night lights. Install grab bars by the toilet and in the tub and shower. Do not use towel bars as grab bars. Use non-skid mats or decals in the tub or shower. If you need to sit down in the shower, use a plastic, non-slip stool. Keep the floor dry. Clean up any water that spills on the floor as soon as it happens. Remove soap buildup in the tub or shower regularly. Attach bath mats securely with double-sided non-slip rug  tape. Do not have throw rugs and other things on the floor that can make you trip. What can I do in the bedroom? Use night lights. Make sure that you have a light by your bed that is easy to reach. Do not use any sheets or blankets that are too big for your bed. They should not hang down onto the floor. Have a firm chair that has side arms. You can use this for support while you get dressed. Do not have throw rugs and other things on the floor that can make you trip. What can I do in the kitchen? Clean up any spills right away. Avoid walking on wet floors. Keep items that you use a lot in easy-to-reach places. If you need to reach something above you, use a strong step stool that has a grab bar. Keep electrical cords out of the way. Do not use floor polish or wax that makes floors slippery. If you must use wax, use non-skid floor wax. Do not have throw rugs and other things on the floor that can make you trip. What can I do with my stairs? Do not leave any items on the stairs. Make sure that there are handrails on both sides of the stairs and use them. Fix handrails that are broken or loose. Make sure that handrails are as [place] as the stairways. Check any carpeting  to make sure that it is firmly attached to the stairs. Fix any carpet that is loose or worn. Avoid having throw rugs at the top or bottom of the stairs. If you do have throw rugs, attach them to the floor with carpet tape. Make sure that you have a light switch at the top of the stairs and the bottom of the stairs. If you do not have them, ask someone to add them for you. What else can I do to help prevent falls? Wear shoes that: Do not have high heels. Have rubber bottoms. Are comfortable and fit you well. Are closed at the toe. Do not wear sandals. If you use a stepladder: Make sure that it is fully opened. Do not climb a closed stepladder. Make sure that both sides of the stepladder are locked into place. Ask someone to  hold it for you, if possible. Clearly mark and make sure that you can see: Any grab bars or handrails. First and last steps. Where the edge of each step is. Use tools that help you move around (mobility aids) if they are needed. These include: Canes. Walkers. Scooters. Crutches. Turn on the lights when you go into a dark area. Replace any light bulbs as soon as they burn out. Set up your furniture so you have a clear path. Avoid moving your furniture around. If any of your floors are uneven, fix them. If there are any pets around you, be aware of where they are. Review your medicines with your doctor. Some medicines can make you feel dizzy. This can increase your chance of falling. Ask your doctor what other things that you can do to help prevent falls. This information is not intended to replace advice given to you by your health care provider. Make sure you discuss any questions you have with your health care provider. Document Released: 05/26/2009 Document Revised: 01/05/2016 Document Reviewed: 09/03/2014 Elsevier Interactive Patient Education  2017 [employer].

## 2021-09-08 ENCOUNTER — Ambulatory Visit: Payer: PPO | Admitting: Pulmonary Disease

## 2021-09-08 NOTE — Progress Notes (Deleted)
Synopsis: Referred in June 2022 for shortness of breath. Former patient of Dr. Melvyn Novas  Subjective:   PATIENT ID: Todd Weeks GENDER: male DOB: 11/06/1955, MRN: [id number]   HPI  No chief complaint on file.   Todd Weeks is a 66 year old man, former smoker with mild centrilobular emphysema, GERD, anxiety, hypertension and obesity who returns to pulmonary clinic for evaluation of shortness of breath.   Patient reports that he has had increasing shortness of breath over the last 2 weeks.  The shortness of breath seems to be worse at night when he is trying to sleep.  He denies any cough, sputum production or wheezing with the shortness of breath.  He denies any recent illnesses or sick contacts.  He reports he has increased levels of anxiety due to personal social stressors related to work and his family.  He does report that when he is under a lot of stress he has increased anxiety and also increased shortness of breath.  He had work-up in the past for shortness of breath with normal pulmonary function testing and no response to inhaler therapy.  He has mild centrilobular emphysematous changes on CT chest imaging.  His shortness of breath in the past improved with 60 to 70 pound weight loss which he has gained some weight back since that time.  He recently had a vascular surgery operation of his right lower extremity with stent placements.  He reports the pain is improved in his right leg but he has had increased swelling.  He has a visit with the vascular surgical team tomorrow for follow-up.  He does report snoring at night and expresses some concern for sleep apnea. SOB over past couple of weeks   Past Medical History:  Diagnosis Date   Bell's palsy    Cataract    Mixed form OU   Closed nondisplaced fracture of neck of third metacarpal bone of right hand 02/04/2017   Clotting disorder (HCC)    Depression    Diverticulitis    GERD (gastroesophageal reflux disease)    Headache(784.0)     HTN (hypertension)    Hyperlipidemia    Hypertensive retinopathy    OU   Mild CAD 2012   PVD (peripheral vascular disease) ([place])    Retinal detachment    Sexual dysfunction    Sleep apnea      Family History  Problem Relation Age of Onset   Hypertension Mother    Deep vein thrombosis Father    Diabetes Brother    Tuberculosis Maternal Grandfather    Emphysema Maternal Grandfather    Lung disease Brother    Asthma Son    Rectal cancer Neg Hx    Colon cancer Neg Hx      Social History   Socioeconomic History   Marital status: Single    Spouse name: Not on file   Number of children: 2   Years of education: 12   Highest education level: Not on file  Occupational History   Occupation: Electrical engineer   Occupation: Unemployed    Comment: disability  Tobacco Use   Smoking status: Former    Packs/day: 1.50    Years: 46.00    Pack years: 69.00    Types: Cigarettes    Quit date: 12/12/2010    Years since quitting: 10.7   Smokeless tobacco: Never  Vaping Use   Vaping Use: Never used  Substance and Sexual Activity   Alcohol use: Yes    Alcohol/week:  4.0 standard drinks    Types: 4 Cans of beer per week    Comment: a couple of beer on a weekend   Drug use: Not Currently    Types: Cocaine, Marijuana, "Crack" cocaine    Comment: over 25 years ago   Sexual activity: Not on file  Other Topics Concern   Not on file  Social History Narrative   Raised in [place], [place]. Does not have any religious beliefs that would effect healthcare. Lives in house with sister. Likes to ride motorcycle for fun.    Moving out on his own; in the next couple of weeks will be moving back home that he had rented      [employer]; with a helmet   Social Determinants of Health   Financial Resource Strain: Low Risk    Difficulty of Paying Living Expenses: Not hard at all  Food Insecurity: No Food Insecurity   Worried About Charity fundraiser in the Last Year: Never true   [place] in the Last Year: Never true  Transportation Needs: No Transportation Needs   Lack of Transportation (Medical): No   Lack of Transportation (Non-Medical): No  Physical Activity: Inactive   Days of Exercise per Week: 0 days   Minutes of Exercise per Session: 0 min  Stress: Stress Concern Present   Feeling of Stress : To some extent  Social Connections: Socially Isolated   Frequency of Communication with Friends and Family: More than three times a week   Frequency of Social Gatherings with Friends and Family: Once a week   Attends Religious Services: Never   Marine scientist or Organizations: No   Attends Music therapist: Never   Marital Status: Never married  Human resources officer Violence: Not At Risk   Fear of Current or Ex-Partner: No   Emotionally Abused: No   Physically Abused: No   Sexually Abused: No     No Known Allergies   Outpatient Medications Prior to Visit  Medication Sig Dispense Refill   ALPRAZolam (XANAX) 0.25 MG tablet Take 1 tablet (0.25 mg total) by mouth 2 (two) times daily as needed for anxiety. (Patient not taking: Reported on 09/04/2021) 20 tablet 0   aspirin EC 81 MG tablet Take 1 tablet (81 mg total) by mouth daily. 150 tablet 2   atorvastatin (LIPITOR) 40 MG tablet Take 1 tablet (40 mg total) by mouth daily. 90 tablet 0   clopidogrel (PLAVIX) 75 MG tablet TAKE (1) TABLET BY MOUTH ONCE DAILY. 90 tablet 0   diclofenac Sodium (VOLTAREN) 1 % GEL Apply 1 application topically 3 (three) times daily as needed (leg cramps/spasms).     famotidine (PEPCID) 20 MG tablet TAKE 1 TABLET BY MOUTH ONCE DAILY. 90 tablet 0   FLUoxetine (PROZAC) 40 MG capsule TAKE 1 CAPSULE(40 MG) BY MOUTH DAILY 90 capsule 0   FLUZONE HIGH-DOSE QUADRIVALENT 0.7 ML SUSY      irbesartan (AVAPRO) 300 MG tablet Take 1 tablet (300 mg total) by mouth daily. 90 tablet 0   nitroGLYCERIN (NITROSTAT) 0.4 MG SL tablet Place 0.4 mg under the tongue every 5 (five) minutes as needed for  chest pain. (Patient not taking: Reported on 09/04/2021)     PFIZER COVID-19 VAC BIVALENT injection      tamsulosin (FLOMAX) 0.4 MG CAPS capsule Take 1 capsule (0.4 mg total) by mouth daily. 30 capsule 3   Facility-Administered Medications Prior to Visit  Medication Dose Route Frequency Provider Last Rate  Last Admin   sodium chloride flush (NS) 0.9 % injection 3 mL  3 mL Intravenous Q12H Almyra Deforest, PA        Review of Systems  Constitutional:  Negative for chills, fever, malaise/fatigue and weight loss.  HENT:  Negative for congestion, sinus pain and sore throat.   Eyes: Negative.   Respiratory:  Positive for shortness of breath. Negative for cough, hemoptysis, sputum production and wheezing.   Cardiovascular:  Negative for chest pain, palpitations, orthopnea, claudication and leg swelling.  Gastrointestinal:  Negative for abdominal pain, heartburn, nausea and vomiting.  Genitourinary: Negative.   Musculoskeletal:  Negative for joint pain and myalgias.  Skin:  Negative for rash.  Neurological:  Negative for weakness.  Endo/Heme/Allergies: Negative.   Psychiatric/Behavioral:  The patient is nervous/anxious.      Objective:   There were no vitals filed for this visit.    Physical Exam Constitutional:      General: He is not in acute distress.    Appearance: He is obese.  HENT:     Head: Normocephalic and atraumatic.  Eyes:     Extraocular Movements: Extraocular movements intact.     Conjunctiva/sclera: Conjunctivae normal.     Pupils: Pupils are equal, round, and reactive to light.  Cardiovascular:     Rate and Rhythm: Normal rate and regular rhythm.     Pulses: Normal pulses.     Heart sounds: Normal heart sounds. No murmur heard. Pulmonary:     Effort: Pulmonary effort is normal.     Breath sounds: Normal breath sounds. No wheezing, rhonchi or rales.  Abdominal:     General: Bowel sounds are normal.     Palpations: Abdomen is soft.  Musculoskeletal:     Right lower  leg: No edema.     Left lower leg: No edema.  Lymphadenopathy:     Cervical: No cervical adenopathy.  Skin:    General: Skin is warm and dry.  Neurological:     General: No focal deficit present.     Mental Status: He is alert.  Psychiatric:        Mood and Affect: Mood normal.        Behavior: Behavior normal.        Thought Content: Thought content normal.        Judgment: Judgment normal.      CBC    Component Value Date/Time   WBC 6.6 03/30/2021 0856   RBC 4.90 03/30/2021 0856   HGB 14.0 03/30/2021 0856   HGB 14.3 03/23/2021 1601   HCT 42.3 03/30/2021 0856   HCT 43.9 03/23/2021 1601   PLT 213.0 03/30/2021 0856   PLT 234 03/23/2021 1601   MCV 86.4 03/30/2021 0856   MCV 87 03/23/2021 1601   MCH 28.3 03/23/2021 1601   MCH 29.6 05/26/2020 1305   MCHC 33.0 03/30/2021 0856   RDW 14.0 03/30/2021 0856   RDW 13.0 03/23/2021 1601   LYMPHSABS 1.4 03/30/2021 0856   MONOABS 0.7 03/30/2021 0856   EOSABS 0.3 03/30/2021 0856   BASOSABS 0.0 03/30/2021 0856   BMP Latest Ref Rng & Units 03/30/2021 03/23/2021 03/09/2021  Glucose 70 - 99 mg/dL 95 92 100(H)  BUN 6 - 23 mg/dL 17 17 17   Creatinine 0.40 - 1.50 mg/dL 0.67 0.68(L) 0.62(L)  BUN/Creat Ratio 10 - 24 - 25(H) 27(H)  Sodium 135 - 145 mEq/L 137 138 138  Potassium 3.5 - 5.1 mEq/L 4.6 4.1 4.8  Chloride 96 - 112 mEq/L 103 100  101  CO2 19 - 32 mEq/L 26 24 22   Calcium 8.4 - 10.5 mg/dL 9.2 9.6 9.3   Chest imaging: Lung Cancer Screening CT Chest 08/22/20 Mediastinum/Nodes: No discrete thyroid nodules. Unremarkable esophagus. No pathologically enlarged axillary, mediastinal or hilar lymph nodes, noting limited sensitivity for the detection of hilar adenopathy on this noncontrast study.   Lungs/Pleura: No pneumothorax. No pleural effusion. Mild centrilobular emphysema with mild diffuse bronchial wall thickening. No acute consolidative airspace disease or lung masses. No significant growth of previously visualized scattered  pulmonary nodules. No new significant pulmonary nodules.  PFT: PFT Results Latest Ref Rng & Units 07/04/2018 10/28/2014  FVC-Pre L 5.00 4.61  FVC-Predicted Pre % 97 88  FVC-Post L 5.00 4.66  FVC-Predicted Post % 98 89  Pre FEV1/FVC % % 77 73  Post FEV1/FCV % % 75 74  FEV1-Pre L 3.85 3.35  FEV1-Predicted Pre % 100 85  FEV1-Post L 3.73 3.47  DLCO uncorrected ml/min/mmHg 31.78 30.26  DLCO UNC% % 89 84  DLCO corrected ml/min/mmHg 31.78 -  DLCO COR %Predicted % 89 -  DLVA Predicted % 88 95  TLC L 7.62 7.78  TLC % Predicted % 101 103  RV % Predicted % 107 122  PFT 2019: Pulmonary function testing within normal limits  Echo 04/26/2018: - Normal LV EF, no wall motion abnormalities.  RV systolic function is normal.  Assessment & Plan:   No diagnosis found.  Discussion: Todd Weeks is a 66 year old man, former smoker with mild centrilobular emphysema, GERD, anxiety, hypertension and obesity who returns to pulmonary clinic for evaluation of shortness of breath.   Patient's shortness of breath at this time appears to be related to anxiety and social stressors in his life as he reported his breathing improved last night after he took a Xanax.  Given that his shortness of breath started over the last couple of weeks and he had a recent lower extremity vascular surgery and now has swelling of that leg there is potential concern for DVT/PE.  We will check a D-dimer level and if elevated we will then proceed with CTA chest.  He has follow-up with his vascular surgery team on 02/09/2021 and I have asked him to talk with them about performing a lower extremity ultrasound to rule out DVT.  Given his concern for obstructive sleep apnea we will schedule him for home sleep study in the future.  Freda Jackson, MD [facility] Office: [phone]   Current Outpatient Medications:    ALPRAZolam (XANAX) 0.25 MG tablet, Take 1 tablet (0.25 mg total) by mouth 2 (two) times daily  as needed for anxiety. (Patient not taking: Reported on 09/04/2021), Disp: 20 tablet, Rfl: 0   aspirin EC 81 MG tablet, Take 1 tablet (81 mg total) by mouth daily., Disp: 150 tablet, Rfl: 2   atorvastatin (LIPITOR) 40 MG tablet, Take 1 tablet (40 mg total) by mouth daily., Disp: 90 tablet, Rfl: 0   clopidogrel (PLAVIX) 75 MG tablet, TAKE (1) TABLET BY MOUTH ONCE DAILY., Disp: 90 tablet, Rfl: 0   diclofenac Sodium (VOLTAREN) 1 % GEL, Apply 1 application topically 3 (three) times daily as needed (leg cramps/spasms)., Disp: , Rfl:    famotidine (PEPCID) 20 MG tablet, TAKE 1 TABLET BY MOUTH ONCE DAILY., Disp: 90 tablet, Rfl: 0   FLUoxetine (PROZAC) 40 MG capsule, TAKE 1 CAPSULE(40 MG) BY MOUTH DAILY, Disp: 90 capsule, Rfl: 0   FLUZONE HIGH-DOSE QUADRIVALENT 0.7 ML SUSY, , Disp: ,  Rfl:    irbesartan (AVAPRO) 300 MG tablet, Take 1 tablet (300 mg total) by mouth daily., Disp: 90 tablet, Rfl: 0   nitroGLYCERIN (NITROSTAT) 0.4 MG SL tablet, Place 0.4 mg under the tongue every 5 (five) minutes as needed for chest pain. (Patient not taking: Reported on 09/04/2021), Disp: , Rfl:    PFIZER COVID-19 VAC BIVALENT injection, , Disp: , Rfl:    tamsulosin (FLOMAX) 0.4 MG CAPS capsule, Take 1 capsule (0.4 mg total) by mouth daily., Disp: 30 capsule, Rfl: 3  Current Facility-Administered Medications:    sodium chloride flush (NS) 0.9 % injection 3 mL, 3 mL, Intravenous, Q12H, Almyra Deforest, [place]

## 2021-09-11 ENCOUNTER — Other Ambulatory Visit: Payer: Self-pay | Admitting: Family Medicine

## 2021-09-11 DIAGNOSIS — F418 Other specified anxiety disorders: Secondary | ICD-10-CM

## 2021-09-12 ENCOUNTER — Telehealth: Payer: Self-pay | Admitting: Pulmonary Disease

## 2021-09-13 NOTE — Telephone Encounter (Signed)
Sending msg to [employer] per protocol and closing encounter.

## 2021-09-18 ENCOUNTER — Other Ambulatory Visit: Payer: Self-pay | Admitting: Family Medicine

## 2021-09-26 ENCOUNTER — Other Ambulatory Visit: Payer: Self-pay | Admitting: *Deleted

## 2021-09-26 DIAGNOSIS — Z87891 Personal history of nicotine dependence: Secondary | ICD-10-CM

## 2021-10-09 ENCOUNTER — Ambulatory Visit (INDEPENDENT_AMBULATORY_CARE_PROVIDER_SITE_OTHER)
Admission: RE | Admit: 2021-10-09 | Discharge: 2021-10-09 | Disposition: A | Discharge status: Home / Self Care | Payer: PPO | Source: Ambulatory Visit | Attending: Acute Care | Admitting: Acute Care

## 2021-10-09 ENCOUNTER — Other Ambulatory Visit: Payer: Self-pay

## 2021-10-09 DIAGNOSIS — Z87891 Personal history of nicotine dependence: Secondary | ICD-10-CM

## 2021-10-12 ENCOUNTER — Other Ambulatory Visit: Payer: Self-pay | Admitting: Acute Care

## 2021-10-12 DIAGNOSIS — Z87891 Personal history of nicotine dependence: Secondary | ICD-10-CM

## 2021-10-23 ENCOUNTER — Ambulatory Visit: Payer: PPO | Admitting: Pulmonary Disease

## 2021-10-23 ENCOUNTER — Encounter: Payer: Self-pay | Admitting: Pulmonary Disease

## 2021-10-23 ENCOUNTER — Other Ambulatory Visit: Payer: Self-pay

## 2021-10-23 VITALS — BP 116/78 | HR 66 | Temp 98.3°F | Ht 72.0 in | Wt 253.0 lb

## 2021-10-23 DIAGNOSIS — G4733 Obstructive sleep apnea (adult) (pediatric): Secondary | ICD-10-CM | POA: Diagnosis not present

## 2021-10-23 NOTE — Progress Notes (Signed)
Todd Weeks    [id number]    1956/08/10  Primary Care Physician:Parker, [name], MD  Referring Physician: Vivi Barrack, MD [address] [place],  [place] [address]  Chief complaint:   Patient being seen for severe obstructive sleep apnea  HPI:  Severe obstructive sleep apnea on CPAP therapy Not tolerating CPAP Has had mask changes -CPAP is keeping him up much more than is helping -Mask leaks all the time  -Feels his sleep quality is much worse with trying to get used to CPAP, not getting a full nights rest and feels that he will be better off not using anything or at least looking for other options of care  We did talk about an inspire device today -Indications, what he does, how it helps sleep apnea  Diagnosed with severe obstructive sleep apnea  He does have daytime tiredness Usually goes to bed about 11 PM, takes him about an hour to fall asleep Multiple awakenings up to 6 times at night sometimes Usually tries to get out of bed by 6 AM  Tired during the day,  Works 12-hour shifts  Does have a caffeinated beverage in the morning  He does have siblings with sleep apnea  History of obstructive lung disease, coronary artery disease, hypertension  Outpatient Encounter Medications as of 10/23/2021  Medication Sig   ALPRAZolam (XANAX) 0.25 MG tablet Take 1 tablet (0.25 mg total) by mouth 2 (two) times daily as needed for anxiety.   aspirin EC 81 MG tablet Take 1 tablet (81 mg total) by mouth daily.   atorvastatin (LIPITOR) 40 MG tablet TAKE 1 TABLET BY MOUTH ONCE A DAY.   clopidogrel (PLAVIX) 75 MG tablet TAKE (1) TABLET BY MOUTH ONCE DAILY.   diclofenac Sodium (VOLTAREN) 1 % GEL Apply 1 application topically 3 (three) times daily as needed (leg cramps/spasms).   famotidine (PEPCID) 20 MG tablet TAKE 1 TABLET BY MOUTH ONCE DAILY.   FLUoxetine (PROZAC) 40 MG capsule TAKE 1 CAPSULE(40 MG) BY MOUTH DAILY   FLUZONE HIGH-DOSE QUADRIVALENT 0.7 ML SUSY     irbesartan (AVAPRO) 300 MG tablet TAKE 1 TABLET BY MOUTH ONCE A DAY.   nitroGLYCERIN (NITROSTAT) 0.4 MG SL tablet Place 0.4 mg under the tongue every 5 (five) minutes as needed for chest pain.   PFIZER COVID-19 VAC BIVALENT injection    tamsulosin (FLOMAX) 0.4 MG CAPS capsule Take 1 capsule (0.4 mg total) by mouth daily.   Facility-Administered Encounter Medications as of 10/23/2021  Medication   sodium chloride flush (NS) 0.9 % injection 3 mL    Allergies as of 10/23/2021   (No Known Allergies)    Past Medical History:  Diagnosis Date   Bell's palsy    Cataract    Mixed form OU   Closed nondisplaced fracture of neck of third metacarpal bone of right hand 02/04/2017   Clotting disorder ([place])    Depression    Diverticulitis    GERD (gastroesophageal reflux disease)    Headache(784.0)    HTN (hypertension)    Hyperlipidemia    Hypertensive retinopathy    OU   Mild CAD 2012   PVD (peripheral vascular disease) ([place])    Retinal detachment    Sexual dysfunction    Sleep apnea     Past Surgical History:  Procedure Laterality Date   ABDOMINAL AORTOGRAM W/LOWER EXTREMITY Bilateral 02/05/2020   Procedure: ABDOMINAL AORTOGRAM W/LOWER EXTREMITY;  Surgeon: Elam Dutch, MD;  Location: [facility]  INVASIVE CV LAB;  Service: Cardiovascular;  Laterality: Bilateral;   ABDOMINAL AORTOGRAM W/LOWER EXTREMITY N/A 12/16/2020   Procedure: ABDOMINAL AORTOGRAM W/LOWER EXTREMITY;  Surgeon: Elam Dutch, MD;  Location: [facility];  Service: Cardiovascular;  Laterality: N/A;   CATARACT EXTRACTION     EYE SURGERY Right 05/06/2020   Pneumatic retinopexy for rheg. RD repair - Dr. Bernarda Caffey   EYE SURGERY Right 05/26/2020   PPV/MP - Dr. Bernarda Caffey   GAS INSERTION Right 05/26/2020   Procedure: INSERTION OF GAS;  Surgeon: Bernarda Caffey, MD;  Location: [place];  Service: Ophthalmology;  Laterality: Right;   INGUINAL HERNIA REPAIR Right 05/29/2021   Procedure: RIGHT INGUINAL HERNIA REPAIR  WITH MESH;  Surgeon: Jovita Kussmaul, MD;  Location: [facility];  Service: General;  Laterality: Right;  TAP BLOCK   LEFT HEART CATH AND CORONARY ANGIOGRAPHY N/A 04/05/2021   Procedure: LEFT HEART CATH AND CORONARY ANGIOGRAPHY;  Surgeon: Burnell Blanks, MD;  Location: [facility] CV [facility];  Service: Cardiovascular;  Laterality: N/A;   MEMBRANE PEEL Right 05/26/2020   Procedure: MEMBRANE PEEL;  Surgeon: Bernarda Caffey, MD;  Location: [facility];  Service: Ophthalmology;  Laterality: Right;   PARS PLANA VITRECTOMY Right 05/26/2020   Procedure: PARS PLANA VITRECTOMY WITH 25 GAUGE;  Surgeon: Bernarda Caffey, MD;  Location: [facility];  Service: Ophthalmology;  Laterality: Right;   PERIPHERAL VASCULAR INTERVENTION Right 12/16/2020   Procedure: PERIPHERAL VASCULAR INTERVENTION;  Surgeon: Elam Dutch, MD;  Location: [facility] CV LAB;  Service: Cardiovascular;  Laterality: Right;  SFA (distal)   PHOTOCOAGULATION Right 05/26/2020   Procedure: PHOTOCOAGULATION;  Surgeon: Bernarda Caffey, MD;  Location: [facility];  Service: Ophthalmology;  Laterality: Right;   PROSTATE BIOPSY  2015   RETINAL DETACHMENT SURGERY Right 05/06/2020   Pneumatic retinopexy for rheg RD repair - Dr. Bernarda Caffey   RETINAL DETACHMENT SURGERY Right 05/26/2020   PPV/MP - Dr. Bernarda Caffey    Family History  Problem Relation Age of Onset   Hypertension Mother    Deep vein thrombosis Father    Diabetes Brother    Tuberculosis Maternal Grandfather    Emphysema Maternal Grandfather    Lung disease Brother    Asthma Son    Rectal cancer Neg Hx    Colon cancer Neg Hx     Social History   Socioeconomic History   Marital status: Single    Spouse name: Not on file   Number of children: 2   Years of education: 12   Highest education level: Not on file  Occupational History   Occupation: Electrical engineer   Occupation: Unemployed    Comment: disability  Tobacco Use   Smoking status: Former    Packs/day: 1.50     Years: 46.00    Pack years: 69.00    Types: Cigarettes    Quit date: 12/12/2010    Years since quitting: 10.8   Smokeless tobacco: Never  Vaping Use   Vaping Use: Never used  Substance and Sexual Activity   Alcohol use: Yes    Alcohol/week: 4.0 standard drinks    Types: 4 Cans of beer per week    Comment: a couple of beer on a weekend   Drug use: Not Currently    Types: Cocaine, Marijuana, "Crack" cocaine    Comment: over 25 years ago   Sexual activity: Not on file  Other Topics Concern   Not on file  Social History Narrative   Raised in  [place], [place]. Does not have any religious beliefs that would effect healthcare. Lives in house with sister. Likes to ride motorcycle for fun.    Moving out on his own; in the next couple of weeks will be moving back home that he had rented      [employer]; with a helmet   Social Determinants of Health   Financial Resource Strain: Low Risk    Difficulty of Paying Living Expenses: Not hard at all  Food Insecurity: No Food Insecurity   Worried About Charity fundraiser in the Last Year: Never true   [place] in the Last Year: Never true  Transportation Needs: No Transportation Needs   Lack of Transportation (Medical): No   Lack of Transportation (Non-Medical): No  Physical Activity: Inactive   Days of Exercise per Week: 0 days   Minutes of Exercise per Session: 0 min  Stress: Stress Concern Present   Feeling of Stress : To some extent  Social Connections: Socially Isolated   Frequency of Communication with Friends and Family: More than three times a week   Frequency of Social Gatherings with Friends and Family: Once a week   Attends Religious Services: Never   Marine scientist or Organizations: No   Attends Music therapist: Never   Marital Status: Never married  Human resources officer Violence: Not At Risk   Fear of Current or Ex-Partner: No   Emotionally Abused: No   Physically Abused: No   Sexually Abused: No     Review of Systems  Constitutional:  Positive for fatigue.  Psychiatric/Behavioral:  Positive for sleep disturbance.    Vitals:   10/23/21 1115  BP: 116/78  Pulse: 66  Temp: 98.3 F (36.8 C)  SpO2: 100%     Physical Exam Constitutional:      Appearance: He is obese.  HENT:     Head: Normocephalic.     Nose: Nose normal. No congestion.     Mouth/Throat:     Mouth: Mucous membranes are moist.     Comments: Mallampati 4, macroglossia Eyes:     Pupils: Pupils are equal, round, and reactive to light.  Cardiovascular:     Rate and Rhythm: Normal rate and regular rhythm.     Heart sounds: No murmur heard.   No friction rub.  Pulmonary:     Effort: No respiratory distress.     Breath sounds: No stridor. No wheezing or rhonchi.  Musculoskeletal:     Cervical back: No rigidity or tenderness.  Neurological:     Mental Status: He is alert.  Psychiatric:        Mood and Affect: Mood normal.    Data Reviewed: Sleep study reviewed showing severe obstructive sleep apnea with an AHI of 49, oxygen nadir of 75%  No CPAP compliance available  Assessment:  Severe obstructive sleep apnea -Intolerant to CPAP -Has been trying CPAP for the last 5 months -Has had several mask changes without improvement or improvement intolerance  Sleep quality is much worse with CPAP therapy and is at a point where he feels it is not going to help him  He is concerned about the sleep apnea and the risks which we discussed today  Risk to his heart and his brain regarding untreated sleep apnea discussed  Did discuss the inspire device as an option of treatment -Indication and benefits  History of hypertension History of coronary artery disease  Reformed smoker -History of emphysema  Obesity  Plan/Recommendations:  Referral to ENT for evaluation for an inspire device  Encouraged to continue weight loss efforts  Encouraged to call with significant concerns  Tentative follow-up in  about 6 months  Encouraged to continue follow-up with Dr. Erin Fulling for his known lung disease  Sherrilyn Rist MD [facility] 10/23/2021, 11:49 AM  CC: Vivi Barrack, MD

## 2021-10-23 NOTE — Addendum Note (Signed)
Addended by: Dessie Coma on: 10/23/2021 12:00 PM ? ? Modules accepted: Orders ? ?

## 2021-10-23 NOTE — Patient Instructions (Signed)
Referral for evaluation for severe obstructive sleep apnea-intolerant of CPAP ?-Dr. Jonette Eva ? ?Continue weight loss efforts ? ?I will see you back in 6 months ? ?Continue follow-up with Dr. Erin Fulling ? ?Call with significant concerns ?

## 2021-11-06 ENCOUNTER — Ambulatory Visit: Payer: PPO | Admitting: Pulmonary Disease

## 2021-11-28 NOTE — Progress Notes (Shared)
?[facility] Note ? ?12/04/2021 ? ?  ? ?CHIEF COMPLAINT ?Patient presents for No chief complaint on file. ? ? ? ?HISTORY OF PRESENT ILLNESS: ?Todd Weeks is a 66 y.o. male who presents to the clinic today for:  ? ?Pt feels like his eyes are still not working together, which is making his depth perception off, his right eye still has "waviness" in it, he says glasses help "somewhat" ? ? ?Referring physician: ?Vivi Barrack, MD ?[address] ?[place],  [place] [address] ? ?HISTORICAL INFORMATION:  ? ?Selected notes from the MEDICAL RECORD NUMBER ?  ? ?CURRENT MEDICATIONS: ?No current outpatient medications on file. (Ophthalmic Drugs)  ? ?No current facility-administered medications for this visit. (Ophthalmic Drugs)  ? ?Current Outpatient Medications (Other)  ?Medication Sig  ? ALPRAZolam (XANAX) 0.25 MG tablet Take 1 tablet (0.25 mg total) by mouth 2 (two) times daily as needed for anxiety.  ? aspirin EC 81 MG tablet Take 1 tablet (81 mg total) by mouth daily.  ? atorvastatin (LIPITOR) 40 MG tablet TAKE 1 TABLET BY MOUTH ONCE A DAY.  ? clopidogrel (PLAVIX) 75 MG tablet TAKE (1) TABLET BY MOUTH ONCE DAILY.  ? diclofenac Sodium (VOLTAREN) 1 % GEL Apply 1 application topically 3 (three) times daily as needed (leg cramps/spasms).  ? famotidine (PEPCID) 20 MG tablet TAKE 1 TABLET BY MOUTH ONCE DAILY.  ? FLUoxetine (PROZAC) 40 MG capsule TAKE 1 CAPSULE(40 MG) BY MOUTH DAILY  ? FLUZONE HIGH-DOSE QUADRIVALENT 0.7 ML SUSY   ? irbesartan (AVAPRO) 300 MG tablet TAKE 1 TABLET BY MOUTH ONCE A DAY.  ? nitroGLYCERIN (NITROSTAT) 0.4 MG SL tablet Place 0.4 mg under the tongue every 5 (five) minutes as needed for chest pain.  ? PFIZER COVID-19 VAC BIVALENT injection   ? tamsulosin (FLOMAX) 0.4 MG CAPS capsule Take 1 capsule (0.4 mg total) by mouth daily.  ? ?Current Facility-Administered Medications (Other)  ?Medication Route  ? sodium chloride flush (NS) 0.9 % injection 3 mL Intravenous  ? ?REVIEW OF  SYSTEMS: ? ? ?ALLERGIES ?No Known Allergies ? ?PAST MEDICAL HISTORY ?Past Medical History:  ?Diagnosis Date  ? Bell's palsy   ? Cataract   ? Mixed form OU  ? Closed nondisplaced fracture of neck of third metacarpal bone of right hand 02/04/2017  ? Clotting disorder ([place])   ? Depression   ? Diverticulitis   ? GERD (gastroesophageal reflux disease)   ? Headache(784.0)   ? HTN (hypertension)   ? Hyperlipidemia   ? Hypertensive retinopathy   ? OU  ? Mild CAD 2012  ? PVD (peripheral vascular disease) ([place])   ? Retinal detachment   ? Sexual dysfunction   ? Sleep apnea   ? ?Past Surgical History:  ?Procedure Laterality Date  ? ABDOMINAL AORTOGRAM W/LOWER EXTREMITY Bilateral 02/05/2020  ? Procedure: ABDOMINAL AORTOGRAM W/LOWER EXTREMITY;  Surgeon: Elam Dutch, MD;  Location: [place] CV [facility];  Service: Cardiovascular;  Laterality: Bilateral;  ? ABDOMINAL AORTOGRAM W/LOWER EXTREMITY N/A 12/16/2020  ? Procedure: ABDOMINAL AORTOGRAM W/LOWER EXTREMITY;  Surgeon: Elam Dutch, MD;  Location: [place] [facility];  Service: Cardiovascular;  Laterality: N/A;  ? CATARACT EXTRACTION    ? EYE SURGERY Right 05/06/2020  ? Pneumatic retinopexy for rheg. RD repair - Dr. Bernarda Caffey  ? EYE SURGERY Right 05/26/2020  ? PPV/MP - Dr. Bernarda Caffey  ? GAS INSERTION Right 05/26/2020  ? Procedure: INSERTION OF GAS;  Surgeon: Bernarda Caffey, MD;  Location:  [facility];  Service: Ophthalmology;  Laterality: Right;  ? INGUINAL HERNIA REPAIR Right 05/29/2021  ? Procedure: RIGHT INGUINAL HERNIA REPAIR WITH MESH;  Surgeon: Jovita Kussmaul, MD;  Location: [facility];  Service: General;  Laterality: Right;  TAP BLOCK  ? LEFT HEART CATH AND CORONARY ANGIOGRAPHY N/A 04/05/2021  ? Procedure: LEFT HEART CATH AND CORONARY ANGIOGRAPHY;  Surgeon: Burnell Blanks, MD;  Location: [place];  Service: Cardiovascular;  Laterality: N/A;  ? MEMBRANE PEEL Right 05/26/2020  ? Procedure: MEMBRANE PEEL;  Surgeon: Bernarda Caffey, MD;   Location: [facility];  Service: Ophthalmology;  Laterality: Right;  ? PARS PLANA VITRECTOMY Right 05/26/2020  ? Procedure: PARS PLANA VITRECTOMY WITH 25 GAUGE;  Surgeon: Bernarda Caffey, MD;  Location: [facility];  Service: Ophthalmology;  Laterality: Right;  ? PERIPHERAL VASCULAR INTERVENTION Right 12/16/2020  ? Procedure: PERIPHERAL VASCULAR INTERVENTION;  Surgeon: Elam Dutch, MD;  Location: [facility];  Service: Cardiovascular;  Laterality: Right;  SFA (distal)  ? PHOTOCOAGULATION Right 05/26/2020  ? Procedure: PHOTOCOAGULATION;  Surgeon: Bernarda Caffey, MD;  Location: [facility];  Service: Ophthalmology;  Laterality: Right;  ? PROSTATE BIOPSY  2015  ? RETINAL DETACHMENT SURGERY Right 05/06/2020  ? Pneumatic retinopexy for rheg RD repair - Dr. Bernarda Caffey  ? RETINAL DETACHMENT SURGERY Right 05/26/2020  ? PPV/MP - Dr. Bernarda Caffey  ? ? ?FAMILY HISTORY ?Family History  ?Problem Relation Age of Onset  ? Hypertension Mother   ? Deep vein thrombosis Father   ? Diabetes Brother   ? Tuberculosis Maternal Grandfather   ? Emphysema Maternal Grandfather   ? Lung disease Brother   ? Asthma Son   ? Rectal cancer Neg Hx   ? Colon cancer Neg Hx   ? ? ?SOCIAL HISTORY ?Social History  ? ?Tobacco Use  ? Smoking status: Former  ?  Packs/day: 1.50  ?  Years: 46.00  ?  Pack years: 69.00  ?  Types: Cigarettes  ?  Quit date: 12/12/2010  ?  Years since quitting: 10.9  ? Smokeless tobacco: Never  ?Vaping Use  ? Vaping Use: Never used  ?Substance Use Topics  ? Alcohol use: Yes  ?  Alcohol/week: 4.0 standard drinks  ?  Types: 4 Cans of beer per week  ?  Comment: a couple of beer on a weekend  ? Drug use: Not Currently  ?  Types: Cocaine, Marijuana, "Crack" cocaine  ?  Comment: over 25 years ago  ?  ? ?  ?OPHTHALMIC EXAM: ? ?Not recorded ?  ? ? ?IMAGING AND PROCEDURES  ?Imaging and Procedures for 12/04/2021 ? ? ?  ?  ? ?  ?ASSESSMENT/PLAN: ? ?  ICD-10-CM   ?1. Right retinal detachment  H33.21   ?  ?2. Epiretinal membrane (ERM) of right eye   H35.371   ?  ?3. Retinal tear of left eye  H33.312   ?  ?4. Essential hypertension  I10   ?  ?5. Hypertensive retinopathy of both eyes  H35.033   ?  ?6. Combined forms of age-related cataract of left eye  H25.812   ?  ?7. Pseudophakia  Z96.1   ?  ?8. Combined forms of age-related cataract of both eyes  H25.813   ?  ? ? ?1,2. Rhegmatogenous retinal detachment, OD ?- bullous superior mac off detachment, onset of foveal involvement [place], 09.23.21 by pt history ?- detached temporally from 0730 to 11 oclock, fovea off, horseshoe tear at 1030 ?- s/p  pneumatic retinopexy w/ C3F8 OD 09.24.21 ?- supplemental intravitreal C3F8 gas injection OD (2.5cc on 10.06.21) ?**persistent shallow SRF remained due to ERM preventing full reattachment** ? - s/p PPV/TissueBlue stain/MP/EL/FAX/14% C3F8 OD, 10.14.2021 ? - exam and OCT show trace, focal SRF nasal macula -- all other pockets of SRF resolved ? - now BCVA 20/25! ?            - IOP good at 14 ?- f/u 6-9 months, POV, DFE, OCT ?  ?3. Epiretinal membrane, right eye  ?- +ERM w/ pucker likely impacting RD and SRF OD ? - s/p PPV/TissueBlue stain/MP/EL/FAX/14% C3F8 OD, 10.14.2021 as above ?- ERM gone ?- OCT shows ERM gone and retinal thickening improved ?- monitor ? ?4. Retinal tear, OS ?- horse shoe tear located at 0130 ?- s/p laser retinopexy OS 09.24.21 ?- good laser changes in place ?- no new RT/RD ? ?5,6. Hypertensive retinopathy OU ?- discussed importance of tight BP control ?- monitor ? ?7. Mixed Cataract OS ?- The symptoms of cataract, surgical options, and treatments and risks were discussed with patient. ?- discussed diagnosis and progression ? ?8. Pseudophakia OD ? - s/p CE/IOL OD (Dr. Brigitte Pulse, 02.07.22) ? - IOL in good position, doing well ?- monitor ? ?Ophthalmic Meds Ordered this visit:  ?No orders of the defined types were placed in this encounter. ? ? ?  ? ?No follow-ups on file. ? ?There are no Patient Instructions on file for this visit. ? ?This document serves as a  record of services personally performed by Gardiner Sleeper, MD, PhD. It was created on their behalf by Roselee Nova, COMT. The creation of this record is the provider's dictation and/or activities during the visit. ?

## 2021-12-04 ENCOUNTER — Other Ambulatory Visit: Payer: Self-pay | Admitting: Family Medicine

## 2021-12-04 ENCOUNTER — Encounter (INDEPENDENT_AMBULATORY_CARE_PROVIDER_SITE_OTHER): Payer: PPO | Admitting: Ophthalmology

## 2021-12-04 DIAGNOSIS — I2510 Atherosclerotic heart disease of native coronary artery without angina pectoris: Secondary | ICD-10-CM

## 2021-12-04 DIAGNOSIS — K219 Gastro-esophageal reflux disease without esophagitis: Secondary | ICD-10-CM

## 2021-12-18 ENCOUNTER — Other Ambulatory Visit: Payer: Self-pay | Admitting: Family Medicine

## 2021-12-20 NOTE — Progress Notes (Shared)
?[facility] Note ? ?12/25/2021 ? ?  ? ?CHIEF COMPLAINT ?Patient presents for No chief complaint on file. ? ? ? ?HISTORY OF PRESENT ILLNESS: ?Todd Weeks is a 66 y.o. male who presents to the clinic today for:  ? ?Pt feels like his eyes are still not working together, which is making his depth perception off, his right eye still has "waviness" in it, he says glasses help "somewhat" ? ? ?Referring physician: ?Vivi Barrack, MD ?[address] ?[place],  [place] [address] ? ?HISTORICAL INFORMATION:  ? ?Selected notes from the MEDICAL RECORD NUMBER ?  ? ?CURRENT MEDICATIONS: ?No current outpatient medications on file. (Ophthalmic Drugs)  ? ?No current facility-administered medications for this visit. (Ophthalmic Drugs)  ? ?Current Outpatient Medications (Other)  ?Medication Sig  ? ALPRAZolam (XANAX) 0.25 MG tablet Take 1 tablet (0.25 mg total) by mouth 2 (two) times daily as needed for anxiety.  ? aspirin EC 81 MG tablet Take 1 tablet (81 mg total) by mouth daily.  ? atorvastatin (LIPITOR) 40 MG tablet TAKE 1 TABLET BY MOUTH ONCE A DAY.  ? clopidogrel (PLAVIX) 75 MG tablet TAKE (1) TABLET BY MOUTH ONCE DAILY.  ? diclofenac Sodium (VOLTAREN) 1 % GEL Apply 1 application topically 3 (three) times daily as needed (leg cramps/spasms).  ? famotidine (PEPCID) 20 MG tablet TAKE 1 TABLET BY MOUTH ONCE DAILY.  ? FLUoxetine (PROZAC) 40 MG capsule TAKE 1 CAPSULE(40 MG) BY MOUTH DAILY  ? FLUZONE HIGH-DOSE QUADRIVALENT 0.7 ML SUSY   ? irbesartan (AVAPRO) 300 MG tablet TAKE 1 TABLET BY MOUTH ONCE A DAY.  ? nitroGLYCERIN (NITROSTAT) 0.4 MG SL tablet Place 0.4 mg under the tongue every 5 (five) minutes as needed for chest pain.  ? PFIZER COVID-19 VAC BIVALENT injection   ? tamsulosin (FLOMAX) 0.4 MG CAPS capsule Take 1 capsule (0.4 mg total) by mouth daily.  ? ?Current Facility-Administered Medications (Other)  ?Medication Route  ? sodium chloride flush (NS) 0.9 % injection 3 mL Intravenous  ? ?REVIEW OF  SYSTEMS: ? ? ?ALLERGIES ?No Known Allergies ? ?PAST MEDICAL HISTORY ?Past Medical History:  ?Diagnosis Date  ? Bell's palsy   ? Cataract   ? Mixed form OU  ? Closed nondisplaced fracture of neck of third metacarpal bone of right hand 02/04/2017  ? Clotting disorder ([place])   ? Depression   ? Diverticulitis   ? GERD (gastroesophageal reflux disease)   ? Headache(784.0)   ? HTN (hypertension)   ? Hyperlipidemia   ? Hypertensive retinopathy   ? OU  ? Mild CAD 2012  ? PVD (peripheral vascular disease) ([place])   ? Retinal detachment   ? Sexual dysfunction   ? Sleep apnea   ? ?Past Surgical History:  ?Procedure Laterality Date  ? ABDOMINAL AORTOGRAM W/LOWER EXTREMITY Bilateral 02/05/2020  ? Procedure: ABDOMINAL AORTOGRAM W/LOWER EXTREMITY;  Surgeon: Elam Dutch, MD;  Location: [facility];  Service: Cardiovascular;  Laterality: Bilateral;  ? ABDOMINAL AORTOGRAM W/LOWER EXTREMITY N/A 12/16/2020  ? Procedure: ABDOMINAL AORTOGRAM W/LOWER EXTREMITY;  Surgeon: Elam Dutch, MD;  Location: [facility];  Service: Cardiovascular;  Laterality: N/A;  ? CATARACT EXTRACTION    ? EYE SURGERY Right 05/06/2020  ? Pneumatic retinopexy for rheg. RD repair - Dr. Bernarda Caffey  ? EYE SURGERY Right 05/26/2020  ? PPV/MP - Dr. Bernarda Caffey  ? GAS INSERTION Right 05/26/2020  ? Procedure: INSERTION OF GAS;  Surgeon: Bernarda Caffey, MD;  Location:  [facility];  Service: Ophthalmology;  Laterality: Right;  ? INGUINAL HERNIA REPAIR Right 05/29/2021  ? Procedure: RIGHT INGUINAL HERNIA REPAIR WITH MESH;  Surgeon: Jovita Kussmaul, MD;  Location: [facility];  Service: General;  Laterality: Right;  TAP BLOCK  ? LEFT HEART CATH AND CORONARY ANGIOGRAPHY N/A 04/05/2021  ? Procedure: LEFT HEART CATH AND CORONARY ANGIOGRAPHY;  Surgeon: Burnell Blanks, MD;  Location: [facility];  Service: Cardiovascular;  Laterality: N/A;  ? MEMBRANE PEEL Right 05/26/2020  ? Procedure: MEMBRANE PEEL;  Surgeon: Bernarda Caffey, MD;   Location: [facility];  Service: Ophthalmology;  Laterality: Right;  ? PARS PLANA VITRECTOMY Right 05/26/2020  ? Procedure: PARS PLANA VITRECTOMY WITH 25 GAUGE;  Surgeon: Bernarda Caffey, MD;  Location: [facility];  Service: Ophthalmology;  Laterality: Right;  ? PERIPHERAL VASCULAR INTERVENTION Right 12/16/2020  ? Procedure: PERIPHERAL VASCULAR INTERVENTION;  Surgeon: Elam Dutch, MD;  Location: [facility];  Service: Cardiovascular;  Laterality: Right;  SFA (distal)  ? PHOTOCOAGULATION Right 05/26/2020  ? Procedure: PHOTOCOAGULATION;  Surgeon: Bernarda Caffey, MD;  Location: [facility];  Service: Ophthalmology;  Laterality: Right;  ? PROSTATE BIOPSY  2015  ? RETINAL DETACHMENT SURGERY Right 05/06/2020  ? Pneumatic retinopexy for rheg RD repair - Dr. Bernarda Caffey  ? RETINAL DETACHMENT SURGERY Right 05/26/2020  ? PPV/MP - Dr. Bernarda Caffey  ? ? ?FAMILY HISTORY ?Family History  ?Problem Relation Age of Onset  ? Hypertension Mother   ? Deep vein thrombosis Father   ? Diabetes Brother   ? Tuberculosis Maternal Grandfather   ? Emphysema Maternal Grandfather   ? Lung disease Brother   ? Asthma Son   ? Rectal cancer Neg Hx   ? Colon cancer Neg Hx   ? ? ?SOCIAL HISTORY ?Social History  ? ?Tobacco Use  ? Smoking status: Former  ?  Packs/day: 1.50  ?  Years: 46.00  ?  Pack years: 69.00  ?  Types: Cigarettes  ?  Quit date: 12/12/2010  ?  Years since quitting: 11.0  ? Smokeless tobacco: Never  ?Vaping Use  ? Vaping Use: Never used  ?Substance Use Topics  ? Alcohol use: Yes  ?  Alcohol/week: 4.0 standard drinks  ?  Types: 4 Cans of beer per week  ?  Comment: a couple of beer on a weekend  ? Drug use: Not Currently  ?  Types: Cocaine, Marijuana, "Crack" cocaine  ?  Comment: over 25 years ago  ?  ? ?  ?OPHTHALMIC EXAM: ? ?Not recorded ?  ? ? ?IMAGING AND PROCEDURES  ?Imaging and Procedures for 12/25/2021 ? ? ?  ?  ? ?  ?ASSESSMENT/PLAN: ? ?  ICD-10-CM   ?1. Right retinal detachment  H33.21   ?  ?2. Epiretinal membrane (ERM) of right eye   H35.371   ?  ?3. Retinal tear of left eye  H33.312   ?  ?4. Essential hypertension  I10   ?  ?5. Hypertensive retinopathy of both eyes  H35.033   ?  ?6. Combined forms of age-related cataract of left eye  H25.812   ?  ?7. Pseudophakia  Z96.1   ?  ? ? ?1. Rhegmatogenous retinal detachment, OD ?- bullous superior mac off detachment, onset of foveal involvement Thurday, 09.23.21 by pt history ?- detached temporally from 0730 to 11 oclock, fovea off, horseshoe tear at 1030 ?- s/p pneumatic retinopexy w/ C3F8 OD 09.24.21 ?- supplemental intravitreal C3F8 gas injection OD (2.5cc on  10.06.21) ?**persistent shallow SRF remained due to ERM preventing full reattachment** ? - s/p PPV/TissueBlue stain/MP/EL/FAX/14% C3F8 OD, 10.14.2021 ? - exam and OCT show trace, focal SRF nasal macula -- all other pockets of SRF resolved ? - now BCVA 20/25! ?            - IOP good at 14 ?- f/u 6-9 months, POV, DFE, OCT ?  ?2. Epiretinal membrane, right eye  ?- +ERM w/ pucker likely impacting RD and SRF OD ? - s/p PPV/TissueBlue stain/MP/EL/FAX/14% C3F8 OD, 10.14.2021 as above ?- ERM gone ?- OCT shows ERM gone and retinal thickening improved ?- monitor ? ?3. Retinal tear, OS ?- horse shoe tear located at 0130 ?- s/p laser retinopexy OS 09.24.21 ?- good laser changes in place ?- no new RT/RD ? ?4,5. Hypertensive retinopathy OU ?- discussed importance of tight BP control ?- monitor ? ?6. Mixed Cataract OS ?- The symptoms of cataract, surgical options, and treatments and risks were discussed with patient. ?- discussed diagnosis and progression ? ?7. Pseudophakia OD ? - s/p CE/IOL OD (Dr. Brigitte Pulse, 02.07.22) ? - IOL in good position, doing well ?- monitor ? ?Ophthalmic Meds Ordered this visit:  ?No orders of the defined types were placed in this encounter. ? ? ?  ? ?No follow-ups on file. ? ?There are no Patient Instructions on file for this visit. ? ?This document serves as a record of services personally performed by Gardiner Sleeper, MD, PhD. It was  created on their behalf by Leeann Must, [place], an ophthalmic technician. The creation of this record is the provider's dictation and/or activities during the visit.   ? ?Electronically signed by: Leeann Must,

## 2021-12-25 ENCOUNTER — Encounter (INDEPENDENT_AMBULATORY_CARE_PROVIDER_SITE_OTHER): Payer: PPO | Admitting: Ophthalmology

## 2021-12-25 ENCOUNTER — Encounter (INDEPENDENT_AMBULATORY_CARE_PROVIDER_SITE_OTHER): Payer: Self-pay

## 2021-12-25 DIAGNOSIS — I10 Essential (primary) hypertension: Secondary | ICD-10-CM

## 2021-12-25 DIAGNOSIS — H35371 Puckering of macula, right eye: Secondary | ICD-10-CM

## 2021-12-25 DIAGNOSIS — H3321 Serous retinal detachment, right eye: Secondary | ICD-10-CM

## 2021-12-25 DIAGNOSIS — Z961 Presence of intraocular lens: Secondary | ICD-10-CM

## 2021-12-25 DIAGNOSIS — H35033 Hypertensive retinopathy, bilateral: Secondary | ICD-10-CM

## 2021-12-25 DIAGNOSIS — H25812 Combined forms of age-related cataract, left eye: Secondary | ICD-10-CM

## 2021-12-25 DIAGNOSIS — H33312 Horseshoe tear of retina without detachment, left eye: Secondary | ICD-10-CM

## 2021-12-28 NOTE — Progress Notes (Shared)
[facility] Note  01/01/2022     CHIEF COMPLAINT Patient presents for No chief complaint on file.    HISTORY OF PRESENT ILLNESS: Todd Weeks is a 66 y.o. male who presents to the clinic today for:   Pt feels like his eyes are still not working together, which is making his depth perception off, his right eye still has "waviness" in it, he says glasses help "somewhat"   Referring physician: Vivi Barrack, MD [place],  [place] [address]  HISTORICAL INFORMATION:   Selected notes from the [facility]: No current outpatient medications on file. (Ophthalmic Drugs)   No current facility-administered medications for this visit. (Ophthalmic Drugs)   Current Outpatient Medications (Other)  Medication Sig   ALPRAZolam (XANAX) 0.25 MG tablet Take 1 tablet (0.25 mg total) by mouth 2 (two) times daily as needed for anxiety.   aspirin EC 81 MG tablet Take 1 tablet (81 mg total) by mouth daily.   atorvastatin (LIPITOR) 40 MG tablet TAKE 1 TABLET BY MOUTH ONCE A DAY.   clopidogrel (PLAVIX) 75 MG tablet TAKE (1) TABLET BY MOUTH ONCE DAILY.   diclofenac Sodium (VOLTAREN) 1 % GEL Apply 1 application topically 3 (three) times daily as needed (leg cramps/spasms).   famotidine (PEPCID) 20 MG tablet TAKE 1 TABLET BY MOUTH ONCE DAILY.   FLUoxetine (PROZAC) 40 MG capsule TAKE 1 CAPSULE(40 MG) BY MOUTH DAILY   FLUZONE HIGH-DOSE QUADRIVALENT 0.7 ML SUSY    irbesartan (AVAPRO) 300 MG tablet TAKE 1 TABLET BY MOUTH ONCE A DAY.   nitroGLYCERIN (NITROSTAT) 0.4 MG SL tablet Place 0.4 mg under the tongue every 5 (five) minutes as needed for chest pain.   PFIZER COVID-19 VAC BIVALENT injection    tamsulosin (FLOMAX) 0.4 MG CAPS capsule Take 1 capsule (0.4 mg total) by mouth daily.   Current Facility-Administered Medications (Other)  Medication Route   sodium chloride flush (NS) 0.9 % injection 3 mL Intravenous   REVIEW OF  SYSTEMS:   ALLERGIES No Known Allergies  PAST MEDICAL HISTORY Past Medical History:  Diagnosis Date   Bell's palsy    Cataract    Mixed form OU   Closed nondisplaced fracture of neck of third metacarpal bone of right hand 02/04/2017   Clotting disorder ([place])    Depression    Diverticulitis    GERD (gastroesophageal reflux disease)    Headache(784.0)    HTN (hypertension)    Hyperlipidemia    Hypertensive retinopathy    OU   Mild CAD 2012   PVD (peripheral vascular disease) ([place])    Retinal detachment    Sexual dysfunction    Sleep apnea    Past Surgical History:  Procedure Laterality Date   ABDOMINAL AORTOGRAM W/LOWER EXTREMITY Bilateral 02/05/2020   Procedure: ABDOMINAL AORTOGRAM W/LOWER EXTREMITY;  Surgeon: Elam Dutch, MD;  Location: [facility];  Service: Cardiovascular;  Laterality: Bilateral;   ABDOMINAL AORTOGRAM W/LOWER EXTREMITY N/A 12/16/2020   Procedure: ABDOMINAL AORTOGRAM W/LOWER EXTREMITY;  Surgeon: Elam Dutch, MD;  Location: [facility];  Service: Cardiovascular;  Laterality: N/A;   CATARACT EXTRACTION     EYE SURGERY Right 05/06/2020   Pneumatic retinopexy for rheg. RD repair - Dr. Bernarda Caffey   EYE SURGERY Right 05/26/2020   PPV/MP - Dr. Bernarda Caffey   GAS INSERTION Right 05/26/2020   Procedure: INSERTION OF GAS;  Surgeon: Bernarda Caffey, MD;  Location:  [facility];  Service: Ophthalmology;  Laterality: Right;   INGUINAL HERNIA REPAIR Right 05/29/2021   Procedure: RIGHT INGUINAL HERNIA REPAIR WITH MESH;  Surgeon: Jovita Kussmaul, MD;  Location: [facility];  Service: General;  Laterality: Right;  TAP BLOCK   LEFT HEART CATH AND CORONARY ANGIOGRAPHY N/A 04/05/2021   Procedure: LEFT HEART CATH AND CORONARY ANGIOGRAPHY;  Surgeon: Burnell Blanks, MD;  Location: [facility];  Service: Cardiovascular;  Laterality: N/A;   MEMBRANE PEEL Right 05/26/2020   Procedure: MEMBRANE PEEL;  Surgeon: Bernarda Caffey, MD;   Location: [facility];  Service: Ophthalmology;  Laterality: Right;   PARS PLANA VITRECTOMY Right 05/26/2020   Procedure: PARS PLANA VITRECTOMY WITH 25 GAUGE;  Surgeon: Bernarda Caffey, MD;  Location: [facility];  Service: Ophthalmology;  Laterality: Right;   PERIPHERAL VASCULAR INTERVENTION Right 12/16/2020   Procedure: PERIPHERAL VASCULAR INTERVENTION;  Surgeon: Elam Dutch, MD;  Location: [facility];  Service: Cardiovascular;  Laterality: Right;  SFA (distal)   PHOTOCOAGULATION Right 05/26/2020   Procedure: PHOTOCOAGULATION;  Surgeon: Bernarda Caffey, MD;  Location: [facility];  Service: Ophthalmology;  Laterality: Right;   PROSTATE BIOPSY  2015   RETINAL DETACHMENT SURGERY Right 05/06/2020   Pneumatic retinopexy for rheg RD repair - Dr. Bernarda Caffey   RETINAL DETACHMENT SURGERY Right 05/26/2020   PPV/MP - Dr. Bernarda Caffey    FAMILY HISTORY Family History  Problem Relation Age of Onset   Hypertension Mother    Deep vein thrombosis Father    Diabetes Brother    Tuberculosis Maternal Grandfather    Emphysema Maternal Grandfather    Lung disease Brother    Asthma Son    Rectal cancer Neg Hx    Colon cancer Neg Hx     SOCIAL HISTORY Social History   Tobacco Use   Smoking status: Former    Packs/day: 1.50    Years: 46.00    Pack years: 69.00    Types: Cigarettes    Quit date: 12/12/2010    Years since quitting: 11.0   Smokeless tobacco: Never  Vaping Use   Vaping Use: Never used  Substance Use Topics   Alcohol use: Yes    Alcohol/week: 4.0 standard drinks    Types: 4 Cans of beer per week    Comment: a couple of beer on a weekend   Drug use: Not Currently    Types: Cocaine, Marijuana, "Crack" cocaine    Comment: over 25 years ago       OPHTHALMIC EXAM:  Not recorded     IMAGING AND PROCEDURES  Imaging and Procedures for 01/01/2022          ASSESSMENT/PLAN:  No diagnosis found.  1. Rhegmatogenous retinal detachment, OD - bullous superior mac off detachment,  onset of foveal involvement Thurday, 09.23.21 by pt history - detached temporally from 0730 to 11 oclock, fovea off, horseshoe tear at 1030 - s/p pneumatic retinopexy w/ C3F8 OD 09.24.21 - supplemental intravitreal C3F8 gas injection OD (2.5cc on 10.06.21) **persistent shallow SRF remained due to ERM preventing full reattachment**  - s/p PPV/TissueBlue stain/MP/EL/FAX/14% C3F8 OD, 10.14.2021  - exam and OCT show trace, focal SRF nasal macula -- all other pockets of SRF resolved  - now BCVA 20/25!             - IOP good at 14 - f/u 6-9 months, POV, DFE, OCT   2. Epiretinal membrane, right eye  - +ERM w/ pucker likely impacting RD and  SRF OD  - s/p PPV/TissueBlue stain/MP/EL/FAX/14% C3F8 OD, 10.14.2021 as above - ERM gone - OCT shows ERM gone and retinal thickening improved - monitor  3. Retinal tear, OS - horse shoe tear located at 0130 - s/p laser retinopexy OS 09.24.21 - good laser changes in place - no new RT/RD  4,5. Hypertensive retinopathy OU - discussed importance of tight BP control - monitor  6. Mixed Cataract OS - The symptoms of cataract, surgical options, and treatments and risks were discussed with patient. - discussed diagnosis and progression  7. Pseudophakia OD  - s/p CE/IOL OD (Dr. Brigitte Pulse, 02.07.22)  - IOL in good position, doing well - monitor  Ophthalmic Meds Ordered this visit:  No orders of the defined types were placed in this encounter.      No follow-ups on file.  There are no Patient Instructions on file for this visit.  This document serves as a record of services personally performed by Gardiner Sleeper, MD, PhD. It was created on their behalf by Roselee Nova, [place]. The creation of this record is the provider's dictation and/or activities during the visit.  Electronically signed by: Roselee Nova, [place] 12/28/21 10:40 AM    Gardiner Sleeper, M.D., Ph.D. Diseases & Surgery of the Retina and Vitreous [facility] 05/08/2021     Abbreviations: M myopia (nearsighted); A astigmatism; H hyperopia (farsighted); P presbyopia; Mrx spectacle prescription;  CTL contact lenses; OD right eye; OS left eye; OU both eyes  XT exotropia; ET esotropia; PEK punctate epithelial keratitis; PEE punctate epithelial erosions; DES dry eye syndrome; MGD meibomian gland dysfunction; ATs artificial tears; PFAT's preservative free artificial tears; [place] nuclear sclerotic cataract; PSC posterior subcapsular cataract; ERM epi-retinal membrane; PVD posterior vitreous detachment; RD retinal detachment; DM diabetes mellitus; DR diabetic retinopathy; NPDR non-proliferative diabetic retinopathy; PDR proliferative diabetic retinopathy; CSME clinically significant macular edema; DME diabetic macular edema; dbh dot blot hemorrhages; CWS cotton wool spot; POAG primary open angle glaucoma; C/D cup-to-disc ratio; HVF humphrey visual field; GVF goldmann visual field; OCT optical coherence tomography; IOP intraocular pressure; BRVO Branch retinal vein occlusion; CRVO central retinal vein occlusion; CRAO central retinal artery occlusion; BRAO branch retinal artery occlusion; RT retinal tear; SB scleral buckle; PPV pars plana vitrectomy; VH Vitreous hemorrhage; PRP panretinal laser photocoagulation; IVK intravitreal kenalog; VMT vitreomacular traction; MH Macular hole;  NVD neovascularization of the disc; NVE neovascularization elsewhere; AREDS age related eye disease study; ARMD age related macular degeneration; POAG primary open angle glaucoma; EBMD epithelial/anterior basement membrane dystrophy; ACIOL anterior chamber intraocular lens; IOL intraocular lens; PCIOL posterior chamber intraocular lens; Phaco/IOL phacoemulsification with intraocular lens placement; [place] photorefractive keratectomy; LASIK laser assisted in situ keratomileusis; HTN hypertension; DM diabetes mellitus; COPD chronic obstructive pulmonary disease

## 2022-01-01 ENCOUNTER — Encounter (INDEPENDENT_AMBULATORY_CARE_PROVIDER_SITE_OTHER): Payer: PPO | Admitting: Ophthalmology

## 2022-01-01 DIAGNOSIS — H35371 Puckering of macula, right eye: Secondary | ICD-10-CM

## 2022-01-01 DIAGNOSIS — I10 Essential (primary) hypertension: Secondary | ICD-10-CM

## 2022-01-01 DIAGNOSIS — H33312 Horseshoe tear of retina without detachment, left eye: Secondary | ICD-10-CM

## 2022-01-01 DIAGNOSIS — H35033 Hypertensive retinopathy, bilateral: Secondary | ICD-10-CM

## 2022-01-01 DIAGNOSIS — H3321 Serous retinal detachment, right eye: Secondary | ICD-10-CM

## 2022-01-01 DIAGNOSIS — H25812 Combined forms of age-related cataract, left eye: Secondary | ICD-10-CM

## 2022-01-01 DIAGNOSIS — Z961 Presence of intraocular lens: Secondary | ICD-10-CM

## 2022-01-15 ENCOUNTER — Ambulatory Visit (INDEPENDENT_AMBULATORY_CARE_PROVIDER_SITE_OTHER): Payer: PPO | Admitting: Ophthalmology

## 2022-01-15 ENCOUNTER — Encounter (INDEPENDENT_AMBULATORY_CARE_PROVIDER_SITE_OTHER): Payer: Self-pay | Admitting: Ophthalmology

## 2022-01-15 DIAGNOSIS — H3321 Serous retinal detachment, right eye: Secondary | ICD-10-CM

## 2022-01-15 DIAGNOSIS — H25812 Combined forms of age-related cataract, left eye: Secondary | ICD-10-CM | POA: Diagnosis not present

## 2022-01-15 DIAGNOSIS — H26491 Other secondary cataract, right eye: Secondary | ICD-10-CM

## 2022-01-15 DIAGNOSIS — Z961 Presence of intraocular lens: Secondary | ICD-10-CM | POA: Diagnosis not present

## 2022-01-15 DIAGNOSIS — H35371 Puckering of macula, right eye: Secondary | ICD-10-CM

## 2022-01-15 DIAGNOSIS — H35033 Hypertensive retinopathy, bilateral: Secondary | ICD-10-CM | POA: Diagnosis not present

## 2022-01-15 DIAGNOSIS — I10 Essential (primary) hypertension: Secondary | ICD-10-CM

## 2022-01-15 DIAGNOSIS — H33312 Horseshoe tear of retina without detachment, left eye: Secondary | ICD-10-CM

## 2022-01-15 NOTE — Progress Notes (Signed)
[facility] Note  01/15/2022     CHIEF COMPLAINT Patient presents for Retina Follow Up    HISTORY OF PRESENT ILLNESS: Todd Weeks is a 66 y.o. male who presents to the clinic today for:  HPI     Retina Follow Up   Patient presents with  Retinal Break/Detachment.  In right eye.  Severity is moderate.  Duration of 9 months.  Since onset it is gradually worsening.  I, the attending physician,  performed the HPI with the patient and updated documentation appropriately.        Comments   Pt here for ret f/u RD OD. Pt states he feels VA in OD is lousy, getting worse. NVA and DVA feels worse.       Last edited by Bernarda Caffey, MD on 01/18/2022  2:28 PM.    Some scratchiness in the right eye.  Eyes are more tired than before.    Referring physician: Vivi Barrack, [address],  [place] [address]  HISTORICAL INFORMATION:   Selected notes from the [facility]: No current outpatient medications on file. (Ophthalmic Drugs)   No current facility-administered medications for this visit. (Ophthalmic Drugs)   Current Outpatient Medications (Other)  Medication Sig   ALPRAZolam (XANAX) 0.25 MG tablet Take 1 tablet (0.25 mg total) by mouth 2 (two) times daily as needed for anxiety.   aspirin EC 81 MG tablet Take 1 tablet (81 mg total) by mouth daily.   atorvastatin (LIPITOR) 40 MG tablet TAKE 1 TABLET BY MOUTH ONCE A DAY.   clopidogrel (PLAVIX) 75 MG tablet TAKE (1) TABLET BY MOUTH ONCE DAILY.   diclofenac Sodium (VOLTAREN) 1 % GEL Apply 1 application topically 3 (three) times daily as needed (leg cramps/spasms).   famotidine (PEPCID) 20 MG tablet TAKE 1 TABLET BY MOUTH ONCE DAILY.   FLUoxetine (PROZAC) 40 MG capsule TAKE 1 CAPSULE(40 MG) BY MOUTH DAILY   FLUZONE HIGH-DOSE QUADRIVALENT 0.7 ML SUSY    irbesartan (AVAPRO) 300 MG tablet TAKE 1 TABLET BY MOUTH ONCE A DAY.   nitroGLYCERIN (NITROSTAT) 0.4 MG SL  tablet Place 0.4 mg under the tongue every 5 (five) minutes as needed for chest pain.   PFIZER COVID-19 VAC BIVALENT injection    tamsulosin (FLOMAX) 0.4 MG CAPS capsule Take 1 capsule (0.4 mg total) by mouth daily.   Current Facility-Administered Medications (Other)  Medication Route   sodium chloride flush (NS) 0.9 % injection 3 mL Intravenous   REVIEW OF SYSTEMS: ROS   Positive for: Gastrointestinal, Neurological, Cardiovascular, Eyes Negative for: Constitutional, Skin, Genitourinary, Musculoskeletal, HENT, Endocrine, Respiratory, Psychiatric, Allergic/Imm, Heme/Lymph Last edited by Kingsley Spittle, COT on 01/15/2022  1:59 PM.     ALLERGIES No Known Allergies  PAST MEDICAL HISTORY Past Medical History:  Diagnosis Date   Bell's palsy    Cataract    Mixed form OU   Closed nondisplaced fracture of neck of third metacarpal bone of right hand 02/04/2017   Clotting disorder ([place])    Depression    Diverticulitis    GERD (gastroesophageal reflux disease)    Headache(784.0)    HTN (hypertension)    Hyperlipidemia    Hypertensive retinopathy    OU   Mild CAD 2012   PVD (peripheral vascular disease) ([place])    Retinal detachment    Sexual dysfunction    Sleep apnea    Past Surgical History:  Procedure Laterality Date  ABDOMINAL AORTOGRAM W/LOWER EXTREMITY Bilateral 02/05/2020   Procedure: ABDOMINAL AORTOGRAM W/LOWER EXTREMITY;  Surgeon: Elam Dutch, MD;  Location: [facility];  Service: Cardiovascular;  Laterality: Bilateral;   ABDOMINAL AORTOGRAM W/LOWER EXTREMITY N/A 12/16/2020   Procedure: ABDOMINAL AORTOGRAM W/LOWER EXTREMITY;  Surgeon: Elam Dutch, MD;  Location: [facility];  Service: Cardiovascular;  Laterality: N/A;   CATARACT EXTRACTION     EYE SURGERY Right 05/06/2020   Pneumatic retinopexy for rheg. RD repair - Dr. Bernarda Caffey   EYE SURGERY Right 05/26/2020   PPV/MP - Dr. Bernarda Caffey   GAS INSERTION Right 05/26/2020   Procedure:  INSERTION OF GAS;  Surgeon: Bernarda Caffey, MD;  Location: [facility];  Service: Ophthalmology;  Laterality: Right;   INGUINAL HERNIA REPAIR Right 05/29/2021   Procedure: RIGHT INGUINAL HERNIA REPAIR WITH MESH;  Surgeon: Jovita Kussmaul, MD;  Location: [facility];  Service: General;  Laterality: Right;  TAP BLOCK   LEFT HEART CATH AND CORONARY ANGIOGRAPHY N/A 04/05/2021   Procedure: LEFT HEART CATH AND CORONARY ANGIOGRAPHY;  Surgeon: Burnell Blanks, MD;  Location: [facility];  Service: Cardiovascular;  Laterality: N/A;   MEMBRANE PEEL Right 05/26/2020   Procedure: MEMBRANE PEEL;  Surgeon: Bernarda Caffey, MD;  Location: [facility];  Service: Ophthalmology;  Laterality: Right;   PARS PLANA VITRECTOMY Right 05/26/2020   Procedure: PARS PLANA VITRECTOMY WITH 25 GAUGE;  Surgeon: Bernarda Caffey, MD;  Location: [facility];  Service: Ophthalmology;  Laterality: Right;   PERIPHERAL VASCULAR INTERVENTION Right 12/16/2020   Procedure: PERIPHERAL VASCULAR INTERVENTION;  Surgeon: Elam Dutch, MD;  Location: [facility];  Service: Cardiovascular;  Laterality: Right;  SFA (distal)   PHOTOCOAGULATION Right 05/26/2020   Procedure: PHOTOCOAGULATION;  Surgeon: Bernarda Caffey, MD;  Location: [facility];  Service: Ophthalmology;  Laterality: Right;   PROSTATE BIOPSY  2015   RETINAL DETACHMENT SURGERY Right 05/06/2020   Pneumatic retinopexy for rheg RD repair - Dr. Bernarda Caffey   RETINAL DETACHMENT SURGERY Right 05/26/2020   PPV/MP - Dr. Bernarda Caffey   FAMILY HISTORY Family History  Problem Relation Age of Onset   Hypertension Mother    Deep vein thrombosis Father    Diabetes Brother    Tuberculosis Maternal Grandfather    Emphysema Maternal Grandfather    Lung disease Brother    Asthma Son    Rectal cancer Neg Hx    Colon cancer Neg Hx    SOCIAL HISTORY Social History   Tobacco Use   Smoking status: Former    Packs/day: 1.50    Years: 46.00    Total pack years: 69.00    Types:  Cigarettes    Quit date: 12/12/2010    Years since quitting: 11.1   Smokeless tobacco: Never  Vaping Use   Vaping Use: Never used  Substance Use Topics   Alcohol use: Yes    Alcohol/week: 4.0 standard drinks of alcohol    Types: 4 Cans of beer per week    Comment: a couple of beer on a weekend   Drug use: Not Currently    Types: Cocaine, Marijuana, "Crack" cocaine    Comment: over 25 years ago       OPHTHALMIC EXAM:  Base Eye Exam     Visual Acuity (Snellen - Linear)       Right Left   Dist cc 20/50 +2 20/25 -2   Dist ph cc 20/40 20/25 +2  Tonometry (Tonopen, 2:11 PM)       Right Left   Pressure 14 12         Pupils       Dark Light Shape React APD   Right 4 4 Round Minimal None   Left 3 2 Round Brisk None         Visual Fields (Counting fingers)       Left Right    Full Full         Extraocular Movement       Right Left    Full, Ortho Full, Ortho         Neuro/Psych     Oriented x3: Yes   Mood/Affect: Normal         Dilation     Both eyes: 1.0% Mydriacyl, 2.5% Phenylephrine @ 2:12 PM           Slit Lamp and Fundus Exam     External Exam       Right Left   External Normal Normal         Slit Lamp Exam       Right Left   Lids/Lashes Dermatochalasis - upper lid Normal   Conjunctiva/Sclera White and quiet White and quiet   Cornea Trace Punctate epithelial erosions, well healed cataract wound trace Punctate epithelial erosions   Anterior Chamber Deep and clear, No cells or flare Deep and quiet   Iris Round and dilated, device at 0230 angle Round and dilated   Lens PC IOL in good position, 1+ Posterior capsular opacification 2+ Nuclear sclerosis with mild brunescence, 2+ Cortical cataract   Anterior Vitreous post vitrectomy, clear Vitreous syneresis, Posterior vitreous detachment         Fundus Exam       Right Left   Disc Pink and Sharp, +cupping Pink and Sharp   C/D Ratio 0.6 0.6   Macula Flat; blunted  foveal reflex, ERM gone; mild RPE mottling, scattered MA, interval improvement in focal pockets of SRF -- resolved Flat, Blunted foveal reflex, mild ERM, No heme or edema   Vessels attenuated, mild Copper wiring, mild AV crossing changes attenuated, mild tortuousity   Periphery Attached, good cryo ST quad, good 360 laser changes; ORIGINALLY: Temporal mac off detachment w/ HST at 1030 Attached, HST at 0130 no SRF--good laser changes surrounding, mild inferior paving stone degeneration, No new RT/RD           Refraction     Wearing Rx       Sphere Cylinder Axis Add   Right +1.00 +0.75 009 +2.25   Left             Manifest Refraction       Sphere Cylinder Axis Dist VA   Right +0.75 +0.50 180 20/40   Left +1.00 +1.25 165 20/20-2            IMAGING AND PROCEDURES  Imaging and Procedures for 01/15/2022  OCT, Retina - OU - Both Eyes       Right Eye Quality was good. Central Foveal Thickness: 274. Progression has improved. Findings include no IRF, no SRF, abnormal foveal contour, outer retinal atrophy (Retina stably reattached; Interval improvement in trace focal pocket of SRF IN macula, SRF completely resolved).   Left Eye Quality was good. Central Foveal Thickness: 288. Progression has been stable. Findings include normal foveal contour, no IRF, no SRF, epiretinal membrane (Trace focal ERM nasal macula).   Notes *Images captured and stored on drive  Diagnosis / Impression:  OD: Retina stably reattached; Interval improvement in trace focal pocket of SRF IN macula, SRF completely resolved OS: Trace, focal ERM nasal macula  Clinical management:  See below  Abbreviations: NFP - Normal foveal profile. CME - cystoid macular edema. PED - pigment epithelial detachment. IRF - intraretinal fluid. SRF - subretinal fluid. EZ - ellipsoid zone. ERM - epiretinal membrane. ORA - outer retinal atrophy. ORT - outer retinal tubulation. SRHM - subretinal hyper-reflective material. IRHM -  intraretinal hyper-reflective material            ASSESSMENT/PLAN:    ICD-10-CM   1. Right retinal detachment  H33.21 OCT, Retina - OU - Both Eyes    2. Epiretinal membrane (ERM) of right eye  H35.371 OCT, Retina - OU - Both Eyes    3. Retinal tear of left eye  H33.312     4. Essential hypertension  I10     5. Hypertensive retinopathy of both eyes  H35.033     6. Combined forms of age-related cataract of left eye  H25.812     7. Pseudophakia  Z96.1     8. PCO (posterior capsular opacification), right  H26.491      1. Rhegmatogenous retinal detachment, OD - bullous superior mac off detachment, onset of foveal involvement Thurday, 09.23.21 by pt history - detached temporally from 0730 to 11 oclock, fovea off, horseshoe tear at 1030 - s/p pneumatic retinopexy w/ C3F8 OD 09.24.21 - supplemental intravitreal C3F8 gas injection OD (2.5cc on 10.06.21) **persistent shallow SRF remained due to ERM preventing full reattachment**  - s/p PPV/TissueBlue stain/MP/EL/FAX/14% C3F8 OD, 10.14.2021  - exam and OCT show Retina stably reattached; Interval improvement in trace focal pocket of SRF IN macula, SRF completely resolved  - BCVA 20/40 from 20/25 -- ?PCO progression             - IOP good at 14   2. Epiretinal membrane, right eye  - +ERM w/ pucker likely impacting RD and SRF OD  - s/p PPV/TissueBlue stain/MP/EL/FAX/14% C3F8 OD, 10.14.2021 as above - ERM gone - OCT shows ERM gone and retinal thickening stably improved - monitor  3. Retinal tear, OS - horse shoe tear located at 0130 - s/p laser retinopexy OS 09.24.21 - good laser changes in place - no new RT/RD  4,5. Hypertensive retinopathy OU - discussed importance of tight BP control - monitor  6. Mixed Cataract OS - The symptoms of cataract, surgical options, and treatments and risks were discussed with patient. - discussed diagnosis and progression  7. Pseudophakia OD  - s/p CE/IOL OD (Dr. Brigitte Pulse, 02.07.22)  - IOL in  good position, doing well - monitor  8. PCO OD  - vision went from 20/25 to 20/40 today  - discussed treatment options  - patient would like to proceed with YAG laser here in our office   - schedule YAG OD in 2 weeks  Ophthalmic Meds Ordered this visit:  No orders of the defined types were placed in this encounter.    Return in about 2 weeks (around 01/29/2022) for DFE, OCT, YAG OD.  There are no Patient Instructions on file for this visit.  This document serves as a record of services personally performed by Gardiner Sleeper, MD, PhD. It was created on their behalf by Roselee Nova, COMT. The creation of this record is the provider's dictation and/or activities during the visit.  Electronically signed by: Roselee Nova, COMT 01/18/22 2:29 PM  This document serves  as a record of services personally performed by Gardiner Sleeper, MD, PhD. It was created on their behalf by Leonie Douglas, an ophthalmic technician. The creation of this record is the provider's dictation and/or activities during the visit.    Electronically signed by: Leonie Douglas COA, 01/18/22  2:29 PM  Gardiner Sleeper, M.D., Ph.D. Diseases & Surgery of the Retina and Vitreous [facility]  I have reviewed the above documentation for accuracy and completeness, and I agree with the above. Gardiner Sleeper, M.D., Ph.D. 01/18/22 2:31 PM    Abbreviations: M myopia (nearsighted); A astigmatism; H hyperopia (farsighted); P presbyopia; Mrx spectacle prescription;  CTL contact lenses; OD right eye; OS left eye; OU both eyes  XT exotropia; ET esotropia; PEK punctate epithelial keratitis; PEE punctate epithelial erosions; DES dry eye syndrome; MGD meibomian gland dysfunction; ATs artificial tears; PFAT's preservative free artificial tears; [place] nuclear sclerotic cataract; PSC posterior subcapsular cataract; ERM epi-retinal membrane; PVD posterior vitreous detachment; RD retinal detachment; DM diabetes mellitus; DR  diabetic retinopathy; NPDR non-proliferative diabetic retinopathy; PDR proliferative diabetic retinopathy; CSME clinically significant macular edema; DME diabetic macular edema; dbh dot blot hemorrhages; CWS cotton wool spot; POAG primary open angle glaucoma; C/D cup-to-disc ratio; HVF humphrey visual field; GVF goldmann visual field; OCT optical coherence tomography; IOP intraocular pressure; BRVO Branch retinal vein occlusion; CRVO central retinal vein occlusion; CRAO central retinal artery occlusion; BRAO branch retinal artery occlusion; RT retinal tear; SB scleral buckle; PPV pars plana vitrectomy; VH Vitreous hemorrhage; PRP panretinal laser photocoagulation; IVK intravitreal kenalog; VMT vitreomacular traction; MH Macular hole;  NVD neovascularization of the disc; NVE neovascularization elsewhere; AREDS age related eye disease study; ARMD age related macular degeneration; POAG primary open angle glaucoma; EBMD epithelial/anterior basement membrane dystrophy; ACIOL anterior chamber intraocular lens; IOL intraocular lens; PCIOL posterior chamber intraocular lens; Phaco/IOL phacoemulsification with intraocular lens placement; [place] photorefractive keratectomy; LASIK laser assisted in situ keratomileusis; HTN hypertension; DM diabetes mellitus; COPD chronic obstructive pulmonary disease

## 2022-01-18 ENCOUNTER — Encounter (INDEPENDENT_AMBULATORY_CARE_PROVIDER_SITE_OTHER): Payer: Self-pay | Admitting: Ophthalmology

## 2022-01-22 ENCOUNTER — Other Ambulatory Visit: Payer: Self-pay | Admitting: Family Medicine

## 2022-01-22 DIAGNOSIS — F418 Other specified anxiety disorders: Secondary | ICD-10-CM

## 2022-02-08 NOTE — Progress Notes (Signed)
[facility] Note  02/12/2022     CHIEF COMPLAINT Patient presents for Retina Follow Up    HISTORY OF PRESENT ILLNESS: Todd Weeks is a 66 y.o. male who presents to the clinic today for:  HPI     Retina Follow Up   Patient presents with  Retinal Break/Detachment.  In right eye.  This started years ago.  Severity is moderate.  Duration of 2 months.  Since onset it is gradually worsening.  I, the attending physician,  performed the HPI with the patient and updated documentation appropriately.        Comments   Patient feels that the vision is getting worse. He is here today for a YAG OD.       Last edited by Bernarda Caffey, MD on 02/12/2022  8:34 AM.      Referring physician: Vivi Barrack, [address],  [place] [id number]  HISTORICAL INFORMATION:   Selected notes from the MEDICAL RECORD NUMBER    CURRENT MEDICATIONS: No current outpatient medications on file. (Ophthalmic Drugs)   No current facility-administered medications for this visit. (Ophthalmic Drugs)   Current Outpatient Medications (Other)  Medication Sig   ALPRAZolam (XANAX) 0.25 MG tablet Take 1 tablet (0.25 mg total) by mouth 2 (two) times daily as needed for anxiety.   aspirin EC 81 MG tablet Take 1 tablet (81 mg total) by mouth daily.   atorvastatin (LIPITOR) 40 MG tablet TAKE 1 TABLET BY MOUTH ONCE A DAY.   clopidogrel (PLAVIX) 75 MG tablet TAKE (1) TABLET BY MOUTH ONCE DAILY.   diclofenac Sodium (VOLTAREN) 1 % GEL Apply 1 application topically 3 (three) times daily as needed (leg cramps/spasms).   famotidine (PEPCID) 20 MG tablet TAKE 1 TABLET BY MOUTH ONCE DAILY.   FLUoxetine (PROZAC) 40 MG capsule TAKE (1) CAPSULE BY MOUTH DAILY   FLUZONE HIGH-DOSE QUADRIVALENT 0.7 ML SUSY    irbesartan (AVAPRO) 300 MG tablet TAKE 1 TABLET BY MOUTH ONCE A DAY.   nitroGLYCERIN (NITROSTAT) 0.4 MG SL tablet Place 0.4 mg under the tongue every 5 (five) minutes as needed for chest  pain.   PFIZER COVID-19 VAC BIVALENT injection    tamsulosin (FLOMAX) 0.4 MG CAPS capsule Take 1 capsule (0.4 mg total) by mouth daily.   Current Facility-Administered Medications (Other)  Medication Route   sodium chloride flush (NS) 0.9 % injection 3 mL Intravenous   REVIEW OF SYSTEMS: ROS   Positive for: Gastrointestinal, Neurological, Cardiovascular, Eyes Negative for: Constitutional, Skin, Genitourinary, Musculoskeletal, HENT, Endocrine, Respiratory, Psychiatric, Allergic/Imm, Heme/Lymph Last edited by Annie Paras, COT on 02/12/2022  7:56 AM.     ALLERGIES No Known Allergies  PAST MEDICAL HISTORY Past Medical History:  Diagnosis Date   Bell's palsy    Cataract    Mixed form OU   Closed nondisplaced fracture of neck of third metacarpal bone of right hand 02/04/2017   Clotting disorder ([place])    Depression    Diverticulitis    GERD (gastroesophageal reflux disease)    Headache(784.0)    HTN (hypertension)    Hyperlipidemia    Hypertensive retinopathy    OU   Mild CAD 2012   PVD (peripheral vascular disease) ([place])    Retinal detachment    Sexual dysfunction    Sleep apnea    Past Surgical History:  Procedure Laterality Date   ABDOMINAL AORTOGRAM W/LOWER EXTREMITY Bilateral 02/05/2020   Procedure: ABDOMINAL AORTOGRAM W/LOWER EXTREMITY;  Surgeon: Oneida Alar,  Jessy Oto, MD;  Location: [facility];  Service: Cardiovascular;  Laterality: Bilateral;   ABDOMINAL AORTOGRAM W/LOWER EXTREMITY N/A 12/16/2020   Procedure: ABDOMINAL AORTOGRAM W/LOWER EXTREMITY;  Surgeon: Elam Dutch, MD;  Location: [facility];  Service: Cardiovascular;  Laterality: N/A;   CATARACT EXTRACTION     EYE SURGERY Right 05/06/2020   Pneumatic retinopexy for rheg. RD repair - Dr. Bernarda Caffey   EYE SURGERY Right 05/26/2020   PPV/MP - Dr. Bernarda Caffey   GAS INSERTION Right 05/26/2020   Procedure: INSERTION OF GAS;  Surgeon: Bernarda Caffey, MD;  Location: [facility];  Service:  Ophthalmology;  Laterality: Right;   INGUINAL HERNIA REPAIR Right 05/29/2021   Procedure: RIGHT INGUINAL HERNIA REPAIR WITH MESH;  Surgeon: Jovita Kussmaul, MD;  Location: [facility];  Service: General;  Laterality: Right;  TAP BLOCK   LEFT HEART CATH AND CORONARY ANGIOGRAPHY N/A 04/05/2021   Procedure: LEFT HEART CATH AND CORONARY ANGIOGRAPHY;  Surgeon: Burnell Blanks, MD;  Location: [facility];  Service: Cardiovascular;  Laterality: N/A;   MEMBRANE PEEL Right 05/26/2020   Procedure: MEMBRANE PEEL;  Surgeon: Bernarda Caffey, MD;  Location: [facility];  Service: Ophthalmology;  Laterality: Right;   PARS PLANA VITRECTOMY Right 05/26/2020   Procedure: PARS PLANA VITRECTOMY WITH 25 GAUGE;  Surgeon: Bernarda Caffey, MD;  Location: [facility];  Service: Ophthalmology;  Laterality: Right;   PERIPHERAL VASCULAR INTERVENTION Right 12/16/2020   Procedure: PERIPHERAL VASCULAR INTERVENTION;  Surgeon: Elam Dutch, MD;  Location: [facility];  Service: Cardiovascular;  Laterality: Right;  SFA (distal)   PHOTOCOAGULATION Right 05/26/2020   Procedure: PHOTOCOAGULATION;  Surgeon: Bernarda Caffey, MD;  Location: [facility];  Service: Ophthalmology;  Laterality: Right;   PROSTATE BIOPSY  2015   RETINAL DETACHMENT SURGERY Right 05/06/2020   Pneumatic retinopexy for rheg RD repair - Dr. Bernarda Caffey   RETINAL DETACHMENT SURGERY Right 05/26/2020   PPV/MP - Dr. Bernarda Caffey   FAMILY HISTORY Family History  Problem Relation Age of Onset   Hypertension Mother    Deep vein thrombosis Father    Diabetes Brother    Tuberculosis Maternal Grandfather    Emphysema Maternal Grandfather    Lung disease Brother    Asthma Son    Rectal cancer Neg Hx    Colon cancer Neg Hx    SOCIAL HISTORY Social History   Tobacco Use   Smoking status: Former    Packs/day: 1.50    Years: 46.00    Total pack years: 69.00    Types: Cigarettes    Quit date: 12/12/2010    Years since quitting: 11.1    Smokeless tobacco: Never  Vaping Use   Vaping Use: Never used  Substance Use Topics   Alcohol use: Yes    Alcohol/week: 4.0 standard drinks of alcohol    Types: 4 Cans of beer per week    Comment: a couple of beer on a weekend   Drug use: Not Currently    Types: Cocaine, Marijuana, "Crack" cocaine    Comment: over 25 years ago       OPHTHALMIC EXAM:  Base Eye Exam     Visual Acuity (Snellen - Linear)       Right Left   Dist cc 20/25 +1 20/25    Correction: Glasses         Tonometry (Tonopen, 8:00 AM)       Right Left   Pressure 8 12  Pupils       Dark Light Shape React APD   Right 4 4 Round Minimal None   Left 4 3 Round Brisk None         Visual Fields       Left Right    Full Full         Extraocular Movement       Right Left    Full, Ortho Full, Ortho         Neuro/Psych     Oriented x3: Yes   Mood/Affect: Normal         Dilation     Right eye: 1.0% Mydriacyl, 2.5% Phenylephrine @ 7:58 AM           Slit Lamp and Fundus Exam     External Exam       Right Left   External Normal Normal         Slit Lamp Exam       Right Left   Lids/Lashes Dermatochalasis - upper lid Normal   Conjunctiva/Sclera White and quiet White and quiet   Cornea Trace Punctate epithelial erosions, well healed cataract wound trace Punctate epithelial erosions   Anterior Chamber Deep and clear, No cells or flare Deep and quiet   Iris Round and dilated, device at 0230 angle Round and dilated   Lens PC IOL in good position, 1+ Posterior capsular opacification 2+ Nuclear sclerosis with mild brunescence, 2+ Cortical cataract   Anterior Vitreous post vitrectomy, clear Vitreous syneresis, Posterior vitreous detachment         Fundus Exam       Right Left   Disc Pink and Sharp, +cupping Pink and Sharp   C/D Ratio 0.6 0.6   Macula Flat; blunted foveal reflex, ERM gone; mild RPE mottling, scattered MA, interval improvement in focal pockets of  SRF -- resolved Flat, Blunted foveal reflex, mild ERM, No heme or edema   Vessels attenuated, mild Copper wiring, mild AV crossing changes attenuated, mild tortuousity   Periphery Attached, good cryo ST quad, good 360 laser changes; ORIGINALLY: Temporal mac off detachment w/ HST at 1030 Attached, HST at 0130 no SRF--good laser changes surrounding, mild inferior paving stone degeneration, No new RT/RD           Refraction     Wearing Rx       Sphere Cylinder Axis Add   Right +1.00 +0.75 009 +2.25   Left               IMAGING AND PROCEDURES  Imaging and Procedures for 02/12/2022  OCT, Retina - OU - Both Eyes       Right Eye Quality was good. Central Foveal Thickness: 273. Progression has been stable. Findings include normal foveal contour, no IRF, no SRF, outer retinal atrophy (Retina stably reattached -- SRF stably resolved).   Left Eye Quality was good. Central Foveal Thickness: 287. Progression has been stable. Findings include normal foveal contour, no IRF, no SRF, epiretinal membrane (Trace focal ERM nasal macula).   Notes *Images captured and stored on drive  Diagnosis / Impression:  OD: Retina stably reattached -- SRF stably resolved OS: Trace, focal ERM nasal macula  Clinical management:  See below  Abbreviations: NFP - Normal foveal profile. CME - cystoid macular edema. PED - pigment epithelial detachment. IRF - intraretinal fluid. SRF - subretinal fluid. EZ - ellipsoid zone. ERM - epiretinal membrane. ORA - outer retinal atrophy. ORT - outer retinal tubulation. SRHM - subretinal  hyper-reflective material. IRHM - intraretinal hyper-reflective material      Yag Capsulotomy - OD - Right Eye       Time Out Confirmed correct patient, procedure, site, and patient consented.   Anesthesia Topical anesthesia was used.   Notes Procedure note: YAG Capsulotomy, RIGHT Eye  Informed consent obtained. Pre-op dilating drops (1% Topicamide and 2.5% Phenylephrine),  and topical anesthesia given. Power: 6.5 mJ Shots:  5 Posterior capsulotomy in cruciate formation performed without difficulty. Patient tolerated procedure well. No complications. Lotmemax SM 4 times a day for 5-7 days, then stop. Pt received written and verbal post laser education. Recheck in 2-3 weeks w/ dilated exam.           ASSESSMENT/PLAN:    ICD-10-CM   1. Right retinal detachment  H33.21 OCT, Retina - OU - Both Eyes    2. Epiretinal membrane (ERM) of right eye  H35.371 OCT, Retina - OU - Both Eyes    3. Retinal tear of left eye  H33.312     4. Essential hypertension  I10     5. Hypertensive retinopathy of both eyes  H35.033     6. Combined forms of age-related cataract of left eye  H25.812     7. PCO (posterior capsular opacification), right  H26.491 Yag Capsulotomy - OD - Right Eye     1. Rhegmatogenous retinal detachment, OD - bullous superior mac off detachment, onset of foveal involvement Thurday, 09.23.21 by pt history - detached temporally from 0730 to 11 oclock, fovea off, horseshoe tear at 1030 - s/p pneumatic retinopexy w/ C3F8 OD 09.24.21 - supplemental intravitreal C3F8 gas injection OD (2.5cc on 10.06.21) **persistent shallow SRF remained due to ERM preventing full reattachment**  - s/p PPV/TissueBlue stain/MP/EL/FAX/14% C3F8 OD, 10.14.2021  - exam and OCT show Retina stably reattached  - BCVA 20/25             - IOP good at 8   2. Epiretinal membrane, right eye  - +ERM w/ pucker likely impacting RD and SRF OD  - s/p PPV/TissueBlue stain/MP/EL/FAX/14% C3F8 OD, 10.14.2021 as above - ERM gone - OCT shows ERM gone and retinal thickening stably improved - monitor  3. Retinal tear, OS - horse shoe tear located at 0130 - s/p laser retinopexy OS 09.24.21 - good laser changes in place - no new RT/RD  4,5. Hypertensive retinopathy OU - discussed importance of tight BP control - monitor  6. Mixed Cataract OS - The symptoms of cataract, surgical  options, and treatments and risks were discussed with patient. - discussed diagnosis and progression   7. Pseudophakia OD  - s/p CE/IOL OD (Dr. Brigitte Pulse, 02.07.22)  - IOL in good position, doing well - monitor  8. PCO OD - discussed treatment options - recommend Yag Cap OD today, 07.03.23  - patient wishes to proceed with YAG laser here in our office   - RBA of procedure discussed, questions answered - informed consent obtained and signed - see procedure note - start Lotemax QID OD x5-7 days - f/u 2-3 weeks, DFE, OCT   Ophthalmic Meds Ordered this visit:  No orders of the defined types were placed in this encounter.    Return for 2-3 wks - post YAG OD - Dilated Exam, OCT.  There are no Patient Instructions on file for this visit.  This document serves as a record of services personally performed by Gardiner Sleeper, MD, PhD. It was created on their behalf by Roselee Nova, COMT. The  creation of this record is the provider's dictation and/or activities during the visit.  Electronically signed by: Roselee Nova, COMT 02/12/22 8:47 AM  This document serves as a record of services personally performed by Gardiner Sleeper, MD, PhD. It was created on their behalf by Leonie Douglas, an ophthalmic technician. The creation of this record is the provider's dictation and/or activities during the visit.    Electronically signed by: Leonie Douglas COA, 02/12/22  8:47 AM   Gardiner Sleeper, M.D., Ph.D. Diseases & Surgery of the Retina and Vitreous [facility]  I have reviewed the above documentation for accuracy and completeness, and I agree with the above. Gardiner Sleeper, M.D., Ph.D. 02/12/22 8:49 AM   Abbreviations: M myopia (nearsighted); A astigmatism; H hyperopia (farsighted); P presbyopia; Mrx spectacle prescription;  CTL contact lenses; OD right eye; OS left eye; OU both eyes  XT exotropia; ET esotropia; PEK punctate epithelial keratitis; PEE punctate epithelial  erosions; DES dry eye syndrome; MGD meibomian gland dysfunction; ATs artificial tears; PFAT's preservative free artificial tears; [place] nuclear sclerotic cataract; PSC posterior subcapsular cataract; ERM epi-retinal membrane; PVD posterior vitreous detachment; RD retinal detachment; DM diabetes mellitus; DR diabetic retinopathy; NPDR non-proliferative diabetic retinopathy; PDR proliferative diabetic retinopathy; CSME clinically significant macular edema; DME diabetic macular edema; dbh dot blot hemorrhages; CWS cotton wool spot; POAG primary open angle glaucoma; C/D cup-to-disc ratio; HVF humphrey visual field; GVF goldmann visual field; OCT optical coherence tomography; IOP intraocular pressure; BRVO Branch retinal vein occlusion; CRVO central retinal vein occlusion; CRAO central retinal artery occlusion; BRAO branch retinal artery occlusion; RT retinal tear; SB scleral buckle; PPV pars plana vitrectomy; VH Vitreous hemorrhage; PRP panretinal laser photocoagulation; IVK intravitreal kenalog; VMT vitreomacular traction; MH Macular hole;  NVD neovascularization of the disc; NVE neovascularization elsewhere; AREDS age related eye disease study; ARMD age related macular degeneration; POAG primary open angle glaucoma; EBMD epithelial/anterior basement membrane dystrophy; ACIOL anterior chamber intraocular lens; IOL intraocular lens; PCIOL posterior chamber intraocular lens; Phaco/IOL phacoemulsification with intraocular lens placement; [place] photorefractive keratectomy; LASIK laser assisted in situ keratomileusis; HTN hypertension; DM diabetes mellitus; COPD chronic obstructive pulmonary disease

## 2022-02-12 ENCOUNTER — Encounter (INDEPENDENT_AMBULATORY_CARE_PROVIDER_SITE_OTHER): Payer: Self-pay | Admitting: Ophthalmology

## 2022-02-12 ENCOUNTER — Ambulatory Visit (INDEPENDENT_AMBULATORY_CARE_PROVIDER_SITE_OTHER): Payer: PPO | Admitting: Ophthalmology

## 2022-02-12 DIAGNOSIS — H35371 Puckering of macula, right eye: Secondary | ICD-10-CM

## 2022-02-12 DIAGNOSIS — I10 Essential (primary) hypertension: Secondary | ICD-10-CM

## 2022-02-12 DIAGNOSIS — H26491 Other secondary cataract, right eye: Secondary | ICD-10-CM | POA: Diagnosis not present

## 2022-02-12 DIAGNOSIS — H3321 Serous retinal detachment, right eye: Secondary | ICD-10-CM | POA: Diagnosis not present

## 2022-02-12 DIAGNOSIS — H35033 Hypertensive retinopathy, bilateral: Secondary | ICD-10-CM | POA: Diagnosis not present

## 2022-02-12 DIAGNOSIS — H25812 Combined forms of age-related cataract, left eye: Secondary | ICD-10-CM

## 2022-02-12 DIAGNOSIS — H33312 Horseshoe tear of retina without detachment, left eye: Secondary | ICD-10-CM

## 2022-03-05 ENCOUNTER — Encounter (INDEPENDENT_AMBULATORY_CARE_PROVIDER_SITE_OTHER): Payer: PPO | Admitting: Ophthalmology

## 2022-03-12 ENCOUNTER — Other Ambulatory Visit: Payer: Self-pay | Admitting: Family Medicine

## 2022-03-12 DIAGNOSIS — K219 Gastro-esophageal reflux disease without esophagitis: Secondary | ICD-10-CM

## 2022-03-12 DIAGNOSIS — I2510 Atherosclerotic heart disease of native coronary artery without angina pectoris: Secondary | ICD-10-CM

## 2022-03-19 ENCOUNTER — Encounter (INDEPENDENT_AMBULATORY_CARE_PROVIDER_SITE_OTHER): Payer: PPO | Admitting: Ophthalmology

## 2022-03-19 DIAGNOSIS — H35033 Hypertensive retinopathy, bilateral: Secondary | ICD-10-CM

## 2022-03-19 DIAGNOSIS — I10 Essential (primary) hypertension: Secondary | ICD-10-CM

## 2022-03-19 DIAGNOSIS — H26491 Other secondary cataract, right eye: Secondary | ICD-10-CM

## 2022-03-19 DIAGNOSIS — H25812 Combined forms of age-related cataract, left eye: Secondary | ICD-10-CM

## 2022-03-19 DIAGNOSIS — Z961 Presence of intraocular lens: Secondary | ICD-10-CM

## 2022-03-19 DIAGNOSIS — H33312 Horseshoe tear of retina without detachment, left eye: Secondary | ICD-10-CM

## 2022-03-19 DIAGNOSIS — H35371 Puckering of macula, right eye: Secondary | ICD-10-CM

## 2022-03-19 DIAGNOSIS — H3321 Serous retinal detachment, right eye: Secondary | ICD-10-CM

## 2022-04-03 ENCOUNTER — Telehealth: Payer: Self-pay | Admitting: Family Medicine

## 2022-04-03 NOTE — Telephone Encounter (Signed)
   LAST APPOINTMENT DATE:   02/20/21 OV with PCP   NEXT APPOINTMENT DATE: 04/10/22 OV with PCP   MEDICATION: irbesartan (AVAPRO) 300 MG tablet [[id number]]    Is the patient out of medication?  Yes  PHARMACY: [facility] [place] - [place], [place] [address]  Phone:  [phone]  Fax:  [phone]

## 2022-04-04 ENCOUNTER — Other Ambulatory Visit: Payer: Self-pay | Admitting: *Deleted

## 2022-04-04 MED ORDER — IRBESARTAN 300 MG PO TABS: 300.0000 mg | ORAL_TABLET | Freq: Every day | ORAL | 0 refills | Status: DC

## 2022-04-04 NOTE — Telephone Encounter (Signed)
Rx send to pharmacy  

## 2022-04-09 ENCOUNTER — Ambulatory Visit: Payer: PPO | Admitting: Family Medicine

## 2022-04-10 ENCOUNTER — Ambulatory Visit: Payer: PPO | Admitting: Family Medicine

## 2022-04-23 ENCOUNTER — Encounter: Payer: Self-pay | Admitting: Family Medicine

## 2022-04-23 ENCOUNTER — Ambulatory Visit (INDEPENDENT_AMBULATORY_CARE_PROVIDER_SITE_OTHER): Payer: PPO | Admitting: Family Medicine

## 2022-04-23 VITALS — BP 133/81 | HR 65 | Temp 97.6°F | Ht 72.0 in | Wt 230.8 lb

## 2022-04-23 DIAGNOSIS — I2510 Atherosclerotic heart disease of native coronary artery without angina pectoris: Secondary | ICD-10-CM

## 2022-04-23 DIAGNOSIS — I10 Essential (primary) hypertension: Secondary | ICD-10-CM | POA: Diagnosis not present

## 2022-04-23 DIAGNOSIS — K219 Gastro-esophageal reflux disease without esophagitis: Secondary | ICD-10-CM

## 2022-04-23 DIAGNOSIS — E782 Mixed hyperlipidemia: Secondary | ICD-10-CM

## 2022-04-23 DIAGNOSIS — N400 Enlarged prostate without lower urinary tract symptoms: Secondary | ICD-10-CM

## 2022-04-23 DIAGNOSIS — F418 Other specified anxiety disorders: Secondary | ICD-10-CM

## 2022-04-23 DIAGNOSIS — R739 Hyperglycemia, unspecified: Secondary | ICD-10-CM

## 2022-04-23 DIAGNOSIS — I70219 Atherosclerosis of native arteries of extremities with intermittent claudication, unspecified extremity: Secondary | ICD-10-CM | POA: Diagnosis not present

## 2022-04-23 DIAGNOSIS — M2550 Pain in unspecified joint: Secondary | ICD-10-CM | POA: Insufficient documentation

## 2022-04-23 DIAGNOSIS — F419 Anxiety disorder, unspecified: Secondary | ICD-10-CM

## 2022-04-23 DIAGNOSIS — F324 Major depressive disorder, single episode, in partial remission: Secondary | ICD-10-CM | POA: Diagnosis not present

## 2022-04-23 LAB — LIPID PANEL
Cholesterol: 122 mg/dL (ref 0–200)
HDL: 50.3 mg/dL (ref >39.00)
LDL Cholesterol: 60 mg/dL (ref 0–99)
NonHDL: 72.08
Total CHOL/HDL Ratio: 2
Triglycerides: 59 mg/dL (ref 0.0–149.0)
VLDL: 11.8 mg/dL (ref 0.0–40.0)

## 2022-04-23 LAB — CBC
HCT: 42.4 % (ref 39.0–52.0)
Hemoglobin: 14.3 g/dL (ref 13.0–17.0)
MCHC: 33.6 g/dL (ref 30.0–36.0)
MCV: 90.7 fl (ref 78.0–100.0)
Platelets: 241 10*3/uL (ref 150.0–400.0)
RBC: 4.68 Mil/uL (ref 4.22–5.81)
RDW: 13 % (ref 11.5–15.5)
WBC: 7.8 10*3/uL (ref 4.0–10.5)

## 2022-04-23 LAB — COMPREHENSIVE METABOLIC PANEL
ALT: 26 U/L (ref 0–53)
AST: 19 U/L (ref 0–37)
Albumin: 4.3 g/dL (ref 3.5–5.2)
Alkaline Phosphatase: 88 U/L (ref 39–117)
BUN: 20 mg/dL (ref 6–23)
CO2: 28 mEq/L (ref 19–32)
Calcium: 9.5 mg/dL (ref 8.4–10.5)
Chloride: 103 mEq/L (ref 96–112)
Creatinine, Ser: 0.74 mg/dL (ref 0.40–1.50)
GFR: 93.7 mL/min (ref >60.00)
Glucose, Bld: 93 mg/dL (ref 70–99)
Potassium: 4.7 mEq/L (ref 3.5–5.1)
Sodium: 139 mEq/L (ref 135–145)
Total Bilirubin: 0.6 mg/dL (ref 0.2–1.2)
Total Protein: 6.8 g/dL (ref 6.0–8.3)

## 2022-04-23 LAB — PSA: PSA: 69.9 ng/mL — ABNORMAL HIGH (ref 0.10–4.00)

## 2022-04-23 LAB — HEMOGLOBIN A1C: Hgb A1c MFr Bld: 5.6 % (ref 4.6–6.5)

## 2022-04-23 LAB — TSH: TSH: 2.65 u[IU]/mL (ref 0.35–5.50)

## 2022-04-23 MED ORDER — TAMSULOSIN HCL 0.4 MG PO CAPS
0.4000 mg | ORAL_CAPSULE | Freq: Every day | ORAL | 3 refills | Status: DC
Start: 2022-04-23 — End: 2023-06-04

## 2022-04-23 MED ORDER — CLOPIDOGREL BISULFATE 75 MG PO TABS: 75.0000 mg | ORAL_TABLET | Freq: Every day | ORAL | 3 refills | Status: DC

## 2022-04-23 MED ORDER — FLUOXETINE HCL 40 MG PO CAPS: ORAL_CAPSULE | ORAL | 3 refills | Status: DC

## 2022-04-23 MED ORDER — DICLOFENAC SODIUM 75 MG PO TBEC: 75.0000 mg | DELAYED_RELEASE_TABLET | Freq: Two times a day (BID) | ORAL | 0 refills | Status: DC

## 2022-04-23 MED ORDER — FAMOTIDINE 20 MG PO TABS: 20.0000 mg | ORAL_TABLET | Freq: Every day | ORAL | 3 refills | Status: DC

## 2022-04-23 MED ORDER — ATORVASTATIN CALCIUM 40 MG PO TABS: 40.0000 mg | ORAL_TABLET | Freq: Every day | ORAL | 3 refills | Status: DC

## 2022-04-23 MED ORDER — IRBESARTAN 300 MG PO TABS: 300.0000 mg | ORAL_TABLET | Freq: Every day | ORAL | 3 refills | Status: DC

## 2022-04-23 NOTE — Assessment & Plan Note (Signed)
Check A1c. 

## 2022-04-23 NOTE — Assessment & Plan Note (Signed)
Check labs.  He is on Lipitor 40 mg daily.

## 2022-04-23 NOTE — Progress Notes (Signed)
   Todd Weeks is a 66 y.o. male who presents today for an office visit.  Assessment/Plan:  New/Acute Problems: Right Upper Back PAin No red flags.  Likely secondary to rhomboid strain.  We will start diclofenac and place referral to physical therapy.  Chronic Problems Addressed Today: Polyarthralgia Hip pain likely secondary to osteoarthritis.  We will start diclofenac as above and refer to physical therapy.  We discussed referral to sports med orthopedics however he deferred.  He will follow-up in a 1-2 weeks if not improving.  Will consider imaging and/or referral at that time.  Hyperlipidemia Check labs.  He is on Lipitor 40 mg daily.  Atherosclerosis of native arteries of extremity with intermittent claudication Follows with cardiology.  He is on statin and Plavix.  Hyperglycemia Check A1c.  BPH (benign prostatic hyperplasia) Check PSA.  He follows with urology.  Will refill Flomax.  CAD (coronary artery disease) Follows with cardiology.  On lipitor and aspirin.  Depression, major, single episode, in partial remission (HCC) On Prozac 40 mg daily.  Has work-related stress but this is currently manageable.  We will continue current regimen for now.  Anxiety Stable on Prozac 40 mg daily.  Also continue Xanax 0.25 mg as needed.  He uses this very sparingly as needed and has not had to use much over the last year.  Does not need refill today.  GERD (gastroesophageal reflux disease) Stable.  Will refill Pepcid 20 mg daily.  HTN (hypertension) At goal on irbesartan 300 mg daily.     Subjective:  HPI:  Patient here with left leg pain, bilateral hip pain, and right upper back pain.    This all has been going on for about a year. No treatments tried. About the same over the last several weeks.  It is worse depending on sleep physician.  Worse throughout the day.  He has not taken any specific treatments for this.  No injuries.  No precipitating events.  See A/P for status  of chronic conditions.       Objective:  Physical Exam: BP 133/81   Pulse 65   Temp 97.6 F (36.4 C) (Temporal)   Ht 6' (1.829 m)   Wt 230 lb 12.8 oz (104.7 kg)   SpO2 96%   BMI 31.30 kg/m   Wt Readings from Last 3 Encounters:  04/23/22 230 lb 12.8 oz (104.7 kg)  10/23/21 253 lb (114.8 kg)  09/04/21 261 lb 9.6 oz (118.7 kg)  Gen: No acute distress, resting comfortably  CV: Regular rate and rhythm with no murmurs appreciated Pulm: Normal work of breathing, clear to auscultation bilaterally with no crackles, wheezes, or rhonchi MSK: - Back: Tenderness palpation along right paraspinal muscle groups near rhomboid. - Legs: No deformities.  Limited range of motion with internal and external rotation at hips left greater than right. Neuro: Grossly normal, moves all extremities Psych: Normal affect and thought content      Jhada Risk M. Jerline Pain, MD 04/23/2022 7:56 AM

## 2022-04-23 NOTE — Assessment & Plan Note (Signed)
Check PSA.  He follows with urology.  Will refill Flomax.

## 2022-04-23 NOTE — Assessment & Plan Note (Signed)
Follows with cardiology.  He is on statin and Plavix.

## 2022-04-23 NOTE — Assessment & Plan Note (Signed)
Stable.  Will refill Pepcid 20 mg daily.

## 2022-04-23 NOTE — Assessment & Plan Note (Signed)
Hip pain likely secondary to osteoarthritis.  We will start diclofenac as above and refer to physical therapy.  We discussed referral to sports med orthopedics however he deferred.  He will follow-up in a 1-2 weeks if not improving.  Will consider imaging and/or referral at that time.

## 2022-04-23 NOTE — Assessment & Plan Note (Signed)
At goal on irbesartan 300 mg daily.

## 2022-04-23 NOTE — Assessment & Plan Note (Signed)
Follows with cardiology.  On lipitor and aspirin.

## 2022-04-23 NOTE — Assessment & Plan Note (Signed)
Stable on Prozac 40 mg daily.  Also continue Xanax 0.25 mg as needed.  He uses this very sparingly as needed and has not had to use much over the last year.  Does not need refill today.

## 2022-04-23 NOTE — Assessment & Plan Note (Signed)
On Prozac 40 mg daily.  Has work-related stress but this is currently manageable.  We will continue current regimen for now.

## 2022-04-23 NOTE — Patient Instructions (Signed)
It was very nice to see you today!  You probably have a rhomboid strain in your back.  You probably have arthritis in your hips.  Please start the diclofenac.  I will refer you to see physical therapy.  I will refill your other medications today and we will check blood work.  Please let me know if your symptoms are not improving in the next 1 to 2 weeks.  Take care, Dr Jerline Pain  PLEASE NOTE:  If you had any lab tests please let us know if you have not heard back within a few days. You may see your results on mychart before we have a chance to review them but we will give you a call once they are reviewed by [place]. If we ordered any referrals today, please let us know if you have not heard from their office within the next week.   Please try these tips to maintain a healthy lifestyle:  Eat at least 3 REAL meals and 1-2 snacks per day.  Aim for no more than 5 hours between eating.  If you eat breakfast, please do so within one hour of getting up.   Each meal should contain half fruits/vegetables, one quarter protein, and one quarter carbs (no bigger than a computer mouse)  Cut down on sweet beverages. This includes juice, soda, and sweet tea.   Drink at least 1 glass of water with each meal and aim for at least 8 glasses per day  Exercise at least 150 minutes every week.

## 2022-04-26 NOTE — Progress Notes (Signed)
Please inform patient of the following:  His PSA is very high and higher than the last time we checked a few years ago.  He needs to go back to see his urologist ASAP.  Please make sure he follows up with them very soon.  The rest of his labs are all stable and we can recheck in a year.

## 2022-04-27 ENCOUNTER — Other Ambulatory Visit: Payer: Self-pay | Admitting: *Deleted

## 2022-04-27 DIAGNOSIS — R972 Elevated prostate specific antigen [PSA]: Secondary | ICD-10-CM

## 2022-04-30 ENCOUNTER — Encounter (INDEPENDENT_AMBULATORY_CARE_PROVIDER_SITE_OTHER): Payer: PPO | Admitting: Ophthalmology

## 2022-04-30 DIAGNOSIS — Z961 Presence of intraocular lens: Secondary | ICD-10-CM

## 2022-04-30 DIAGNOSIS — I10 Essential (primary) hypertension: Secondary | ICD-10-CM

## 2022-04-30 DIAGNOSIS — H35371 Puckering of macula, right eye: Secondary | ICD-10-CM

## 2022-04-30 DIAGNOSIS — H33312 Horseshoe tear of retina without detachment, left eye: Secondary | ICD-10-CM

## 2022-04-30 DIAGNOSIS — H3321 Serous retinal detachment, right eye: Secondary | ICD-10-CM

## 2022-04-30 DIAGNOSIS — H26491 Other secondary cataract, right eye: Secondary | ICD-10-CM

## 2022-04-30 DIAGNOSIS — H25812 Combined forms of age-related cataract, left eye: Secondary | ICD-10-CM

## 2022-04-30 DIAGNOSIS — H35033 Hypertensive retinopathy, bilateral: Secondary | ICD-10-CM

## 2022-05-14 ENCOUNTER — Ambulatory Visit: Payer: PPO | Admitting: Physical Therapy

## 2022-05-14 ENCOUNTER — Telehealth: Payer: Self-pay | Admitting: Family Medicine

## 2022-05-14 DIAGNOSIS — R351 Nocturia: Secondary | ICD-10-CM | POA: Diagnosis not present

## 2022-05-14 DIAGNOSIS — N5201 Erectile dysfunction due to arterial insufficiency: Secondary | ICD-10-CM | POA: Diagnosis not present

## 2022-05-14 DIAGNOSIS — R972 Elevated prostate specific antigen [PSA]: Secondary | ICD-10-CM | POA: Diagnosis not present

## 2022-05-14 NOTE — Therapy (Deleted)
OUTPATIENT PHYSICAL THERAPY THORACOLUMBAR EVALUATION   Patient Name: Todd Weeks MRN: [id number] DOB:[birth year], 66 y.o., male Today's Date: 05/14/2022    Past Medical History:  Diagnosis Date   Bell's palsy    Cataract    Mixed form OU   Closed nondisplaced fracture of neck of third metacarpal bone of right hand 02/04/2017   Clotting disorder ([place])    Depression    Diverticulitis    GERD (gastroesophageal reflux disease)    Headache(784.0)    HTN (hypertension)    Hyperlipidemia    Hypertensive retinopathy    OU   Mild CAD 2012   PVD (peripheral vascular disease) ([place])    Retinal detachment    Sexual dysfunction    Sleep apnea    Past Surgical History:  Procedure Laterality Date   ABDOMINAL AORTOGRAM W/LOWER EXTREMITY Bilateral 02/05/2020   Procedure: ABDOMINAL AORTOGRAM W/LOWER EXTREMITY;  Surgeon: Elam Dutch, MD;  Location: [facility];  Service: Cardiovascular;  Laterality: Bilateral;   ABDOMINAL AORTOGRAM W/LOWER EXTREMITY N/A 12/16/2020   Procedure: ABDOMINAL AORTOGRAM W/LOWER EXTREMITY;  Surgeon: Elam Dutch, MD;  Location: [facility];  Service: Cardiovascular;  Laterality: N/A;   CATARACT EXTRACTION     EYE SURGERY Right 05/06/2020   Pneumatic retinopexy for rheg. RD repair - Dr. Bernarda Caffey   EYE SURGERY Right 05/26/2020   PPV/MP - Dr. Bernarda Caffey   GAS INSERTION Right 05/26/2020   Procedure: INSERTION OF GAS;  Surgeon: Bernarda Caffey, MD;  Location: [facility];  Service: Ophthalmology;  Laterality: Right;   INGUINAL HERNIA REPAIR Right 05/29/2021   Procedure: RIGHT INGUINAL HERNIA REPAIR WITH MESH;  Surgeon: Jovita Kussmaul, MD;  Location: [facility];  Service: General;  Laterality: Right;  TAP BLOCK   LEFT HEART CATH AND CORONARY ANGIOGRAPHY N/A 04/05/2021   Procedure: LEFT HEART CATH AND CORONARY ANGIOGRAPHY;  Surgeon: Burnell Blanks, MD;  Location: [facility];  Service: Cardiovascular;  Laterality: N/A;    MEMBRANE PEEL Right 05/26/2020   Procedure: MEMBRANE PEEL;  Surgeon: Bernarda Caffey, MD;  Location: [facility];  Service: Ophthalmology;  Laterality: Right;   PARS PLANA VITRECTOMY Right 05/26/2020   Procedure: PARS PLANA VITRECTOMY WITH 25 GAUGE;  Surgeon: Bernarda Caffey, MD;  Location: [place];  Service: Ophthalmology;  Laterality: Right;   PERIPHERAL VASCULAR INTERVENTION Right 12/16/2020   Procedure: PERIPHERAL VASCULAR INTERVENTION;  Surgeon: Elam Dutch, MD;  Location: [facility];  Service: Cardiovascular;  Laterality: Right;  SFA (distal)   PHOTOCOAGULATION Right 05/26/2020   Procedure: PHOTOCOAGULATION;  Surgeon: Bernarda Caffey, MD;  Location: [facility];  Service: Ophthalmology;  Laterality: Right;   PROSTATE BIOPSY  2015   RETINAL DETACHMENT SURGERY Right 05/06/2020   Pneumatic retinopexy for rheg RD repair - Dr. Bernarda Caffey   RETINAL DETACHMENT SURGERY Right 05/26/2020   PPV/MP - Dr. Bernarda Caffey   Patient Active Problem List   Diagnosis Date Noted   Polyarthralgia 04/23/2022   Retinal detachment, rhegmatogenous, right eye 05/24/2020   Hyperglycemia 02/18/2020   BPH (benign prostatic hyperplasia) 08/21/2019   Depression, major, single episode, in partial remission ([place]) 08/04/2018   Anxiety 06/12/2018   GERD (gastroesophageal reflux disease) 05/01/2018   Insulin resistance 09/03/2017   Numbness of right anterior thigh 01/21/2017   Lesion or mass of paranasal sinuses 01/18/2016   Hyperlipidemia 05/04/2015   Atherosclerosis of native arteries of extremity with intermittent claudication ([place]) 05/22/2012   CAD (coronary artery disease) 02/02/2011  Elevated PSA 08/06/2007   Former smoker 08/01/2007   HTN (hypertension) 08/01/2007    PCP: Vivi Barrack, MD  REFERRING PROVIDER: Vivi Barrack, MD  REFERRING DIAG: M25.50 (ICD-10-CM) - Polyarthralgia  Rationale for Evaluation and Treatment Rehabilitation  THERAPY DIAG:  Weeks diagnosis found.  ONSET DATE:  ***  SUBJECTIVE:                                                                                                                                                                                           SUBJECTIVE STATEMENT: *** PERTINENT HISTORY:  anxiety, PVD, hx of heart cath  PAIN:  Are you having pain? Yes: NPRS scale: ***/10 Pain location: *** Pain description: *** Aggravating factors: *** Relieving factors: ***   PRECAUTIONS: {Therapy precautions:24002}  WEIGHT BEARING RESTRICTIONS {Yes ***/Weeks:24003}  FALLS:  Has patient fallen in last 6 months? {fallsyesno:27318}  LIVING ENVIRONMENT: Lives with: {OPRC lives with:25569::"lives with their family"} Lives in: {Lives in:25570} Stairs: {opstairs:27293} Has following equipment at home: {Assistive devices:23999}  OCCUPATION: ***  PLOF: {PLOF:24004}  PATIENT GOALS ***   OBJECTIVE:   DIAGNOSTIC FINDINGS:  ***  PATIENT SURVEYS:  {rehab surveys:24030}  SCREENING FOR RED FLAGS: Bowel or bladder incontinence: {Yes/Weeks:304960894} Spinal tumors: {Yes/Weeks:304960894} Cauda equina syndrome: {Yes/Weeks:304960894} Compression fracture: {Yes/Weeks:304960894} Abdominal aneurysm: {Yes/Weeks:304960894}  COGNITION:  Overall cognitive status: {cognition:24006}     SENSATION: {sensation:27233}  MUSCLE LENGTH: Hamstrings: Right *** deg; Left *** deg Thomas test: Right *** deg; Left *** deg  POSTURE: {posture:25561}  PALPATION: ***  LUMBAR ROM:   {AROM/PROM:27142}  A/PROM  eval  Flexion   Extension   Right lateral flexion   Left lateral flexion   Right rotation   Left rotation    (Blank rows = not tested)    LE Measurements Lower Extremity Right 05/14/2022 Left 05/14/2022   A/PROM MMT A/PROM MMT  Hip Flexion      Hip Extension      Hip Abduction      Hip Adduction      Hip Internal rotation      Hip External rotation      Knee Flexion      Knee Extension      Ankle Dorsiflexion      Ankle Plantarflexion       Ankle Inversion      Ankle Eversion       (Blank rows = not tested)  * pain  LUMBAR SPECIAL TESTS:  {lumbar special test:25242}  FUNCTIONAL TESTS:  {Functional tests:24029}  GAIT: Distance walked: *** Assistive device utilized: {Assistive devices:23999} Level of assistance: {Levels of assistance:24026} Comments: ***    TODAY'S TREATMENT  ***  PATIENT EDUCATION:  Education details: on current presentation, on HEP, on clinical outcomes score and POC Person educated: Patient Education method: Explanation, Demonstration, and Handouts Education comprehension: verbalized understanding   HOME EXERCISE PROGRAM: ***  ASSESSMENT:  CLINICAL IMPRESSION: Patient is a *** y.o. *** who was seen today for physical therapy evaluation and treatment for ***.    OBJECTIVE IMPAIRMENTS {opptimpairments:25111}.   ACTIVITY LIMITATIONS {activitylimitations:27494}  PARTICIPATION LIMITATIONS: {participationrestrictions:25113}  PERSONAL FACTORS {Personal factors:25162} are also affecting patient's functional outcome.   REHAB POTENTIAL: {rehabpotential:25112}  CLINICAL DECISION MAKING: {clinical decision making:25114}  EVALUATION COMPLEXITY: {Evaluation complexity:25115}  GOALS: Goals reviewed with patient?  yes  SHORT TERM GOALS:  Patient will be independent in self management strategies to improve quality of life and functional outcomes. Baseline: new program Target date: {follow up:25551} Goal status: INITIAL  2.  Patient will report at least 50% improvement in overall symptoms and/or function to demonstrate improved functional mobility Baseline: 0% Target date: {follow up:25551} Goal status: INITIAL  3.  *** Baseline: *** Target date: {follow up:25551} Goal status: INITIAL  4.  *** Baseline: *** Target date: {follow up:25551} Goal status: INITIAL     [place] TERM GOALS:  Patient will report at least 75% improvement in overall symptoms and/or function to  demonstrate improved functional mobility Baseline: 0% Target date: {follow up:25551} Goal status: INITIAL  2.  *** Baseline: *** Target date: {follow up:25551} Goal status: INITIAL  3.  *** Baseline: *** Target date: {follow up:25551} Goal status: INITIAL  4.  *** Baseline: *** Target date: {follow up:25551} Goal status: INITIAL    PLAN: PT FREQUENCY: {rehab frequency:25116}  PT DURATION: {rehab duration:25117}  PLANNED INTERVENTIONS: Therapeutic exercises, Therapeutic activity, Neuromuscular re-education, Balance training, Gait training, Patient/Family education, Self Care, Joint mobilization, Stair training, Vestibular training, Orthotic/Fit training, Prosthetic training, Aquatic Therapy, Dry Needling, Electrical stimulation, Spinal manipulation, Spinal mobilization, Cryotherapy, Moist heat, Ultrasound, Ionotophoresis '4mg'$ /ml Dexamethasone, Manual therapy, and Re-evaluation.  PLAN FOR NEXT SESSION: ***   7:59 AM, 05/14/22 Jerene Pitch, DPT Physical Therapy with [facility]

## 2022-05-14 NOTE — Telephone Encounter (Signed)
Pharmacist, [facility] at [place] [facility] Client Site [facility] Night Provider Dimas Chyle- MD Contact Type Call Who Is Calling Patient / Member / Family / Caregiver Caller Name Zarion Oliff Phone Number [phone] Patient Name Todd Weeks Patient DOB 09-28-1955 Call Type Message Only Information Provided Reason for Call Request to Reschedule Office Appointment Initial Comment Caller needs to reschedule his 8 AM appt with P.T. at office. His employer called him into work and he cannot make it. Patient request to speak to RN No Additional Comment Office hours provided. Declined triage. Asks to be rescheduled for next MON. Advised to callback. Disp. Time Disposition Final User 05/14/2022 7:33:03 AM General Information Provided Yes Gokounous, Erin Call Closed By: Candy Sledge Transaction Date/Time: 05/14/2022 7:29:37 AM (ET)

## 2022-05-16 ENCOUNTER — Other Ambulatory Visit: Payer: Self-pay | Admitting: Urology

## 2022-05-16 DIAGNOSIS — R972 Elevated prostate specific antigen [PSA]: Secondary | ICD-10-CM

## 2022-05-28 ENCOUNTER — Encounter: Payer: Self-pay | Admitting: Physical Therapy

## 2022-05-28 ENCOUNTER — Ambulatory Visit (INDEPENDENT_AMBULATORY_CARE_PROVIDER_SITE_OTHER): Payer: PPO | Admitting: Physical Therapy

## 2022-05-28 DIAGNOSIS — M2550 Pain in unspecified joint: Secondary | ICD-10-CM

## 2022-05-28 DIAGNOSIS — M6281 Muscle weakness (generalized): Secondary | ICD-10-CM

## 2022-05-28 NOTE — Therapy (Signed)
OUTPATIENT PHYSICAL THERAPY THORACOLUMBAR EVALUATION   Patient Name: Todd Weeks MRN: [id number] DOB:1956-06-18, 66 y.o., male Today's Date: 05/28/2022   PT End of Session - 05/28/22 1021     Visit Number 1    Number of Visits 12    Date for PT Re-Evaluation 08/20/22    PT Start Time 44    PT Stop Time 1103    PT Time Calculation (min) 42 min             Past Medical History:  Diagnosis Date   Bell's palsy    Cataract    Mixed form OU   Closed nondisplaced fracture of neck of third metacarpal bone of right hand 02/04/2017   Clotting disorder ([place])    Depression    Diverticulitis    GERD (gastroesophageal reflux disease)    Headache(784.0)    HTN (hypertension)    Hyperlipidemia    Hypertensive retinopathy    OU   Mild CAD 2012   PVD (peripheral vascular disease) ([place])    Retinal detachment    Sexual dysfunction    Sleep apnea    Past Surgical History:  Procedure Laterality Date   ABDOMINAL AORTOGRAM W/LOWER EXTREMITY Bilateral 02/05/2020   Procedure: ABDOMINAL AORTOGRAM W/LOWER EXTREMITY;  Surgeon: Elam Dutch, MD;  Location: [facility];  Service: Cardiovascular;  Laterality: Bilateral;   ABDOMINAL AORTOGRAM W/LOWER EXTREMITY N/A 12/16/2020   Procedure: ABDOMINAL AORTOGRAM W/LOWER EXTREMITY;  Surgeon: Elam Dutch, MD;  Location: [facility];  Service: Cardiovascular;  Laterality: N/A;   CATARACT EXTRACTION     EYE SURGERY Right 05/06/2020   Pneumatic retinopexy for rheg. RD repair - Dr. Bernarda Caffey   EYE SURGERY Right 05/26/2020   PPV/MP - Dr. Bernarda Caffey   GAS INSERTION Right 05/26/2020   Procedure: INSERTION OF GAS;  Surgeon: Bernarda Caffey, MD;  Location: [facility];  Service: Ophthalmology;  Laterality: Right;   INGUINAL HERNIA REPAIR Right 05/29/2021   Procedure: RIGHT INGUINAL HERNIA REPAIR WITH MESH;  Surgeon: Jovita Kussmaul, MD;  Location: [facility];  Service: General;  Laterality: Right;  TAP BLOCK   LEFT  HEART CATH AND CORONARY ANGIOGRAPHY N/A 04/05/2021   Procedure: LEFT HEART CATH AND CORONARY ANGIOGRAPHY;  Surgeon: Burnell Blanks, MD;  Location: [facility];  Service: Cardiovascular;  Laterality: N/A;   MEMBRANE PEEL Right 05/26/2020   Procedure: MEMBRANE PEEL;  Surgeon: Bernarda Caffey, MD;  Location: [facility];  Service: Ophthalmology;  Laterality: Right;   PARS PLANA VITRECTOMY Right 05/26/2020   Procedure: PARS PLANA VITRECTOMY WITH 25 GAUGE;  Surgeon: Bernarda Caffey, MD;  Location: [facility];  Service: Ophthalmology;  Laterality: Right;   PERIPHERAL VASCULAR INTERVENTION Right 12/16/2020   Procedure: PERIPHERAL VASCULAR INTERVENTION;  Surgeon: Elam Dutch, MD;  Location: [facility];  Service: Cardiovascular;  Laterality: Right;  SFA (distal)   PHOTOCOAGULATION Right 05/26/2020   Procedure: PHOTOCOAGULATION;  Surgeon: Bernarda Caffey, MD;  Location: [facility];  Service: Ophthalmology;  Laterality: Right;   PROSTATE BIOPSY  2015   RETINAL DETACHMENT SURGERY Right 05/06/2020   Pneumatic retinopexy for rheg RD repair - Dr. Bernarda Caffey   RETINAL DETACHMENT SURGERY Right 05/26/2020   PPV/MP - Dr. Bernarda Caffey   Patient Active Problem List   Diagnosis Date Noted   Polyarthralgia 04/23/2022   Retinal detachment, rhegmatogenous, right eye 05/24/2020   Hyperglycemia 02/18/2020   BPH (benign prostatic hyperplasia) 08/21/2019   Depression, major, single episode,  in partial remission ([place]) 08/04/2018   Anxiety 06/12/2018   GERD (gastroesophageal reflux disease) 05/01/2018   Insulin resistance 09/03/2017   Numbness of right anterior thigh 01/21/2017   Lesion or mass of paranasal sinuses 01/18/2016   Hyperlipidemia 05/04/2015   Atherosclerosis of native arteries of extremity with intermittent claudication ([place]) 05/22/2012   CAD (coronary artery disease) 02/02/2011   Elevated PSA 08/06/2007   Former smoker 08/01/2007   HTN (hypertension) 08/01/2007    PCP: Vivi Barrack,  MD  REFERRING PROVIDER: Vivi Barrack, MD  REFERRING DIAG: M25.50 (ICD-10-CM) - Polyarthralgia  Rationale for Evaluation and Treatment Rehabilitation  THERAPY DIAG:  Polyarthralgia  Muscle weakness (generalized)  ONSET DATE: 1 year ago  SUBJECTIVE:                                                                                                                                                                                           SUBJECTIVE STATEMENT: States his hips hurt at night and he is a side sleeper and constantly tossing and turning all night. Patient has lost about 30 pounds in the last 3-4  months. States he also has numbness in his arms with side sleeping as he extends his arm. Pillows between the knees and this doesn't help. States that the pain comes on about 10-15 minutes of laying on his side. Dull pain and this gets worse as he lays on his side. States he got a new bed last year as he had a soft bed previously and it did not help his back. State she has been taking the Voltaren pills and it has helped. States that he can only sleep for about 15 minutes at a time due to pain. Reports sometime she has groin cramping at night. Left side is worse than right    PERTINENT HISTORY:  anxiety, PVD, hx of heart cath, multiple stents in arteries on bilateral LE  PAIN:  Are you having pain? Yes: NPRS scale: 5 at night no current pain/10 Pain location: hips Pain description: dull achy Aggravating factors: laying on side Relieving factors: getting off his side and getting up   PRECAUTIONS: None  WEIGHT BEARING RESTRICTIONS No  FALLS:  Has patient fallen in last 6 months? 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    SCREENING FOR RED FLAGS: Bowel or bladder incontinence: No Spinal tumors: No Cauda  equina syndrome: No Compression fracture: No Abdominal aneurysm:  No  COGNITION:  Overall cognitive status: Within functional limits for tasks assessed     SENSATION: WFL    POSTURE: rounded shoulders and posterior pelvic tilt  PALPATION: Tenderness to palpation in bilateral hips/glutes. Hypomobility noted in PA of B hips L >R   LUMBAR ROM:   Active  A/PROM  eval  Flexion 75% limited  Extension 100% limited pain in back  Right lateral flexion 75% limited  Left lateral flexion 75% limited pain on left   Right rotation   Left rotation    (Blank rows = not tested)    LE Measurements Lower Extremity Right 05/28/2022 Left 05/28/2022   A/PROM MMT A/PROM MMT  Hip Flexion 100 3+* 95 3+*  Hip Extension      Hip Abduction      Hip Adduction      Hip Internal rotation 10  0   Hip External rotation 60  60   Knee Flexion  4-  3+  Knee Extension  5  5  Ankle Dorsiflexion  5  5  Ankle Plantarflexion      Ankle Inversion      Ankle Eversion       (Blank rows = not tested)  * pain  LUMBAR SPECIAL TESTS:  Straight leg raise test: Negative and Slump test: Negative Ely's test neg B     TODAY'S TREATMENT  05/28/2022  Therapeutic Exercise:  Aerobic: Supine: hip IR 2 minutes B Prone:  Seated: hip IR 2 minutes  Standing: Neuromuscular Re-education: Manual Therapy: Therapeutic Activity: Self Care: Trigger Point Dry Needling:  Modalities:   PATIENT EDUCATION:  Education details: on current presentation, on HEP, on clinical outcomes score and POC Person educated: Patient Education method: Explanation, Demonstration, and Handouts Education comprehension: verbalized understanding   HOME EXERCISE PROGRAM: [id number]  ASSESSMENT:  CLINICAL IMPRESSION: Patient presents with chronic hip pain that is worse with sleeping and side lying. Patient presents with weakness and limited hip IR that is likely contributing to current condition. Educated patient in current  presentation and POC moving forward. Patient would greatly benefit from skilled PT to improve overall sleep quality and QOL.    OBJECTIVE IMPAIRMENTS decreased mobility, decreased ROM, decreased strength, and pain.   ACTIVITY LIMITATIONS sleeping  PARTICIPATION LIMITATIONS: occupation  PERSONAL FACTORS 1-2 comorbidities: CAD, PVD  are also affecting patient's functional outcome.   REHAB POTENTIAL: Good  CLINICAL DECISION MAKING: Stable/uncomplicated  EVALUATION COMPLEXITY: Low  GOALS: Goals reviewed with patient?  yes  SHORT TERM GOALS:  Patient will be independent in self management strategies to improve quality of life and functional outcomes. Baseline: new program Target date: 07/09/2022 Goal status: INITIAL  2.  Patient will report at least 50% improvement in overall symptoms and/or function to demonstrate improved functional mobility Baseline: 0% Target date: 07/09/2022 Goal status: INITIAL  3.  Patient will be able to sleep for at least 60 minutes before pain wakes him up to improve sleep quality. Baseline: 15 minutes Target date: 07/09/2022 Goal status: INITIAL       [place] TERM GOALS:  Patient will report at least 75% improvement in overall symptoms and/or function to demonstrate improved functional mobility Baseline: 0% Target date: 08/20/2022 Goal status: INITIAL  2.  Patient will demonstrate at least 20 degrees of hip IR B to improve hip motion Baseline: see above Target date: 08/20/2022 Goal status: INITIAL  3.  Patient will be able report improved sleeping without waking up multiple times at night secondary to pain in hips to  improve sleep quality. Baseline: waking up multiple times a day Target date: 08/20/2022 Goal status: INITIAL      PLAN: PT FREQUENCY: 1-2x/week for total of 12 visits over 12 week certification period  PT DURATION: 12 weeks  PLANNED INTERVENTIONS: Therapeutic exercises, Therapeutic activity, Neuromuscular re-education,  Balance training, Gait training, Patient/Family education, Self Care, Joint mobilization, Stair training, Vestibular training, Orthotic/Fit training, Prosthetic training, Aquatic Therapy, Dry Needling, Electrical stimulation, Spinal manipulation, Spinal mobilization, Cryotherapy, Moist heat, Ultrasound, Ionotophoresis '4mg'$ /ml Dexamethasone, Manual therapy, and Re-evaluation.  PLAN FOR NEXT SESSION: hip ROM, hip mobilizations, isometrics   12:22 PM, 05/28/22 Jerene Pitch, DPT Physical Therapy with [facility]

## 2022-06-04 ENCOUNTER — Encounter: Payer: PPO | Admitting: Physical Therapy

## 2022-06-07 NOTE — Progress Notes (Shared)
[facility] Note  06/11/2022     CHIEF COMPLAINT Patient presents for No chief complaint on file.    HISTORY OF PRESENT ILLNESS: Todd Weeks is a 66 y.o. male who presents to the clinic today for:     Referring physician: Vivi Barrack, MD [place],  [place] [id number]  HISTORICAL INFORMATION:   Selected notes from the [place]: No current outpatient medications on file. (Ophthalmic Drugs)   No current facility-administered medications for this visit. (Ophthalmic Drugs)   Current Outpatient Medications (Other)  Medication Sig   ALPRAZolam (XANAX) 0.25 MG tablet Take 1 tablet (0.25 mg total) by mouth 2 (two) times daily as needed for anxiety.   aspirin EC 81 MG tablet Take 1 tablet (81 mg total) by mouth daily.   atorvastatin (LIPITOR) 40 MG tablet Take 1 tablet (40 mg total) by mouth daily.   clopidogrel (PLAVIX) 75 MG tablet Take 1 tablet (75 mg total) by mouth daily.   diclofenac (VOLTAREN) 75 MG EC tablet Take 1 tablet (75 mg total) by mouth 2 (two) times daily.   diclofenac Sodium (VOLTAREN) 1 % GEL Apply 1 application topically 3 (three) times daily as needed (leg cramps/spasms).   famotidine (PEPCID) 20 MG tablet Take 1 tablet (20 mg total) by mouth daily.   FLUoxetine (PROZAC) 40 MG capsule TAKE (1) CAPSULE BY MOUTH DAILY   irbesartan (AVAPRO) 300 MG tablet Take 1 tablet (300 mg total) by mouth daily.   nitroGLYCERIN (NITROSTAT) 0.4 MG SL tablet Place 0.4 mg under the tongue every 5 (five) minutes as needed for chest pain.   tamsulosin (FLOMAX) 0.4 MG CAPS capsule Take 1 capsule (0.4 mg total) by mouth daily.   No current facility-administered medications for this visit. (Other)   REVIEW OF SYSTEMS:   ALLERGIES No Known Allergies  PAST MEDICAL HISTORY Past Medical History:  Diagnosis Date   Bell's palsy    Cataract    Mixed form OU   Closed nondisplaced fracture of neck of  third metacarpal bone of right hand 02/04/2017   Clotting disorder ([place])    Depression    Diverticulitis    GERD (gastroesophageal reflux disease)    Headache(784.0)    HTN (hypertension)    Hyperlipidemia    Hypertensive retinopathy    OU   Mild CAD 2012   PVD (peripheral vascular disease) ([place])    Retinal detachment    Sexual dysfunction    Sleep apnea    Past Surgical History:  Procedure Laterality Date   ABDOMINAL AORTOGRAM W/LOWER EXTREMITY Bilateral 02/05/2020   Procedure: ABDOMINAL AORTOGRAM W/LOWER EXTREMITY;  Surgeon: Elam Dutch, MD;  Location: [place] [facility];  Service: Cardiovascular;  Laterality: Bilateral;   ABDOMINAL AORTOGRAM W/LOWER EXTREMITY N/A 12/16/2020   Procedure: ABDOMINAL AORTOGRAM W/LOWER EXTREMITY;  Surgeon: Elam Dutch, MD;  Location: [place] [facility];  Service: Cardiovascular;  Laterality: N/A;   CATARACT EXTRACTION     EYE SURGERY Right 05/06/2020   Pneumatic retinopexy for rheg. RD repair - Dr. Bernarda Caffey   EYE SURGERY Right 05/26/2020   PPV/MP - Dr. Bernarda Caffey   GAS INSERTION Right 05/26/2020   Procedure: INSERTION OF GAS;  Surgeon: Bernarda Caffey, MD;  Location: [place];  Service: Ophthalmology;  Laterality: Right;   INGUINAL HERNIA REPAIR Right 05/29/2021   Procedure: RIGHT INGUINAL HERNIA REPAIR WITH MESH;  Surgeon: Jovita Kussmaul, MD;  Location: [facility];  Service: General;  Laterality: Right;  TAP BLOCK   LEFT HEART CATH AND CORONARY ANGIOGRAPHY N/A 04/05/2021   Procedure: LEFT HEART CATH AND CORONARY ANGIOGRAPHY;  Surgeon: Burnell Blanks, MD;  Location: [place];  Service: Cardiovascular;  Laterality: N/A;   MEMBRANE PEEL Right 05/26/2020   Procedure: MEMBRANE PEEL;  Surgeon: Bernarda Caffey, MD;  Location: [place];  Service: Ophthalmology;  Laterality: Right;   PARS PLANA VITRECTOMY Right 05/26/2020   Procedure: PARS PLANA VITRECTOMY WITH 25 GAUGE;  Surgeon: Bernarda Caffey, MD;  Location: [place] [facility];  Service: Ophthalmology;  Laterality: Right;   PERIPHERAL VASCULAR INTERVENTION Right 12/16/2020   Procedure: PERIPHERAL VASCULAR INTERVENTION;  Surgeon: Elam Dutch, MD;  Location: [facility];  Service: Cardiovascular;  Laterality: Right;  SFA (distal)   PHOTOCOAGULATION Right 05/26/2020   Procedure: PHOTOCOAGULATION;  Surgeon: Bernarda Caffey, MD;  Location: [facility];  Service: Ophthalmology;  Laterality: Right;   PROSTATE BIOPSY  2015   RETINAL DETACHMENT SURGERY Right 05/06/2020   Pneumatic retinopexy for rheg RD repair - Dr. Bernarda Caffey   RETINAL DETACHMENT SURGERY Right 05/26/2020   PPV/MP - Dr. Bernarda Caffey   FAMILY HISTORY Family History  Problem Relation Age of Onset   Hypertension Mother    Deep vein thrombosis Father    Diabetes Brother    Tuberculosis Maternal Grandfather    Emphysema Maternal Grandfather    Lung disease Brother    Asthma Son    Rectal cancer Neg Hx    Colon cancer Neg Hx    SOCIAL HISTORY Social History   Tobacco Use   Smoking status: Former    Packs/day: 1.50    Years: 46.00    Total pack years: 69.00    Types: Cigarettes    Quit date: 12/12/2010    Years since quitting: 11.4   Smokeless tobacco: Never  Vaping Use   Vaping Use: Never used  Substance Use Topics   Alcohol use: Yes    Alcohol/week: 4.0 standard drinks of alcohol    Types: 4 Cans of beer per week    Comment: a couple of beer on a weekend   Drug use: Not Currently    Types: Cocaine, Marijuana, "Crack" cocaine    Comment: over 25 years ago       OPHTHALMIC EXAM:  Not recorded    IMAGING AND PROCEDURES  Imaging and Procedures for 06/11/2022          ASSESSMENT/PLAN:    ICD-10-CM   1. Right retinal detachment  H33.21     2. Epiretinal membrane (ERM) of right eye  H35.371     3. Retinal tear of left eye  H33.312     4. Essential hypertension  I10     5. Hypertensive retinopathy of both eyes  H35.033     6. Combined forms of age-related  cataract of left eye  H25.812     7. PCO (posterior capsular opacification), right  H26.491     8. Pseudophakia  Z96.1      1. Rhegmatogenous retinal detachment, OD - bullous superior mac off detachment, onset of foveal involvement Thurday, 09.23.21 by pt history - detached temporally from 0730 to 11 oclock, fovea off, horseshoe tear at 1030 - s/p pneumatic retinopexy w/ C3F8 OD 09.24.21 - supplemental intravitreal C3F8 gas injection OD (2.5cc on 10.06.21) **persistent shallow SRF remained due to ERM preventing full reattachment**  - s/p PPV/TissueBlue stain/MP/EL/FAX/14% C3F8 OD, 10.14.2021  - exam  and OCT show Retina stably reattached  - BCVA 20/25             - IOP good at 8   2. Epiretinal membrane, right eye  - +ERM w/ pucker likely impacting RD and SRF OD  - s/p PPV/TissueBlue stain/MP/EL/FAX/14% C3F8 OD, 10.14.2021 as above - ERM gone - OCT shows ERM gone and retinal thickening stably improved - monitor  3. Retinal tear, OS - horse shoe tear located at 0130 - s/p laser retinopexy OS 09.24.21 - good laser changes in place - no new RT/RD  4,5. Hypertensive retinopathy OU - discussed importance of tight BP control - monitor  6. Mixed Cataract OS - The symptoms of cataract, surgical options, and treatments and risks were discussed with patient. - discussed diagnosis and progression   7. Pseudophakia OD  - s/p CE/IOL OD (Dr. Brigitte Pulse, 02.07.22)  - IOL in good position, doing well - monitor  8. PCO OD - discussed treatment options - s/p Yag Cap OD (07.03.23) - f/u 2-3 weeks, DFE, OCT   Ophthalmic Meds Ordered this visit:  No orders of the defined types were placed in this encounter.    No follow-ups on file.  There are no Patient Instructions on file for this visit.  This document serves as a record of services personally performed by Gardiner Sleeper, MD, PhD. It was created on their behalf by San Jetty. Owens Shark, OA an ophthalmic technician. The creation of this  record is the provider's dictation and/or activities during the visit.    Electronically signed by: San Jetty. Owens Shark, [place] 10.26.2023 12:27 PM   Gardiner Sleeper, M.D., Ph.D. Diseases & Surgery of the Retina and Vitreous [facility]: M myopia (nearsighted); A astigmatism; H hyperopia (farsighted); P presbyopia; Mrx spectacle prescription;  CTL contact lenses; OD right eye; OS left eye; OU both eyes  XT exotropia; ET esotropia; PEK punctate epithelial keratitis; PEE punctate epithelial erosions; DES dry eye syndrome; MGD meibomian gland dysfunction; ATs artificial tears; PFAT's preservative free artificial tears; [place] nuclear sclerotic cataract; PSC posterior subcapsular cataract; ERM epi-retinal membrane; PVD posterior vitreous detachment; RD retinal detachment; DM diabetes mellitus; DR diabetic retinopathy; NPDR non-proliferative diabetic retinopathy; PDR proliferative diabetic retinopathy; CSME clinically significant macular edema; DME diabetic macular edema; dbh dot blot hemorrhages; CWS cotton wool spot; POAG primary open angle glaucoma; C/D cup-to-disc ratio; HVF humphrey visual field; GVF goldmann visual field; OCT optical coherence tomography; IOP intraocular pressure; BRVO Branch retinal vein occlusion; CRVO central retinal vein occlusion; CRAO central retinal artery occlusion; BRAO branch retinal artery occlusion; RT retinal tear; SB scleral buckle; PPV pars plana vitrectomy; VH Vitreous hemorrhage; PRP panretinal laser photocoagulation; IVK intravitreal kenalog; VMT vitreomacular traction; MH Macular hole;  NVD neovascularization of the disc; NVE neovascularization elsewhere; AREDS age related eye disease study; ARMD age related macular degeneration; POAG primary open angle glaucoma; EBMD epithelial/anterior basement membrane dystrophy; ACIOL anterior chamber intraocular lens; IOL intraocular lens; PCIOL posterior chamber intraocular lens; Phaco/IOL  phacoemulsification with intraocular lens placement; [place] photorefractive keratectomy; LASIK laser assisted in situ keratomileusis; HTN hypertension; DM diabetes mellitus; COPD chronic obstructive pulmonary disease

## 2022-06-11 ENCOUNTER — Encounter (INDEPENDENT_AMBULATORY_CARE_PROVIDER_SITE_OTHER): Payer: PPO | Admitting: Ophthalmology

## 2022-06-11 ENCOUNTER — Ambulatory Visit: Payer: PPO | Admitting: Physical Therapy

## 2022-06-11 ENCOUNTER — Encounter: Payer: Self-pay | Admitting: Physical Therapy

## 2022-06-11 DIAGNOSIS — H33312 Horseshoe tear of retina without detachment, left eye: Secondary | ICD-10-CM

## 2022-06-11 DIAGNOSIS — H26491 Other secondary cataract, right eye: Secondary | ICD-10-CM

## 2022-06-11 DIAGNOSIS — H25812 Combined forms of age-related cataract, left eye: Secondary | ICD-10-CM

## 2022-06-11 DIAGNOSIS — H35033 Hypertensive retinopathy, bilateral: Secondary | ICD-10-CM

## 2022-06-11 DIAGNOSIS — M6281 Muscle weakness (generalized): Secondary | ICD-10-CM

## 2022-06-11 DIAGNOSIS — H35371 Puckering of macula, right eye: Secondary | ICD-10-CM

## 2022-06-11 DIAGNOSIS — M2550 Pain in unspecified joint: Secondary | ICD-10-CM

## 2022-06-11 DIAGNOSIS — I10 Essential (primary) hypertension: Secondary | ICD-10-CM

## 2022-06-11 DIAGNOSIS — H3321 Serous retinal detachment, right eye: Secondary | ICD-10-CM

## 2022-06-11 DIAGNOSIS — Z961 Presence of intraocular lens: Secondary | ICD-10-CM

## 2022-06-11 NOTE — Therapy (Addendum)
OUTPATIENT PHYSICAL THERAPY TREATMENT NOTE   Patient Name: Todd Weeks MRN: [id number] DOB:[birth year], 66 y.o., male Today's Date: 06/11/2022  PHYSICAL THERAPY DISCHARGE SUMMARY  Visits from Start of Care: 2  Current functional level related to goals / functional outcomes: Unable to assess due to unplanned discharge    Remaining deficits: Unable to assess due to unplanned discharge    Education / Equipment: Unable to assess due to unplanned discharge    Patient agrees to discharge. Patient goals were not met. Patient is being discharged due to not returning since the last visit.  2:17 PM, 10/11/22 Jerene Pitch, DPT Physical Therapy with [facility]    END OF SESSION:   PT End of Session - 06/11/22 0757     Visit Number 2    Number of Visits 12    Date for PT Re-Evaluation 08/20/22    PT Start Time 0805    PT Stop Time [id number]    PT Time Calculation (min) 38 min             Past Medical History:  Diagnosis Date   Bell's palsy    Cataract    Mixed form OU   Closed nondisplaced fracture of neck of third metacarpal bone of right hand 02/04/2017   Clotting disorder ([place])    Depression    Diverticulitis    GERD (gastroesophageal reflux disease)    Headache(784.0)    HTN (hypertension)    Hyperlipidemia    Hypertensive retinopathy    OU   Mild CAD 2012   PVD (peripheral vascular disease) ([place])    Retinal detachment    Sexual dysfunction    Sleep apnea    Past Surgical History:  Procedure Laterality Date   ABDOMINAL AORTOGRAM W/LOWER EXTREMITY Bilateral 02/05/2020   Procedure: ABDOMINAL AORTOGRAM W/LOWER EXTREMITY;  Surgeon: Elam Dutch, MD;  Location: [facility];  Service: Cardiovascular;  Laterality: Bilateral;   ABDOMINAL AORTOGRAM W/LOWER EXTREMITY N/A 12/16/2020   Procedure: ABDOMINAL AORTOGRAM W/LOWER EXTREMITY;  Surgeon: Elam Dutch, MD;  Location: [facility];  Service: Cardiovascular;  Laterality: N/A;   CATARACT  EXTRACTION     EYE SURGERY Right 05/06/2020   Pneumatic retinopexy for rheg. RD repair - Dr. Bernarda Caffey   EYE SURGERY Right 05/26/2020   PPV/MP - Dr. Bernarda Caffey   GAS INSERTION Right 05/26/2020   Procedure: INSERTION OF GAS;  Surgeon: Bernarda Caffey, MD;  Location: [facility];  Service: Ophthalmology;  Laterality: Right;   INGUINAL HERNIA REPAIR Right 05/29/2021   Procedure: RIGHT INGUINAL HERNIA REPAIR WITH MESH;  Surgeon: Jovita Kussmaul, MD;  Location: [facility];  Service: General;  Laterality: Right;  TAP BLOCK   LEFT HEART CATH AND CORONARY ANGIOGRAPHY N/A 04/05/2021   Procedure: LEFT HEART CATH AND CORONARY ANGIOGRAPHY;  Surgeon: Burnell Blanks, MD;  Location: [facility];  Service: Cardiovascular;  Laterality: N/A;   MEMBRANE PEEL Right 05/26/2020   Procedure: MEMBRANE PEEL;  Surgeon: Bernarda Caffey, MD;  Location: [facility];  Service: Ophthalmology;  Laterality: Right;   PARS PLANA VITRECTOMY Right 05/26/2020   Procedure: PARS PLANA VITRECTOMY WITH 25 GAUGE;  Surgeon: Bernarda Caffey, MD;  Location: [facility];  Service: Ophthalmology;  Laterality: Right;   PERIPHERAL VASCULAR INTERVENTION Right 12/16/2020   Procedure: PERIPHERAL VASCULAR INTERVENTION;  Surgeon: Elam Dutch, MD;  Location: [facility];  Service: Cardiovascular;  Laterality: Right;  SFA (distal)   PHOTOCOAGULATION Right 05/26/2020   Procedure:  PHOTOCOAGULATION;  Surgeon: Bernarda Caffey, MD;  Location: [facility];  Service: Ophthalmology;  Laterality: Right;   PROSTATE BIOPSY  2015   RETINAL DETACHMENT SURGERY Right 05/06/2020   Pneumatic retinopexy for rheg RD repair - Dr. Bernarda Caffey   RETINAL DETACHMENT SURGERY Right 05/26/2020   PPV/MP - Dr. Bernarda Caffey   Patient Active Problem List   Diagnosis Date Noted   Polyarthralgia 04/23/2022   Retinal detachment, rhegmatogenous, right eye 05/24/2020   Hyperglycemia 02/18/2020   BPH (benign prostatic hyperplasia) 08/21/2019   Depression,  major, single episode, in partial remission ([place]) 08/04/2018   Anxiety 06/12/2018   GERD (gastroesophageal reflux disease) 05/01/2018   Insulin resistance 09/03/2017   Numbness of right anterior thigh 01/21/2017   Lesion or mass of paranasal sinuses 01/18/2016   Hyperlipidemia 05/04/2015   Atherosclerosis of native arteries of extremity with intermittent claudication ([place]) 05/22/2012   CAD (coronary artery disease) 02/02/2011   Elevated PSA 08/06/2007   Former smoker 08/01/2007   HTN (hypertension) 08/01/2007    PCP: Vivi Barrack, MD   REFERRING PROVIDER: Vivi Barrack, MD   REFERRING DIAG: M25.50 (ICD-10-CM) - Polyarthralgia   Rationale for Evaluation and Treatment Rehabilitation   THERAPY DIAG:  Polyarthralgia   Muscle weakness (generalized)   ONSET DATE: 1 year ago   SUBJECTIVE:                                                                                                                                                                                            SUBJECTIVE STATEMENT: 06/11/2022 States  he has been inconsistent with his exercise and that he is sleeping a little better.   Eval: States his hips hurt at night and he is a side sleeper and constantly tossing and turning all night. Patient has lost about 30 pounds in the last 3-4  months. States he also has numbness in his arms with side sleeping as he extends his arm. Pillows between the knees and this doesn't help. States that the pain comes on about 10-15 minutes of laying on his side. Dull pain and this gets worse as he lays on his side. States he got a new bed last year as he had a soft bed previously and it did not help his back. State she has been taking the Voltaren pills and it has helped. States that he can only sleep for about 15 minutes at a time due to pain. Reports sometime she has groin cramping at night. Left side is worse than right      PERTINENT HISTORY:  anxiety, PVD, hx of heart cath,  multiple stents in arteries  on bilateral LE   PAIN:  Are you having pain? Yes: NPRS scale: 4-5 at night no current pain/10 Pain location: hips Pain description: dull achy Aggravating factors: laying on side Relieving factors: getting off his side and getting up     PRECAUTIONS: None   WEIGHT BEARING RESTRICTIONS No   FALLS:  Has patient fallen in last 6 months? 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       SCREENING FOR RED FLAGS: Bowel or bladder incontinence: No Spinal tumors: No Cauda equina syndrome: No Compression fracture: No Abdominal aneurysm: No   COGNITION:           Overall cognitive status: Within functional limits for tasks assessed                          SENSATION: WFL       POSTURE: rounded shoulders and posterior pelvic tilt   PALPATION: Tenderness to palpation in bilateral hips/glutes. Hypomobility noted in PA of B hips L >R    LUMBAR ROM:    Active  A/PROM  eval  Flexion 75% limited  Extension 100% limited pain in back  Right lateral flexion 75% limited  Left lateral flexion 75% limited pain on left   Right rotation    Left rotation     (Blank rows = not tested)                LE Measurements       Lower Extremity Right 05/28/2022 Left 05/28/2022    A/PROM MMT A/PROM MMT  Hip Flexion 100 3+* 95 3+*  Hip Extension          Hip Abduction          Hip Adduction          Hip Internal rotation 10   0    Hip External rotation 60   60    Knee Flexion   4-   3+  Knee Extension   5   5  Ankle Dorsiflexion   5   5  Ankle Plantarflexion          Ankle Inversion          Ankle Eversion           (Blank rows = not tested)            * pain   LUMBAR SPECIAL TESTS:  Straight leg raise test: Negative and Slump test: Negative Ely's test neg B        TODAY'S  TREATMENT  06/11/2022    Therapeutic Exercise:    Aerobic: Supine  Prone:    Seated: hip IR 2 minutes, piriforms stretch knee to shoulder x3 30" holds B, piriformis knee away x3 30" holds B, hamstring stretch x3 30" holds, goddess pose x4 30" holds, clams with blue band 3x12 5" holds B    Standing: Neuromuscular Re-education: Manual Therapy: Therapeutic Activity: Self Care: Trigger Point Dry Needling:  Modalities:    PATIENT EDUCATION:  Education details: on HEP and anatomy Person educated: Patient Education method: Explanation, Demonstration, and Handouts Education comprehension: verbalized understanding     HOME EXERCISE PROGRAM: [id number]   ASSESSMENT:   CLINICAL IMPRESSION: 06/11/2022 Tolerated  session well. Focused on seated exercises secondary to patient being busy and not able to do other positional exercises as much. Tolerated all exercises well and reported really liked seated banded clamshell. Provided patient with blue band end of session and printed all exercises off for HEP adherence. Will continue with current POC as tolerated.  Eval: Patient presents with chronic hip pain that is worse with sleeping and side lying. Patient presents with weakness and limited hip IR that is likely contributing to current condition. Educated patient in current presentation and POC moving forward. Patient would greatly benefit from skilled PT to improve overall sleep quality and QOL.      OBJECTIVE IMPAIRMENTS decreased mobility, decreased ROM, decreased strength, and pain.    ACTIVITY LIMITATIONS sleeping   PARTICIPATION LIMITATIONS: occupation   PERSONAL FACTORS 1-2 comorbidities: CAD, PVD  are also affecting patient's functional outcome.    REHAB POTENTIAL: Good   CLINICAL DECISION MAKING: Stable/uncomplicated   EVALUATION COMPLEXITY: Low   GOALS: Goals reviewed with patient?  yes   SHORT TERM GOALS:   Patient will be independent in self management strategies to  improve quality of life and functional outcomes. Baseline: new program Target date: 07/09/2022 Goal status: INITIAL   2.  Patient will report at least 50% improvement in overall symptoms and/or function to demonstrate improved functional mobility Baseline: 0% Target date: 07/09/2022 Goal status: INITIAL   3.  Patient will be able to sleep for at least 60 minutes before pain wakes him up to improve sleep quality. Baseline: 15 minutes Target date: 07/09/2022 Goal status: INITIAL           [place] TERM GOALS:   Patient will report at least 75% improvement in overall symptoms and/or function to demonstrate improved functional mobility Baseline: 0% Target date: 08/20/2022 Goal status: INITIAL   2.  Patient will demonstrate at least 20 degrees of hip IR B to improve hip motion Baseline: see above Target date: 08/20/2022 Goal status: INITIAL   3.  Patient will be able report improved sleeping without waking up multiple times at night secondary to pain in hips to improve sleep quality. Baseline: waking up multiple times a day Target date: 08/20/2022 Goal status: INITIAL         PLAN: PT FREQUENCY: 1-2x/week for total of 12 visits over 12 week certification period   PT DURATION: 12 weeks   PLANNED INTERVENTIONS: Therapeutic exercises, Therapeutic activity, Neuromuscular re-education, Balance training, Gait training, Patient/Family education, Self Care, Joint mobilization, Stair training, Vestibular training, Orthotic/Fit training, Prosthetic training, Aquatic Therapy, Dry Needling, Electrical stimulation, Spinal manipulation, Spinal mobilization, Cryotherapy, Moist heat, Ultrasound, Ionotophoresis '4mg'$ /ml Dexamethasone, Manual therapy, and Re-evaluation.   PLAN FOR NEXT SESSION: hip ROM, hip mobilizations, isometrics     9:52 AM, 06/11/22 Jerene Pitch, DPT Physical Therapy with [facility]

## 2022-06-18 ENCOUNTER — Encounter: Payer: PPO | Admitting: Physical Therapy

## 2022-06-21 NOTE — Progress Notes (Deleted)
No chief complaint on file.    History of Present Illness: 66 yo male with history of HTN, hyperlipidemia, PAD, tobacco abuse and depression here today for cardiac follow up. I had followed him for PAD in the past. Cardiac cath in May 2012 with mild disease in the RCA and no disease in the LAD or Circumflex. Lower extremity angiogram May 2012 with 80% proximal superficial femoral artery stenosis. The left superficial femoral artery was occluded at the ostium. A stent was placed in the right SFA in September 2012. He was seen in the [facility] office. He has undergone stenting of the right SFA in May 2022. Flush occlusion of left SFA with reconstitution at the knee. He was admitted to [facility] September 2019 with dyspnea and chest pain. Troponin negative. Echo 04/26/18 with normal LV systolic function, HKVQ=25-95%. There were no wall motion abnormalities. No significant valve disease. Nuclear stress test 05/12/18 with no ischemia. Cardiac cath August 2022 with mild to moderate non-obstructive disease in the RCA.   He is here today for follow up. The patient denies any chest pain, dyspnea, palpitations, lower extremity edema, orthopnea, PND, dizziness, near syncope or syncope.   Primary Care Physician: Vivi Barrack, MD  Past Medical History:  Diagnosis Date   Bell's palsy    Cataract    Mixed form OU   Closed nondisplaced fracture of neck of third metacarpal bone of right hand 02/04/2017   Clotting disorder ([place])    Depression    Diverticulitis    GERD (gastroesophageal reflux disease)    Headache(784.0)    HTN (hypertension)    Hyperlipidemia    Hypertensive retinopathy    OU   Mild CAD 2012   PVD (peripheral vascular disease) ([place])    Retinal detachment    Sexual dysfunction    Sleep apnea     Past Surgical History:  Procedure Laterality Date   ABDOMINAL AORTOGRAM W/LOWER EXTREMITY Bilateral 02/05/2020   Procedure: ABDOMINAL AORTOGRAM W/LOWER EXTREMITY;  Surgeon: Elam Dutch, MD;   Location: [place] [facility];  Service: Cardiovascular;  Laterality: Bilateral;   ABDOMINAL AORTOGRAM W/LOWER EXTREMITY N/A 12/16/2020   Procedure: ABDOMINAL AORTOGRAM W/LOWER EXTREMITY;  Surgeon: Elam Dutch, MD;  Location: [facility];  Service: Cardiovascular;  Laterality: N/A;   CATARACT EXTRACTION     EYE SURGERY Right 05/06/2020   Pneumatic retinopexy for rheg. RD repair - Dr. Bernarda Caffey   EYE SURGERY Right 05/26/2020   PPV/MP - Dr. Bernarda Caffey   GAS INSERTION Right 05/26/2020   Procedure: INSERTION OF GAS;  Surgeon: Bernarda Caffey, MD;  Location: [place];  Service: Ophthalmology;  Laterality: Right;   INGUINAL HERNIA REPAIR Right 05/29/2021   Procedure: RIGHT INGUINAL HERNIA REPAIR WITH MESH;  Surgeon: Jovita Kussmaul, MD;  Location: [place];  Service: General;  Laterality: Right;  TAP BLOCK   LEFT HEART CATH AND CORONARY ANGIOGRAPHY N/A 04/05/2021   Procedure: LEFT HEART CATH AND CORONARY ANGIOGRAPHY;  Surgeon: Burnell Blanks, MD;  Location: [place] [facility];  Service: Cardiovascular;  Laterality: N/A;   MEMBRANE PEEL Right 05/26/2020   Procedure: MEMBRANE PEEL;  Surgeon: Bernarda Caffey, MD;  Location: [place];  Service: Ophthalmology;  Laterality: Right;   PARS PLANA VITRECTOMY Right 05/26/2020   Procedure: PARS PLANA VITRECTOMY WITH 25 GAUGE;  Surgeon: Bernarda Caffey, MD;  Location: [place];  Service: Ophthalmology;  Laterality: Right;   PERIPHERAL VASCULAR INTERVENTION Right 12/16/2020   Procedure: PERIPHERAL VASCULAR INTERVENTION;  Surgeon: Elam Dutch, MD;  Location: [place] [facility];  Service: Cardiovascular;  Laterality: Right;  SFA (distal)   PHOTOCOAGULATION Right 05/26/2020   Procedure: PHOTOCOAGULATION;  Surgeon: Bernarda Caffey, MD;  Location: [place];  Service: Ophthalmology;  Laterality: Right;   PROSTATE BIOPSY  2015   RETINAL DETACHMENT SURGERY Right 05/06/2020   Pneumatic retinopexy for rheg RD repair - Dr. Bernarda Caffey    RETINAL DETACHMENT SURGERY Right 05/26/2020   PPV/MP - Dr. Bernarda Caffey    Current Outpatient Medications  Medication Sig Dispense Refill   ALPRAZolam (XANAX) 0.25 MG tablet Take 1 tablet (0.25 mg total) by mouth 2 (two) times daily as needed for anxiety. 20 tablet 0   aspirin EC 81 MG tablet Take 1 tablet (81 mg total) by mouth daily. 150 tablet 2   atorvastatin (LIPITOR) 40 MG tablet Take 1 tablet (40 mg total) by mouth daily. 90 tablet 3   clopidogrel (PLAVIX) 75 MG tablet Take 1 tablet (75 mg total) by mouth daily. 90 tablet 3   diclofenac (VOLTAREN) 75 MG EC tablet Take 1 tablet (75 mg total) by mouth 2 (two) times daily. 30 tablet 0   diclofenac Sodium (VOLTAREN) 1 % GEL Apply 1 application topically 3 (three) times daily as needed (leg cramps/spasms).     famotidine (PEPCID) 20 MG tablet Take 1 tablet (20 mg total) by mouth daily. 90 tablet 3   FLUoxetine (PROZAC) 40 MG capsule TAKE (1) CAPSULE BY MOUTH DAILY 90 capsule 3   irbesartan (AVAPRO) 300 MG tablet Take 1 tablet (300 mg total) by mouth daily. 90 tablet 3   nitroGLYCERIN (NITROSTAT) 0.4 MG SL tablet Place 0.4 mg under the tongue every 5 (five) minutes as needed for chest pain.     tamsulosin (FLOMAX) 0.4 MG CAPS capsule Take 1 capsule (0.4 mg total) by mouth daily. 90 capsule 3   No current facility-administered medications for this visit.    No Known Allergies  Social History   Socioeconomic History   Marital status: Single    Spouse name: Not on file   Number of children: 2   Years of education: 12   Highest education level: Not on file  Occupational History   Occupation: Electrical engineer   Occupation: Unemployed    Comment: disability  Tobacco Use   Smoking status: Former    Packs/day: 1.50    Years: 46.00    Total pack years: 69.00    Types: Cigarettes    Quit date: 12/12/2010    Years since quitting: 11.5   Smokeless tobacco: Never  Vaping Use   Vaping Use: Never used  Substance and Sexual  Activity   Alcohol use: Yes    Alcohol/week: 4.0 standard drinks of alcohol    Types: 4 Cans of beer per week    Comment: a couple of beer on a weekend   Drug use: Not Currently    Types: Cocaine, Marijuana, "Crack" cocaine    Comment: over 25 years ago   Sexual activity: Not on file  Other Topics Concern   Not on file  Social History Narrative   Raised in [place], [place]. Does not have any religious beliefs that would effect healthcare. Lives in house with sister. Likes to ride motorcycle for fun.    Moving out on his own; in the next couple of weeks will be moving back home that he had rented      [place]; with a helmet   Social Determinants of Health  Financial Resource Strain: Low Risk  (09/04/2021)   Overall Financial Resource Strain (CARDIA)    Difficulty of Paying Living Expenses: Not hard at all  Food Insecurity: No Food Insecurity (09/04/2021)   Hunger Vital Sign    Worried About Running Out of Food in the Last Year: Never true    Ran Out of Food in the Last Year: Never true  Transportation Needs: No Transportation Needs (09/04/2021)   PRAPARE - Hydrologist (Medical): No    Lack of Transportation (Non-Medical): No  Physical Activity: Inactive (09/04/2021)   Exercise Vital Sign    Days of Exercise per Week: 0 days    Minutes of Exercise per Session: 0 min  Stress: Stress Concern Present (09/04/2021)   [place]    Feeling of Stress : To some extent  Social Connections: Socially Isolated (09/04/2021)   Social Connection and Isolation Panel [NHANES]    Frequency of Communication with Friends and Family: More than three times a week    Frequency of Social Gatherings with Friends and Family: Once a week    Attends Religious Services: Never    Marine scientist or Organizations: No    Attends Archivist Meetings: Never    Marital Status: Never married  Intimate  Partner Violence: Not At Risk (09/04/2021)   Humiliation, Afraid, Rape, and Kick questionnaire    Fear of Current or Ex-Partner: No    Emotionally Abused: No    Physically Abused: No    Sexually Abused: No    Family History  Problem Relation Age of Onset   Hypertension Mother    Deep vein thrombosis Father    Diabetes Brother    Tuberculosis Maternal Grandfather    Emphysema Maternal Grandfather    Lung disease Brother    Asthma Son    Rectal cancer Neg Hx    Colon cancer Neg Hx     Review of Systems:  As stated in the HPI and otherwise negative.   There were no vitals taken for this visit.  Physical Examination:  General: Well developed, well nourished, NAD  HEENT: OP clear, mucus membranes moist  SKIN: warm, dry. No rashes. Neuro: No focal deficits  Musculoskeletal: Muscle strength 5/5 all ext  Psychiatric: Mood and affect normal  Neck: No JVD, no carotid bruits, no thyromegaly, no lymphadenopathy.  Lungs:Clear bilaterally, no wheezes, rhonci, crackles Cardiovascular: Regular rate and rhythm. No murmurs, gallops or rubs. Abdomen:Soft. Bowel sounds present. Non-tender.  Extremities: No lower extremity edema. Pulses are 2 + in the bilateral DP/PT.  EKG:  EKG is *** ordered today. The ekg ordered today demonstrates   Echo 04/26/18: Left ventricle: The cavity size was normal. Systolic function was   normal. The estimated ejection fraction was in the range of 55%   to 60%. Wall motion was normal; there were no regional wall   motion abnormalities. - Aortic valve: Transvalvular velocity was within the normal range.   There was no stenosis. There was no significant regurgitation. - Mitral valve: There was trivial regurgitation. - Left atrium: The atrium was mildly dilated. - Right ventricle: Systolic function was normal. - Right atrium: The atrium was mildly dilated. - Atrial septum: No defect or patent foramen ovale was identified. - Tricuspid valve: There was trivial  regurgitation. - Pulmonic valve: There was no significant regurgitation. - Inferior vena cava: The vessel was normal in size. The   respirophasic diameter changes  were in the normal range (>= 50%),   consistent with normal central venous pressure.  Recent Labs: 04/23/2022: ALT 26; BUN 20; Creatinine, Ser 0.74; Hemoglobin 14.3; Platelets 241.0; Potassium 4.7; Sodium 139; TSH 2.65   Lipid Panel    Component Value Date/Time   CHOL 122 04/23/2022 0830   CHOL 137 06/11/2014 0000   TRIG 59.0 04/23/2022 0830   TRIG 173 06/11/2014 0000   HDL 50.30 04/23/2022 0830   CHOLHDL 2 04/23/2022 0830   VLDL 11.8 04/23/2022 0830   VLDL 35 06/11/2014 0000   LDLCALC 60 04/23/2022 0830     Wt Readings from Last 3 Encounters:  04/23/22 230 lb 12.8 oz (104.7 kg)  10/23/21 253 lb (114.8 kg)  09/04/21 261 lb 9.6 oz (118.7 kg)    Assessment and Plan:   1. CAD with angina: Mild CAD by cath in 2022. LV function is normal by echo in 2019. No chest pain. Continue ASA, Plavix and statin.   2. PAD: Followed in [facility].   3. Former Tobacco abuse: He stopped smoking in 2012.   4. Hyperlipidemia: LDL 60 in September 2023. Continue statin  5. HTN: BP is well controlled. No changes today  Labs/ tests ordered today include:  No orders of the defined types were placed in this encounter.  Disposition:   FU with me  in 12 months  Signed, [name], MD 06/21/2022 4:29 PM    [address], [place], [place]  [address] Phone: [phone]; Fax: [phone]

## 2022-06-22 ENCOUNTER — Ambulatory Visit: Payer: PPO | Admitting: Cardiovascular Disease

## 2022-06-25 ENCOUNTER — Other Ambulatory Visit: Payer: Self-pay | Admitting: Family Medicine

## 2022-06-25 ENCOUNTER — Encounter: Payer: PPO | Admitting: Physical Therapy

## 2022-07-02 ENCOUNTER — Encounter: Payer: PPO | Admitting: Physical Therapy

## 2022-07-09 ENCOUNTER — Encounter: Payer: PPO | Admitting: Physical Therapy

## 2022-07-09 ENCOUNTER — Ambulatory Visit
Admission: RE | Admit: 2022-07-09 | Discharge: 2022-07-09 | Disposition: A | Discharge status: Home / Self Care | Payer: PPO | Source: Ambulatory Visit | Attending: Urology | Admitting: Urology

## 2022-07-09 DIAGNOSIS — R972 Elevated prostate specific antigen [PSA]: Secondary | ICD-10-CM | POA: Diagnosis not present

## 2022-07-09 MED ORDER — GADOPICLENOL 0.5 MMOL/ML IV SOLN
10.0000 mL | Freq: Once | INTRAVENOUS | Status: AC | PRN
  Administered 2022-07-09: 10 mL via INTRAVENOUS

## 2022-07-16 ENCOUNTER — Encounter: Payer: PPO | Admitting: Physical Therapy

## 2022-08-09 ENCOUNTER — Ambulatory Visit (INDEPENDENT_AMBULATORY_CARE_PROVIDER_SITE_OTHER): Payer: PPO

## 2022-08-09 ENCOUNTER — Ambulatory Visit (INDEPENDENT_AMBULATORY_CARE_PROVIDER_SITE_OTHER): Payer: PPO | Admitting: Pulmonary Disease

## 2022-08-09 ENCOUNTER — Encounter: Payer: Self-pay | Admitting: Pulmonary Disease

## 2022-08-09 VITALS — BP 132/80 | HR 65 | Temp 97.7°F | Ht 72.0 in | Wt 240.0 lb

## 2022-08-09 DIAGNOSIS — R052 Subacute cough: Secondary | ICD-10-CM

## 2022-08-09 DIAGNOSIS — R059 Cough, unspecified: Secondary | ICD-10-CM | POA: Diagnosis not present

## 2022-08-09 DIAGNOSIS — G4733 Obstructive sleep apnea (adult) (pediatric): Secondary | ICD-10-CM | POA: Diagnosis not present

## 2022-08-09 DIAGNOSIS — J439 Emphysema, unspecified: Secondary | ICD-10-CM | POA: Diagnosis not present

## 2022-08-09 DIAGNOSIS — R0789 Other chest pain: Secondary | ICD-10-CM | POA: Diagnosis not present

## 2022-08-09 MED ORDER — PREDNISONE 20 MG PO TABS: 40.0000 mg | ORAL_TABLET | Freq: Every day | ORAL | 0 refills | Status: DC

## 2022-08-09 MED ORDER — DOXYCYCLINE HYCLATE 100 MG PO TABS: 100.0000 mg | ORAL_TABLET | Freq: Two times a day (BID) | ORAL | 0 refills | Status: DC

## 2022-08-09 NOTE — Addendum Note (Signed)
Addended by: June Leap on: 08/09/2022 11:50 AM   Modules accepted: Orders

## 2022-08-09 NOTE — Progress Notes (Signed)
Todd Weeks    [id number]    Feb 01, 1956  Primary Care Physician:Parker, [name], MD  Referring Physician: Vivi Barrack, [address] [place],  [place] [address]  Chief complaint:   Patient being seen for severe obstructive sleep apnea Intolerant of CPAP therapy Feeling poorly in the last couple of weeks  HPI:  Cough, chest pressure, exposure to people at work with a respiratory infection  No high fevers, no chills, Has difficulty getting a deep breath   He does have severe obstructive sleep apnea, not tolerating CPAP well Has not used CPAP in a while  We did talk about referral for an inspire device evaluation during the last visit in March, unable to make appointment because she was busy with family matters  -Sleep quality was much worse with trying to get used to CPAP  Diagnosed with severe obstructive sleep apnea  He does have daytime tiredness Usually goes to bed about 11 PM, takes him about an hour to fall asleep Multiple awakenings up to 6 times at night sometimes Usually tries to get out of bed by 6 AM  Tired during the day,  Works 12-hour shifts  Does have a caffeinated beverage in the morning  He does have siblings with sleep apnea  History of obstructive lung disease, coronary artery disease, hypertension  Outpatient Encounter Medications as of 08/09/2022  Medication Sig   aspirin EC 81 MG tablet Take 1 tablet (81 mg total) by mouth daily.   atorvastatin (LIPITOR) 40 MG tablet Take 1 tablet (40 mg total) by mouth daily.   clopidogrel (PLAVIX) 75 MG tablet Take 1 tablet (75 mg total) by mouth daily.   diclofenac (VOLTAREN) 75 MG EC tablet TAKE (1) TABLET BY MOUTH TWICE DAILY.   diclofenac Sodium (VOLTAREN) 1 % GEL Apply 1 application topically 3 (three) times daily as needed (leg cramps/spasms).   famotidine (PEPCID) 20 MG tablet Take 1 tablet (20 mg total) by mouth daily.   FLUoxetine (PROZAC) 40 MG capsule TAKE (1) CAPSULE BY MOUTH  DAILY   irbesartan (AVAPRO) 300 MG tablet Take 1 tablet (300 mg total) by mouth daily.   nitroGLYCERIN (NITROSTAT) 0.4 MG SL tablet Place 0.4 mg under the tongue every 5 (five) minutes as needed for chest pain.   tamsulosin (FLOMAX) 0.4 MG CAPS capsule Take 1 capsule (0.4 mg total) by mouth daily.   ALPRAZolam (XANAX) 0.25 MG tablet Take 1 tablet (0.25 mg total) by mouth 2 (two) times daily as needed for anxiety. (Patient not taking: Reported on 08/09/2022)   No facility-administered encounter medications on file as of 08/09/2022.    Allergies as of 08/09/2022   (No Known Allergies)    Past Medical History:  Diagnosis Date   Bell's palsy    Cataract    Mixed form OU   Closed nondisplaced fracture of neck of third metacarpal bone of right hand 02/04/2017   Clotting disorder ([place])    Depression    Diverticulitis    GERD (gastroesophageal reflux disease)    Headache(784.0)    HTN (hypertension)    Hyperlipidemia    Hypertensive retinopathy    OU   Mild CAD 2012   PVD (peripheral vascular disease) ([place])    Retinal detachment    Sexual dysfunction    Sleep apnea     Past Surgical History:  Procedure Laterality Date   ABDOMINAL AORTOGRAM W/LOWER EXTREMITY Bilateral 02/05/2020   Procedure: ABDOMINAL AORTOGRAM W/LOWER EXTREMITY;  Surgeon: Elam Dutch, MD;  Location: [place] [facility];  Service: Cardiovascular;  Laterality: Bilateral;   ABDOMINAL AORTOGRAM W/LOWER EXTREMITY N/A 12/16/2020   Procedure: ABDOMINAL AORTOGRAM W/LOWER EXTREMITY;  Surgeon: Elam Dutch, MD;  Location: [place] [facility];  Service: Cardiovascular;  Laterality: N/A;   CATARACT EXTRACTION     EYE SURGERY Right 05/06/2020   Pneumatic retinopexy for rheg. RD repair - Dr. Bernarda Caffey   EYE SURGERY Right 05/26/2020   PPV/MP - Dr. Bernarda Caffey   GAS INSERTION Right 05/26/2020   Procedure: INSERTION OF GAS;  Surgeon: Bernarda Caffey, MD;  Location: [employer] [facility];  Service: Ophthalmology;  Laterality: Right;    INGUINAL HERNIA REPAIR Right 05/29/2021   Procedure: RIGHT INGUINAL HERNIA REPAIR WITH MESH;  Surgeon: Jovita Kussmaul, MD;  Location: [place];  Service: General;  Laterality: Right;  TAP BLOCK   LEFT HEART CATH AND CORONARY ANGIOGRAPHY N/A 04/05/2021   Procedure: LEFT HEART CATH AND CORONARY ANGIOGRAPHY;  Surgeon: Burnell Blanks, MD;  Location: [place] [facility];  Service: Cardiovascular;  Laterality: N/A;   MEMBRANE PEEL Right 05/26/2020   Procedure: MEMBRANE PEEL;  Surgeon: Bernarda Caffey, MD;  Location: [place];  Service: Ophthalmology;  Laterality: Right;   PARS PLANA VITRECTOMY Right 05/26/2020   Procedure: PARS PLANA VITRECTOMY WITH 25 GAUGE;  Surgeon: Bernarda Caffey, MD;  Location: [place];  Service: Ophthalmology;  Laterality: Right;   PERIPHERAL VASCULAR INTERVENTION Right 12/16/2020   Procedure: PERIPHERAL VASCULAR INTERVENTION;  Surgeon: Elam Dutch, MD;  Location: [facility] [facility];  Service: Cardiovascular;  Laterality: Right;  SFA (distal)   PHOTOCOAGULATION Right 05/26/2020   Procedure: PHOTOCOAGULATION;  Surgeon: Bernarda Caffey, MD;  Location: [facility];  Service: Ophthalmology;  Laterality: Right;   PROSTATE BIOPSY  2015   RETINAL DETACHMENT SURGERY Right 05/06/2020   Pneumatic retinopexy for rheg RD repair - Dr. Bernarda Caffey   RETINAL DETACHMENT SURGERY Right 05/26/2020   PPV/MP - Dr. Bernarda Caffey    Family History  Problem Relation Age of Onset   Hypertension Mother    Deep vein thrombosis Father    Diabetes Brother    Tuberculosis Maternal Grandfather    Emphysema Maternal Grandfather    Lung disease Brother    Asthma Son    Rectal cancer Neg Hx    Colon cancer Neg Hx     Social History   Socioeconomic History   Marital status: Single    Spouse name: Not on file   Number of children: 2   Years of education: 12   Highest education level: Not on file  Occupational History   Occupation: Electrical engineer   Occupation:  Unemployed    Comment: disability  Tobacco Use   Smoking status: Former    Packs/day: 1.50    Years: 46.00    Total pack years: 69.00    Types: Cigarettes    Quit date: 12/12/2010    Years since quitting: 11.6   Smokeless tobacco: Never  Vaping Use   Vaping Use: Never used  Substance and Sexual Activity   Alcohol use: Yes    Alcohol/week: 4.0 standard drinks of alcohol    Types: 4 Cans of beer per week    Comment: a couple of beer on a weekend   Drug use: Not Currently    Types: Cocaine, Marijuana, "Crack" cocaine    Comment: over 25 years ago   Sexual activity: Not on file  Other Topics Concern  Not on file  Social History Narrative   Raised in [place], [place]. Does not have any religious beliefs that would effect healthcare. Lives in house with sister. Likes to ride motorcycle for fun.    Moving out on his own; in the next couple of weeks will be moving back home that he had rented      [place]; with a helmet   Social Determinants of Health   Financial Resource Strain: Low Risk  (09/04/2021)   Overall Financial Resource Strain (CARDIA)    Difficulty of Paying Living Expenses: Not hard at all  Food Insecurity: No Food Insecurity (09/04/2021)   Hunger Vital Sign    Worried About Running Out of Food in the Last Year: Never true    Ran Out of Food in the Last Year: Never true  Transportation Needs: No Transportation Needs (09/04/2021)   PRAPARE - Hydrologist (Medical): No    Lack of Transportation (Non-Medical): No  Physical Activity: Inactive (09/04/2021)   Exercise Vital Sign    Days of Exercise per Week: 0 days    Minutes of Exercise per Session: 0 min  Stress: Stress Concern Present (09/04/2021)   [place]    Feeling of Stress : To some extent  Social Connections: Socially Isolated (09/04/2021)   Social Connection and Isolation Panel [NHANES]    Frequency of Communication with  Friends and Family: More than three times a week    Frequency of Social Gatherings with Friends and Family: Once a week    Attends Religious Services: Never    Marine scientist or Organizations: No    Attends Archivist Meetings: Never    Marital Status: Never married  Intimate Partner Violence: Not At Risk (09/04/2021)   Humiliation, Afraid, Rape, and Kick questionnaire    Fear of Current or Ex-Partner: No    Emotionally Abused: No    Physically Abused: No    Sexually Abused: No    Review of Systems  Constitutional:  Positive for fatigue.  Psychiatric/Behavioral:  Positive for sleep disturbance.     Vitals:   08/09/22 1011  BP: 132/80  Pulse: 65  Temp: 97.7 F (36.5 C)  SpO2: 98%     Physical Exam Constitutional:      Appearance: He is obese.  HENT:     Head: Normocephalic.     Nose: Nose normal. No congestion.     Mouth/Throat:     Mouth: Mucous membranes are moist.     Comments: Mallampati 4, macroglossia Eyes:     Pupils: Pupils are equal, round, and reactive to light.  Cardiovascular:     Rate and Rhythm: Normal rate and regular rhythm.     Heart sounds: No murmur heard.    No friction rub.  Pulmonary:     Effort: No respiratory distress.     Breath sounds: No stridor. No wheezing or rhonchi.  Musculoskeletal:     Cervical back: No rigidity or tenderness.  Neurological:     Mental Status: He is alert.  Psychiatric:        Mood and Affect: Mood normal.     Data Reviewed: Sleep study reviewed showing severe obstructive sleep apnea with an AHI of 49, oxygen nadir of 75%  No CPAP compliance available  Assessment:  Severe obstructive sleep apnea -Intolerant of CPAP -Did try CPAP for about 5 months and not able to tolerate it -Has not been  using CPAP recently  Bronchitis Chest pressure, cough -Will call in a course of antibiotics doxycycline for 10 days and prednisone for 5 days -Obtain a chest x-ray today  For sleep apnea that  intolerant of CPAP -Will refer the patient to ENT for evaluation and discussion regarding an inspire device  Risk associated with untreated sleep disordered breathing was discussed  He is a reformed smoker -Does have a history of emphysema  History of hypertension History of coronary artery disease  Class I obesity  -Plan/Recommendations: Referral to ENT for evaluation for an inspire device  Continue weight loss efforts  Prescription for doxycycline, prescription for prednisone  Obtain a chest x-ray today  Follow-up in about 4 months   Sherrilyn Rist MD [facility] 08/09/2022, 10:37 AM  CC: Vivi Barrack, MD

## 2022-08-09 NOTE — Patient Instructions (Signed)
Prescription for doxycycline and prednisone will be sent to pharmacy for you  Chest x-ray today  Referral to ENT for evaluation and discussion about inspire device as an option of treatment for sleep apnea intolerant of CPAP  Tentative follow-up in 3 to 4 months

## 2022-08-17 ENCOUNTER — Other Ambulatory Visit: Payer: Self-pay | Admitting: *Deleted

## 2022-08-17 DIAGNOSIS — I70219 Atherosclerosis of native arteries of extremities with intermittent claudication, unspecified extremity: Secondary | ICD-10-CM

## 2022-08-17 DIAGNOSIS — I739 Peripheral vascular disease, unspecified: Secondary | ICD-10-CM

## 2022-08-27 ENCOUNTER — Ambulatory Visit: Payer: PPO | Admitting: Physician Assistant

## 2022-08-27 ENCOUNTER — Ambulatory Visit (INDEPENDENT_AMBULATORY_CARE_PROVIDER_SITE_OTHER): Payer: PPO | Admitting: Physician Assistant

## 2022-08-27 ENCOUNTER — Ambulatory Visit (HOSPITAL_COMMUNITY)
Admission: RE | Admit: 2022-08-27 | Discharge: 2022-08-27 | Disposition: A | Discharge status: Home / Self Care | Payer: PPO | Source: Ambulatory Visit | Attending: Surgery | Admitting: Surgery

## 2022-08-27 ENCOUNTER — Ambulatory Visit (INDEPENDENT_AMBULATORY_CARE_PROVIDER_SITE_OTHER)
Admission: RE | Admit: 2022-08-27 | Discharge: 2022-08-27 | Disposition: A | Discharge status: Home / Self Care | Payer: PPO | Source: Ambulatory Visit | Attending: Surgery | Admitting: Surgery

## 2022-08-27 ENCOUNTER — Encounter: Payer: Self-pay | Admitting: Physician Assistant

## 2022-08-27 VITALS — BP 122/70 | HR 77 | Temp 97.3°F | Ht 72.0 in | Wt 234.0 lb

## 2022-08-27 VITALS — BP 125/75 | HR 64 | Temp 97.7°F | Resp 18 | Ht 72.0 in | Wt 231.0 lb

## 2022-08-27 DIAGNOSIS — I739 Peripheral vascular disease, unspecified: Secondary | ICD-10-CM

## 2022-08-27 DIAGNOSIS — M79602 Pain in left arm: Secondary | ICD-10-CM

## 2022-08-27 DIAGNOSIS — I83893 Varicose veins of bilateral lower extremities with other complications: Secondary | ICD-10-CM | POA: Diagnosis not present

## 2022-08-27 DIAGNOSIS — I70219 Atherosclerosis of native arteries of extremities with intermittent claudication, unspecified extremity: Secondary | ICD-10-CM

## 2022-08-27 LAB — VAS US ABI WITH/WO TBI
Left ABI: 0.68
Right ABI: 0.88

## 2022-08-27 NOTE — Progress Notes (Signed)
Todd Weeks is a 67 y.o. male here for a new problem.  History of Present Illness:   Chief Complaint  Patient presents with   Arm Pain    Pt c/o left arm pain x 1 month, weakness. Also having numbness both arms at night. Pt has taken Tylenol with no relief.    HPI  Left arm pain Pain along left bicep extending through forearm. Pain progressively worsened since onset x1 month. Pain occurs at rest and with lifting with left arm, constant. He tried Tylenol for a few days without relief. He states that he has been coping with the pain. Has Voltaren gel at home which he can try again as it helped in the past. He is using a sleeve on his arm which has helped with the pain, especially while at work. Has some worsened pain with abduction of left shoulder. He denies any recent changes in his activities.  Patient reports bilateral hand tingling/numbness at night when sleeping. 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  He mentioned these symptoms at PT. He denies any neck pain. Patient thinks he has been evaluated by ortho in the past but is unsure who his provider was.  Denies chest pain, SOB  Past Medical History:  Diagnosis Date   Bell's palsy    Cataract    Mixed form OU   Closed nondisplaced fracture of neck of third metacarpal bone of right hand 02/04/2017   Clotting disorder ([place])    Depression    Diverticulitis    GERD (gastroesophageal reflux disease)    Headache(784.0)    HTN (hypertension)    Hyperlipidemia    Hypertensive retinopathy    OU   Mild CAD 2012   PVD (peripheral vascular disease) ([place])    Retinal detachment    Sexual dysfunction    Sleep apnea      Social History   Tobacco Use   Smoking status: Former    Packs/day: 1.50    Years: 46.00    Total pack years: 69.00    Types: Cigarettes    Quit date: 12/12/2010    Years since quitting: 11.7   Smokeless tobacco: Never  Vaping Use   Vaping Use:  Never used  Substance Use Topics   Alcohol use: Yes    Alcohol/week: 4.0 standard drinks of alcohol    Types: 4 Cans of beer per week    Comment: a couple of beer on a weekend   Drug use: Not Currently    Types: Cocaine, Marijuana, "Crack" cocaine    Comment: over 25 years ago    Past Surgical History:  Procedure Laterality Date   ABDOMINAL AORTOGRAM W/LOWER EXTREMITY Bilateral 02/05/2020   Procedure: ABDOMINAL AORTOGRAM W/LOWER EXTREMITY;  Surgeon: Elam Dutch, MD;  Location: [facility];  Service: Cardiovascular;  Laterality: Bilateral;   ABDOMINAL AORTOGRAM W/LOWER EXTREMITY N/A 12/16/2020   Procedure: ABDOMINAL AORTOGRAM W/LOWER EXTREMITY;  Surgeon: Elam Dutch, MD;  Location: [facility];  Service: Cardiovascular;  Laterality: N/A;   CATARACT EXTRACTION     EYE SURGERY Right 05/06/2020   Pneumatic retinopexy for rheg. RD repair - Dr. Bernarda Caffey   EYE SURGERY Right 05/26/2020   PPV/MP - Dr. Bernarda Caffey   GAS INSERTION Right 05/26/2020   Procedure: INSERTION OF GAS;  Surgeon: Bernarda Caffey, MD;  Location: [facility];  Service: Ophthalmology;  Laterality: Right;   INGUINAL HERNIA REPAIR Right  05/29/2021   Procedure: RIGHT INGUINAL HERNIA REPAIR WITH MESH;  Surgeon: Jovita Kussmaul, MD;  Location: [facility];  Service: General;  Laterality: Right;  TAP BLOCK   LEFT HEART CATH AND CORONARY ANGIOGRAPHY N/A 04/05/2021   Procedure: LEFT HEART CATH AND CORONARY ANGIOGRAPHY;  Surgeon: Burnell Blanks, MD;  Location: [facility];  Service: Cardiovascular;  Laterality: N/A;   MEMBRANE PEEL Right 05/26/2020   Procedure: MEMBRANE PEEL;  Surgeon: Bernarda Caffey, MD;  Location: [facility];  Service: Ophthalmology;  Laterality: Right;   PARS PLANA VITRECTOMY Right 05/26/2020   Procedure: PARS PLANA VITRECTOMY WITH 25 GAUGE;  Surgeon: Bernarda Caffey, MD;  Location: [facility];  Service: Ophthalmology;  Laterality: Right;   PERIPHERAL VASCULAR INTERVENTION  Right 12/16/2020   Procedure: PERIPHERAL VASCULAR INTERVENTION;  Surgeon: Elam Dutch, MD;  Location: [facility];  Service: Cardiovascular;  Laterality: Right;  SFA (distal)   PHOTOCOAGULATION Right 05/26/2020   Procedure: PHOTOCOAGULATION;  Surgeon: Bernarda Caffey, MD;  Location: [facility];  Service: Ophthalmology;  Laterality: Right;   PROSTATE BIOPSY  2015   RETINAL DETACHMENT SURGERY Right 05/06/2020   Pneumatic retinopexy for rheg RD repair - Dr. Bernarda Caffey   RETINAL DETACHMENT SURGERY Right 05/26/2020   PPV/MP - Dr. Bernarda Caffey    Family History  Problem Relation Age of Onset   Hypertension Mother    Deep vein thrombosis Father    Diabetes Brother    Tuberculosis Maternal Grandfather    Emphysema Maternal Grandfather    Lung disease Brother    Asthma Son    Rectal cancer Neg Hx    Colon cancer Neg Hx     No Known Allergies  Current Medications:   Current Outpatient Medications:    ALPRAZolam (XANAX) 0.25 MG tablet, Take 1 tablet (0.25 mg total) by mouth 2 (two) times daily as needed for anxiety., Disp: 20 tablet, Rfl: 0   aspirin EC 81 MG tablet, Take 1 tablet (81 mg total) by mouth daily., Disp: 150 tablet, Rfl: 2   atorvastatin (LIPITOR) 40 MG tablet, Take 1 tablet (40 mg total) by mouth daily., Disp: 90 tablet, Rfl: 3   clopidogrel (PLAVIX) 75 MG tablet, Take 1 tablet (75 mg total) by mouth daily., Disp: 90 tablet, Rfl: 3   diclofenac Sodium (VOLTAREN) 1 % GEL, Apply 1 application topically 3 (three) times daily as needed (leg cramps/spasms)., Disp: , Rfl:    famotidine (PEPCID) 20 MG tablet, Take 1 tablet (20 mg total) by mouth daily., Disp: 90 tablet, Rfl: 3   FLUoxetine (PROZAC) 40 MG capsule, TAKE (1) CAPSULE BY MOUTH DAILY, Disp: 90 capsule, Rfl: 3   irbesartan (AVAPRO) 300 MG tablet, Take 1 tablet (300 mg total) by mouth daily., Disp: 90 tablet, Rfl: 3   nitroGLYCERIN (NITROSTAT) 0.4 MG SL tablet, Place 0.4 mg under the tongue every 5 (five) minutes as  needed for chest pain., Disp: , Rfl:    tamsulosin (FLOMAX) 0.4 MG CAPS capsule, Take 1 capsule (0.4 mg total) by mouth daily., Disp: 90 capsule, Rfl: 3   Review of Systems:   Review of Systems  Constitutional:  Negative for fever.  HENT:  Negative for congestion, sinus pain and sore throat.   Respiratory:  Negative for cough, shortness of breath and wheezing.   Cardiovascular:  Negative for chest pain and palpitations.  Gastrointestinal:  Negative for blood in stool, constipation, diarrhea, nausea and vomiting.  Genitourinary:  Negative for dysuria, frequency and hematuria.  Musculoskeletal:  Positive for myalgias. Negative for neck pain.  Neurological:  Positive for sensory change and weakness.    Vitals:   Vitals:   08/27/22 0954  BP: 122/70  Pulse: 77  Temp: (!) 97.3 F (36.3 C)  TempSrc: Temporal  SpO2: 96%  Weight: 234 lb (106.1 kg)  Height: 6' (1.829 m)     Body mass index is 31.74 kg/m.  Physical Exam:   Physical Exam Vitals and nursing note reviewed.  Constitutional:      Appearance: He is well-developed.  HENT:     Head: Normocephalic.  Eyes:     Conjunctiva/sclera: Conjunctivae normal.     Pupils: Pupils are equal, round, and reactive to light.  Pulmonary:     Effort: Pulmonary effort is normal.  Musculoskeletal:        General: Normal range of motion.     Cervical back: Normal range of motion.     Comments: Left arm: Tenderness to left bicep and bicep tendon Pain elicited with resisted elbow flexion  Skin:    General: Skin is warm and dry.  Neurological:     Mental Status: He is alert and oriented to person, place, and time.     Comments: Grip strength 5/5 bilaterally  Psychiatric:        Behavior: Behavior normal.        Thought Content: Thought content normal.        Judgment: Judgment normal.     Assessment and Plan:   Left arm pain No red flags on exam or discussion Suspect possible bicep tendonitis Continue sleeve and trial topical  diclofenac Suspect he would be great a candidate for sports medicine for further evaluation - may need to return to [facility] with PT Will defer sports medicine to address his numbness/tingling that he has been experiencing for quite some time and has addressed with PT  I,Alexis Herring,acting as a scribe for Inda Coke, PA.,have documented all relevant documentation on the behalf of Inda Coke, PA,as directed by  Inda Coke, PA while in the presence of Inda Coke, [place].  I, Inda Coke, [place], have reviewed all documentation for this visit. The documentation on 08/27/22 for the exam, diagnosis, procedures, and orders are all accurate and complete.  Inda Coke, PA-C

## 2022-08-27 NOTE — Progress Notes (Signed)
Office Note     CC:  follow up Requesting Provider:  Vivi Barrack, MD  HPI: Todd Weeks is a 67 y.o. (05/22/1956) male who presents for surveillance of PAD.  He has a right SFA stent placed by Dr. Oneida Alar on 12/16/2020.  This was performed due to debilitating claudication.  He also has remote history of right iliac stents from an outside facility.  He has a known left SFA occlusion.  He states the claudication in his right leg completely resolved after SFA stent placement.  He has not had any return of symptoms since that time.  He denies any rest pain or tissue loss of bilateral lower extremities.  He is on aspirin, Plavix, statin daily.  He is a former smoker.  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  He does have varicose veins however venous symptoms are managed by regular use of compression socks purchased here.   Past Medical History:  Diagnosis Date   Bell's palsy    Cataract    Mixed form OU   Closed nondisplaced fracture of neck of third metacarpal bone of right hand 02/04/2017   Clotting disorder ([place])    Depression    Diverticulitis    GERD (gastroesophageal reflux disease)    Headache(784.0)    HTN (hypertension)    Hyperlipidemia    Hypertensive retinopathy    OU   Mild CAD 2012   PVD (peripheral vascular disease) ([place])    Retinal detachment    Sexual dysfunction    Sleep apnea     Past Surgical History:  Procedure Laterality Date   ABDOMINAL AORTOGRAM W/LOWER EXTREMITY Bilateral 02/05/2020   Procedure: ABDOMINAL AORTOGRAM W/LOWER EXTREMITY;  Surgeon: Elam Dutch, MD;  Location: [facility];  Service: Cardiovascular;  Laterality: Bilateral;   ABDOMINAL AORTOGRAM W/LOWER EXTREMITY N/A 12/16/2020   Procedure: ABDOMINAL AORTOGRAM W/LOWER EXTREMITY;  Surgeon: Elam Dutch, MD;  Location: [facility];  Service: Cardiovascular;  Laterality: N/A;   CATARACT EXTRACTION     EYE SURGERY Right 05/06/2020   Pneumatic retinopexy for rheg. RD  repair - Dr. Bernarda Caffey   EYE SURGERY Right 05/26/2020   PPV/MP - Dr. Bernarda Caffey   GAS INSERTION Right 05/26/2020   Procedure: INSERTION OF GAS;  Surgeon: Bernarda Caffey, MD;  Location: [facility];  Service: Ophthalmology;  Laterality: Right;   INGUINAL HERNIA REPAIR Right 05/29/2021   Procedure: RIGHT INGUINAL HERNIA REPAIR WITH MESH;  Surgeon: Jovita Kussmaul, MD;  Location: [facility];  Service: General;  Laterality: Right;  TAP BLOCK   LEFT HEART CATH AND CORONARY ANGIOGRAPHY N/A 04/05/2021   Procedure: LEFT HEART CATH AND CORONARY ANGIOGRAPHY;  Surgeon: Burnell Blanks, MD;  Location: [facility];  Service: Cardiovascular;  Laterality: N/A;   MEMBRANE PEEL Right 05/26/2020   Procedure: MEMBRANE PEEL;  Surgeon: Bernarda Caffey, MD;  Location: [facility];  Service: Ophthalmology;  Laterality: Right;   PARS PLANA VITRECTOMY Right 05/26/2020   Procedure: PARS PLANA VITRECTOMY WITH 25 GAUGE;  Surgeon: Bernarda Caffey, MD;  Location: [facility];  Service: Ophthalmology;  Laterality: Right;   PERIPHERAL VASCULAR INTERVENTION Right 12/16/2020   Procedure: PERIPHERAL VASCULAR INTERVENTION;  Surgeon: Elam Dutch, MD;  Location: [facility];  Service: Cardiovascular;  Laterality: Right;  SFA (distal)   PHOTOCOAGULATION Right 05/26/2020   Procedure: PHOTOCOAGULATION;  Surgeon: Bernarda Caffey, MD;  Location: [facility];  Service: Ophthalmology;  Laterality: Right;   PROSTATE  BIOPSY  2015   RETINAL DETACHMENT SURGERY Right 05/06/2020   Pneumatic retinopexy for rheg RD repair - Dr. Bernarda Caffey   RETINAL DETACHMENT SURGERY Right 05/26/2020   PPV/MP - Dr. Bernarda Caffey    Social History   Socioeconomic History   Marital status: Single    Spouse name: Not on file   Number of children: 2   Years of education: 12   Highest education level: Not on file  Occupational History   Occupation: Electrical engineer   Occupation: Unemployed    Comment: disability  Tobacco Use    Smoking status: Former    Packs/day: 1.50    Years: 46.00    Total pack years: 69.00    Types: Cigarettes    Quit date: 12/12/2010    Years since quitting: 11.7   Smokeless tobacco: Never  Vaping Use   Vaping Use: Never used  Substance and Sexual Activity   Alcohol use: Yes    Alcohol/week: 4.0 standard drinks of alcohol    Types: 4 Cans of beer per week    Comment: a couple of beer on a weekend   Drug use: Not Currently    Types: Cocaine, Marijuana, "Crack" cocaine    Comment: over 25 years ago   Sexual activity: Not on file  Other Topics Concern   Not on file  Social History Narrative   Raised in [place], [place]. Does not have any religious beliefs that would effect healthcare. Lives in house with sister. Likes to ride motorcycle for fun.    Moving out on his own; in the next couple of weeks will be moving back home that he had rented      [place]; with a helmet   Social Determinants of Health   Financial Resource Strain: Low Risk  (09/04/2021)   Overall Financial Resource Strain (CARDIA)    Difficulty of Paying Living Expenses: Not hard at all  Food Insecurity: No Food Insecurity (09/04/2021)   Hunger Vital Sign    Worried About Running Out of Food in the Last Year: Never true    Ran Out of Food in the Last Year: Never true  Transportation Needs: No Transportation Needs (09/04/2021)   PRAPARE - Hydrologist (Medical): No    Lack of Transportation (Non-Medical): No  Physical Activity: Inactive (09/04/2021)   Exercise Vital Sign    Days of Exercise per Week: 0 days    Minutes of Exercise per Session: 0 min  Stress: Stress Concern Present (09/04/2021)   [place]    Feeling of Stress : To some extent  Social Connections: Socially Isolated (09/04/2021)   Social Connection and Isolation Panel [NHANES]    Frequency of Communication with Friends and Family: More than three times a week     Frequency of Social Gatherings with Friends and Family: Once a week    Attends Religious Services: Never    Marine scientist or Organizations: No    Attends Archivist Meetings: Never    Marital Status: Never married  Intimate Partner Violence: Not At Risk (09/04/2021)   Humiliation, Afraid, Rape, and Kick questionnaire    Fear of Current or Ex-Partner: No    Emotionally Abused: No    Physically Abused: No    Sexually Abused: No    Family History  Problem Relation Age of Onset   Hypertension Mother    Deep vein thrombosis Father    Diabetes  Brother    Tuberculosis Maternal Grandfather    Emphysema Maternal Grandfather    Lung disease Brother    Asthma Son    Rectal cancer Neg Hx    Colon cancer Neg Hx     Current Outpatient Medications  Medication Sig Dispense Refill   ALPRAZolam (XANAX) 0.25 MG tablet Take 1 tablet (0.25 mg total) by mouth 2 (two) times daily as needed for anxiety. 20 tablet 0   aspirin EC 81 MG tablet Take 1 tablet (81 mg total) by mouth daily. 150 tablet 2   atorvastatin (LIPITOR) 40 MG tablet Take 1 tablet (40 mg total) by mouth daily. 90 tablet 3   clopidogrel (PLAVIX) 75 MG tablet Take 1 tablet (75 mg total) by mouth daily. 90 tablet 3   diclofenac Sodium (VOLTAREN) 1 % GEL Apply 1 application topically 3 (three) times daily as needed (leg cramps/spasms).     famotidine (PEPCID) 20 MG tablet Take 1 tablet (20 mg total) by mouth daily. 90 tablet 3   FLUoxetine (PROZAC) 40 MG capsule TAKE (1) CAPSULE BY MOUTH DAILY 90 capsule 3   irbesartan (AVAPRO) 300 MG tablet Take 1 tablet (300 mg total) by mouth daily. 90 tablet 3   nitroGLYCERIN (NITROSTAT) 0.4 MG SL tablet Place 0.4 mg under the tongue every 5 (five) minutes as needed for chest pain.     tamsulosin (FLOMAX) 0.4 MG CAPS capsule Take 1 capsule (0.4 mg total) by mouth daily. 90 capsule 3   No current facility-administered medications for this visit.    No Known Allergies   REVIEW  OF SYSTEMS:   '[X]'$  denotes positive finding, '[ ]'$  denotes negative finding Cardiac  Comments:  Chest pain or chest pressure:    Shortness of breath upon exertion:    Short of breath when lying flat:    Irregular heart rhythm:        Vascular    Pain in calf, thigh, or hip brought on by ambulation:    Pain in feet at night that wakes you up from your sleep:     Blood clot in your veins:    Leg swelling:         Pulmonary    Oxygen at home:    Productive cough:     Wheezing:         Neurologic    Sudden weakness in arms or legs:     Sudden numbness in arms or legs:     Sudden onset of difficulty speaking or slurred speech:    Temporary loss of vision in one eye:     Problems with dizziness:         Gastrointestinal    Blood in stool:     Vomited blood:         Genitourinary    Burning when urinating:     Blood in urine:        Psychiatric    Major depression:         Hematologic    Bleeding problems:    Problems with blood clotting too easily:        Skin    Rashes or ulcers:        Constitutional    Fever or chills:      PHYSICAL EXAMINATION:  Vitals:   08/27/22 1309  BP: 125/75  Pulse: 64  Resp: 18  Temp: 97.7 F (36.5 C)  TempSrc: Temporal  SpO2: 97%  Weight: 231 lb (104.8 kg)  Height: 6' (  1.829 m)    General:  WDWN in NAD; vital signs documented above Gait: Not observed HENT: WNL, normocephalic Pulmonary: normal non-labored breathing , without Rales, rhonchi,  wheezing Cardiac: regular HR Abdomen: soft, NT, no masses Skin: without rashes Vascular Exam/Pulses:  Right Left  DP 1+ (weak) absent  PT 2+ (normal) absent   Extremities: without ischemic changes, without Gangrene , without cellulitis; without open wounds;  Musculoskeletal: no muscle wasting or atrophy  Neurologic: A&O X 3;  No focal weakness or paresthesias are detected Psychiatric:  The pt has Normal affect.   Non-Invasive Vascular Imaging:    Right SFA stent patent;  proximal stent with 217 cm/s  ABI/TBIToday's ABIToday's TBIPrevious ABIPrevious TBI  +-------+-----------+-----------+------------+------------+  Right 0.88       0.47       1.13        0.71          +-------+-----------+-----------+------------+------------+  Left  0.68       0.46       0.81        0.65            ASSESSMENT/PLAN:: 67 y.o. male here for follow up for surveillance of PAD  -Subjectively patient is still without any claudication symptoms.  He has a known left SFA occlusion.  No indication for revascularization given lack of symptoms. -Right leg arterial duplex shows a widely patent SFA stent.  Repeat right leg arterial duplex and ABIs in 1 year -Continue aspirin, Plavix, statin daily -Continue regular use of compression stockings as well as periodic elevation of the legs due to varicose veins and likely venous insufficiency.   Dagoberto Ligas, PA-C [facility] [phone]  Clinic MD:   Trula Slade

## 2022-08-27 NOTE — Patient Instructions (Signed)
Trial the sleeve and topical voltaren  A referral has been placed for you to see one of our fantastic providers at [facility]. Someone from their office will be in touch soon regarding scheduling your appointment.  Their location:  [place] [facility]  [address] on the 1st floor Phone number [phone] Fax [phone].   This location is across the street from the entrance to [employer] and in the same complex as the [facility]

## 2022-08-30 NOTE — Progress Notes (Signed)
I, Peterson Lombard, LAT, ATC acting as a scribe for Lynne Leader, MD.  Subjective:    CC: L arm pain  HPI: Pt is a 66 y/o male c/o L arm pain ongoing for over a month, progressively worsening. Pt notes he is now having pain at rest and w/ activity. Pt locates pain to the anterior aspect of his L arm, from shoulder to wrist.   Pain is worse at the shoulder and lateral upper arm.  Pain is worse with reaching up and reaching back and at bedtime.   Neck pain: no Radiates: no UE Numbness/tingling: yes- only when sleeping at night UE Weakness: yes Aggravates: shoulder aBd, lifting objects Treatments tried: Tylenol, compression sleeve  Pertinent review of Systems: No fevers or chills  Relevant historical information: CAD.  Hypertension.  Insulin resistance.  Patient works as a Biomedical scientist   Objective:    Vitals:   09/03/22 0737  BP: 134/78  Pulse: 71  SpO2: 97%   General: Well Developed, well nourished, and in no acute distress.   MSK: C-spine: Normal appearing Nontender to palpation cervical midline. Normal cervical motion. Upper extremity strength mildly diminished left shoulder abduction as noted below otherwise intact.  Reflexes and sensation are intact throughout.  Left shoulder: Normal-appearing Tender palpation AC joint. Range of motion abduction limited active and passive 120 degrees with painful abduction. Internal rotation intact.  External rotation lacks 10 degrees of full external rotation. Strength abduction 4/5.  External/internal rotation strength are intact. Positive Hawkins and Neer's test.  Negative Yergason's and speeds test.  Pulses capillary refill and sensation are intact distally.  Lab and Radiology Results   Procedure: Real-time Ultrasound Guided Injection of left shoulder subacromial bursa Device: Philips Affiniti 50G Images permanently stored and available for review in PACS Ultrasound evaluation prior to injection reveals subacromial bursitis  with intact appearing rotator cuff tendons.  AC DJD with effusion is present. Verbal informed consent obtained.  Discussed risks and benefits of procedure. Warned about infection, bleeding, hyperglycemia damage to structures among others. Patient expresses understanding and agreement Time-out conducted.   Noted no overlying erythema, induration, or other signs of local infection.   Skin prepped in a sterile fashion.   Local anesthesia: Topical Ethyl chloride.   With sterile technique and under real time ultrasound guidance: 40 mg of Kenalog and 2 mg of Marcaine injected into subacromial bursa. Fluid seen entering the bursa.   Completed without difficulty   Pain immediately resolved suggesting accurate placement of the medication.   Advised to call if fevers/chills, erythema, induration, drainage, or persistent bleeding.   Images permanently stored and available for review in the ultrasound unit.  Impression: Technically successful ultrasound guided injection.    X-ray images C-spine and left shoulder obtained today personally and independently interpreted  C-spine: Multilevel DDD worse at C5-6.  No acute fractures are visible.  Left shoulder: Mild AC DJD.  No acute fractures.  Await formal radiology review    Impression and Recommendations:    Assessment and Plan: 67 y.o. male with left arm pain.  I believe the pain is multifactorial.  The majority of his pain is shoulder impingement bursitis related down his arm especially bedtime which I think of cervical radiculopathy which I do not think to be the dominant issue.  Plan for subacromial injection and referral to physical therapy.  Additionally trial of gabapentin at bedtime as needed.  Check back in about 6 weeks.Marland Kitchen  PDMP not reviewed this encounter. Orders Placed This Encounter  Procedures   DG Cervical Spine 2 or 3 views    Standing Status:   Future    Number of Occurrences:   1    Standing Expiration Date:   09/04/2023     Order Specific Question:   Reason for Exam (SYMPTOM  OR DIAGNOSIS REQUIRED)    Answer:   left arm pain    Order Specific Question:   Preferred imaging location?    Answer:   [facility]   [place] LIMITED JOINT SPACE STRUCTURES UP LEFT(NO LINKED CHARGES)    Order Specific Question:   Reason for Exam (SYMPTOM  OR DIAGNOSIS REQUIRED)    Answer:   left arm pain    Order Specific Question:   Preferred imaging location?    Answer:   [place]   DG Shoulder Left    Standing Status:   Future    Number of Occurrences:   1    Standing Expiration Date:   10/04/2022    Order Specific Question:   Reason for Exam (SYMPTOM  OR DIAGNOSIS REQUIRED)    Answer:   left arm pain    Order Specific Question:   Preferred imaging location?    Answer:   [facility]   Ambulatory referral to Physical Therapy    Referral Priority:   Routine    Referral Type:   Physical Medicine    Referral Reason:   Specialty Services Required    Requested Specialty:   Physical Therapy    Number of Visits Requested:   1   Meds ordered this encounter  Medications   gabapentin (NEURONTIN) 100 MG capsule    Sig: Take 1-3 capsules (100-300 mg total) by mouth at bedtime as needed.    Dispense:  90 capsule    Refill:  1    Discussed warning signs or symptoms. Please see discharge instructions. Patient expresses understanding.   The above documentation has been reviewed and is accurate and complete Lynne Leader, M.D.

## 2022-09-03 ENCOUNTER — Ambulatory Visit: Payer: Self-pay

## 2022-09-03 ENCOUNTER — Ambulatory Visit (INDEPENDENT_AMBULATORY_CARE_PROVIDER_SITE_OTHER): Payer: PPO

## 2022-09-03 ENCOUNTER — Ambulatory Visit: Payer: PPO | Admitting: Family Medicine

## 2022-09-03 VITALS — BP 134/78 | HR 71 | Ht 72.0 in | Wt 238.0 lb

## 2022-09-03 DIAGNOSIS — M25512 Pain in left shoulder: Secondary | ICD-10-CM | POA: Diagnosis not present

## 2022-09-03 DIAGNOSIS — M5412 Radiculopathy, cervical region: Secondary | ICD-10-CM

## 2022-09-03 DIAGNOSIS — M79602 Pain in left arm: Secondary | ICD-10-CM

## 2022-09-03 DIAGNOSIS — M50122 Cervical disc disorder at C5-C6 level with radiculopathy: Secondary | ICD-10-CM | POA: Diagnosis not present

## 2022-09-03 MED ORDER — GABAPENTIN 100 MG PO CAPS: 100.0000 mg | ORAL_CAPSULE | Freq: Every evening | ORAL | 1 refills | Status: DC | PRN

## 2022-09-03 NOTE — Patient Instructions (Addendum)
Thank you for coming in today.   Use the gabapentin at bedtime as needed for nerve pain.   Please get an Xray today before you leave   Call or go to the ER if you develop a large red swollen joint with extreme pain or oozing puss.    I've referred you to Physical Therapy.  Let us know if you don't hear from them in one week.   Recheck in 6 weeks.

## 2022-09-04 NOTE — Progress Notes (Signed)
X-ray cervical spine shows multilevel arthritis changes in the neck. We can see some calcifications of the arteries in your neck.  Based on the artery disease it may make sense to do ultrasound of your neck to look at that more accurately.  I believe that has the need to do with your pain but it may be a good idea.  I will talk to your primary care provider.

## 2022-09-04 NOTE — Progress Notes (Signed)
Left shoulder x-ray looks normal to radiology

## 2022-09-05 ENCOUNTER — Telehealth: Payer: Self-pay | Admitting: *Deleted

## 2022-09-05 DIAGNOSIS — I6529 Occlusion and stenosis of unspecified carotid artery: Secondary | ICD-10-CM

## 2022-09-05 DIAGNOSIS — I6523 Occlusion and stenosis of bilateral carotid arteries: Secondary | ICD-10-CM

## 2022-09-05 NOTE — Telephone Encounter (Signed)
-----  Message from Burnell Blanks, MD sent at 09/04/2022 12:54 PM EST ----- Regarding: RE: Carotid artery Doppler Shade Flood, We can order it through our office.  Elroy Schembri, Can you place an order for carotid artery dopplers for Mr. Swayne?  Thanks, Gerald Stabs  ----- Message ----- From: Gregor Hams, MD Sent: 09/04/2022   7:00 AM EST To: Burnell Blanks, MD; Vivi Barrack, MD Subject: Carotid artery Doppler                         I did a cervical spine x-ray to evaluate neck pain. I can see carotid artery arthrosclerosis on the x-ray.  What do you think about a carotid Doppler?  Do you want me to order it or will you?  Ellard Artis ----- Message ----- From: Interface, Rad Results In Sent: 09/03/2022   8:21 AM EST To: Gregor Hams, MD

## 2022-09-05 NOTE — Telephone Encounter (Signed)
Order for carotid artery duplex placed. He is overdue for visit with CM.  Called patient to informed and also scheduled his overdue follow up.

## 2022-09-10 ENCOUNTER — Ambulatory Visit (HOSPITAL_COMMUNITY)
Admission: RE | Admit: 2022-09-10 | Discharge: 2022-09-10 | Disposition: A | Discharge status: Home / Self Care | Payer: PPO | Source: Ambulatory Visit | Attending: Cardiovascular Disease | Admitting: Cardiovascular Disease

## 2022-09-10 DIAGNOSIS — I6529 Occlusion and stenosis of unspecified carotid artery: Secondary | ICD-10-CM | POA: Diagnosis not present

## 2022-09-11 ENCOUNTER — Other Ambulatory Visit: Payer: Self-pay | Admitting: *Deleted

## 2022-09-11 DIAGNOSIS — Z87891 Personal history of nicotine dependence: Secondary | ICD-10-CM

## 2022-09-11 DIAGNOSIS — Z122 Encounter for screening for malignant neoplasm of respiratory organs: Secondary | ICD-10-CM

## 2022-09-12 ENCOUNTER — Ambulatory Visit: Payer: PPO | Admitting: Cardiovascular Disease

## 2022-09-17 ENCOUNTER — Ambulatory Visit (INDEPENDENT_AMBULATORY_CARE_PROVIDER_SITE_OTHER): Payer: PPO

## 2022-09-17 VITALS — BP 128/76 | HR 76 | Temp 97.8°F | Wt 235.4 lb

## 2022-09-17 DIAGNOSIS — Z0000 Encounter for general adult medical examination without abnormal findings: Secondary | ICD-10-CM | POA: Diagnosis not present

## 2022-09-17 NOTE — Patient Instructions (Signed)
Todd Weeks , Thank you for taking time to come for your Medicare Wellness Visit. I appreciate your ongoing commitment to your health goals. Please review the following plan we discussed and let me know if I can assist you in the future.   These are the goals we discussed:  Goals      Patient Stated     Lose weight      Patient Stated     Lose 35 lbs      Weight (lb) < 260 lb (117.9 kg)     Would like to decrease weight to ultimate goal of 220 by increasing activity (joining [employer]).  Increase mobility.         This is a list of the screening recommended for you and due dates:  Health Maintenance  Topic Date Due   COVID-19 Vaccine (5 - 2023-24 season) 04/13/2022   Screening for Lung Cancer  10/09/2022   Flu Shot  11/11/2022*   Pneumonia Vaccine (2 - PCV) 04/17/2023   Medicare Annual Wellness Visit  09/18/2023   Colon Cancer Screening  10/07/2023   DTaP/Tdap/Td vaccine (2 - Td or Tdap) 12/12/2024   Hepatitis C Screening: USPSTF Recommendation to screen - Ages 89-79 yo.  Completed   Zoster (Shingles) Vaccine  Completed   HPV Vaccine  Aged Out  *Topic was postponed. The date shown is not the original due date.    Advanced directives: Please bring a copy of your health care power of attorney and living will to the office at your convenience.  Conditions/risks identified: get down to 200 lbs   Next appointment: Follow up in one year for your annual wellness visit.   Preventive Care 74 Years and Older, Male  Preventive care refers to lifestyle choices and visits with your health care provider that can promote health and wellness. What does preventive care include? A yearly physical exam. This is also called an annual well check. Dental exams once or twice a year. Routine eye exams. Ask your health care provider how often you should have your eyes checked. Personal lifestyle choices, including: Daily care of your teeth and gums. Regular physical activity. Eating a healthy  diet. Avoiding tobacco and drug use. Limiting alcohol use. Practicing safe sex. Taking low doses of aspirin every day. Taking vitamin and mineral supplements as recommended by your health care provider. What happens during an annual well check? The services and screenings done by your health care provider during your annual well check will depend on your age, overall health, lifestyle risk factors, and family history of disease. Counseling  Your health care provider may ask you questions about your: Alcohol use. Tobacco use. Drug use. Emotional well-being. Home and relationship well-being. Sexual activity. Eating habits. History of falls. Memory and ability to understand (cognition). Work and work Statistician. Screening  You may have the following tests or measurements: Height, weight, and BMI. Blood pressure. Lipid and cholesterol levels. These may be checked every 5 years, or more frequently if you are over 60 years old. Skin check. Lung cancer screening. You may have this screening every year starting at age 42 if you have a 30-pack-year history of smoking and currently smoke or have quit within the past 15 years. Fecal occult blood test (FOBT) of the stool. You may have this test every year starting at age 45. Flexible sigmoidoscopy or colonoscopy. You may have a sigmoidoscopy every 5 years or a colonoscopy every 10 years starting at age 67. Prostate cancer screening. Recommendations  will vary depending on your family history and other risks. Hepatitis C blood test. Hepatitis B blood test. Sexually transmitted disease (STD) testing. Diabetes screening. This is done by checking your blood sugar (glucose) after you have not eaten for a while (fasting). You may have this done every 1-3 years. Abdominal aortic aneurysm (AAA) screening. You may need this if you are a current or former smoker. Osteoporosis. You may be screened starting at age 90 if you are at high risk. Talk with  your health care provider about your test results, treatment options, and if necessary, the need for more tests. Vaccines  Your health care provider may recommend certain vaccines, such as: Influenza vaccine. This is recommended every year. Tetanus, diphtheria, and acellular pertussis (Tdap, Td) vaccine. You may need a Td booster every 10 years. Zoster vaccine. You may need this after age 74. Pneumococcal 13-valent conjugate (PCV13) vaccine. One dose is recommended after age 78. Pneumococcal polysaccharide (PPSV23) vaccine. One dose is recommended after age 20. Talk to your health care provider about which screenings and vaccines you need and how often you need them. This information is not intended to replace advice given to you by your health care provider. Make sure you discuss any questions you have with your health care provider. Document Released: 08/26/2015 Document Revised: 04/18/2016 Document Reviewed: 05/31/2015 Elsevier Interactive Patient Education  2017 [place] Prevention in the Home Falls can cause injuries. They can happen to people of all ages. There are many things you can do to make your home safe and to help prevent falls. What can I do on the outside of my home? Regularly fix the edges of walkways and driveways and fix any cracks. Remove anything that might make you trip as you walk through a door, such as a raised step or threshold. Trim any bushes or trees on the path to your home. Use bright outdoor lighting. Clear any walking paths of anything that might make someone trip, such as rocks or tools. Regularly check to see if handrails are loose or broken. Make sure that both sides of any steps have handrails. Any raised decks and porches should have guardrails on the edges. Have any leaves, snow, or ice cleared regularly. Use sand or salt on walking paths during winter. Clean up any spills in your garage right away. This includes oil or grease spills. What  can I do in the bathroom? Use night lights. Install grab bars by the toilet and in the tub and shower. Do not use towel bars as grab bars. Use non-skid mats or decals in the tub or shower. If you need to sit down in the shower, use a plastic, non-slip stool. Keep the floor dry. Clean up any water that spills on the floor as soon as it happens. Remove soap buildup in the tub or shower regularly. Attach bath mats securely with double-sided non-slip rug tape. Do not have throw rugs and other things on the floor that can make you trip. What can I do in the bedroom? Use night lights. Make sure that you have a light by your bed that is easy to reach. Do not use any sheets or blankets that are too big for your bed. They should not hang down onto the floor. Have a firm chair that has side arms. You can use this for support while you get dressed. Do not have throw rugs and other things on the floor that can make you trip. What can I do  in the kitchen? Clean up any spills right away. Avoid walking on wet floors. Keep items that you use a lot in easy-to-reach places. If you need to reach something above you, use a strong step stool that has a grab bar. Keep electrical cords out of the way. Do not use floor polish or wax that makes floors slippery. If you must use wax, use non-skid floor wax. Do not have throw rugs and other things on the floor that can make you trip. What can I do with my stairs? Do not leave any items on the stairs. Make sure that there are handrails on both sides of the stairs and use them. Fix handrails that are broken or loose. Make sure that handrails are as [place] as the stairways. Check any carpeting to make sure that it is firmly attached to the stairs. Fix any carpet that is loose or worn. Avoid having throw rugs at the top or bottom of the stairs. If you do have throw rugs, attach them to the floor with carpet tape. Make sure that you have a light switch at the top of the  stairs and the bottom of the stairs. If you do not have them, ask someone to add them for you. What else can I do to help prevent falls? Wear shoes that: Do not have high heels. Have rubber bottoms. Are comfortable and fit you well. Are closed at the toe. Do not wear sandals. If you use a stepladder: Make sure that it is fully opened. Do not climb a closed stepladder. Make sure that both sides of the stepladder are locked into place. Ask someone to hold it for you, if possible. Clearly mark and make sure that you can see: Any grab bars or handrails. First and last steps. Where the edge of each step is. Use tools that help you move around (mobility aids) if they are needed. These include: Canes. Walkers. Scooters. Crutches. Turn on the lights when you go into a dark area. Replace any light bulbs as soon as they burn out. Set up your furniture so you have a clear path. Avoid moving your furniture around. If any of your floors are uneven, fix them. If there are any pets around you, be aware of where they are. Review your medicines with your doctor. Some medicines can make you feel dizzy. This can increase your chance of falling. Ask your doctor what other things that you can do to help prevent falls. This information is not intended to replace advice given to you by your health care provider. Make sure you discuss any questions you have with your health care provider. Document Released: 05/26/2009 Document Revised: 01/05/2016 Document Reviewed: 09/03/2014 Elsevier Interactive Patient Education  2017 [employer].

## 2022-09-17 NOTE — Progress Notes (Signed)
Subjective:   Todd Weeks is a 67 y.o. male who presents for Medicare Annual/Subsequent preventive examination.  Review of Systems     Cardiac Risk Factors include: advanced age (>46mn, >>65women);dyslipidemia;obesity (BMI >30kg/m2);hypertension;male gender     Objective:    Today's Vitals   09/17/22 0802  BP: 128/76  Pulse: 76  Temp: 97.8 F (36.6 C)  SpO2: 98%  Weight: 235 lb 6.4 oz (106.8 kg)   Body mass index is 31.93 kg/m.     09/17/2022    8:08 AM 05/28/2022   10:34 AM 09/04/2021    8:11 AM 05/29/2021   11:56 AM 05/19/2021    2:48 PM 04/05/2021    6:51 AM 12/17/2020    6:58 AM  Advanced Directives  Does Patient Have a Medical Advance Directive? No No Yes No No No No  Type of Advance Directive   [place]     Does patient want to make changes to medical advance directive?   Yes (MAU/Ambulatory/Procedural Areas - Information given)      Copy of [facility] Chart?   No - copy requested      Would patient like information on creating a medical advance directive? No - Patient declined No - Patient declined  No - Patient declined No - Patient declined No - Patient declined     Current Medications (verified) Outpatient Encounter Medications as of 09/17/2022  Medication Sig   aspirin EC 81 MG tablet Take 1 tablet (81 mg total) by mouth daily.   atorvastatin (LIPITOR) 40 MG tablet Take 1 tablet (40 mg total) by mouth daily.   clopidogrel (PLAVIX) 75 MG tablet Take 1 tablet (75 mg total) by mouth daily.   diclofenac Sodium (VOLTAREN) 1 % GEL Apply 1 application topically 3 (three) times daily as needed (leg cramps/spasms).   famotidine (PEPCID) 20 MG tablet Take 1 tablet (20 mg total) by mouth daily.   FLUoxetine (PROZAC) 40 MG capsule TAKE (1) CAPSULE BY MOUTH DAILY   gabapentin (NEURONTIN) 100 MG capsule Take 1-3 capsules (100-300 mg total) by mouth at bedtime as needed.   irbesartan (AVAPRO) 300 MG tablet Take 1 tablet (300 mg  total) by mouth daily.   nitroGLYCERIN (NITROSTAT) 0.4 MG SL tablet Place 0.4 mg under the tongue every 5 (five) minutes as needed for chest pain.   tamsulosin (FLOMAX) 0.4 MG CAPS capsule Take 1 capsule (0.4 mg total) by mouth daily.   ALPRAZolam (XANAX) 0.25 MG tablet Take 1 tablet (0.25 mg total) by mouth 2 (two) times daily as needed for anxiety. (Patient not taking: Reported on 09/17/2022)   No facility-administered encounter medications on file as of 09/17/2022.    Allergies (verified) Patient has no known allergies.   History: Past Medical History:  Diagnosis Date   Bell's palsy    Cataract    Mixed form OU   Closed nondisplaced fracture of neck of third metacarpal bone of right hand 02/04/2017   [place]ot[place] disorder [place]    Depression    Diverticulitis    GERD (gastroesophageal reflux disease)    Headache(784.0)    HTN (hypertension)    Hyperlipidemia    Hypertensive retinopathy    OU   Mild CAD 2012   PVD (peripheral vascular disease) ([facility]    Retinal detachment    Sexual dysfunction    Sleep apnea    Past Surgical History:  Procedure Laterality Date   ABDOMINAL AORTOGRAM W/LOWER EXTREMITY Bilateral 02/05/2020  Procedure: ABDOMINAL AORTOGRAM W/LOWER EXTREMITY;  Surgeon: Elam Dutch, MD;  Location: [facility];  Service: Cardiovascular;  Laterality: Bilateral;   ABDOMINAL AORTOGRAM W/LOWER EXTREMITY N/A 12/16/2020   Procedure: ABDOMINAL AORTOGRAM W/LOWER EXTREMITY;  Surgeon: Elam Dutch, MD;  Location: [facility];  Service: Cardiovascular;  Laterality: N/A;   CATARACT EXTRACTION     EYE SURGERY Right 05/06/2020   Pneumatic retinopexy for rheg. RD repair - Dr. Bernarda Caffey   EYE SURGERY Right 05/26/2020   PPV/MP - Dr. Bernarda Caffey   GAS INSERTION Right 05/26/2020   Procedure: INSERTION OF GAS;  Surgeon: Bernarda Caffey, MD;  [facility];  Service: Ophthalmology;  Laterality: Right;   INGUINAL HERNIA REPAIR Right 05/29/2021   Procedure:  RIGHT INGUINAL HERNIA REPAIR WITH MESH;  Surgeon: Jovita Kussmaul, MD;  Location: [facility];  Service: General;  Laterality: Right;  TAP BLOCK   LEFT HEART CATH AND CORONARY ANGIOGRAPHY N/A 04/05/2021   Procedure: LEFT HEART CATH AND CORONARY ANGIOGRAPHY;  Surgeon: Burnell Blanks, MD;  Location: [facility];  Service: Cardiovascular;  Laterality: N/A;   MEMBRANE PEEL Right 05/26/2020   Procedure: MEMBRANE PEEL;  Surgeon: Bernarda Caffey, MD;  Location: [facility];  Service: Ophthalmology;  Laterality: Right;   PARS PLANA VITRECTOMY Right 05/26/2020   Procedure: PARS PLANA VITRECTOMY WITH 25 GAUGE;  Surgeon: Bernarda Caffey, MD;  Location: [facility];  Service: Ophthalmology;  Laterality: Right;   PERIPHERAL VASCULAR INTERVENTION Right 12/16/2020   Procedure: PERIPHERAL VASCULAR INTERVENTION;  Surgeon: Elam Dutch, MD;  Location: [facility];  Service: Cardiovascular;  Laterality: Right;  SFA (distal)   PHOTOCOAGULATION Right 05/26/2020   Procedure: PHOTOCOAGULATION;  Surgeon: Bernarda Caffey, MD;  Location: [facility];  Service: Ophthalmology;  Laterality: Right;   PROSTATE BIOPSY  2015   RETINAL DETACHMENT SURGERY Right 05/06/2020   Pneumatic retinopexy for rheg RD repair - Dr. Bernarda Caffey   RETINAL DETACHMENT SURGERY Right 05/26/2020   PPV/MP - Dr. Bernarda Caffey   Family History  Problem Relation Age of Onset   Hypertension Mother    Deep vein thrombosis Father    Diabetes Brother    Tuberculosis Maternal Grandfather    Emphysema Maternal Grandfather    Lung disease Brother    Asthma Son    Rectal cancer Neg Hx    Colon cancer Neg Hx    Social History   Socioeconomic History   Marital status: Single    Spouse name: Not on file   Number of children: 2   Years of education: 12   Highest education level: Not on file  Occupational History   Occupation: Electrical engineer   Occupation: Unemployed    Comment: disability  Tobacco Use   Smoking status:  Former    Packs/day: 1.50    Years: 46.00    Total pack years: 69.00    Types: Cigarettes    Quit date: 12/12/2010    Years since quitting: 11.7   Smokeless tobacco: Never  Vaping Use   Vaping Use: Never used  Substance and Sexual Activity   Alcohol use: Yes    Alcohol/week: 4.0 standard drinks of alcohol    Types: 4 Cans of beer per week    Comment: a couple of beer on a weekend   Drug use: Not Currently    Types: Cocaine, Marijuana, "Crack" cocaine    Comment: over 25 years ago   Sexual activity: Not on file  Other  Topics Concern   Not on file  Social History Narrative   Raised in [place], [place]. Does not have any religious beliefs that would effect healthcare. Lives in house with sister. Likes to ride motorcycle for fun.    Moving out on his own; in the next couple of weeks will be moving back home that he had rented      [place]; with a helmet   Social Determinants of Health   Financial Resource Strain: Low Risk  (09/17/2022)   Overall Financial Resource Strain (CARDIA)    Difficulty of Paying Living Expenses: Not hard at all  Food Insecurity: No Food Insecurity (09/17/2022)   Hunger Vital Sign    Worried About Running Out of Food in the Last Year: Never true    [place] in the Last Year: Never true  Transportation Needs: No Transportation Needs (09/17/2022)   PRAPARE - Hydrologist (Medical): No    Lack of Transportation (Non-Medical): No  Physical Activity: Insufficiently Active (09/17/2022)   Exercise Vital Sign    Days of Exercise per Week: 3 days    Minutes of Exercise per Session: 30 min  Stress: Stress Concern Present (09/17/2022)   [place]    Feeling of Stress : To some extent  Social Connections: Socially Isolated (09/17/2022)   Social Connection and Isolation Panel [NHANES]    Frequency of Communication with Friends and Family: More than three times a week     Frequency of Social Gatherings with Friends and Family: More than three times a week    Attends Religious Services: Never    Marine scientist or Organizations: No    Attends Music therapist: Never    Marital Status: Never married    Tobacco Counseling Counseling given: Not Answered   Clinical Intake:  Pre-visit preparation completed: Yes  Pain : No/denies pain     BMI - recorded: 31.93 Nutritional Status: BMI > 30  Obese Nutritional Risks: None Diabetes: No  How often do you need to have someone help you when you read instructions, pamphlets, or other written materials from your doctor or pharmacy?: 1 - Never  Diabetic?no  Interpreter Needed?: No  Information entered by :: Charlott Rakes, LPN   Activities of Daily Living    09/17/2022    8:10 AM  In your present state of health, do you have any difficulty performing the following activities:  Hearing? 0  Vision? 0  Difficulty concentrating or making decisions? 0  Walking or climbing stairs? 0  Dressing or bathing? 0  Doing errands, shopping? 0  Preparing Food and eating ? N  Using the Toilet? N  In the past six months, have you accidently leaked urine? N  Do you have problems with loss of bowel control? N  Managing your Medications? N  Managing your Finances? N  Housekeeping or managing your Housekeeping? N    Patient Care Team: Vivi Barrack, MD as PCP - General (Family Medicine) Burnell Blanks, MD as PCP - Cardiology (Cardiology) Burnell Blanks, MD as Consulting Physician (Cardiology) Elam Dutch, MD (Inactive) as Consulting Physician (Vascular Surgery) Deneise Lever, MD as Consulting Physician (Pulmonary Disease) Franchot Gallo, MD as Consulting Physician (Urology)  Indicate any recent Medical Services you may have received from other than [facility] providers in the past year (date may be approximate).     Assessment:   This is a  routine wellness  examination for [place].  Hearing/Vision screen Hearing Screening - Comments:: Pt denies any hearing issues  Vision Screening - Comments:: Pt follows up with Dr Coralyn Pear for annual eye exam   Dietary issues and exercise activities discussed: Current Exercise Habits: Home exercise routine, Type of exercise: Other - see comments, Time (Minutes): 30, Frequency (Times/Week): 3, Weekly Exercise (Minutes/Week): 90   Goals Addressed             This Visit's Progress    Patient Stated       Like to get to 200 lbs        Depression Screen    09/17/2022    8:06 AM 08/27/2022   10:04 AM 04/23/2022    7:28 AM 09/04/2021    8:08 AM 02/20/2021   10:08 AM 07/25/2020    8:22 AM 06/03/2019    8:21 AM  PHQ 2/9 Scores  PHQ - 2 Score 1 0 '2 2 2 2 '$ 0  PHQ- 9 Score '2 2 9 4 4 6     '$ Fall Risk    09/17/2022    8:10 AM 09/04/2021    8:13 AM 07/25/2020    8:28 AM 06/03/2019    8:21 AM 03/13/2019    7:24 AM  Fall Risk   Falls in the past year? 0 0 0 0 0  Number falls in past yr: 0 0 0  0  Injury with Fall? 0 0 0 0 0  Risk for fall due to : Impaired vision Impaired vision;Impaired balance/gait Impaired vision;Impaired balance/gait    Risk for fall due to: Comment  at times with balance     Follow up Falls prevention discussed Falls prevention discussed Falls prevention discussed Education provided;Falls prevention discussed;Falls evaluation completed     FALL RISK PREVENTION PERTAINING TO THE HOME:  Any stairs in or around the home? No  If so, are there any without handrails? No  Home free of loose throw rugs in walkways, pet beds, electrical cords, etc? Yes  Adequate lighting in your home to reduce risk of falls? Yes   ASSISTIVE DEVICES UTILIZED TO PREVENT FALLS:  Life alert? No  Use of a cane, walker or w/c? No  Grab bars in the bathroom? No  Shower chair or bench in shower? No  Elevated toilet seat or a handicapped toilet? No   TIMED UP AND GO:  Was the test performed? Yes .  Length of  time to ambulate 10 feet: 10 sec.   Gait steady and fast without use of assistive device  Cognitive Function:    12/13/2014    8:41 AM  MMSE - Mini Mental State Exam  Not completed: Unable to complete        09/17/2022    8:11 AM 09/04/2021    8:15 AM 07/25/2020    8:30 AM  6CIT Screen  What Year? 0 points 0 points 0 points  What month? 0 points 0 points 0 points  What time? 0 points 0 points   Count back from 20 0 points 0 points 0 points  Months in reverse 4 points 4 points 4 points  Repeat phrase 0 points 0 points   Total Score 4 points 4 points     Immunizations Immunization History  Administered Date(s) Administered   Influenza Split 05/14/2015   Influenza, High Dose Seasonal PF 04/26/2019   Influenza,inj,Quad PF,6+ Mos 08/02/2016, 06/16/2017, 06/12/2018, 04/26/2019   Influenza-Unspecified 06/16/2017, 05/21/2020, 06/06/2021   Meningococcal Conjugate 04/16/2022   PFIZER(Purple  Top)SARS-COV-2 Vaccination 10/22/2019, 11/11/2019, 05/12/2020, 06/06/2021   Pneumococcal Polysaccharide-23 08/04/2018   Pneumococcal-Unspecified 04/16/2022   Rsv, Bivalent, Protein Subunit Rsvpref,pf Evans Lance) 04/16/2022   Tdap 12/13/2014   Zoster Recombinat (Shingrix) 05/21/2020, 08/21/2020    TDAP status: Up to date  Flu Vaccine status: Due, Education has been provided regarding the importance of this vaccine. Advised may receive this vaccine at local pharmacy or Health Dept. Aware to provide a copy of the vaccination record if obtained from local pharmacy or Health Dept. Verbalized acceptance and understanding.  Pneumococcal vaccine status: Up to date  Covid-19 vaccine status: Completed vaccines  Qualifies for Shingles Vaccine? Yes   Zostavax completed Yes   Shingrix Completed?: Yes  Screening Tests Health Maintenance  Topic Date Due   COVID-19 Vaccine (5 - 2023-24 season) 04/13/2022   Lung Cancer Screening  10/09/2022   INFLUENZA VACCINE  11/11/2022 (Originally 03/13/2022)    Pneumonia Vaccine 20+ Years old (2 - PCV) 04/17/2023   Medicare Annual Wellness (AWV)  09/18/2023   COLONOSCOPY (Pts 45-82yr Insurance coverage will need to be confirmed)  10/07/2023   DTaP/Tdap/Td (2 - Td or Tdap) 12/12/2024   Hepatitis C Screening  Completed   Zoster Vaccines- Shingrix  Completed   HPV VACCINES  Aged Out    Health Maintenance  Health Maintenance Due  Topic Date Due   COVID-19 Vaccine (5 - 2023-24 season) 04/13/2022   Lung Cancer Screening  10/09/2022    Colorectal cancer screening: Type of screening: Colonoscopy. Completed 10/06/18. Repeat every 5 years  Lung Cancer Screening: (Low Dose CT Chest recommended if Age 68-80years, 30 pack-year currently smoking OR have quit w/in 15years.) does qualify.   Lung Cancer Screening Referral: scheduled 10/15/22  Additional Screening:  Hepatitis C Screening:  Completed 08/02/16  Vision Screening: Recommended annual ophthalmology exams for early detection of glaucoma and other disorders of the eye. Is the patient up to date with their annual eye exam?  Yes  Who is the provider or what is the name of the office in which the patient attends annual eye exams? Dr ZCoralyn Pear If pt is not established with a provider, would they like to be referred to a provider to establish care? No .   Dental Screening: Recommended annual dental exams for proper oral hygiene  Community Resource Referral / Chronic Care Management: CRR required this visit?  No   CCM required this visit?  No      Plan:     I have personally reviewed and noted the following in the patient's chart:   Medical and social history Use of alcohol, tobacco or illicit drugs  Current medications and supplements including opioid prescriptions. Patient is not currently taking opioid prescriptions. Functional ability and status Nutritional status Physical activity Advanced directives List of other physicians Hospitalizations, surgeries, and ER visits in previous 12  months Vitals Screenings to include cognitive, depression, and falls Referrals and appointments  In addition, I have reviewed and discussed with patient certain preventive protocols, quality metrics, and best practice recommendations. A written personalized care plan for preventive services as well as general preventive health recommendations were provided to patient.     TWillette Brace LPN   22/04/5620  Nurse Notes: none

## 2022-09-27 ENCOUNTER — Ambulatory Visit: Payer: PPO | Attending: Cardiovascular Disease | Admitting: Cardiovascular Disease

## 2022-09-27 ENCOUNTER — Encounter: Payer: Self-pay | Admitting: Cardiovascular Disease

## 2022-09-27 VITALS — BP 140/90 | HR 76 | Ht 73.0 in | Wt 229.4 lb

## 2022-09-27 DIAGNOSIS — I739 Peripheral vascular disease, unspecified: Secondary | ICD-10-CM | POA: Diagnosis not present

## 2022-09-27 DIAGNOSIS — E782 Mixed hyperlipidemia: Secondary | ICD-10-CM | POA: Diagnosis not present

## 2022-09-27 DIAGNOSIS — I2510 Atherosclerotic heart disease of native coronary artery without angina pectoris: Secondary | ICD-10-CM

## 2022-09-27 DIAGNOSIS — I10 Essential (primary) hypertension: Secondary | ICD-10-CM | POA: Diagnosis not present

## 2022-09-27 NOTE — Patient Instructions (Signed)
Medication Instructions:  No changes *If you need a refill on your cardiac medications before your next appointment, please call your pharmacy*   Lab Work: none If you have labs (blood work) drawn today and your tests are completely normal, you will receive your results only by: MyChart Message (if you have MyChart) OR A paper copy in the mail If you have any lab test that is abnormal or we need to change your treatment, we will call you to review the results.   Testing/Procedures: none   Follow-Up: At [facility], you and your health needs are our priority.  As part of our continuing mission to provide you with exceptional heart care, we have created designated Provider Care Teams.  These Care Teams include your primary Cardiologist (physician) and Advanced Practice Providers (APPs -  Physician Assistants and Nurse Practitioners) who all work together to provide you with the care you need, when you need it.   Your next appointment:   12 month(s)  Provider:   Christopher McAlhany, MD      

## 2022-09-27 NOTE — Progress Notes (Signed)
Chief Complaint  Patient presents with   Follow-up    CAD   History of Present Illness: 67 yo male with history of HTN, hyperlipidemia, PAD, tobacco abuse and depression here today for cardiac follow up. I saw him last in 2019. I had followed him for PAD in the past. Cardiac cath in May 2012 with mild disease in the RCA and no disease in the LAD or Circumflex. Lower extremity angiogram May 2012 with 80% proximal superficial femoral artery stenosis. The left superficial femoral artery was occluded at the ostium. A stent was placed in the right SFA in September 2012. He was seen in the [facility] office by Dr. Oneida Alar in 2013. He has undergone stenting of the right SFA in May 2022. Flush occlusion of left SFA with reconstitution at the knee. He was admitted to [facility] September 2019 with dyspnea and chest pain. Troponin negative. Echo 04/26/18 with normal LV systolic function, [id number]. There were no wall motion abnormalities. No significant valve disease. Nuclear stress test 05/12/18 with no ischemia. He was seen in August 2022 with chest pain. Cardiac cath August 2022 with mild to moderate RCA disease (40% lesions in the proximal and mid RCA). Carotid artery dopplers January 2024 with no disease.   He is here today for follow up. The patient denies any chest pain, dyspnea, palpitations, lower extremity edema, orthopnea, PND, dizziness, near syncope or syncope.   Primary Care Physician: Vivi Barrack, MD  Past Medical History:  Diagnosis Date   Bell's palsy    Cataract    Mixed form OU   Closed nondisplaced fracture of neck of third metacarpal bone of right hand 02/04/2017   Clotting disorder ([place])    Depression    Diverticulitis    GERD (gastroesophageal reflux disease)    Headache(784.0)    HTN (hypertension)    Hyperlipidemia    Hypertensive retinopathy    OU   Mild CAD 2012   PVD (peripheral vascular disease) ([place])    Retinal detachment    Sexual dysfunction    Sleep apnea     Past  Surgical History:  Procedure Laterality Date   ABDOMINAL AORTOGRAM W/LOWER EXTREMITY Bilateral 02/05/2020   Procedure: ABDOMINAL AORTOGRAM W/LOWER EXTREMITY;  Surgeon: Elam Dutch, MD;  Location: [place] [facility];  Service: Cardiovascular;  Laterality: Bilateral;   ABDOMINAL AORTOGRAM W/LOWER EXTREMITY N/A 12/16/2020   Procedure: ABDOMINAL AORTOGRAM W/LOWER EXTREMITY;  Surgeon: Elam Dutch, MD;  Location: [place] [facility];  Service: Cardiovascular;  Laterality: N/A;   CATARACT EXTRACTION     EYE SURGERY Right 05/06/2020   Pneumatic retinopexy for rheg. RD repair - Dr. Bernarda Caffey   EYE SURGERY Right 05/26/2020   PPV/MP - Dr. Bernarda Caffey   GAS INSERTION Right 05/26/2020   Procedure: INSERTION OF GAS;  Surgeon: Bernarda Caffey, MD;  Location: [place];  Service: Ophthalmology;  Laterality: Right;   INGUINAL HERNIA REPAIR Right 05/29/2021   Procedure: RIGHT INGUINAL HERNIA REPAIR WITH MESH;  Surgeon: Jovita Kussmaul, MD;  Location: [place];  Service: General;  Laterality: Right;  TAP BLOCK   LEFT HEART CATH AND CORONARY ANGIOGRAPHY N/A 04/05/2021   Procedure: LEFT HEART CATH AND CORONARY ANGIOGRAPHY;  Surgeon: Burnell Blanks, MD;  Location: [place] [facility];  Service: Cardiovascular;  Laterality: N/A;   MEMBRANE PEEL Right 05/26/2020   Procedure: MEMBRANE PEEL;  Surgeon: Bernarda Caffey, MD;  Location: [place];  Service: Ophthalmology;  Laterality: Right;   PARS PLANA  VITRECTOMY Right 05/26/2020   Procedure: PARS PLANA VITRECTOMY WITH 25 GAUGE;  Surgeon: Bernarda Caffey, MD;  Location: [place];  Service: Ophthalmology;  Laterality: Right;   PERIPHERAL VASCULAR INTERVENTION Right 12/16/2020   Procedure: PERIPHERAL VASCULAR INTERVENTION;  Surgeon: Elam Dutch, MD;  Location: [place] [facility];  Service: Cardiovascular;  Laterality: Right;  SFA (distal)   PHOTOCOAGULATION Right 05/26/2020   Procedure: PHOTOCOAGULATION;  Surgeon: Bernarda Caffey, MD;  Location:  [place];  Service: Ophthalmology;  Laterality: Right;   PROSTATE BIOPSY  2015   RETINAL DETACHMENT SURGERY Right 05/06/2020   Pneumatic retinopexy for rheg RD repair - Dr. Bernarda Caffey   RETINAL DETACHMENT SURGERY Right 05/26/2020   PPV/MP - Dr. Bernarda Caffey    Current Outpatient Medications  Medication Sig Dispense Refill   ALPRAZolam (XANAX) 0.25 MG tablet Take 1 tablet (0.25 mg total) by mouth 2 (two) times daily as needed for anxiety. 20 tablet 0   aspirin EC 81 MG tablet Take 1 tablet (81 mg total) by mouth daily. 150 tablet 2   atorvastatin (LIPITOR) 40 MG tablet Take 1 tablet (40 mg total) by mouth daily. 90 tablet 3   clopidogrel (PLAVIX) 75 MG tablet Take 1 tablet (75 mg total) by mouth daily. 90 tablet 3   diclofenac Sodium (VOLTAREN) 1 % GEL Apply 1 application topically 3 (three) times daily as needed (leg cramps/spasms).     famotidine (PEPCID) 20 MG tablet Take 1 tablet (20 mg total) by mouth daily. 90 tablet 3   FLUoxetine (PROZAC) 40 MG capsule TAKE (1) CAPSULE BY MOUTH DAILY 90 capsule 3   gabapentin (NEURONTIN) 100 MG capsule Take 1-3 capsules (100-300 mg total) by mouth at bedtime as needed. 90 capsule 1   irbesartan (AVAPRO) 300 MG tablet Take 1 tablet (300 mg total) by mouth daily. 90 tablet 3   nitroGLYCERIN (NITROSTAT) 0.4 MG SL tablet Place 0.4 mg under the tongue every 5 (five) minutes as needed for chest pain.     tamsulosin (FLOMAX) 0.4 MG CAPS capsule Take 1 capsule (0.4 mg total) by mouth daily. 90 capsule 3   No current facility-administered medications for this visit.    No Known Allergies  Social History   Socioeconomic History   Marital status: Single    Spouse name: Not on file   Number of children: 2   Years of education: 12   Highest education level: Not on file  Occupational History   Occupation: Electrical engineer   Occupation: Unemployed    Comment: disability  Tobacco Use   Smoking status: Former    Packs/day: 1.50    Years: 46.00     Total pack years: 69.00    Types: Cigarettes    Quit date: 12/12/2010    Years since quitting: 11.8   Smokeless tobacco: Never  Vaping Use   Vaping Use: Never used  Substance and Sexual Activity   Alcohol use: Yes    Alcohol/week: 4.0 standard drinks of alcohol    Types: 4 Cans of beer per week    Comment: a couple of beer on a weekend   Drug use: Not Currently    Types: Cocaine, Marijuana, "Crack" cocaine    Comment: over 25 years ago   Sexual activity: Not on file  Other Topics Concern   Not on file  Social History Narrative   Raised in [place], [place]. Does not have any religious beliefs that would effect healthcare. Lives in house with sister. Likes to ride motorcycle for  fun.    Moving out on his own; in the next couple of weeks will be moving back home that he had rented      [employer]; with a helmet   Social Determinants of Health   Financial Resource Strain: Low Risk  (09/17/2022)   Overall Financial Resource Strain (CARDIA)    Difficulty of Paying Living Expenses: Not hard at all  Food Insecurity: No Food Insecurity (09/17/2022)   Hunger Vital Sign    Worried About Running Out of Food in the Last Year: Never true    [place] in the Last Year: Never true  Transportation Needs: No Transportation Needs (09/17/2022)   PRAPARE - Hydrologist (Medical): No    Lack of Transportation (Non-Medical): No  Physical Activity: Insufficiently Active (09/17/2022)   Exercise Vital Sign    Days of Exercise per Week: 3 days    Minutes of Exercise per Session: 30 min  Stress: Stress Concern Present (09/17/2022)   [place]    Feeling of Stress : To some extent  Social Connections: Socially Isolated (09/17/2022)   Social Connection and Isolation Panel [NHANES]    Frequency of Communication with Friends and Family: More than three times a week    Frequency of Social Gatherings with Friends and  Family: More than three times a week    Attends Religious Services: Never    Marine scientist or Organizations: No    Attends Archivist Meetings: Never    Marital Status: Never married  Intimate Partner Violence: Not At Risk (09/17/2022)   Humiliation, Afraid, Rape, and Kick questionnaire    Fear of Current or Ex-Partner: No    Emotionally Abused: No    Physically Abused: No    Sexually Abused: No    Family History  Problem Relation Age of Onset   Hypertension Mother    Deep vein thrombosis Father    Diabetes Brother    Tuberculosis Maternal Grandfather    Emphysema Maternal Grandfather    Lung disease Brother    Asthma Son    Rectal cancer Neg Hx    Colon cancer Neg Hx     Review of Systems:  As stated in the HPI and otherwise negative.   BP (!) 140/90   Pulse 76   Ht 6' 1"$  (1.854 m)   Wt 104.1 kg   SpO2 94%   BMI 30.27 kg/m   Physical Examination: General: Well developed, well nourished, NAD  HEENT: OP clear, mucus membranes moist  SKIN: warm, dry. No rashes. Neuro: No focal deficits  Musculoskeletal: Muscle strength 5/5 all ext  Psychiatric: Mood and affect normal  Neck: No JVD, no carotid bruits, no thyromegaly, no lymphadenopathy.  Lungs:Clear bilaterally, no wheezes, rhonci, crackles Cardiovascular: Regular rate and rhythm. No murmurs, gallops or rubs. Abdomen:Soft. Bowel sounds present. Non-tender.  Extremities: No lower extremity edema. Pulses are 2 + in the bilateral DP/PT.  EKG:  EKG is ordered today. The ekg ordered today demonstrates Sinus, PVCs  Echo 04/26/18: Left ventricle: The cavity size was normal. Systolic function was   normal. The estimated ejection fraction was in the range of 55%   to 60%. Wall motion was normal; there were no regional wall   motion abnormalities. - Aortic valve: Transvalvular velocity was within the normal range.   There was no stenosis. There was no significant regurgitation. - Mitral valve: There was  trivial regurgitation. -  Left atrium: The atrium was mildly dilated. - Right ventricle: Systolic function was normal. - Right atrium: The atrium was mildly dilated. - Atrial septum: No defect or patent foramen ovale was identified. - Tricuspid valve: There was trivial regurgitation. - Pulmonic valve: There was no significant regurgitation. - Inferior vena cava: The vessel was normal in size. The   respirophasic diameter changes were in the normal range (>= 50%),   consistent with normal central venous pressure.  Recent Labs: 04/23/2022: ALT 26; BUN 20; Creatinine, Ser 0.74; Hemoglobin 14.3; Platelets 241.0; Potassium 4.7; Sodium 139; TSH 2.65   Lipid Panel    Component Value Date/Time   CHOL 122 04/23/2022 0830   CHOL 137 06/11/2014 0000   TRIG 59.0 04/23/2022 0830   TRIG 173 06/11/2014 0000   HDL 50.30 04/23/2022 0830   CHOLHDL 2 04/23/2022 0830   VLDL 11.8 04/23/2022 0830   VLDL 35 06/11/2014 0000   LDLCALC 60 04/23/2022 0830     Wt Readings from Last 3 Encounters:  09/27/22 104.1 kg  09/17/22 106.8 kg  09/03/22 108 kg    Assessment and Plan:   1. CAD with angina: Mild CAD by cath in August 2022. Continue ASA, Plavix and statin  2. PAD: Followed in [facility]. No carotid disease noted on dopplers January 2024.   3. Former Tobacco abuse: He stopped smoking in 2012.   4. Hyperlipidemia: LDL 60 in September 2023. Continue statin  5. HTN: BP is well controlled. No changes  Labs/ tests ordered today include:   Orders Placed This Encounter  Procedures   EKG 12-Lead   Disposition:   F/U with me  in 12 months  Signed, [name], MD 09/27/2022 3:39 PM    [facility], [place], [place]  [address] Phone: [phone]; Fax: [phone]

## 2022-10-01 DIAGNOSIS — M62512 Muscle wasting and atrophy, not elsewhere classified, left shoulder: Secondary | ICD-10-CM | POA: Diagnosis not present

## 2022-10-01 DIAGNOSIS — R293 Abnormal posture: Secondary | ICD-10-CM | POA: Diagnosis not present

## 2022-10-01 DIAGNOSIS — M25612 Stiffness of left shoulder, not elsewhere classified: Secondary | ICD-10-CM | POA: Diagnosis not present

## 2022-10-01 DIAGNOSIS — M5412 Radiculopathy, cervical region: Secondary | ICD-10-CM | POA: Diagnosis not present

## 2022-10-07 NOTE — Progress Notes (Deleted)
    Todd Weeks is a 67 y.o. male who presents to [place] at [facility] today for left shoulder/arm pain with radicular sx at nighttime, weakness, and pain when reaching up/back. Pt was last seen by Dr. Georgina Snell on 09/03/22 for this concern. Pt was prescribed Gabapentin 100-300 mg at bedtime prn, had diagnostic imaging of the shoulder and neck, and received subacromial bursa injection of the left shoulder. Today patient reports ***  Diagnostic Imaging:       09/03/22 - XR C-Spine                   - XR Left Shoulder                   - Diagnostic U/S Left Shoulder  Pertinent review of systems: ***  Relevant historical information: ***   Exam:  There were no vitals taken for this visit. General: Well Developed, well nourished, and in no acute distress.   MSK: ***    Lab and Radiology Results No results found for this or any previous visit (from the past 72 hour(s)). No results found.     Assessment and Plan: 67 y.o. male with ***   PDMP not reviewed this encounter. No orders of the defined types were placed in this encounter.  No orders of the defined types were placed in this encounter.    Discussed warning signs or symptoms. Please see discharge instructions. Patient expresses understanding.   ***

## 2022-10-08 ENCOUNTER — Ambulatory Visit: Payer: PPO | Admitting: Family Medicine

## 2022-10-08 DIAGNOSIS — M25512 Pain in left shoulder: Secondary | ICD-10-CM

## 2022-10-08 DIAGNOSIS — R293 Abnormal posture: Secondary | ICD-10-CM | POA: Diagnosis not present

## 2022-10-08 DIAGNOSIS — M5412 Radiculopathy, cervical region: Secondary | ICD-10-CM

## 2022-10-08 DIAGNOSIS — M79602 Pain in left arm: Secondary | ICD-10-CM

## 2022-10-08 DIAGNOSIS — M25612 Stiffness of left shoulder, not elsewhere classified: Secondary | ICD-10-CM | POA: Diagnosis not present

## 2022-10-08 DIAGNOSIS — M62512 Muscle wasting and atrophy, not elsewhere classified, left shoulder: Secondary | ICD-10-CM | POA: Diagnosis not present

## 2022-10-09 ENCOUNTER — Other Ambulatory Visit: Payer: PPO

## 2022-10-15 ENCOUNTER — Ambulatory Visit: Payer: PPO | Admitting: Family Medicine

## 2022-10-15 ENCOUNTER — Inpatient Hospital Stay: Admission: RE | Admit: 2022-10-15 | Payer: PPO | Source: Ambulatory Visit

## 2022-10-17 DIAGNOSIS — R293 Abnormal posture: Secondary | ICD-10-CM | POA: Diagnosis not present

## 2022-10-17 DIAGNOSIS — M62512 Muscle wasting and atrophy, not elsewhere classified, left shoulder: Secondary | ICD-10-CM | POA: Diagnosis not present

## 2022-10-17 DIAGNOSIS — M5412 Radiculopathy, cervical region: Secondary | ICD-10-CM | POA: Diagnosis not present

## 2022-10-17 DIAGNOSIS — M25612 Stiffness of left shoulder, not elsewhere classified: Secondary | ICD-10-CM | POA: Diagnosis not present

## 2022-10-22 DIAGNOSIS — M25612 Stiffness of left shoulder, not elsewhere classified: Secondary | ICD-10-CM | POA: Diagnosis not present

## 2022-10-22 DIAGNOSIS — M62512 Muscle wasting and atrophy, not elsewhere classified, left shoulder: Secondary | ICD-10-CM | POA: Diagnosis not present

## 2022-10-22 DIAGNOSIS — M5412 Radiculopathy, cervical region: Secondary | ICD-10-CM | POA: Diagnosis not present

## 2022-10-22 DIAGNOSIS — R293 Abnormal posture: Secondary | ICD-10-CM | POA: Diagnosis not present

## 2022-11-01 DIAGNOSIS — G4733 Obstructive sleep apnea (adult) (pediatric): Secondary | ICD-10-CM | POA: Insufficient documentation

## 2022-11-01 DIAGNOSIS — Z6831 Body mass index (BMI) 31.0-31.9, adult: Secondary | ICD-10-CM | POA: Diagnosis not present

## 2022-12-06 ENCOUNTER — Other Ambulatory Visit: Payer: Self-pay | Admitting: Otolaryngology

## 2022-12-28 ENCOUNTER — Emergency Department (HOSPITAL_COMMUNITY): Payer: PPO

## 2022-12-28 ENCOUNTER — Other Ambulatory Visit: Payer: Self-pay

## 2022-12-28 ENCOUNTER — Emergency Department (HOSPITAL_COMMUNITY)
Admission: EM | Admit: 2022-12-28 | Discharge: 2022-12-28 | Disposition: A | Discharge status: Home / Self Care | Payer: PPO | Attending: Emergency Medicine | Admitting: Emergency Medicine

## 2022-12-28 DIAGNOSIS — Z7982 Long term (current) use of aspirin: Secondary | ICD-10-CM | POA: Diagnosis not present

## 2022-12-28 DIAGNOSIS — M25552 Pain in left hip: Secondary | ICD-10-CM | POA: Insufficient documentation

## 2022-12-28 DIAGNOSIS — W19XXXA Unspecified fall, initial encounter: Secondary | ICD-10-CM

## 2022-12-28 DIAGNOSIS — W11XXXA Fall on and from ladder, initial encounter: Secondary | ICD-10-CM | POA: Diagnosis not present

## 2022-12-28 NOTE — Discharge Instructions (Addendum)
It was a pleasure caring for you today.Hip xray was without concern for fractures or dislocations. I recommend using your topical diclofenac gel to help relieve pain at home.  Seek emergency care if experiencing new or worsening symptoms.

## 2022-12-28 NOTE — ED Provider Notes (Signed)
[place] EMERGENCY DEPARTMENT AT [facility] Provider Note   CSN: [id number] Arrival date & time: 12/28/22  1523     History  Chief Complaint  Patient presents with   Todd Weeks is a 67 y.o. male who presents to ED after a 41ft mechanical fall off a ladder earlier today. Patient endorsing left hip pain that hurts to ambulate on. Denies head trauma, back pain, or any other joint pain.  Denies chest pain, SOB, abdominal pain, headache, vision changes, urinary retention.   Fall       Home Medications Prior to Admission medications   Medication Sig Start Date End Date Taking? Authorizing Provider  ALPRAZolam (XANAX) 0.25 MG tablet Take 1 tablet (0.25 mg total) by mouth 2 (two) times daily as needed for anxiety. 02/20/21   Ardith Dark, MD  aspirin EC 81 MG tablet Take 1 tablet (81 mg total) by mouth daily. 12/16/20   Sherren Kerns, MD  atorvastatin (LIPITOR) 40 MG tablet Take 1 tablet (40 mg total) by mouth daily. 04/23/22   Ardith Dark, MD  clopidogrel (PLAVIX) 75 MG tablet Take 1 tablet (75 mg total) by mouth daily. 04/23/22   Ardith Dark, MD  diclofenac Sodium (VOLTAREN) 1 % GEL Apply 1 application topically 3 (three) times daily as needed (leg cramps/spasms).    [provider]  famotidine (PEPCID) 20 MG tablet Take 1 tablet (20 mg total) by mouth daily. 04/23/22   Ardith Dark, MD  FLUoxetine (PROZAC) 40 MG capsule TAKE (1) CAPSULE BY MOUTH DAILY 04/23/22   Ardith Dark, MD  gabapentin (NEURONTIN) 100 MG capsule Take 1-3 capsules (100-300 mg total) by mouth at bedtime as needed. 09/03/22   Rodolph Bong, MD  irbesartan (AVAPRO) 300 MG tablet Take 1 tablet (300 mg total) by mouth daily. 04/23/22   Ardith Dark, MD  nitroGLYCERIN (NITROSTAT) 0.4 MG SL tablet Place 0.4 mg under the tongue every 5 (five) minutes as needed for chest pain. 04/23/21   [provider]  tamsulosin (FLOMAX) 0.4 MG CAPS capsule Take 1 capsule (0.4 mg  total) by mouth daily. 04/23/22   Ardith Dark, MD      Allergies    Patient has no known allergies.    Review of Systems   Review of Systems  Musculoskeletal:  Positive for arthralgias.    Physical Exam Updated Vital Signs BP (!) 155/80 (BP Location: Right Arm)   Pulse 81   Temp 98.3 F (36.8 C)   Resp 16   Ht 6' (1.829 m)   Wt 104.3 kg   SpO2 97%   BMI 31.19 kg/m  Physical Exam Vitals and nursing note reviewed.  Constitutional:      General: He is not in acute distress.    Appearance: He is not ill-appearing or toxic-appearing.  HENT:     Head: Normocephalic and atraumatic.     Right Ear: Tympanic membrane, ear canal and external ear normal.     Left Ear: Tympanic membrane, ear canal and external ear normal.  Eyes:     General: No scleral icterus.       Right eye: No discharge.        Left eye: No discharge.     Conjunctiva/sclera: Conjunctivae normal.  Cardiovascular:     Rate and Rhythm: Normal rate.  Pulmonary:     Effort: Pulmonary effort is normal.  Abdominal:     General: Abdomen is flat.  Skin:    General: Skin is warm and dry.  Neurological:     General: No focal deficit present.     Mental Status: He is alert and oriented to person, place, and time. Mental status is at baseline.     Sensory: No sensory deficit.     Motor: No weakness.     Comments: GCS 15. Speech is goal oriented. No deficits appreciated to CN III-XII. Patient without extremity weakness. Left hip ROM mildly restricted due to pain, and not tender to palpation. Sensation to light touch intact. 2+ pedal pulse - left leg neurovascularly intact and non-tense. Normal ROM and no tenderness of neck, shoulders, elbows, knees and ankles bilaterally.   Psychiatric:        Mood and Affect: Mood normal.        Behavior: Behavior normal.     ED Results / Procedures / Treatments   Labs (all labs ordered are listed, but only abnormal results are displayed) Labs Reviewed - No data to  display  EKG None  Radiology DG Hip Unilat With Pelvis 2-3 Views Left  Result Date: 12/28/2022 CLINICAL DATA:  Status post fall. EXAM: DG HIP (WITH OR WITHOUT PELVIS) 2-3V LEFT COMPARISON:  None Available. FINDINGS: There is no evidence of hip fracture or dislocation. Moderate severity degenerative changes are seen in the form of joint space narrowing and acetabular sclerosis. A radiopaque vascular stent is seen overlying the medial aspect of the proximal right lower extremity. Moderate severity soft tissue swelling is seen along the lateral aspect of the left hip. IMPRESSION: 1. Lateral soft tissue swelling without evidence of acute fracture or dislocation. 2. Moderate severity degenerative changes. Electronically Signed   By: Aram Candela M.D.   On: 12/28/2022 16:33    Procedures Procedures    Medications Ordered in ED Medications - No data to display  ED Course/ Medical Decision Making/ A&P                             Medical Decision Making Amount and/or Complexity of Data Reviewed Radiology: ordered.   This patient presents to the ED for concern of left hip pain, this involves an extensive number of treatment options, and is a complaint that carries with it a high risk of complications and morbidity.  The differential diagnosis includes hemarthrosis, gout, septic joint, fracture, compartment syndrome   Co morbidities that complicate the patient evaluation  none   Imaging Studies ordered:  I ordered imaging studies including Xray -no concern for fracture or dislocation I independently visualized and interpreted imaging Shared findings with patient I agree with the radiologist interpretation   Problem List / ED Course / Critical interventions / Medication management  Patient presents to ED following 44ft mechanical fall onto left hip from ladder. No head trauma or LOC. Patient able to move left hip, but it is painful. Patient able to ambulate with some limping.  Xray without concern for fracture or dislocation. Shared results with patient. Offered patient pain management - patient states that he is fine using his home diclofenac gel. Did not order head imaging at this time since patient is without symptoms and did not have head trauma or signs of head trauma on physical exam. Did not order blood labs on patient since his fall was mechanical and patient denied any other symptom at this time. Educated patient of importance of hip exercised so that he does not loose hip mobility. Educated  patient on RICE treatment for injuries. Patient's questions answered. I have reviewed the patients home medicines and have made adjustments as needed Patient afebrile and with stable vitals. Patient states that he is relieved that he doesn't have a hip fracture and is ready to go home. Provided patient with strict return precautions.   Ddx these are considered less likely due to history of present illness and physical exam -hemarthrosis: joint without swelling; ROM intact -gout: no warmth or erythema; ROM intact  -septic joint: afebrile; no warmth or erythema; no skin changes; ROM intact  -fracture: xray without concern  -compartment syndrome: area not tense; neurovascularly intact   Social Determinants of Health:  none         Final Clinical Impression(s) / ED Diagnoses Final diagnoses:  Fall, initial encounter  Pain of left hip    Rx / DC Orders ED Discharge Orders     None         Dorthy Cooler, [place] 12/28/22 1742    Linwood Dibbles, MD 12/29/22 1514

## 2022-12-28 NOTE — ED Triage Notes (Signed)
Pt arrives POV after falling from a ladder earlier today. Pt states that he was 32ft up. Denies neck/back pain. Pt is on ASA and Plavix but did not hit head. Pt states he landed on the right side and has left sided hip pain. Denies LOC. AOx4

## 2023-01-01 ENCOUNTER — Telehealth: Payer: Self-pay

## 2023-01-01 NOTE — Telephone Encounter (Signed)
Transition Care Management Follow-up Telephone Call Date of discharge and from where: 12/28/2022 [facility] How have you been since you were released from the hospital? Patient is still very sore and is having trouble sleeping  Any questions or concerns? No  Items Reviewed: Did the pt receive and understand the discharge instructions provided? Yes  Medications obtained and verified? Yes  Other? No  Any new allergies since your discharge? No  Dietary orders reviewed? Yes Do you have support at home? Yes   Follow up appointments reviewed:  PCP Hospital f/u appt confirmed? No  Scheduled to see  on  @ . Specialist Hospital f/u appt confirmed? No  Scheduled to see  on  @ . Are transportation arrangements needed? No  If their condition worsens, is the pt aware to call PCP or go to the Emergency Dept.? Yes Was the patient provided with contact information for the PCP's office or ED? Yes Was to pt encouraged to call back with questions or concerns? Yes  Rivaan Kendall [facility]  [facility] Population Health Community Resource Care Guide   [email or web address]  ?? [phone]   Website: [email or web address]

## 2023-01-08 ENCOUNTER — Encounter (HOSPITAL_BASED_OUTPATIENT_CLINIC_OR_DEPARTMENT_OTHER): Payer: Self-pay | Admitting: Otolaryngology

## 2023-01-13 ENCOUNTER — Encounter (HOSPITAL_BASED_OUTPATIENT_CLINIC_OR_DEPARTMENT_OTHER): Payer: Self-pay | Admitting: Otolaryngology

## 2023-01-13 NOTE — Anesthesia Preprocedure Evaluation (Addendum)
Anesthesia Evaluation  Patient identified by MRN, date of birth, ID band Patient awake    Reviewed: Allergy & Precautions, NPO status , Patient's Chart, lab work & pertinent test results, reviewed documented beta blocker date and time   Airway Mallampati: II  TM Distance: >3 FB Neck ROM: Full    Dental no notable dental hx. (+) Teeth Intact, Dental Advisory Given   Pulmonary sleep apnea , COPD, former smoker   Pulmonary exam normal breath sounds clear to auscultation       Cardiovascular hypertension, Pt. on medications + CAD and + Peripheral Vascular Disease  Normal cardiovascular exam Rhythm:Regular Rate:Normal     Neuro/Psych  Headaches PSYCHIATRIC DISORDERS Anxiety Depression    Hx/o Bell's palsy  Neuromuscular disease    GI/Hepatic Neg liver ROS,GERD  Medicated,,  Endo/Other  Obesity Hyperlipidemia  Renal/GU negative Renal ROS   BPH Elevated PSA    Musculoskeletal Polyarthralgia   Abdominal  (+) + obese  Peds  Hematology negative hematology ROS (+) Plavix therapy- last dose 5/28   Anesthesia Other Findings   Reproductive/Obstetrics Sexual dysfunction                              Anesthesia Physical Anesthesia Plan  ASA: 3  Anesthesia Plan: General   Post-op Pain Management: Minimal or no pain anticipated   Induction: Intravenous  PONV Risk Score and Plan: 2 and Treatment may vary due to age or medical condition and Propofol infusion  Airway Management Planned: Natural Airway  Additional Equipment: None  Intra-op Plan:   Post-operative Plan:   Informed Consent: I have reviewed the patients History and Physical, chart, labs and discussed the procedure including the risks, benefits and alternatives for the proposed anesthesia with the patient or authorized representative who has indicated his/her understanding and acceptance.     Dental advisory given  Plan  Discussed with: Anesthesiologist and CRNA  Anesthesia Plan Comments:          Anesthesia Quick Evaluation

## 2023-01-15 ENCOUNTER — Ambulatory Visit (HOSPITAL_BASED_OUTPATIENT_CLINIC_OR_DEPARTMENT_OTHER): Payer: PPO | Admitting: Anesthesiology

## 2023-01-15 ENCOUNTER — Encounter (HOSPITAL_BASED_OUTPATIENT_CLINIC_OR_DEPARTMENT_OTHER)
Admission: RE | Disposition: A | Discharge status: Home / Self Care | Payer: Self-pay | Source: Home / Self Care | Attending: Otolaryngology

## 2023-01-15 ENCOUNTER — Encounter (HOSPITAL_BASED_OUTPATIENT_CLINIC_OR_DEPARTMENT_OTHER): Payer: Self-pay | Admitting: Otolaryngology

## 2023-01-15 ENCOUNTER — Ambulatory Visit (HOSPITAL_BASED_OUTPATIENT_CLINIC_OR_DEPARTMENT_OTHER)
Admission: RE | Admit: 2023-01-15 | Discharge: 2023-01-15 | Disposition: A | Discharge status: Home / Self Care | Payer: PPO | Attending: Otolaryngology | Admitting: Otolaryngology

## 2023-01-15 DIAGNOSIS — J449 Chronic obstructive pulmonary disease, unspecified: Secondary | ICD-10-CM | POA: Diagnosis not present

## 2023-01-15 DIAGNOSIS — Z87891 Personal history of nicotine dependence: Secondary | ICD-10-CM | POA: Diagnosis not present

## 2023-01-15 DIAGNOSIS — G4733 Obstructive sleep apnea (adult) (pediatric): Secondary | ICD-10-CM | POA: Diagnosis not present

## 2023-01-15 DIAGNOSIS — I10 Essential (primary) hypertension: Secondary | ICD-10-CM

## 2023-01-15 DIAGNOSIS — I2510 Atherosclerotic heart disease of native coronary artery without angina pectoris: Secondary | ICD-10-CM

## 2023-01-15 DIAGNOSIS — Z01818 Encounter for other preprocedural examination: Secondary | ICD-10-CM

## 2023-01-15 HISTORY — DX: Chronic obstructive pulmonary disease, unspecified: J44.9

## 2023-01-15 HISTORY — PX: DRUG INDUCED ENDOSCOPY: SHX6808

## 2023-01-15 SURGERY — DRUG INDUCED SLEEP ENDOSCOPY
Anesthesia: General | Site: Nose | Laterality: Right

## 2023-01-15 MED ORDER — PROPOFOL 10 MG/ML IV BOLUS
INTRAVENOUS | Status: DC | PRN
  Administered 2023-01-15: 10 mg via INTRAVENOUS
  Administered 2023-01-15: 20 mg via INTRAVENOUS
  Administered 2023-01-15: 10 mg via INTRAVENOUS
  Administered 2023-01-15 (×2): 20 mg via INTRAVENOUS
  Administered 2023-01-15: 10 mg via INTRAVENOUS

## 2023-01-15 MED ORDER — OXYMETAZOLINE HCL 0.05 % NA SOLN
NASAL | Status: DC | PRN
  Administered 2023-01-15: 1

## 2023-01-15 MED ORDER — LACTATED RINGERS IV SOLN: INTRAVENOUS | Status: DC

## 2023-01-15 MED ORDER — PROPOFOL 500 MG/50ML IV EMUL
INTRAVENOUS | Status: DC | PRN
  Administered 2023-01-15: 75 ug/kg/min via INTRAVENOUS

## 2023-01-15 MED ORDER — LIDOCAINE 2% (20 MG/ML) 5 ML SYRINGE
INTRAMUSCULAR | Status: DC | PRN
  Administered 2023-01-15: 100 mg via INTRAVENOUS

## 2023-01-15 SURGICAL SUPPLY — 12 items
CANISTER SUCT 1200ML W/VALVE (MISCELLANEOUS) ×1 IMPLANT
GLOVE BIO SURGEON STRL SZ7.5 (GLOVE) ×1 IMPLANT
GLOVE BIOGEL PI IND STRL 7.0 (GLOVE) IMPLANT
KIT CLEAN ENDO (MISCELLANEOUS) ×1 IMPLANT
NDL HYPO 27GX1-1/4 (NEEDLE) IMPLANT
NEEDLE HYPO 27GX1-1/4 (NEEDLE) IMPLANT
PATTIES SURGICAL .5 X3 (DISPOSABLE) ×1 IMPLANT
SHEET MEDIUM DRAPE 40X70 STRL (DRAPES) ×1 IMPLANT
SOL ANTI FOG 6CC (MISCELLANEOUS) ×1 IMPLANT
SYR CONTROL 10ML LL (SYRINGE) IMPLANT
TOWEL GREEN STERILE FF (TOWEL DISPOSABLE) ×1 IMPLANT
TUBE CONNECTING 20X1/4 (TUBING) ×1 IMPLANT

## 2023-01-15 NOTE — Anesthesia Postprocedure Evaluation (Signed)
Anesthesia Post Note  Patient: Todd Weeks  Procedure(s) Performed: DRUG INDUCED SLEEP  ENDOSCOPY (Right: Nose)     Patient location during evaluation: PACU Anesthesia Type: General Level of consciousness: awake and alert and oriented Pain management: pain level controlled Vital Signs Assessment: post-procedure vital signs reviewed and stable Respiratory status: spontaneous breathing, nonlabored ventilation and respiratory function stable Cardiovascular status: blood pressure returned to baseline and stable Postop Assessment: no apparent nausea or vomiting Anesthetic complications: no   No notable events documented.  Last Vitals:  Vitals:   01/15/23 1200 01/15/23 1207  BP: 128/66 (!) 130/90  Pulse: 62 69  Resp: 12 14  Temp:  36.7 C  SpO2: 94% 96%    Last Pain:  Vitals:   01/15/23 1207  TempSrc:   PainSc: 0-No pain                 Nyxon Strupp A.

## 2023-01-15 NOTE — Op Note (Signed)
Preop diagnosis: Obstructive sleep apnea Postop diagnosis: same Procedure: Drug-induced sleep endoscopy Surgeon: Gurvir Schrom Anesth: IV sedation Compl: None Findings: There is 50% anterior-posterior and 50% side wall collapse at the velum making him a candidate for hypoglossal nerve stimulator placement.  There was also marked anterior-posterior collapse at the tongue base. Description:  After discussing risks, benefits, and alternatives, the patient was brought to the operative suite and placed on the operative table in the supine position.  Anesthesia was induced and the patient was given light sedation to simulate natural sleep. When the proper level was reached, an Afrin-soaked pledget was placed in the right nasal passage for a couple of minutes and then removed.  The fiberoptic laryngoscope was then passed to view the pharynx and larynx.  Findings are noted above and the exam was recorded.  After completion, the scope was removed and the patient was returned to anesthesia for wakeup and was moved to the recovery room in stable condition.  

## 2023-01-15 NOTE — H&P (Signed)
Todd Weeks is an 67 y.o. male.   Chief Complaint: Sleep apnea HPI: 67 year old male with obstructive sleep apnea who has been unable to tolerate CPAP.   Past Medical History:  Diagnosis Date   Bell's palsy    Cataract    Mixed form OU   Closed nondisplaced fracture of neck of third metacarpal bone of right hand 02/04/2017   Clotting disorder (HCC)    COPD (chronic obstructive pulmonary disease) (HCC)    Depression    Diverticulitis    GERD (gastroesophageal reflux disease)    Headache(784.0)    HTN (hypertension)    Hyperlipidemia    Hypertensive retinopathy    OU   Mild CAD 2012   PVD (peripheral vascular disease) (HCC)    Retinal detachment    Sexual dysfunction    Sleep apnea     Past Surgical History:  Procedure Laterality Date   ABDOMINAL AORTOGRAM W/LOWER EXTREMITY Bilateral 02/05/2020   Procedure: ABDOMINAL AORTOGRAM W/LOWER EXTREMITY;  Surgeon: Sherren Kerns, MD;  Location: [facility];  Service: Cardiovascular;  Laterality: Bilateral;   ABDOMINAL AORTOGRAM W/LOWER EXTREMITY N/A 12/16/2020   Procedure: ABDOMINAL AORTOGRAM W/LOWER EXTREMITY;  Surgeon: Sherren Kerns, MD;  Location: [facility];  Service: Cardiovascular;  Laterality: N/A;   CATARACT EXTRACTION     EYE SURGERY Right 05/06/2020   Pneumatic retinopexy for rheg. RD repair - Dr. Rennis Chris   EYE SURGERY Right 05/26/2020   PPV/MP - Dr. Rennis Chris   GAS INSERTION Right 05/26/2020   Procedure: INSERTION OF GAS;  Surgeon: Rennis Chris, MD;  Location: [facility] OR;  Service: Ophthalmology;  Laterality: Right;   INGUINAL HERNIA REPAIR Right 05/29/2021   Procedure: RIGHT INGUINAL HERNIA REPAIR WITH MESH;  Surgeon: Griselda Miner, MD;  Location: [facility];  Service: General;  Laterality: Right;  TAP BLOCK   LEFT HEART CATH AND CORONARY ANGIOGRAPHY N/A 04/05/2021   Procedure: LEFT HEART CATH AND CORONARY ANGIOGRAPHY;  Surgeon: Kathleene Hazel, MD;  Location: [facility];  Service: Cardiovascular;  Laterality: N/A;   MEMBRANE PEEL Right 05/26/2020   Procedure: MEMBRANE PEEL;  Surgeon: Rennis Chris, MD;  Location: [facility] OR;  Service: Ophthalmology;  Laterality: Right;   PARS PLANA VITRECTOMY Right 05/26/2020   Procedure: PARS PLANA VITRECTOMY WITH 25 GAUGE;  Surgeon: Rennis Chris, MD;  Location: [facility] OR;  Service: Ophthalmology;  Laterality: Right;   PERIPHERAL VASCULAR INTERVENTION Right 12/16/2020   Procedure: PERIPHERAL VASCULAR INTERVENTION;  Surgeon: Sherren Kerns, MD;  Location: [facility] INVASIVE CV LAB;  Service: Cardiovascular;  Laterality: Right;  SFA (distal)   PHOTOCOAGULATION Right 05/26/2020   Procedure: PHOTOCOAGULATION;  Surgeon: Rennis Chris, MD;  Location: [facility] OR;  Service: Ophthalmology;  Laterality: Right;   PROSTATE BIOPSY  2015   RETINAL DETACHMENT SURGERY Right 05/06/2020   Pneumatic retinopexy for rheg RD repair - Dr. Rennis Chris   RETINAL DETACHMENT SURGERY Right 05/26/2020   PPV/MP - Dr. Rennis Chris    Family History  Problem Relation Age of Onset   Hypertension Mother    Deep vein thrombosis Father    Diabetes Brother    Tuberculosis Maternal Grandfather    Emphysema Maternal Grandfather    Lung disease Brother    Asthma Son    Rectal cancer Neg Hx    Colon cancer Neg Hx    Social History:  reports that he quit smoking about 12 years ago. His smoking use included cigarettes. He  has a 69.00 pack-year smoking history. He has never used smokeless tobacco. He reports current alcohol use of about 4.0 standard drinks of alcohol per week. He reports that he does not currently use drugs after having used the following drugs: Cocaine, Marijuana, and "Crack" cocaine.  Allergies: No Known Allergies  Medications Prior to Admission  Medication Sig Dispense Refill   aspirin EC 81 MG tablet Take 1 tablet (81 mg total) by mouth daily. 150 tablet 2   atorvastatin (LIPITOR) 40 MG tablet Take 1 tablet (40 mg total) by mouth daily. 90  tablet 3   clopidogrel (PLAVIX) 75 MG tablet Take 1 tablet (75 mg total) by mouth daily. 90 tablet 3   famotidine (PEPCID) 20 MG tablet Take 1 tablet (20 mg total) by mouth daily. 90 tablet 3   FLUoxetine (PROZAC) 40 MG capsule TAKE (1) CAPSULE BY MOUTH DAILY 90 capsule 3   gabapentin (NEURONTIN) 100 MG capsule Take 1-3 capsules (100-300 mg total) by mouth at bedtime as needed. 90 capsule 1   irbesartan (AVAPRO) 300 MG tablet Take 1 tablet (300 mg total) by mouth daily. 90 tablet 3   tamsulosin (FLOMAX) 0.4 MG CAPS capsule Take 1 capsule (0.4 mg total) by mouth daily. 90 capsule 3   ALPRAZolam (XANAX) 0.25 MG tablet Take 1 tablet (0.25 mg total) by mouth 2 (two) times daily as needed for anxiety. 20 tablet 0   diclofenac Sodium (VOLTAREN) 1 % GEL Apply 1 application topically 3 (three) times daily as needed (leg cramps/spasms).     nitroGLYCERIN (NITROSTAT) 0.4 MG SL tablet Place 0.4 mg under the tongue every 5 (five) minutes as needed for chest pain.      No results found for this or any previous visit (from the past 48 hour(s)). No results found.  Review of Systems  All other systems reviewed and are negative.   Height 6' (1.829 m), weight 104.3 kg. Physical Exam Constitutional:      Appearance: Normal appearance. He is normal weight.  HENT:     Head: Normocephalic and atraumatic.     Right Ear: External ear normal.     Left Ear: External ear normal.     Nose: Nose normal.     Mouth/Throat:     Mouth: Mucous membranes are moist.     Pharynx: Oropharynx is clear.  Eyes:     Extraocular Movements: Extraocular movements intact.     Conjunctiva/sclera: Conjunctivae normal.     Pupils: Pupils are equal, round, and reactive to light.  Cardiovascular:     Rate and Rhythm: Normal rate.  Pulmonary:     Effort: Pulmonary effort is normal.  Musculoskeletal:     Cervical back: Normal range of motion.  Skin:    General: Skin is warm and dry.  Neurological:     General: No focal  deficit present.     Mental Status: He is alert and oriented to person, place, and time.  Psychiatric:        Mood and Affect: Mood normal.        Behavior: Behavior normal.        Thought Content: Thought content normal.        Judgment: Judgment normal.      Assessment/Plan Obstructive sleep apnea and BMI 31.71  To OR for sleep endoscopy.  Christia Reading, MD 01/15/2023, 10:20 AM

## 2023-01-15 NOTE — Brief Op Note (Signed)
01/15/2023  11:36 AM  PATIENT:  Todd Weeks  67 y.o. male  PRE-OPERATIVE DIAGNOSIS:  BMI 31.0-31.9,adult obstructive sleep apnea  POST-OPERATIVE DIAGNOSIS:  BMI 31.0-31.9,adultobstructive sleep apnea  PROCEDURE:  Procedure(s): DRUG INDUCED SLEEP  ENDOSCOPY (Bilateral)  SURGEON:  Surgeon(s) and Role:    Christia Reading, MD - Primary  PHYSICIAN ASSISTANT:   ASSISTANTS: none   ANESTHESIA:   IV sedation  EBL:  None   BLOOD ADMINISTERED:none  DRAINS: none   LOCAL MEDICATIONS USED:  NONE  SPECIMEN:  No Specimen  DISPOSITION OF SPECIMEN:  N/A  COUNTS:  YES  TOURNIQUET:  * No tourniquets in log *  DICTATION: .Note written in EPIC  PLAN OF CARE: Discharge to home after PACU  PATIENT DISPOSITION:  PACU - hemodynamically stable.   Delay start of Pharmacological VTE agent (>24hrs) due to surgical blood loss or risk of bleeding: no

## 2023-01-15 NOTE — Transfer of Care (Addendum)
Immediate Anesthesia Transfer of Care Note  Patient: Todd Weeks  Procedure(s) Performed: DRUG INDUCED SLEEP  ENDOSCOPY (Right: Nose)  Patient Location: PACU  Anesthesia Type:General  Level of Consciousness: awake, alert , and oriented  Airway & Oxygen Therapy: Patient Spontanous Breathing and Patient connected to nasal cannula oxygen  Post-op Assessment: Report given to RN and Post -op Vital signs reviewed and stable  Post vital signs: Reviewed and stable  Last Vitals:  Vitals Value Taken Time  BP 142/70   Temp    Pulse 64 01/15/23 1141  Resp 19 01/15/23 1141  SpO2 95 % 01/15/23 1141  Vitals shown include unvalidated device data.  Last Pain:  Vitals:   01/15/23 1025  TempSrc: Oral  PainSc: 0-No pain         Complications: No notable events documented.

## 2023-01-15 NOTE — Discharge Instructions (Signed)

## 2023-01-16 ENCOUNTER — Encounter (HOSPITAL_BASED_OUTPATIENT_CLINIC_OR_DEPARTMENT_OTHER): Payer: Self-pay | Admitting: Otolaryngology

## 2023-01-21 DIAGNOSIS — M25512 Pain in left shoulder: Secondary | ICD-10-CM | POA: Diagnosis not present

## 2023-03-06 ENCOUNTER — Other Ambulatory Visit: Payer: Self-pay | Admitting: Otolaryngology

## 2023-03-10 NOTE — Progress Notes (Unsigned)
Subjective:   Patient ID: Todd Weeks, male    DOB: [birth year]     MRN: [id number]  Brief patient profile:  61 yowm quit smoking 2013 with doe @ 220 and wt gain and doe gradually worse since then so referred to pulmonary clinic 10/27/2015 by Dr Carver Fila.   History of Present Illness  10/27/2015 1st [facility] office visit/ Stella Bortle   Chief Complaint  Patient presents with   Pulmonary Consult    Referred by Jeanine Luz, NP. Pt c/o SOB x 2 yrs. He feels SOB with or without any exertion. Sometimes wakes up in the night struggling to catch his breath.   x years activity limited by pain in legs across the parking lot but not sob  For the past year, more episodes of sob  at rest/ more noct awakening smothering / worse bending over assoc with overt hb but no assoc cough  Able to sleep on side horizontal  rec Stop lisinopril and start avapro (ibesartan ) 300 mg daily instead/ break in half if too strong Pepcid 20 mg at bedtime GERD diet    11/22/2015  f/u ov/[facility]our[facility] re: cough /sob with no significant airflow obst on PFT's 10/27/15  Chief Complaint  Patient presents with   Follow-up    Feeling better 75%,sob-better.Sleeping better.  on pepcid at hs and off acei and no cough/ only doe = MMRC1 = can walk nl pace, flat grade, can't hurry or go uphills or steps s sob   Rec In the event that cough/ breathing worsen > pepcid 20 mg after breakfast and bedtime  Pulmonary follow up is as needed    Saw [facility] 08/09/2022  for cough /  osa/ could not to cpap > referred to ENT for Inspire  and rx doxy    03/11/2023 ACUTE ov/[place] office/Sumiko Ceasar re:  noct sob with no significant airflow obst on PFT's 10/27/15  maint on no resp medications   Chief Complaint  Patient presents with   Shortness of Breath    Increased shob xseveral weeks, not sleeping well  Dyspnea:  works as Investment banker, 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  Takes pepcid in am only  SABA use: none  02: none       No obvious day to day or daytime variability or assoc excess/ purulent sputum or mucus plugs or hemoptysis or cp or chest tightness, subjective wheeze or overt sinus  symptoms.     Also denies any obvious fluctuation of symptoms with weather or environmental changes or other aggravating or alleviating factors except as outlined above   No unusual exposure hx or h/o childhood pna/ asthma or knowledge of premature birth.  Current Allergies, Complete Past Medical History, Past Surgical History, Family History, and Social History were reviewed in [employer] record.  ROS  The following are not active complaints unless bolded Hoarseness, sore throat, dysphagia, dental problems, itching, sneezing,  nasal congestion or discharge of excess mucus or purulent secretions, ear ache,   fever, chills, sweats, unintended wt loss or wt gain, classically pleuritic or  exertional cp,  orthopnea s pnd(breathing does not actually disturb his sleep once he is asleep) or arm/hand swelling  or leg swelling, presyncope, palpitations, abdominal pain, anorexia, nausea, vomiting, diarrhea  or change in bowel habits or change in bladder habits, change in stools or change in urine, dysuria, hematuria,  rash, arthralgias, visual complaints, headache, numbness, weakness or ataxia or problems with walking or coordination,  change in mood or  memory.        Current Meds  Medication Sig   amoxicillin (AMOXIL) 875 MG tablet Take 875 mg by mouth 2 (two) times daily.   aspirin EC 81 MG tablet Take 1 tablet (81 mg total) by mouth daily.   atorvastatin (LIPITOR) 40 MG tablet Take 1 tablet (40 mg total) by mouth daily.   clopidogrel (PLAVIX) 75  MG tablet Take 1 tablet (75 mg total) by mouth daily.   diclofenac Sodium (VOLTAREN) 1 % GEL Apply 1 application topically 3 (three) times daily as needed (leg cramps/spasms).   famotidine (PEPCID) 20 MG tablet Take 1 tablet (20 mg total) by mouth daily.   FLUoxetine (PROZAC) 40 MG capsule TAKE (1) CAPSULE BY MOUTH DAILY   gabapentin (NEURONTIN) 100 MG capsule Take 1-3 capsules (100-300 mg total) by mouth at bedtime as needed.   irbesartan (AVAPRO) 300 MG tablet Take 1 tablet (300 mg total) by mouth daily.   nitroGLYCERIN (NITROSTAT) 0.4 MG SL tablet Place 0.4 mg under the tongue every 5 (five) minutes as needed for chest pain.   pantoprazole (PROTONIX) 40 MG tablet Take 1 tablet (40 mg total) by mouth daily. Take 30-60 min before first meal of the day   tamsulosin (FLOMAX) 0.4 MG CAPS capsule Take 1 capsule (0.4 mg total) by mouth daily.   [DISCONTINUED] ALPRAZolam (XANAX) 0.25 MG tablet Take 1 tablet (0.25 mg total) by mouth 2 (two) times daily as needed for anxiety.                       Objective:   Physical Exam  wts  03/11/2023        234  Wt Readings from Last 3 Encounters:  03/11/23 234 lb (106.1 kg)  01/15/23 235 lb 7.2 oz (106.8 kg)  12/28/22 230 lb (104.3 kg)   11/22/2015        297   10/27/15 302 lb 9.6 oz (137.258 kg)  08/11/15 297 lb (134.718 kg)  06/01/15 293 lb (132.904 kg)    Vital signs reviewed  03/11/2023  - Note at rest 02 sats  94% on RA   General appearance:    mod obese pleasant amb wm nad   HEENT : Oropharynx  clear         NECK :  without  apparent JVD/ palpable Nodes/TM    LUNGS: no acc muscle use,  Nl contour chest which is clear to A and P bilaterally without cough on insp or exp maneuvers   CV:  RRR  no s3 or murmur or increase in P2, and no edema   ABD:  soft and nontender with nl inspiratory excursion in the supine position. No bruits or organomegaly appreciated   MS:  Nl gait/ ext warm without deformities Or obvious joint restrictions   calf tenderness, cyanosis or clubbing    SKIN: warm and dry without lesions    NEURO:  alert, approp, nl sensorium with  no motor or cerebellar deficits apparent.        Assessment & Plan:

## 2023-03-11 ENCOUNTER — Ambulatory Visit: Payer: PPO | Admitting: Internal Medicine

## 2023-03-11 ENCOUNTER — Encounter: Payer: Self-pay | Admitting: Internal Medicine

## 2023-03-11 VITALS — BP 145/85 | HR 71 | Ht 72.0 in | Wt 234.0 lb

## 2023-03-11 DIAGNOSIS — R0609 Other forms of dyspnea: Secondary | ICD-10-CM | POA: Diagnosis not present

## 2023-03-11 MED ORDER — ALPRAZOLAM 0.25 MG PO TABS: 0.2500 mg | ORAL_TABLET | Freq: Two times a day (BID) | ORAL | 0 refills | Status: DC | PRN

## 2023-03-11 MED ORDER — PANTOPRAZOLE SODIUM 40 MG PO TBEC: 40.0000 mg | DELAYED_RELEASE_TABLET | Freq: Every day | ORAL | 2 refills | Status: AC

## 2023-03-11 NOTE — Patient Instructions (Addendum)
If wake up and can't get back to sleep due to breathing, try sleeping in recliner or on a trail basis  you can use xanax (alprazolam) 0.25 mg one per night as it worked for you in past for same symptoms  Pantoprazole (protonix) 40 mg   Take  30-60 min before last  meal of the day and Pepcid (famotidine)  20 mg at supper  until return to office - this is the best way to tell whether stomach acid is contributing to your problem.    GERD (REFLUX)  is an extremely common cause of respiratory symptoms just like yours , many times with no obvious heartburn at all.    It can be treated with medication, but also with lifestyle changes including elevation of the head of your bed (ideally with 6-8inch blocks under the headboard of your bed),  Smoking cessation, avoidance of late meals, excessive alcohol, and avoid fatty foods, chocolate, peppermint, colas, red wine, and acidic juices such as orange juice.  NO MINT OR MENTHOL PRODUCTS SO NO COUGH DROPS  USE SUGARLESS CANDY INSTEAD (Jolley ranchers or Stover's or Life Savers) or even ice chips will also do - the key is to swallow to prevent all throat clearing. NO OIL BASED VITAMINS - use powdered substitutes.  Avoid fish oil when coughing.   Follow up is as needed

## 2023-03-11 NOTE — Assessment & Plan Note (Signed)
Quit smoking 2013  10/27/2015-spirometry in office- normal spirometry, FVC 4.9 (99% predicted), ratio 71, FEV1 91% 10/28/2014-pulmonary function test- FVC 4.61 (88% predicted), postbronchodilator ratio 74, FEV1 88, no bronchodilator response, DLCO 84, minimal airway obstruction, suggesting small airway disease 07/04/2018-pulmonary function test- FVC 5.00 (97% predicted), postbronchodilator ratio 75, FEV1 97, no significant bronchodilator response, DLCO 89 >>>Patient did take daily dose of Symbicort 160 before testing - 03/11/2023  @ wt 234  Walked on RA  x  3  lap(s) =  approx 450  ft  @ very brisk pace, stopped due to end of study  with lowest 02 sats 93% and no sob or cp   Noct symptoms are chronic and "not as bad as before" which was when I saw him for the same problem while of ACEi and much better p d'cd   This is typical of  Upper airway cough syndrome (previously labeled PNDS),  is so named because it's frequently impossible to sort out how much is  CR/sinusitis with freq throat clearing (which can be related to primary GERD)   vs  causing  secondary (" extra esophageal")  GERD from wide swings in gastric pressure that occur with throat clearing, often  promoting self use of mint and menthol lozenges that reduce the lower esophageal sphincter tone and exacerbate the problem further in a cyclical fashion.   These are the same pts (now being labeled as having "irritable larynx syndrome" by some cough centers) who not infrequently have a history of having failed to tolerate ace inhibitors,  dry powder inhalers or biphosphonates or report having atypical/extraesophageal reflux symptoms that don't respond to standard doses of PPI  and are easily confused as having aecopd or asthma flares by even experienced allergists/ pulmonologists (myself included).   Although he doesn't have the typical cough or hoarseness on would expect, and thus panic disorder suggested (responded to xanax low dose in past ) I  still feel trial of ppi before his last meal and pepcid 20 mg hs best initial approach here as he looked so good on his walking study.  If not rapidly improving though advised to return to complete w/u for unexplained noct sob  Each maintenance medication was reviewed in detail including emphasizing most importantly the difference between maintenance and prns and under what circumstances the prns are to be triggered using an action plan format where appropriate.  Total time for H and P, chart review, counseling,  directly observing portions of ambulatory 02 saturation study/ and generating customized AVS unique to this ACUTE  office visit / same day charting > 40 min eval for pt not sen by me since 2017

## 2023-03-26 ENCOUNTER — Encounter (HOSPITAL_BASED_OUTPATIENT_CLINIC_OR_DEPARTMENT_OTHER): Payer: Self-pay | Admitting: Otolaryngology

## 2023-03-26 ENCOUNTER — Other Ambulatory Visit: Payer: Self-pay

## 2023-03-26 NOTE — Progress Notes (Addendum)
   03/26/23 1153  PAT Phone Screen  Is the patient taking a GLP-1 receptor agonist? No  Do You Have Diabetes? No  Do You Have Hypertension? No  Have You Ever Been to the ER for Asthma? No  Have You Taken Oral Steroids in the Past 3 Months? No  Do you Take Phenteramine or any Other Diet Drugs? No  Recent  Lab Work, EKG, CXR? Yes  Where was this test performed? EKG 09/27/22  Do you have a history of heart problems? No;Yes  Cardiologist Name Dr Clifton James [facility] 09/27/22  Have you ever had tests on your heart? Yes  What date/year were cardiac tests completed? last echo 2019 EF 55-60%  Results viewable: [facility] Media Tab  Any Recent Hospitalizations? No  Height 6' (1.829 m)  Weight 104.3 kg  Pat Appointment Scheduled No  Reason for No Appointment Not Needed   Will hold aspirin and Plavix as directed by Dr Jimmey Ralph prescribing MD- LVM w/ Angie requesting instructions. Reviewed with Dr Armond Hang- no further clearance needed.

## 2023-03-29 ENCOUNTER — Encounter (HOSPITAL_COMMUNITY): Payer: Self-pay | Admitting: Anesthesiology

## 2023-03-29 ENCOUNTER — Telehealth: Payer: Self-pay | Admitting: Cardiovascular Disease

## 2023-03-29 NOTE — Telephone Encounter (Signed)
   Pre-operative Risk Assessment    Patient Name: Todd Weeks  DOB: 1956/04/08 MRN: [id number]      Request for Surgical Clearance    Procedure:   Inspire  Date of Surgery:  Clearance 04/02/23                                 Surgeon:  Dr. Christia Reading Surgeon's Group or Practice Name:  [facility] Phone number:  [phone]  Fax number:  [phone]   Type of Clearance Requested:   - Cardiac - Pharmacy:  Hold Aspirin and Clopidogrel (Plavix)     Type of Anesthesia:  General    Additional requests/questions:   Caller stated patient will need cardiac and pharmacy clearance.  Caller stated patient took his medication  today.  Signed, Annetta Maw   03/29/2023, 4:57 PM

## 2023-04-01 ENCOUNTER — Telehealth: Payer: Self-pay | Admitting: Family Medicine

## 2023-04-01 ENCOUNTER — Telehealth: Payer: Self-pay | Admitting: Cardiovascular Disease

## 2023-04-01 NOTE — Telephone Encounter (Signed)
Caller was Clara from [employer]. Caller states patient is set to have a procedure tomorrow. States they were under impression that cardiology was managing the PLAVIX but found out today that it is his PCP.   They have surgical clearance from cardiology but need to know from PCP, if he is currently the one monitoring the plavix and if so, can patient stop it for the surgery.    Right after call disconnected, patient called in explaining the situation and states he was informed by pre-op to stop his plavix 5 days prior. States he stopped the medication 4 days ago. He would like to know if it's okay that he skip another day in order to have the procedure.

## 2023-04-01 NOTE — Telephone Encounter (Signed)
Call # Clara [phone] unable to contact Clara to give information. Please verified phone number    Spoke with patient stated surgery proponed  Patient aware ok to stop Plavix 5 days prior surgery

## 2023-04-01 NOTE — Telephone Encounter (Signed)
Pt cancelled appt

## 2023-04-01 NOTE — Telephone Encounter (Signed)
Todd Weeks calling back about his clearance. Informed her I would reach out to preop to see if they have had a tele cancellation prior to 04/10/23, if not we may have some availability for an in office visit. She also stated surgery cannot be pushed out due to authorization.    Todd Weeks asked for someone to c/b her back when appt has been scheduled

## 2023-04-01 NOTE — Telephone Encounter (Signed)
Number provided was the number they called from. Todd Weeks stated she would call back earlier after speaking with cardiology but never did.

## 2023-04-01 NOTE — Telephone Encounter (Signed)
   Name: Todd Weeks  DOB: Jun 29, 1956  MRN: [id number]  Primary Cardiologist: Verne Carrow, MD   Preoperative team, please contact this patient and set up a phone call appointment for further preoperative risk assessment. Please obtain consent and complete medication review. Thank you for your help.  I confirm that guidance regarding antiplatelet and oral anticoagulation therapy has been completed and, if necessary, noted below.  His aspirin and Plavix are not prescribed by cardiology.  Recommendations for holding aspirin and Plavix will need to come from prescribing providers.   Ronney Asters, NP 04/01/2023, 8:22 AM [facility]

## 2023-04-01 NOTE — Telephone Encounter (Signed)
Please advise 

## 2023-04-01 NOTE — Telephone Encounter (Signed)
I spoke to Todd Weeks at [facility]. She explained that anesthesiologist called their office late Friday to say Todd Weeks needed a clearance. She then called our office. I let her know that Todd Weeks will need aspirin and plavix instructions from Dr. Jimmey Ralph. We also do not have an available tele for cardiac clearance until 08/28.  [facility] will cancel procedure for tomorrow. Albin Felling will call Todd Weeks to reschedule.

## 2023-04-01 NOTE — Telephone Encounter (Signed)
I spoke with Todd Weeks who stated was upset and cancelled his procedure.

## 2023-04-01 NOTE — Telephone Encounter (Signed)
Follow Up:      Albin Felling called back. She wanted you to know that patient said he took his Aspirin and Plavix last on Friday morning @ 6:00 AM.  She said he needs to know today was that enough time in order for him to have his procedure  on 04-02-23?

## 2023-04-01 NOTE — Telephone Encounter (Signed)
Yes it is ok for him to stop the Plavix prior to surgery.  Katina Degree. Jimmey Ralph, MD 04/01/2023 9:38 AM

## 2023-04-01 NOTE — Telephone Encounter (Signed)
I spoke to pt and let pt know that earliest available appt is 08/28. Pt is very unhappy. I explained that we did not receive clearance until late Friday, 08/16. Pt is to call PCP about holding plavix and aspirin.

## 2023-04-01 NOTE — Telephone Encounter (Signed)
Pt needs a virtual pre-op appointment before his surgery on 8/28. Please advise

## 2023-04-02 ENCOUNTER — Ambulatory Visit (HOSPITAL_COMMUNITY): Admission: RE | Admit: 2023-04-02 | Payer: PPO | Source: Home / Self Care | Admitting: Otolaryngology

## 2023-04-02 SURGERY — IMPLANTATION OF HYPOGLOSSAL NERVE STIMULATOR
Anesthesia: General | Laterality: Right

## 2023-04-03 ENCOUNTER — Encounter: Payer: Self-pay | Admitting: Family Medicine

## 2023-04-03 ENCOUNTER — Ambulatory Visit (INDEPENDENT_AMBULATORY_CARE_PROVIDER_SITE_OTHER): Payer: PPO | Admitting: Family Medicine

## 2023-04-03 VITALS — BP 121/78 | HR 67 | Temp 98.0°F | Ht 72.0 in | Wt 238.4 lb

## 2023-04-03 DIAGNOSIS — I2510 Atherosclerotic heart disease of native coronary artery without angina pectoris: Secondary | ICD-10-CM

## 2023-04-03 DIAGNOSIS — G4733 Obstructive sleep apnea (adult) (pediatric): Secondary | ICD-10-CM

## 2023-04-03 DIAGNOSIS — I10 Essential (primary) hypertension: Secondary | ICD-10-CM | POA: Diagnosis not present

## 2023-04-03 NOTE — Assessment & Plan Note (Signed)
At goal on irbesartan 300 mg daily.

## 2023-04-03 NOTE — Progress Notes (Signed)
   Todd Weeks is a 67 y.o. male who presents today for an office visit.  Assessment/Plan:  New/Acute Problems: Encounter for surgical clearance Has upcoming surgery with ENT for inspire device placement.  Is okay for him to hold Plavix for 5 days prior to his surgery.  We will communicate this with his surgeon's office also provide him with a letter stating this.  He has received cardiac clearance already.  He is optimized from medical standpoint.  Cough Likely due to viral URI.  Reassuring lung exam today and symptoms are improving.  He will let us know this does not continue to improve.  Chronic Problems Addressed Today: OSA (obstructive sleep apnea) Will be getting inspire device implant soon via ENT.   CAD (coronary artery disease) Following with cardiology.  On Lipitor and aspirin.  Tolerating well.  As above it is okay for him to hold Plavix prior to surgery.  HTN (hypertension) At goal on irbesartan 300 mg daily.     Subjective:  HPI:  See Assessment / plan for status of chronic conditions.   Patient here today for surgical clearance.  He is planning on getting inspire device placed with ENT.  Has a history of OSA and tried CPAP however this was not effective.  He was initially planning on having his surgery performed yesterday however he needed clearance to hold his Plavix.  He has been in contact with cardiology as well.  Overall feels well.  He has good functional status.  No other concerns today.  He did have a chest cold a few weeks ago.  Had a lingering cough for a few weeks but this seems to be resolving.       Objective:  Physical Exam: BP 121/78   Pulse 67   Temp 98 F (36.7 C) (Temporal)   Ht 6' (1.829 m)   Wt 238 lb 6.4 oz (108.1 kg)   SpO2 97%   BMI 32.33 kg/m   Gen: No acute distress, resting comfortably CV: Regular rate and rhythm with no murmurs appreciated Pulm: Normal work of breathing, clear to auscultation bilaterally with no crackles,  wheezes, or rhonchi Neuro: Grossly normal, moves all extremities Psych: Normal affect and thought content      Todd Springfield M. Jimmey Ralph, MD 04/03/2023 8:21 AM

## 2023-04-03 NOTE — Assessment & Plan Note (Signed)
Following with cardiology.  On Lipitor and aspirin.  Tolerating well.  As above it is okay for him to hold Plavix prior to surgery.

## 2023-04-03 NOTE — Patient Instructions (Signed)
It was very nice to see you today!  IT is fine for you to hold your Plavix.  Please come back soon for your physical.   Return for Annual Physical.   Take care, Dr Jimmey Ralph  PLEASE NOTE:  If you had any lab tests, please let us know if you have not heard back within a few days. You may see your results on mychart before we have a chance to review them but we will give you a call once they are reviewed by [place].   If we ordered any referrals today, please let us know if you have not heard from their office within the next week.   If you had any urgent prescriptions sent in today, please check with the pharmacy within an hour of our visit to make sure the prescription was transmitted appropriately.   Please try these tips to maintain a healthy lifestyle:  Eat at least 3 REAL meals and 1-2 snacks per day.  Aim for no more than 5 hours between eating.  If you eat breakfast, please do so within one hour of getting up.   Each meal should contain half fruits/vegetables, one quarter protein, and one quarter carbs (no bigger than a computer mouse)  Cut down on sweet beverages. This includes juice, soda, and sweet tea.   Drink at least 1 glass of water with each meal and aim for at least 8 glasses per day  Exercise at least 150 minutes every week.

## 2023-04-03 NOTE — Assessment & Plan Note (Signed)
Will be getting inspire device implant soon via ENT.

## 2023-04-29 ENCOUNTER — Ambulatory Visit: Payer: PPO | Admitting: Pulmonary Disease

## 2023-04-29 DIAGNOSIS — M1711 Unilateral primary osteoarthritis, right knee: Secondary | ICD-10-CM | POA: Diagnosis not present

## 2023-05-02 DIAGNOSIS — I8393 Asymptomatic varicose veins of bilateral lower extremities: Secondary | ICD-10-CM

## 2023-05-03 ENCOUNTER — Encounter: Payer: Self-pay | Admitting: Pulmonary Disease

## 2023-05-06 ENCOUNTER — Encounter (HOSPITAL_BASED_OUTPATIENT_CLINIC_OR_DEPARTMENT_OTHER): Payer: Self-pay | Admitting: Otolaryngology

## 2023-05-06 NOTE — Progress Notes (Signed)
LVM with Dr. Jenne Pane office- patient explained during pre op phone call he is having new "breathing problems" and states he "can't take a deep breath". Pt does see pulmonology but he needs clearance/appointment before procedure due to new issues.

## 2023-05-07 ENCOUNTER — Encounter: Payer: Self-pay | Admitting: Pulmonary Disease

## 2023-05-07 ENCOUNTER — Ambulatory Visit: Payer: PPO | Admitting: Pulmonary Disease

## 2023-05-07 ENCOUNTER — Other Ambulatory Visit: Payer: Self-pay | Admitting: Otolaryngology

## 2023-05-07 VITALS — BP 142/72 | HR 75 | Temp 97.6°F | Ht 72.0 in | Wt 242.4 lb

## 2023-05-07 DIAGNOSIS — G4733 Obstructive sleep apnea (adult) (pediatric): Secondary | ICD-10-CM

## 2023-05-07 MED ORDER — ALPRAZOLAM 0.25 MG PO TABS: 0.5000 mg | ORAL_TABLET | Freq: Two times a day (BID) | ORAL | 1 refills | Status: DC | PRN

## 2023-05-07 NOTE — Progress Notes (Unsigned)
Todd Weeks    [id number]    1956-01-15  Primary Care Physician:Parker, Katina Degree, MD  Referring Physician: Ardith Dark, MD [address] [place],  [place] [address]  Chief complaint:   Patient being seen for severe obstructive sleep apnea Intolerant of CPAP therapy Not sleeping well recently  HPI:  Not sleeping well Waking up from sleep feeling short of breath  He is scheduled to have his inspire device implantation soon  Has been using Xanax but does not feel it is effective enough  He does have severe obstructive sleep apnea, not tolerating CPAP well Has not used CPAP in a while  Sleep quality was much worse with trying to get used to CPAP  Diagnosed with severe obstructive sleep apnea  He does have daytime tiredness Usually goes to bed about 11 PM, takes him about an hour to fall asleep Multiple awakenings up to 6 times at night sometimes Usually tries to get out of bed by 6 AM  Tired during the day,  Works 12-hour shifts  Does have a caffeinated beverage in the morning  He does have siblings with sleep apnea  History of obstructive lung disease, coronary artery disease, hypertension  Outpatient Encounter Medications as of 05/07/2023  Medication Sig   aspirin EC 81 MG tablet Take 1 tablet (81 mg total) by mouth daily.   atorvastatin (LIPITOR) 40 MG tablet Take 1 tablet (40 mg total) by mouth daily.   clopidogrel (PLAVIX) 75 MG tablet Take 1 tablet (75 mg total) by mouth daily.   diclofenac Sodium (VOLTAREN) 1 % GEL Apply 1 application topically 3 (three) times daily as needed (leg cramps/spasms).   famotidine (PEPCID) 20 MG tablet Take 1 tablet (20 mg total) by mouth daily.   FLUoxetine (PROZAC) 40 MG capsule TAKE (1) CAPSULE BY MOUTH DAILY   gabapentin (NEURONTIN) 100 MG capsule Take 1-3 capsules (100-300 mg total) by mouth at bedtime as needed.   irbesartan (AVAPRO) 300 MG tablet Take 1 tablet (300 mg total) by mouth daily.   nitroGLYCERIN  (NITROSTAT) 0.4 MG SL tablet Place 0.4 mg under the tongue every 5 (five) minutes as needed for chest pain.   pantoprazole (PROTONIX) 40 MG tablet Take 1 tablet (40 mg total) by mouth daily. Take 30-60 min before first meal of the day   tamsulosin (FLOMAX) 0.4 MG CAPS capsule Take 1 capsule (0.4 mg total) by mouth daily.   [DISCONTINUED] ALPRAZolam (XANAX) 0.25 MG tablet Take 1 tablet (0.25 mg total) by mouth 2 (two) times daily as needed for anxiety.   ALPRAZolam (XANAX) 0.25 MG tablet Take 2 tablets (0.5 mg total) by mouth 2 (two) times daily as needed for anxiety.   No facility-administered encounter medications on file as of 05/07/2023.    Allergies as of 05/07/2023   (No Known Allergies)    Past Medical History:  Diagnosis Date   Bell's palsy    Cataract    Mixed form OU   Closed nondisplaced fracture of neck of third metacarpal bone of right hand 02/04/2017   Clotting disorder (HCC)    COPD (chronic obstructive pulmonary disease) (HCC)    Depression    Diverticulitis    GERD (gastroesophageal reflux disease)    Headache(784.0)    HTN (hypertension)    Hyperlipidemia    Hypertensive retinopathy    OU   Mild CAD 2012   PVD (peripheral vascular disease) (HCC)    Retinal detachment  Sexual dysfunction    Sleep apnea     Past Surgical History:  Procedure Laterality Date   ABDOMINAL AORTOGRAM W/LOWER EXTREMITY Bilateral 02/05/2020   Procedure: ABDOMINAL AORTOGRAM W/LOWER EXTREMITY;  Surgeon: Sherren Kerns, MD;  Location: [facility];  Service: Cardiovascular;  Laterality: Bilateral;   ABDOMINAL AORTOGRAM W/LOWER EXTREMITY N/A 12/16/2020   Procedure: ABDOMINAL AORTOGRAM W/LOWER EXTREMITY;  Surgeon: Sherren Kerns, MD;  Location: [facility];  Service: Cardiovascular;  Laterality: N/A;   CATARACT EXTRACTION     DRUG INDUCED ENDOSCOPY Right 01/15/2023   Procedure: DRUG INDUCED SLEEP  ENDOSCOPY;  Surgeon: Christia Reading, MD;  Location: [facility];  Service: ENT;  Laterality: Right;   EYE SURGERY Right 05/06/2020   Pneumatic retinopexy for rheg. RD repair - Dr. Rennis Chris   EYE SURGERY Right 05/26/2020   PPV/MP - Dr. Rennis Chris   GAS INSERTION Right 05/26/2020   Procedure: INSERTION OF GAS;  Surgeon: Rennis Chris, MD;  Location: [facility] OR;  Service: Ophthalmology;  Laterality: Right;   INGUINAL HERNIA REPAIR Right 05/29/2021   Procedure: RIGHT INGUINAL HERNIA REPAIR WITH MESH;  Surgeon: Griselda Miner, MD;  Location: [facility];  Service: General;  Laterality: Right;  TAP BLOCK   LEFT HEART CATH AND CORONARY ANGIOGRAPHY N/A 04/05/2021   Procedure: LEFT HEART CATH AND CORONARY ANGIOGRAPHY;  Surgeon: Kathleene Hazel, MD;  Location: [facility];  Service: Cardiovascular;  Laterality: N/A;   MEMBRANE PEEL Right 05/26/2020   Procedure: MEMBRANE PEEL;  Surgeon: Rennis Chris, MD;  Location: [facility];  Service: Ophthalmology;  Laterality: Right;   PARS PLANA VITRECTOMY Right 05/26/2020   Procedure: PARS PLANA VITRECTOMY WITH 25 GAUGE;  Surgeon: Rennis Chris, MD;  Location: [facility] OR;  Service: Ophthalmology;  Laterality: Right;   PERIPHERAL VASCULAR INTERVENTION Right 12/16/2020   Procedure: PERIPHERAL VASCULAR INTERVENTION;  Surgeon: Sherren Kerns, MD;  Location: [facility]AS[facility] CV LAB;  Service: Cardiovascular;  Laterality: Right;  SFA (distal)   PHOTOCOAGULATION Right 05/26/2020   Procedure: PHOTOCOAGULATION;  Surgeon: Rennis Chris, MD;  Location: [facility] OR;  Service: Ophthalmology;  Laterality: Right;   PROSTATE BIOPSY  2015   RETINAL DETACHMENT SURGERY Right 05/06/2020   Pneumatic retinopexy for rheg RD repair - Dr. Rennis Chris   RETINAL DETACHMENT SURGERY Right 05/26/2020   PPV/MP - Dr. Rennis Chris    Family History  Problem Relation Age of Onset   Hypertension Mother    Deep vein thrombosis Father    Diabetes Brother    Tuberculosis Maternal Grandfather    Emphysema Maternal Grandfather    Lung  disease Brother    Asthma Son    Rectal cancer Neg Hx    Colon cancer Neg Hx     Social History   Socioeconomic History   Marital status: Single    Spouse name: Not on file   Number of children: 2   Years of education: 12   Highest education level: Not on file  Occupational History   Occupation: Systems analyst   Occupation: Unemployed    Comment: disability  Tobacco Use   Smoking status: Former    Current packs/day: 0.00    Average packs/day: 1.5 packs/day for 46.0 years (69.0 ttl pk-yrs)    Types: Cigarettes    Start date: 12/11/1964    Quit date: 12/12/2010    Years since quitting: 12.4   Smokeless tobacco: Never  Vaping Use   Vaping status: Never Used  Substance and Sexual Activity   Alcohol use: Yes    Alcohol/week: 4.0 standard drinks of alcohol    Types: 4 Cans of beer per week    Comment: a couple of beer on a weekend   Drug use: Not Currently    Types: Cocaine, Marijuana, "Crack" cocaine    Comment: over 25 years ago   Sexual activity: Not on file  Other Topics Concern   Not on file  Social History Narrative   Raised in [place], [place]. Does not have any religious beliefs that would effect healthcare. Lives in house with sister. Likes to ride motorcycle for fun.    Moving out on his own; in the next couple of weeks will be moving back home that he had rented      Motorcycles; with a helmet   Social Determinants of Health   Financial Resource Strain: Low Risk  (09/17/2022)   Overall Financial Resource Strain (CARDIA)    Difficulty of Paying Living Expenses: Not hard at all  Food Insecurity: No Food Insecurity (09/17/2022)   Hunger Vital Sign    Worried About Running Out of Food in the Last Year: Never true    Ran Out of Food in the Last Year: Never true  Transportation Needs: No Transportation Needs (09/17/2022)   PRAPARE - Administrator, Civil Service (Medical): No    Lack of Transportation (Non-Medical): No  Physical Activity: Insufficiently  Active (09/17/2022)   Exercise Vital Sign    Days of Exercise per Week: 3 days    Minutes of Exercise per Session: 30 min  Stress: Stress Concern Present (09/17/2022)   [employer] of Occupational Health - Occupational Stress Questionnaire    Feeling of Stress : To some extent  Social Connections: Socially Isolated (09/17/2022)   Social Connection and Isolation Panel [NHANES]    Frequency of Communication with Friends and Family: More than three times a week    Frequency of Social Gatherings with Friends and Family: More than three times a week    Attends Religious Services: Never    Database administrator or Organizations: No    Attends Banker Meetings: Never    Marital Status: Never married  Intimate Partner Violence: Not At Risk (09/17/2022)   Humiliation, Afraid, Rape, and Kick questionnaire    Fear of Current or Ex-Partner: No    Emotionally Abused: No    Physically Abused: No    Sexually Abused: No    Review of Systems  Constitutional:  Positive for fatigue.  Psychiatric/Behavioral:  Positive for sleep disturbance.     Vitals:   05/07/23 0957  BP: (!) 142/72  Pulse: 75  Temp: 97.6 F (36.4 C)  SpO2: 97%     Physical Exam Constitutional:      Appearance: He is obese.  HENT:     Head: Normocephalic.     Nose: Nose normal. No congestion.     Mouth/Throat:     Mouth: Mucous membranes are moist.     Comments: Mallampati 4, macroglossia Eyes:     Pupils: Pupils are equal, round, and reactive to light.  Cardiovascular:     Rate and Rhythm: Normal rate and regular rhythm.     Heart sounds: No murmur heard.    No friction rub.  Pulmonary:     Effort: No respiratory distress.     Breath sounds: No stridor. No wheezing or rhonchi.  Musculoskeletal:     Cervical back: No rigidity or tenderness.  Neurological:     Mental Status: He is alert.  Psychiatric:        Mood and Affect: Mood normal.     Data Reviewed: Sleep study reviewed showing severe  obstructive sleep apnea with an AHI of 49, oxygen nadir of 75%  No CPAP compliance available -Has not been able to tolerated  Assessment:  Severe obstructive sleep apnea -Intolerant of CPAP -Scheduled for inspire device implantation  Severe anxiety -Xanax seems to be helping -Will call in prescription for Xanax and increase dose  He is a reformed smoker -Does have a history of emphysema  History of hypertension History of coronary artery disease  Class I obesity  -Plan/Recommendations: For inspire device implantation  For his anxiety we will call in a prescription for Xanax  Encouraged to call with significant concerns  He is cleared to go ahead with his surgery   Virl Diamond MD [facility] 05/08/2023, 9:01 PM  CC: Ardith Dark, MD   a

## 2023-05-07 NOTE — Patient Instructions (Signed)
I will see you back in 3 months  Will place a prescription for higher dose of Xanax You can use a low-dose during the day and 0.5 at night  Good luck with your surgery

## 2023-05-07 NOTE — Progress Notes (Signed)
Patient saw Dr Wynona Neat, pulmonary today but there was no mention of SOB or difficulty breathing noted in assessment. There was a script sent in for Xanax for patient to take. Anesthesia will need a pulmonary clearance from Dr stating he is cleared for [facility] surgery.

## 2023-05-13 ENCOUNTER — Ambulatory Visit (HOSPITAL_BASED_OUTPATIENT_CLINIC_OR_DEPARTMENT_OTHER): Payer: PPO | Admitting: Anesthesiology

## 2023-05-13 ENCOUNTER — Ambulatory Visit (HOSPITAL_BASED_OUTPATIENT_CLINIC_OR_DEPARTMENT_OTHER)
Admission: RE | Admit: 2023-05-13 | Discharge: 2023-05-13 | Disposition: A | Discharge status: Home / Self Care | Payer: PPO | Attending: Otolaryngology | Admitting: Otolaryngology

## 2023-05-13 ENCOUNTER — Encounter (HOSPITAL_BASED_OUTPATIENT_CLINIC_OR_DEPARTMENT_OTHER)
Admission: RE | Disposition: A | Discharge status: Home / Self Care | Payer: Self-pay | Source: Home / Self Care | Attending: Otolaryngology

## 2023-05-13 ENCOUNTER — Ambulatory Visit (HOSPITAL_COMMUNITY): Payer: PPO

## 2023-05-13 ENCOUNTER — Encounter (HOSPITAL_BASED_OUTPATIENT_CLINIC_OR_DEPARTMENT_OTHER): Payer: Self-pay | Admitting: Otolaryngology

## 2023-05-13 ENCOUNTER — Other Ambulatory Visit: Payer: Self-pay

## 2023-05-13 DIAGNOSIS — J449 Chronic obstructive pulmonary disease, unspecified: Secondary | ICD-10-CM | POA: Insufficient documentation

## 2023-05-13 DIAGNOSIS — G4733 Obstructive sleep apnea (adult) (pediatric): Secondary | ICD-10-CM | POA: Diagnosis not present

## 2023-05-13 DIAGNOSIS — G4730 Sleep apnea, unspecified: Secondary | ICD-10-CM | POA: Diagnosis not present

## 2023-05-13 DIAGNOSIS — Z79899 Other long term (current) drug therapy: Secondary | ICD-10-CM | POA: Diagnosis not present

## 2023-05-13 DIAGNOSIS — Z8249 Family history of ischemic heart disease and other diseases of the circulatory system: Secondary | ICD-10-CM | POA: Diagnosis not present

## 2023-05-13 DIAGNOSIS — R918 Other nonspecific abnormal finding of lung field: Secondary | ICD-10-CM | POA: Diagnosis not present

## 2023-05-13 DIAGNOSIS — I739 Peripheral vascular disease, unspecified: Secondary | ICD-10-CM | POA: Insufficient documentation

## 2023-05-13 DIAGNOSIS — I10 Essential (primary) hypertension: Secondary | ICD-10-CM | POA: Insufficient documentation

## 2023-05-13 DIAGNOSIS — Z6832 Body mass index (BMI) 32.0-32.9, adult: Secondary | ICD-10-CM | POA: Diagnosis not present

## 2023-05-13 DIAGNOSIS — E669 Obesity, unspecified: Secondary | ICD-10-CM | POA: Diagnosis not present

## 2023-05-13 DIAGNOSIS — Z87891 Personal history of nicotine dependence: Secondary | ICD-10-CM | POA: Diagnosis not present

## 2023-05-13 DIAGNOSIS — K219 Gastro-esophageal reflux disease without esophagitis: Secondary | ICD-10-CM | POA: Diagnosis not present

## 2023-05-13 DIAGNOSIS — I2510 Atherosclerotic heart disease of native coronary artery without angina pectoris: Secondary | ICD-10-CM | POA: Diagnosis not present

## 2023-05-13 DIAGNOSIS — Z01818 Encounter for other preprocedural examination: Secondary | ICD-10-CM

## 2023-05-13 DIAGNOSIS — F418 Other specified anxiety disorders: Secondary | ICD-10-CM | POA: Diagnosis not present

## 2023-05-13 DIAGNOSIS — R0989 Other specified symptoms and signs involving the circulatory and respiratory systems: Secondary | ICD-10-CM | POA: Diagnosis not present

## 2023-05-13 DIAGNOSIS — J984 Other disorders of lung: Secondary | ICD-10-CM | POA: Diagnosis not present

## 2023-05-13 HISTORY — PX: IMPLANTATION OF HYPOGLOSSAL NERVE STIMULATOR: SHX6827

## 2023-05-13 SURGERY — INSERTION, HYPOGLOSSAL NERVE STIMULATOR
Anesthesia: General | Site: Neck | Laterality: Right

## 2023-05-13 MED ORDER — FENTANYL CITRATE (PF) 100 MCG/2ML IJ SOLN
INTRAMUSCULAR | Status: AC
  Filled 2023-05-13: qty 2

## 2023-05-13 MED ORDER — PHENYLEPHRINE HCL (PRESSORS) 10 MG/ML IV SOLN
INTRAVENOUS | Status: AC
  Filled 2023-05-13: qty 1

## 2023-05-13 MED ORDER — FENTANYL CITRATE (PF) 100 MCG/2ML IJ SOLN
25.0000 ug | INTRAMUSCULAR | Status: DC | PRN
  Administered 2023-05-13 (×3): 50 ug via INTRAVENOUS

## 2023-05-13 MED ORDER — 0.9 % SODIUM CHLORIDE (POUR BTL) OPTIME
TOPICAL | Status: DC | PRN
  Administered 2023-05-13: 50 mL

## 2023-05-13 MED ORDER — ACETAMINOPHEN 10 MG/ML IV SOLN
1000.0000 mg | Freq: Once | INTRAVENOUS | Status: DC | PRN
  Administered 2023-05-13: 1000 mg via INTRAVENOUS

## 2023-05-13 MED ORDER — CEFAZOLIN SODIUM-DEXTROSE 2-4 GM/100ML-% IV SOLN
2.0000 g | INTRAVENOUS | Status: AC
  Administered 2023-05-13: 2 g via INTRAVENOUS

## 2023-05-13 MED ORDER — FENTANYL CITRATE (PF) 100 MCG/2ML IJ SOLN
INTRAMUSCULAR | Status: DC | PRN
  Administered 2023-05-13: 100 ug via INTRAVENOUS

## 2023-05-13 MED ORDER — PROPOFOL 10 MG/ML IV BOLUS
INTRAVENOUS | Status: DC | PRN
  Administered 2023-05-13: 200 mg via INTRAVENOUS

## 2023-05-13 MED ORDER — PROPOFOL 500 MG/50ML IV EMUL
INTRAVENOUS | Status: DC | PRN
  Administered 2023-05-13: 100 ug/kg/min via INTRAVENOUS

## 2023-05-13 MED ORDER — SUCCINYLCHOLINE CHLORIDE 200 MG/10ML IV SOSY
PREFILLED_SYRINGE | INTRAVENOUS | Status: DC | PRN
  Administered 2023-05-13: 140 mg via INTRAVENOUS

## 2023-05-13 MED ORDER — DEXAMETHASONE SODIUM PHOSPHATE 4 MG/ML IJ SOLN
INTRAMUSCULAR | Status: DC | PRN
  Administered 2023-05-13: 10 mg via INTRAVENOUS

## 2023-05-13 MED ORDER — PHENYLEPHRINE HCL (PRESSORS) 10 MG/ML IV SOLN
INTRAVENOUS | Status: DC | PRN
Start: 2023-05-13 — End: 2023-05-13
  Administered 2023-05-13 (×2): 160 ug via INTRAVENOUS

## 2023-05-13 MED ORDER — EPHEDRINE SULFATE (PRESSORS) 50 MG/ML IJ SOLN
INTRAMUSCULAR | Status: DC | PRN
  Administered 2023-05-13: 10 mg via INTRAVENOUS

## 2023-05-13 MED ORDER — HYDROCODONE-ACETAMINOPHEN 5-325 MG PO TABS: 1.0000 | ORAL_TABLET | Freq: Four times a day (QID) | ORAL | 0 refills | Status: DC | PRN

## 2023-05-13 MED ORDER — PROPOFOL 10 MG/ML IV BOLUS
INTRAVENOUS | Status: AC
  Filled 2023-05-13: qty 20

## 2023-05-13 MED ORDER — LIDOCAINE-EPINEPHRINE 1 %-1:100000 IJ SOLN
INTRAMUSCULAR | Status: AC
  Filled 2023-05-13: qty 1

## 2023-05-13 MED ORDER — ROCURONIUM BROMIDE 10 MG/ML (PF) SYRINGE
PREFILLED_SYRINGE | INTRAVENOUS | Status: AC
  Filled 2023-05-13: qty 10

## 2023-05-13 MED ORDER — ONDANSETRON HCL 4 MG/2ML IJ SOLN
INTRAMUSCULAR | Status: AC
  Filled 2023-05-13: qty 2

## 2023-05-13 MED ORDER — LIDOCAINE-EPINEPHRINE 1 %-1:100000 IJ SOLN
INTRAMUSCULAR | Status: DC | PRN
  Administered 2023-05-13: 5 mL

## 2023-05-13 MED ORDER — PHENYLEPHRINE HCL-NACL 20-0.9 MG/250ML-% IV SOLN
INTRAVENOUS | Status: DC | PRN
Start: 2023-05-13 — End: 2023-05-13
  Administered 2023-05-13: 25 ug/min via INTRAVENOUS

## 2023-05-13 MED ORDER — CEFAZOLIN SODIUM-DEXTROSE 2-4 GM/100ML-% IV SOLN
INTRAVENOUS | Status: AC
  Filled 2023-05-13: qty 100

## 2023-05-13 MED ORDER — LACTATED RINGERS IV SOLN: INTRAVENOUS | Status: DC

## 2023-05-13 MED ORDER — LIDOCAINE HCL (CARDIAC) PF 100 MG/5ML IV SOSY
PREFILLED_SYRINGE | INTRAVENOUS | Status: DC | PRN
  Administered 2023-05-13: 100 mg via INTRAVENOUS

## 2023-05-13 MED ORDER — DEXAMETHASONE SODIUM PHOSPHATE 10 MG/ML IJ SOLN
INTRAMUSCULAR | Status: AC
  Filled 2023-05-13: qty 1

## 2023-05-13 MED ORDER — ACETAMINOPHEN 10 MG/ML IV SOLN
INTRAVENOUS | Status: AC
  Filled 2023-05-13: qty 100

## 2023-05-13 SURGICAL SUPPLY — 70 items
ACC NRSTM 4 TRQ WRNCH STRL (MISCELLANEOUS)
ADH SKN CLS APL DERMABOND .7 (GAUZE/BANDAGES/DRESSINGS) ×2
BLADE CLIPPER SURG (BLADE) IMPLANT
BLADE SURG 15 STRL LF DISP TIS (BLADE) ×1 IMPLANT
BLADE SURG 15 STRL SS (BLADE) ×1
CANISTER SUCT 1200ML W/VALVE (MISCELLANEOUS) ×1 IMPLANT
CORD BIPOLAR FORCEPS 12FT (ELECTRODE) ×1 IMPLANT
COVER PROBE CYLINDRICAL 5X96 (MISCELLANEOUS) ×1 IMPLANT
DERMABOND ADVANCED .7 DNX12 (GAUZE/BANDAGES/DRESSINGS) ×2 IMPLANT
DRAPE C-ARM 35X43 STRL (DRAPES) ×1 IMPLANT
DRAPE HEAD BAR (DRAPES) IMPLANT
DRAPE INCISE IOBAN 66X45 STRL (DRAPES) ×1 IMPLANT
DRAPE MICROSCOPE WILD 40.5X102 (DRAPES) ×1 IMPLANT
DRAPE UTILITY XL STRL (DRAPES) ×1 IMPLANT
DRSG TEGADERM 2-3/8X2-3/4 SM (GAUZE/BANDAGES/DRESSINGS) ×2 IMPLANT
DRSG TEGADERM 4X4.75 (GAUZE/BANDAGES/DRESSINGS) IMPLANT
ELECT COATED BLADE 2.86 ST (ELECTRODE) ×1 IMPLANT
ELECT EMG 18 NIMS (NEUROSURGERY SUPPLIES) ×1
ELECT REM PT RETURN 9FT ADLT (ELECTROSURGICAL) ×1
ELECTRODE EMG 18 NIMS (NEUROSURGERY SUPPLIES) ×1 IMPLANT
ELECTRODE REM PT RTRN 9FT ADLT (ELECTROSURGICAL) ×1 IMPLANT
FORCEPS BIPOLAR SPETZLER 8 1.0 (NEUROSURGERY SUPPLIES) ×1 IMPLANT
GAUZE 4X4 16PLY ~~LOC~~+RFID DBL (SPONGE) ×1 IMPLANT
GAUZE SPONGE 4X4 12PLY STRL (GAUZE/BANDAGES/DRESSINGS) ×1 IMPLANT
GENERATOR PULSE INSPIRE (Generator) ×1 IMPLANT
GENERATOR PULSE INSPIRE IV (Generator) ×1 IMPLANT
GLOVE BIO SURGEON STRL SZ 6.5 (GLOVE) IMPLANT
GLOVE BIO SURGEON STRL SZ7.5 (GLOVE) ×1 IMPLANT
GLOVE BIOGEL PI IND STRL 7.0 (GLOVE) IMPLANT
GLOVE SURG SS PI 7.0 STRL IVOR (GLOVE) IMPLANT
GOWN STRL REUS W/ TWL LRG LVL3 (GOWN DISPOSABLE) ×3 IMPLANT
GOWN STRL REUS W/TWL LRG LVL3 (GOWN DISPOSABLE) ×4
IV CATH 18G SAFETY (IV SOLUTION) ×1 IMPLANT
KIT NEURO ACCESSORY W/WRENCH (MISCELLANEOUS) IMPLANT
LEAD SENSING RESP INSPIRE (Lead) ×1 IMPLANT
LEAD SENSING RESP INSPIRE IV (Lead) ×1 IMPLANT
LEAD SLEEP STIM INSPIRE IV/V (Lead) ×1 IMPLANT
LEAD SLEEP STIMULATION INSPIRE (Lead) ×1 IMPLANT
LOOP VASCLR MAXI BLUE 18IN ST (MISCELLANEOUS) ×1 IMPLANT
LOOP VASCULAR MAXI 18 BLUE (MISCELLANEOUS) ×1
LOOP VASCULAR MINI 18 RED (MISCELLANEOUS) ×1
LOOPS VASCLR MAXI BLUE 18IN ST (MISCELLANEOUS) ×1 IMPLANT
MARKER SKIN DUAL TIP RULER LAB (MISCELLANEOUS) ×1 IMPLANT
NDL HYPO 25X1 1.5 SAFETY (NEEDLE) ×1 IMPLANT
NEEDLE HYPO 25X1 1.5 SAFETY (NEEDLE) ×1 IMPLANT
NS IRRIG 1000ML POUR BTL (IV SOLUTION) ×1 IMPLANT
PACK BASIN DAY SURGERY FS (CUSTOM PROCEDURE TRAY) ×1 IMPLANT
PACK ENT DAY SURGERY (CUSTOM PROCEDURE TRAY) ×1 IMPLANT
PASSER CATH 36 CODMAN DISP (NEUROSURGERY SUPPLIES) IMPLANT
PASSER CATH 38CM DISP (INSTRUMENTS) IMPLANT
PENCIL SMOKE EVACUATOR (MISCELLANEOUS) ×1 IMPLANT
PROBE NERVE STIMULATOR (NEUROSURGERY SUPPLIES) ×1 IMPLANT
REMOTE CONTROL SLEEP INSPIRE (MISCELLANEOUS) ×1 IMPLANT
SET WALTER ACTIVATION W/DRAPE (SET/KITS/TRAYS/PACK) ×1 IMPLANT
SLEEVE SCD COMPRESS KNEE MED (STOCKING) ×1 IMPLANT
SPONGE INTESTINAL PEANUT (DISPOSABLE) ×1 IMPLANT
SUT SILK 2 0 SH (SUTURE) ×1 IMPLANT
SUT SILK 3 0 REEL (SUTURE) ×1 IMPLANT
SUT SILK 3 0 SH 30 (SUTURE) ×1 IMPLANT
SUT SILK 3-0 (SUTURE) ×1
SUT SILK 3-0 RB1 30XBRD (SUTURE) ×1
SUT VIC AB 3-0 SH 27 (SUTURE) ×2
SUT VIC AB 3-0 SH 27X BRD (SUTURE) ×2 IMPLANT
SUT VIC AB 4-0 PS2 27 (SUTURE) ×2 IMPLANT
SUTURE SILK 3-0 RB1 30XBRD (SUTURE) ×1 IMPLANT
SYR 10ML LL (SYRINGE) ×1 IMPLANT
SYR BULB EAR ULCER 3OZ GRN STR (SYRINGE) ×1 IMPLANT
TOWEL GREEN STERILE FF (TOWEL DISPOSABLE) ×2 IMPLANT
VASCULAR TIE MAXI BLUE 18IN ST (MISCELLANEOUS) ×1
VASCULAR TIE MINI RED 18IN STL (MISCELLANEOUS) ×1 IMPLANT

## 2023-05-13 NOTE — Discharge Instructions (Addendum)
  Post Anesthesia Home Care Instructions  Activity: Get plenty of rest for the remainder of the day. A responsible individual must stay with you for 24 hours following the procedure.  For the next 24 hours, DO NOT: -Drive a car -Advertising copywriter -Drink alcoholic beverages -Take any medication unless instructed by your physician -Make any legal decisions or sign important papers.  Meals: Start with liquid foods such as gelatin or soup. Progress to regular foods as tolerated. Avoid greasy, spicy, heavy foods. If nausea and/or vomiting occur, drink only clear liquids until the nausea and/or vomiting subsides. Call your physician if vomiting continues.  Special Instructions/Symptoms: Your throat may feel dry or sore from the anesthesia or the breathing tube placed in your throat during surgery. If this causes discomfort, gargle with warm salt water. The discomfort should disappear within 24 hours.  If you had a scopolamine patch placed behind your ear for the management of post- operative nausea and/or vomiting:  1. The medication in the patch is effective for 72 hours, after which it should be removed.  Wrap patch in a tissue and discard in the trash. Wash hands thoroughly with soap and water. 2. You may remove the patch earlier than 72 hours if you experience unpleasant side effects which may include dry mouth, dizziness or visual disturbances. 3. Avoid touching the patch. Wash your hands with soap and water after contact with the patch.    Last dose of tylenol given at 545pm Resume plavis Wednesday 10/2 per Dr Jenne Pane

## 2023-05-13 NOTE — Brief Op Note (Signed)
05/13/2023  2:57 PM  PATIENT:  Todd Weeks  67 y.o. male  PRE-OPERATIVE DIAGNOSIS:  obstructive sleep apnea  POST-OPERATIVE DIAGNOSIS:  obstructive sleep apnea  PROCEDURE:  Procedure(s): IMPLANTATION OF HYPOGLOSSAL NERVE STIMULATOR (Right)  SURGEON:  Surgeons and Role:    Christia Reading, MD - Primary  PHYSICIAN ASSISTANT: Clovis Cao  ASSISTANTS: none   ANESTHESIA:   general  EBL:  30 mL   BLOOD ADMINISTERED:none  DRAINS: none   LOCAL MEDICATIONS USED:  LIDOCAINE   SPECIMEN:  No Specimen  DISPOSITION OF SPECIMEN:  N/A  COUNTS:  YES  TOURNIQUET:  * No tourniquets in log *  DICTATION: .Note written in EPIC  PLAN OF CARE: Discharge to home after PACU  PATIENT DISPOSITION:  PACU - hemodynamically stable.   Delay start of Pharmacological VTE agent (>24hrs) due to surgical blood loss or risk of bleeding: no

## 2023-05-13 NOTE — Anesthesia Preprocedure Evaluation (Addendum)
Anesthesia Evaluation  Patient identified by MRN, date of birth, ID band Patient awake    Reviewed: Allergy & Precautions, NPO status , Patient's Chart, lab work & pertinent test results  Airway Mallampati: IV  TM Distance: >3 FB Neck ROM: Full    Dental no notable dental hx.    Pulmonary sleep apnea , COPD, former smoker   Pulmonary exam normal        Cardiovascular hypertension, + CAD, + Peripheral Vascular Disease and + DOE   Rhythm:Regular Rate:Normal     Neuro/Psych  Headaches  Anxiety Depression       GI/Hepatic Neg liver ROS,GERD  Medicated,,  Endo/Other    Renal/GU negative Renal ROS  negative genitourinary   Musculoskeletal negative musculoskeletal ROS (+)    Abdominal Normal abdominal exam  (+)   Peds  Hematology Lab Results      Component                Value               Date                      WBC                      7.8                 04/23/2022                HGB                      14.3                04/23/2022                HCT                      42.4                04/23/2022                MCV                      90.7                04/23/2022                PLT                      241.0               04/23/2022              Anesthesia Other Findings   Reproductive/Obstetrics                             Anesthesia Physical Anesthesia Plan  ASA: 3  Anesthesia Plan: General   Post-op Pain Management:    Induction: Intravenous  PONV Risk Score and Plan: 2 and Ondansetron, Dexamethasone and Treatment may vary due to age or medical condition  Airway Management Planned: Mask, Oral ETT and Video Laryngoscope Planned  Additional Equipment: None  Intra-op Plan:   Post-operative Plan: Extubation in OR  Informed Consent: I have reviewed the patients History and Physical, chart, labs and discussed the procedure including the risks, benefits and  alternatives for the proposed anesthesia  with the patient or authorized representative who has indicated his/her understanding and acceptance.     Dental advisory given  Plan Discussed with: CRNA  Anesthesia Plan Comments:        Anesthesia Quick Evaluation

## 2023-05-13 NOTE — Transfer of Care (Signed)
Immediate Anesthesia Transfer of Care Note  Patient: Todd Weeks  Procedure(s) Performed: IMPLANTATION OF HYPOGLOSSAL NERVE STIMULATOR (Right: Neck)  Patient Location: PACU  Anesthesia Type:General  Level of Consciousness: awake  Airway & Oxygen Therapy: Patient connected to face mask oxygen  Post-op Assessment: Report given to RN and Post -op Vital signs reviewed and stable  Post vital signs: Reviewed and stable  Last Vitals:  Vitals Value Taken Time  BP 142/87 05/13/23 1515  Temp 36.1 C 05/13/23 1515  Pulse 77 05/13/23 1528  Resp 11 05/13/23 1528  SpO2 97 % 05/13/23 1528  Vitals shown include unfiled device data.  Last Pain:  Vitals:   05/13/23 1510  TempSrc:   PainSc: Asleep         Complications: No notable events documented.

## 2023-05-13 NOTE — Op Note (Signed)

## 2023-05-13 NOTE — Progress Notes (Signed)
Dr Ace Gins called for pt oxygen saturation dipping to 88 on room air. Patient denies shortness of breath, oxygen saturation improves when deep breaths are encouraged. Pt provided with incentive spirometer. Patient moved to recliner to see if ambulating and sitting upright helps improve. No new orders at this time.

## 2023-05-13 NOTE — Anesthesia Postprocedure Evaluation (Signed)
Anesthesia Post Note  Patient: Todd Weeks  Procedure(s) Performed: IMPLANTATION OF HYPOGLOSSAL NERVE STIMULATOR (Right: Neck)     Patient location during evaluation: PACU Anesthesia Type: General Level of consciousness: awake and alert Pain management: pain level controlled Vital Signs Assessment: post-procedure vital signs reviewed and stable Respiratory status: spontaneous breathing, nonlabored ventilation, respiratory function stable and patient connected to nasal cannula oxygen Cardiovascular status: blood pressure returned to baseline and stable Postop Assessment: no apparent nausea or vomiting Anesthetic complications: no   No notable events documented.  Last Vitals:  Vitals:   05/13/23 1630 05/13/23 1645  BP: (!) 154/91 (!) 146/92  Pulse: 72 75  Resp: 13 12  Temp:    SpO2: 93% 94%    Last Pain:  Vitals:   05/13/23 1645  TempSrc:   PainSc: 3                  Aadin Gaut P Yaneth Fairbairn

## 2023-05-13 NOTE — H&P (Signed)
Todd Weeks is an 67 y.o. male.   Chief Complaint: Sleep apnea HPI: 67 year old male with sleep apnea who has not been able to tolerate CPAP.  Past Medical History:  Diagnosis Date   Bell's palsy    Cataract    Mixed form OU   Closed nondisplaced fracture of neck of third metacarpal bone of right hand 02/04/2017   Clotting disorder (HCC)    COPD (chronic obstructive pulmonary disease) (HCC)    Depression    Diverticulitis    GERD (gastroesophageal reflux disease)    Headache(784.0)    HTN (hypertension)    Hyperlipidemia    Hypertensive retinopathy    OU   Mild CAD 2012   PVD (peripheral vascular disease) (HCC)    Retinal detachment    Sexual dysfunction    Sleep apnea     Past Surgical History:  Procedure Laterality Date   ABDOMINAL AORTOGRAM W/LOWER EXTREMITY Bilateral 02/05/2020   Procedure: ABDOMINAL AORTOGRAM W/LOWER EXTREMITY;  Surgeon: Sherren Kerns, MD;  Location: [facility];  Service: Cardiovascular;  Laterality: Bilateral;   ABDOMINAL AORTOGRAM W/LOWER EXTREMITY N/A 12/16/2020   Procedure: ABDOMINAL AORTOGRAM W/LOWER EXTREMITY;  Surgeon: Sherren Kerns, MD;  Location: [facility];  Service: Cardiovascular;  Laterality: N/A;   CATARACT EXTRACTION     DRUG INDUCED ENDOSCOPY Right 01/15/2023   Procedure: DRUG INDUCED SLEEP  ENDOSCOPY;  Surgeon: Christia Reading, MD;  Location: [facility];  Service: ENT;  Laterality: Right;   EYE SURGERY Right 05/06/2020   Pneumatic retinopexy for rheg. RD repair - Dr. Rennis Chris   EYE SURGERY Right 05/26/2020   PPV/MP - Dr. Rennis Chris   GAS INSERTION Right 05/26/2020   Procedure: INSERTION OF GAS;  Surgeon: Rennis Chris, MD;  Location: [facility] OR;  Service: Ophthalmology;  Laterality: Right;   INGUINAL HERNIA REPAIR Right 05/29/2021   Procedure: RIGHT INGUINAL HERNIA REPAIR WITH MESH;  Surgeon: Griselda Miner, MD;  Location: [facility];  Service: General;  Laterality: Right;  TAP BLOCK    LEFT HEART CATH AND CORONARY ANGIOGRAPHY N/A 04/05/2021   Procedure: LEFT HEART CATH AND CORONARY ANGIOGRAPHY;  Surgeon: Kathleene Hazel, MD;  Location: [facility];  Service: Cardiovascular;  Laterality: N/A;   MEMBRANE PEEL Right 05/26/2020   Procedure: MEMBRANE PEEL;  Surgeon: Rennis Chris, MD;  Location: [facility] OR;  Service: Ophthalmology;  Laterality: Right;   PARS PLANA VITRECTOMY Right 05/26/2020   Procedure: PARS PLANA VITRECTOMY WITH 25 GAUGE;  Surgeon: Rennis Chris, MD;  Location: [facility] OR;  Service: Ophthalmology;  Laterality: Right;   PERIPHERAL VASCULAR INTERVENTION Right 12/16/2020   Procedure: PERIPHERAL VASCULAR INTERVENTION;  Surgeon: Sherren Kerns, MD;  Location: [facility];  Service: Cardiovascular;  Laterality: Right;  SFA (distal)   PHOTOCOAGULATION Right 05/26/2020   Procedure: PHOTOCOAGULATION;  Surgeon: Rennis Chris, MD;  Location: [facility] OR;  Service: Ophthalmology;  Laterality: Right;   PROSTATE BIOPSY  2015   RETINAL DETACHMENT SURGERY Right 05/06/2020   Pneumatic retinopexy for rheg RD repair - Dr. Rennis Chris   RETINAL DETACHMENT SURGERY Right 05/26/2020   PPV/MP - Dr. Rennis Chris    Family History  Problem Relation Age of Onset   Hypertension Mother    Deep vein thrombosis Father    Diabetes Brother    Tuberculosis Maternal Grandfather    Emphysema Maternal Grandfather    Lung disease Brother    Asthma Son    Rectal  cancer Neg Hx    Colon cancer Neg Hx    Social History:  reports that he quit smoking about 12 years ago. His smoking use included cigarettes. He started smoking about 58 years ago. He has a 69 pack-year smoking history. He has never used smokeless tobacco. He reports current alcohol use of about 4.0 standard drinks of alcohol per week. He reports that he does not currently use drugs after having used the following drugs: Cocaine, Marijuana, and "Crack" cocaine.  Allergies: No Known Allergies  Medications Prior to  Admission  Medication Sig Dispense Refill   ALPRAZolam (XANAX) 0.25 MG tablet Take 2 tablets (0.5 mg total) by mouth 2 (two) times daily as needed for anxiety. 90 tablet 1   aspirin EC 81 MG tablet Take 1 tablet (81 mg total) by mouth daily. 150 tablet 2   atorvastatin (LIPITOR) 40 MG tablet Take 1 tablet (40 mg total) by mouth daily. 90 tablet 3   clopidogrel (PLAVIX) 75 MG tablet Take 1 tablet (75 mg total) by mouth daily. 90 tablet 3   diclofenac Sodium (VOLTAREN) 1 % GEL Apply 1 application topically 3 (three) times daily as needed (leg cramps/spasms).     famotidine (PEPCID) 20 MG tablet Take 1 tablet (20 mg total) by mouth daily. 90 tablet 3   FLUoxetine (PROZAC) 40 MG capsule TAKE (1) CAPSULE BY MOUTH DAILY 90 capsule 3   gabapentin (NEURONTIN) 100 MG capsule Take 1-3 capsules (100-300 mg total) by mouth at bedtime as needed. 90 capsule 1   irbesartan (AVAPRO) 300 MG tablet Take 1 tablet (300 mg total) by mouth daily. 90 tablet 3   pantoprazole (PROTONIX) 40 MG tablet Take 1 tablet (40 mg total) by mouth daily. Take 30-60 min before first meal of the day 30 tablet 2   tamsulosin (FLOMAX) 0.4 MG CAPS capsule Take 1 capsule (0.4 mg total) by mouth daily. 90 capsule 3   nitroGLYCERIN (NITROSTAT) 0.4 MG SL tablet Place 0.4 mg under the tongue every 5 (five) minutes as needed for chest pain.      No results found for this or any previous visit (from the past 48 hour(s)). No results found.  Review of Systems  All other systems reviewed and are negative.   Blood pressure (!) 163/88, pulse 85, temperature 97.9 F (36.6 C), temperature source Temporal, resp. rate 18, height 6' (1.829 m), weight 107.8 kg, SpO2 98%. Physical Exam Constitutional:      Appearance: Normal appearance. He is normal weight.  HENT:     Head: Normocephalic and atraumatic.     Right Ear: External ear normal.     Left Ear: External ear normal.     Nose: Nose normal.     Mouth/Throat:     Mouth: Mucous membranes  are moist.     Pharynx: Oropharynx is clear.  Eyes:     Extraocular Movements: Extraocular movements intact.     Conjunctiva/sclera: Conjunctivae normal.     Pupils: Pupils are equal, round, and reactive to light.  Cardiovascular:     Rate and Rhythm: Normal rate.  Pulmonary:     Effort: Pulmonary effort is normal.  Musculoskeletal:     Cervical back: Normal range of motion.  Skin:    General: Skin is warm and dry.  Neurological:     General: No focal deficit present.     Mental Status: He is alert and oriented to person, place, and time.  Psychiatric:        Mood  and Affect: Mood normal.        Behavior: Behavior normal.        Thought Content: Thought content normal.        Judgment: Judgment normal.      Assessment/Plan Obstructive sleep apnea and BMI 32.33.  To OR for hypoglossal nerve stimulator placement.  Christia Reading, MD 05/13/2023, 1:10 PM

## 2023-05-13 NOTE — Progress Notes (Signed)
Dr. Ace Gins called at approx 1730 regarding patient oxygen saturation. Patient dips down to 88 on room air but comes back up to 90-93 with deep breathing. Patient comes up to 94 with incentive spirometer. No new orders, ok to DC per Dr Ace Gins. Patient educated to use incentive spirometer at home, per patient's sister oximeter is available to use at home.

## 2023-05-13 NOTE — Progress Notes (Signed)
Katie RN called Dr Jenne Pane at approx 1745 regarding xray. This RN spoke with Dr Jenne Pane on phone regarding pain prescription that was unable to get picked up due to pharmacy being closed. Per Dr Jenne Pane pt to use ibuprofen and tylenol overnight and pick up pain script in morning. Clarified when to resume plavix, per Dr Jenne Pane pt to resume plavix on Wednesday 10/2.

## 2023-05-13 NOTE — Anesthesia Procedure Notes (Signed)
Procedure Name: Intubation Date/Time: 05/13/2023 1:54 PM  Performed by: Karen Kitchens, CRNAPre-anesthesia Checklist: Patient identified, Emergency Drugs available, Suction available and Patient being monitored Patient Re-evaluated:Patient Re-evaluated prior to induction Oxygen Delivery Method: Circle system utilized Preoxygenation: Pre-oxygenation with 100% oxygen Induction Type: IV induction Ventilation: Mask ventilation without difficulty Laryngoscope Size: Mac and 4 Grade View: Grade I Tube type: Oral Tube size: 7.5 mm Number of attempts: 1 Airway Equipment and Method: Stylet and Oral airway Placement Confirmation: ETT inserted through vocal cords under direct vision, positive ETCO2, breath sounds checked- equal and bilateral and CO2 detector Secured at: 23 cm Tube secured with: Tape Dental Injury: Teeth and Oropharynx as per pre-operative assessment

## 2023-05-14 ENCOUNTER — Encounter (HOSPITAL_BASED_OUTPATIENT_CLINIC_OR_DEPARTMENT_OTHER): Payer: Self-pay | Admitting: Otolaryngology

## 2023-05-21 DIAGNOSIS — Z48813 Encounter for surgical aftercare following surgery on the respiratory system: Secondary | ICD-10-CM | POA: Diagnosis not present

## 2023-05-21 DIAGNOSIS — M7981 Nontraumatic hematoma of soft tissue: Secondary | ICD-10-CM | POA: Diagnosis not present

## 2023-05-24 IMAGING — DX DG CHEST 2V
2 series · 2 of 2 positions shown · non-contrast
Comparison: 04/25/2018

CLINICAL DATA: Shortness of breath

EXAM:
CHEST - 2 VIEW

[chest pa]
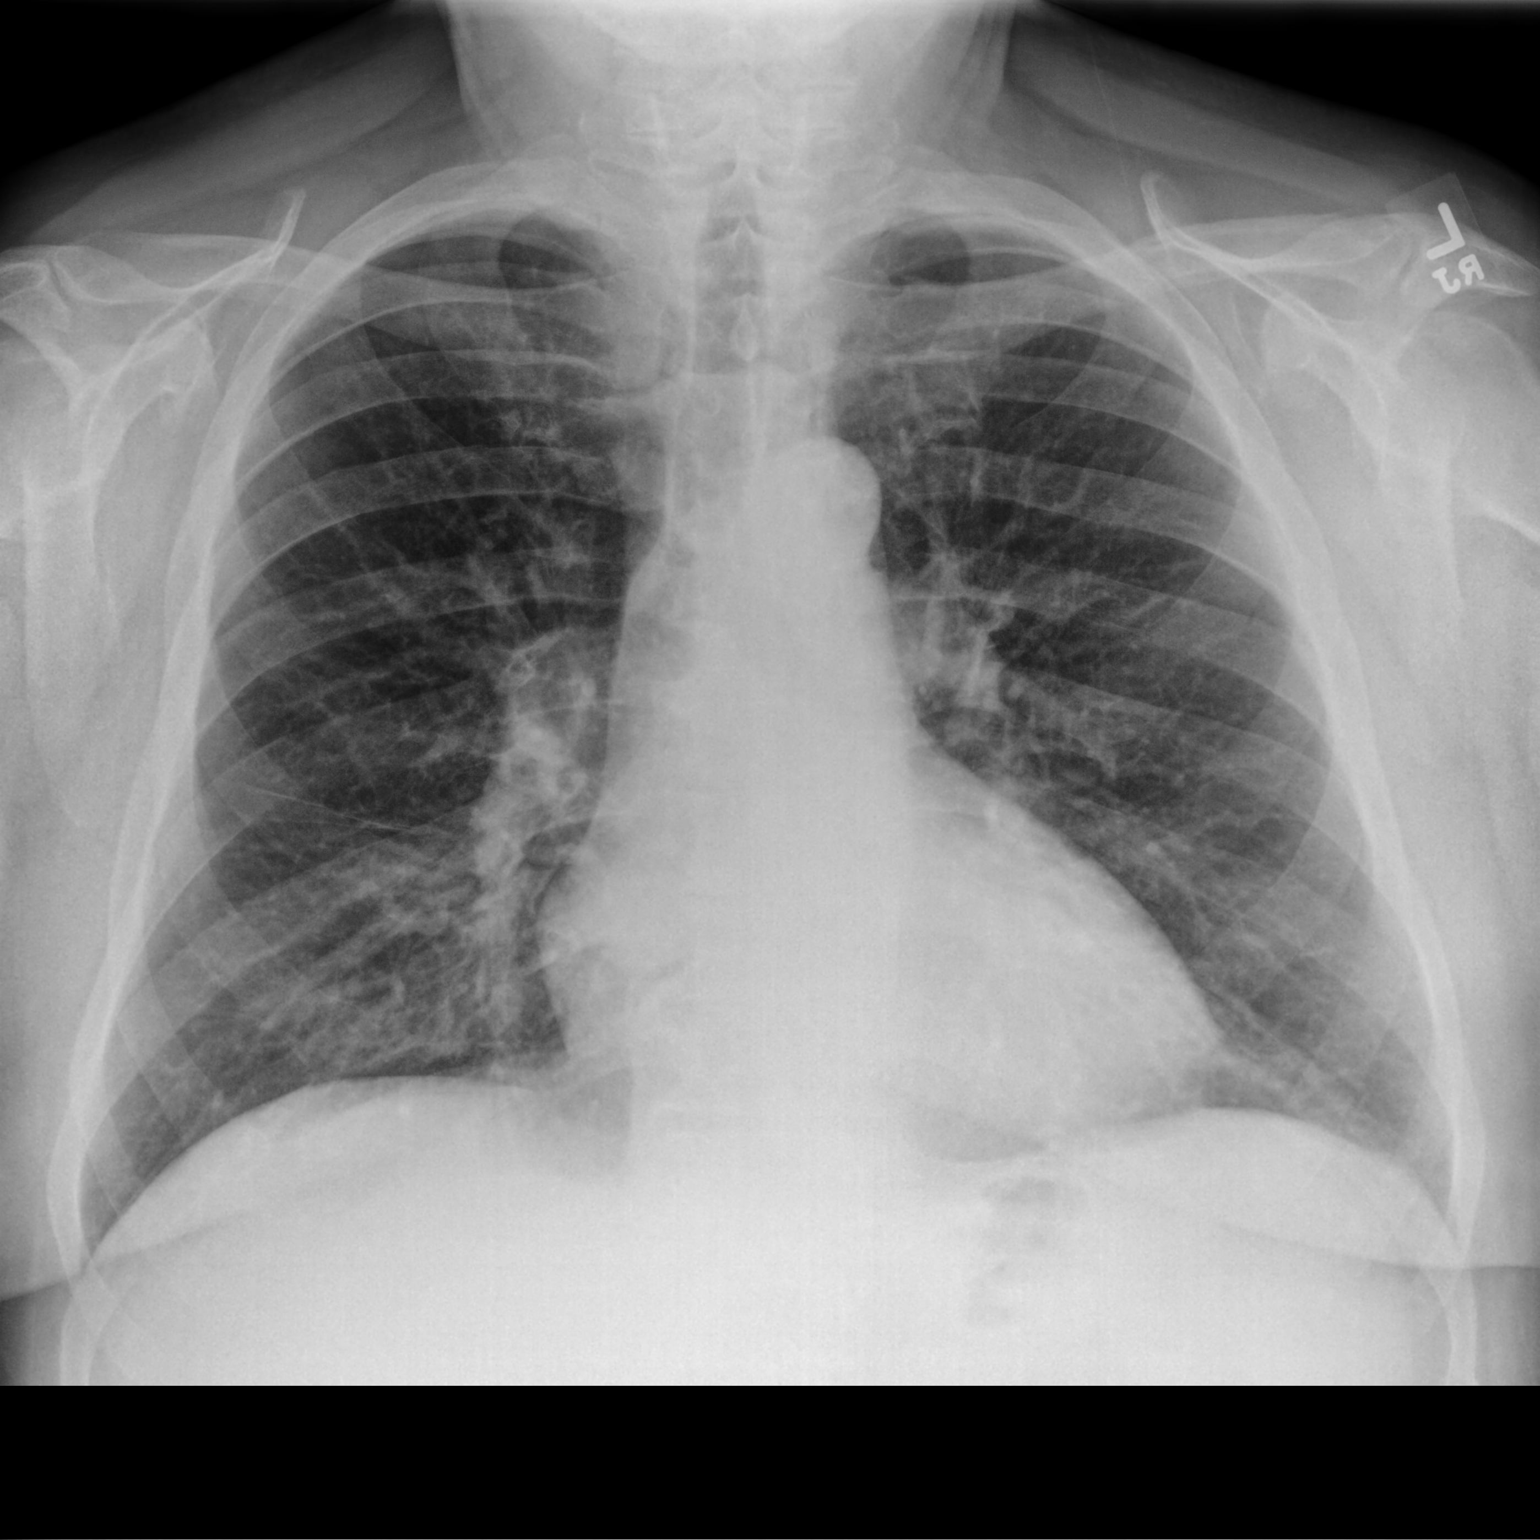

[chest lat]
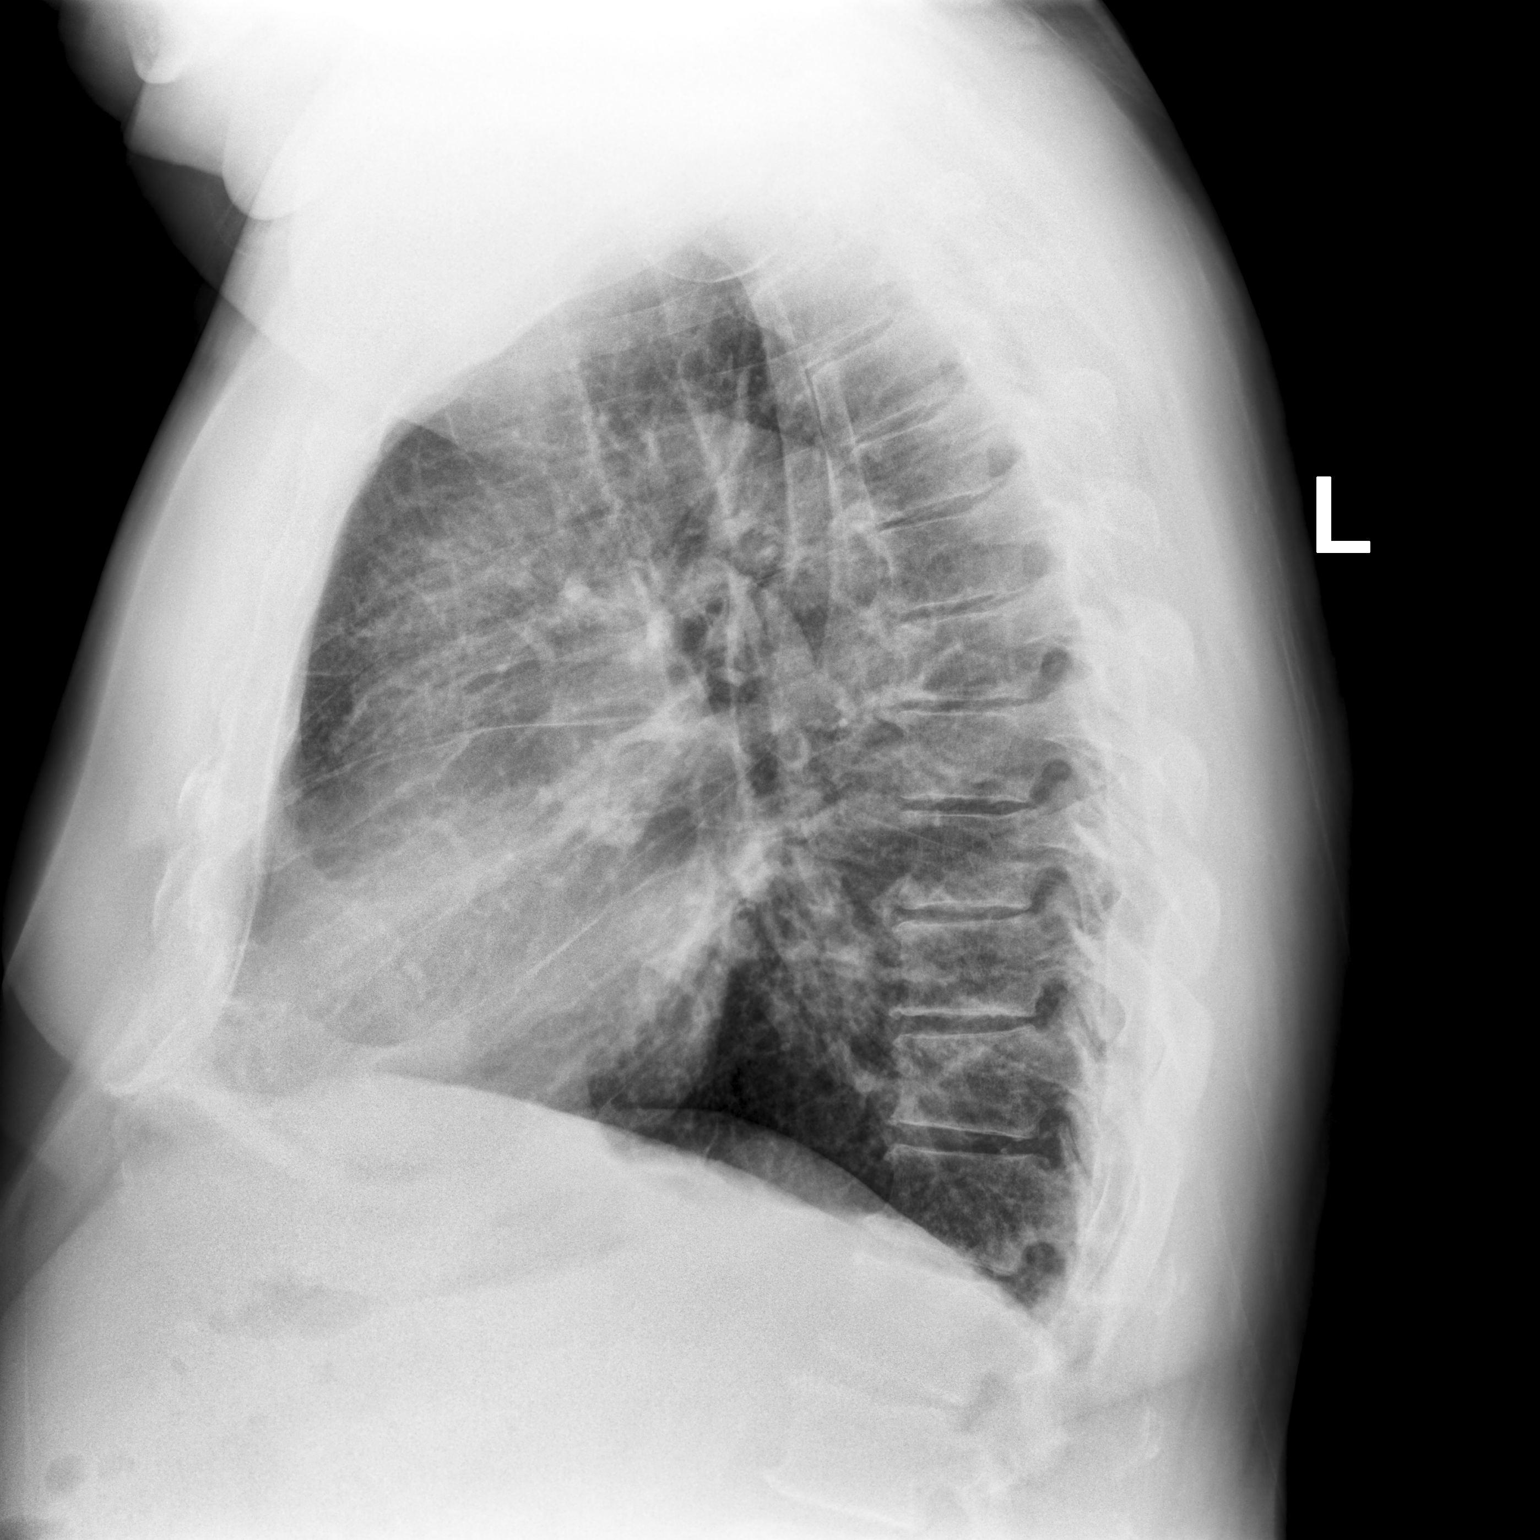

[2 of 2 positions shown; findings below may reference images not displayed]

FINDINGS: Heart size is normal. Mediastinal shadows are normal. Mild
interstitial prominence and possibly a tiny amount of fluid in the
fissures, raising the possibility early interstitial edema. No
consolidation, collapse or measurable effusion.
IMPRESSION: Question pulmonary venous hypertension/early interstitial edema.
Mild interstitial prominence with tiny amount of fluid in the
fissures.

## 2023-06-04 ENCOUNTER — Other Ambulatory Visit: Payer: Self-pay | Admitting: *Deleted

## 2023-06-04 DIAGNOSIS — I2510 Atherosclerotic heart disease of native coronary artery without angina pectoris: Secondary | ICD-10-CM

## 2023-06-04 DIAGNOSIS — R4589 Other symptoms and signs involving emotional state: Secondary | ICD-10-CM

## 2023-06-04 MED ORDER — CLOPIDOGREL BISULFATE 75 MG PO TABS: 75.0000 mg | ORAL_TABLET | Freq: Every day | ORAL | 3 refills | Status: DC

## 2023-06-04 MED ORDER — FLUOXETINE HCL 40 MG PO CAPS
ORAL_CAPSULE | ORAL | 3 refills | Status: DC
Start: 2023-06-04 — End: 2024-06-10

## 2023-06-04 MED ORDER — TAMSULOSIN HCL 0.4 MG PO CAPS: 0.4000 mg | ORAL_CAPSULE | Freq: Every day | ORAL | 3 refills | Status: DC

## 2023-06-04 MED ORDER — ATORVASTATIN CALCIUM 40 MG PO TABS: 40.0000 mg | ORAL_TABLET | Freq: Every day | ORAL | 3 refills | Status: DC

## 2023-06-27 ENCOUNTER — Ambulatory Visit: Payer: PPO | Admitting: Primary Care

## 2023-06-27 ENCOUNTER — Ambulatory Visit: Payer: PPO

## 2023-06-27 ENCOUNTER — Encounter: Payer: Self-pay | Admitting: Primary Care

## 2023-06-27 VITALS — BP 128/78 | HR 72 | Temp 97.7°F | Ht 72.0 in | Wt 248.2 lb

## 2023-06-27 DIAGNOSIS — R0602 Shortness of breath: Secondary | ICD-10-CM

## 2023-06-27 DIAGNOSIS — J811 Chronic pulmonary edema: Secondary | ICD-10-CM | POA: Diagnosis not present

## 2023-06-27 DIAGNOSIS — J9811 Atelectasis: Secondary | ICD-10-CM | POA: Diagnosis not present

## 2023-06-27 DIAGNOSIS — I517 Cardiomegaly: Secondary | ICD-10-CM | POA: Diagnosis not present

## 2023-06-27 LAB — CBC WITH DIFFERENTIAL/PLATELET
Basophils Absolute: 0.1 10*3/uL (ref 0.0–0.1)
Basophils Relative: 0.7 % (ref 0.0–3.0)
Eosinophils Absolute: 0.1 10*3/uL (ref 0.0–0.7)
Eosinophils Relative: 1.9 % (ref 0.0–5.0)
HCT: 43.1 % (ref 39.0–52.0)
Hemoglobin: 14.1 g/dL (ref 13.0–17.0)
Lymphocytes Relative: 19.9 % (ref 12.0–46.0)
Lymphs Abs: 1.5 10*3/uL (ref 0.7–4.0)
MCHC: 32.8 g/dL (ref 30.0–36.0)
MCV: 91 fL (ref 78.0–100.0)
Monocytes Absolute: 0.7 10*3/uL (ref 0.1–1.0)
Monocytes Relative: 9.4 % (ref 3.0–12.0)
Neutro Abs: 5 10*3/uL (ref 1.4–7.7)
Neutrophils Relative %: 68.1 % (ref 43.0–77.0)
Platelets: 238 10*3/uL (ref 150.0–400.0)
RBC: 4.74 Mil/uL (ref 4.22–5.81)
RDW: 13.9 % (ref 11.5–15.5)
WBC: 7.3 10*3/uL (ref 4.0–10.5)

## 2023-06-27 LAB — BASIC METABOLIC PANEL
BUN: 17 mg/dL (ref 6–23)
CO2: 24 meq/L (ref 19–32)
Calcium: 9.5 mg/dL (ref 8.4–10.5)
Chloride: 103 meq/L (ref 96–112)
Creatinine, Ser: 0.68 mg/dL (ref 0.40–1.50)
GFR: 95.33 mL/min (ref >60.00)
Glucose, Bld: 103 mg/dL — ABNORMAL HIGH (ref 70–99)
Potassium: 4.4 meq/L (ref 3.5–5.1)
Sodium: 138 meq/L (ref 135–145)

## 2023-06-27 LAB — BRAIN NATRIURETIC PEPTIDE: Pro B Natriuretic peptide (BNP): 291 pg/mL — ABNORMAL HIGH (ref 0.0–100.0)

## 2023-06-27 MED ORDER — IPRATROPIUM-ALBUTEROL 0.5-2.5 (3) MG/3ML IN SOLN
3.0000 mL | Freq: Once | RESPIRATORY_TRACT | Status: AC
  Administered 2023-06-27: 3 mL via RESPIRATORY_TRACT

## 2023-06-27 MED ORDER — SPIRIVA RESPIMAT 2.5 MCG/ACT IN AERS: 2.0000 | INHALATION_SPRAY | Freq: Every day | RESPIRATORY_TRACT | Status: DC

## 2023-06-27 NOTE — Patient Instructions (Addendum)
  YOUR PLAN:  -SLEEP APNEA: Sleep apnea is a condition where your breathing repeatedly stops and starts during sleep. Your recently implanted Inspire device will be activated on the upcoming scheduled date to help manage this condition. Sleep with head elevated as much as possible.   -ANXIETY: Anxiety is a feeling of worry or fear that can be intense and persistent. Since your current Xanax regimen is not fully effective, we have increased the dosage to three times daily to help manage your symptoms better.  -CHRONIC OBSTRUCTIVE PULMONARY DISEASE (COPD): COPD is a chronic inflammatory lung disease that obstructs airflow from the lungs. To address your constant shortness of breath, we have ordered a chest x-ray and some lab tests. We are also starting you on Spiriva Respimat, two puffs in the morning, and administered a nebulizer treatment in the office today. You can continue to use Albuterol rescue inhaler 2 puffs every 4-6 hours for breakthrough shortness of breath/wheezing. Additionally, we will schedule pulmonary function tests to further evaluate your condition.  -CARDIAC HEALTH: We are monitoring your heart health due to a previously noted enlarged heart and recent chest fluttering. An EKG will be performed today to check your heart rhythm, and recommend you contact your cardiologist if your symptoms persist or worsen.  ORDERS: CXR/labs/EKG today  PFTs first available   In office treatment: Ipratropium-albuterol nebulizer   FOLLOW-UP: Keep apt Monday / Go to ED if symptoms worsen

## 2023-06-27 NOTE — Progress Notes (Unsigned)
@Patient  ID: Todd Weeks, male    DOB: 07/18/1956, 67 y.o.   MRN: [id number]  Chief Complaint  Patient presents with   Acute Visit    C/o not sleeping, can't get a full breath going on several mths. Inspire activated 06/29/2023.    Referring provider: Ardith Dark, MD  HPI: 67 year old male, former smokr.  History significant for coronary artery disease, hypertension, lipidemia, GERD, OSA intolerant to CPAP.   06/27/2023 Patient presents today for acute visit. Reports that he can not sleep and has trouble breathing for the last several months. He is scheduled for activation on 07/01/23.  Discussed the use of AI scribe software for clinical note transcription with the patient, who gave verbal consent to proceed.  History of Present Illness   The patient, with a history of sleep apnea, emphysema, and anxiety, presents with worsening shortness of breath. He reports that the shortness of breath is constant, affecting him both day and night, and has been severe enough to cause sleep deprivation. The patient describes the sensation as an inability to take a full breath, occasionally feeling like he is suffocating. This has significantly impacted his daily activities, causing difficulty with tasks such as showering and tying shoes.  The patient has been using Xanax for anxiety, however, he reports that the medication has not been effective in alleviating his symptoms.   The patient had an Inspire device implanted for sleep apnea about a month ago, following unsuccessful attempts with CPAP therapy. Post-implantation, he experienced significant swelling in the neck and chest, which has been gradually resolving. He also reports a brief period of chest fluttering that lasted for several days and then resolved.  The patient has been using an albuterol inhaler for breakthrough shortness of breath. He denies any current chest pain or irregular heart rate. History of smoking for about 35 years, but  quit approximately 12-13 years ago.     No Known Allergies  Immunization History  Administered Date(s) Administered   Fluad Quad(high Dose 65+) 04/12/2023   Influenza Split 05/24/2009, 05/14/2015   Influenza, High Dose Seasonal PF 04/26/2019   Influenza,inj,Quad PF,6+ Mos 08/02/2016, 06/16/2017, 06/12/2018, 04/26/2019   Influenza-Unspecified 06/16/2017, 05/21/2020, 06/06/2021   Meningococcal Conjugate 04/16/2022   PFIZER Comirnaty(Gray Top)Covid-19 Tri-Sucrose Vaccine 11/28/2020   PFIZER(Purple Top)SARS-COV-2 Vaccination 10/22/2019, 11/11/2019, 03/27/2020, 05/12/2020, 06/06/2021, 06/18/2023   PNEUMOCOCCAL CONJUGATE-20 04/16/2022   Pneumococcal Polysaccharide-23 08/04/2018   Pneumococcal-Unspecified 04/16/2022   Respiratory Syncytial Virus Vaccine,Recomb Aduvanted(Arexvy) 04/16/2022   Rsv, Bivalent, Protein Subunit Rsvpref,pf Verdis Frederickson) 04/16/2022   Tdap 12/13/2014   Zoster Recombinant(Shingrix) 05/21/2020, 08/21/2020    Past Medical History:  Diagnosis Date   Bell's palsy    Cataract    Mixed form OU   Closed nondisplaced fracture of neck of third metacarpal bone of right hand 02/04/2017   Clotting disorder (HCC)    COPD (chronic obstructive pulmonary disease) (HCC)    Depression    Diverticulitis    GERD (gastroesophageal reflux disease)    Headache(784.0)    HTN (hypertension)    Hyperlipidemia    Hypertensive retinopathy    OU   Mild CAD 2012   PVD (peripheral vascular disease) (HCC)    Retinal detachment    Sexual dysfunction    Sleep apnea     Tobacco History: Social History   Tobacco Use  Smoking Status Former   Current packs/day: 0.00   Average packs/day: 1.5 packs/day for 46.0 years (69.0 ttl pk-yrs)   Types: Cigarettes   Start  date: 12/11/1964   Quit date: 12/12/2010   Years since quitting: 12.5  Smokeless Tobacco Never   Counseling given: Not Answered   Outpatient Medications Prior to Visit  Medication Sig Dispense Refill   ALPRAZolam (XANAX) 0.25  MG tablet Take 2 tablets (0.5 mg total) by mouth 2 (two) times daily as needed for anxiety. 90 tablet 1   aspirin EC 81 MG tablet Take 1 tablet (81 mg total) by mouth daily. 150 tablet 2   atorvastatin (LIPITOR) 40 MG tablet Take 1 tablet (40 mg total) by mouth daily. 90 tablet 3   clopidogrel (PLAVIX) 75 MG tablet Take 1 tablet (75 mg total) by mouth daily. 90 tablet 3   diclofenac Sodium (VOLTAREN) 1 % GEL Apply 1 application topically 3 (three) times daily as needed (leg cramps/spasms).     famotidine (PEPCID) 20 MG tablet Take 1 tablet (20 mg total) by mouth daily. 90 tablet 3   FLUoxetine (PROZAC) 40 MG capsule TAKE (1) CAPSULE BY MOUTH DAILY (Patient taking differently: Take 40 mg by mouth daily.) 90 capsule 3   gabapentin (NEURONTIN) 100 MG capsule Take 1-3 capsules (100-300 mg total) by mouth at bedtime as needed. 90 capsule 1   HYDROcodone-acetaminophen (NORCO/VICODIN) 5-325 MG tablet Take 1-2 tablets by mouth every 6 (six) hours as needed for moderate pain. 12 tablet 0   irbesartan (AVAPRO) 300 MG tablet Take 1 tablet (300 mg total) by mouth daily. 90 tablet 3   nitroGLYCERIN (NITROSTAT) 0.4 MG SL tablet Place 0.4 mg under the tongue every 5 (five) minutes as needed for chest pain.     pantoprazole (PROTONIX) 40 MG tablet Take 1 tablet (40 mg total) by mouth daily. Take 30-60 min before first meal of the day 30 tablet 2   tamsulosin (FLOMAX) 0.4 MG CAPS capsule Take 1 capsule (0.4 mg total) by mouth daily. 90 capsule 3   No facility-administered medications prior to visit.    Review of Systems  Review of Systems  Constitutional:  Positive for fatigue and unexpected weight change.  Respiratory:  Positive for shortness of breath.   Psychiatric/Behavioral:  Positive for sleep disturbance.    Physical Exam  There were no vitals taken for this visit. Physical Exam Constitutional:      Appearance: Normal appearance. He is obese.  HENT:     Head: Normocephalic and atraumatic.   Cardiovascular:     Rate and Rhythm: Normal rate and regular rhythm.     Comments: RRR Pulmonary:     Effort: No respiratory distress.     Breath sounds: No stridor. No wheezing, rhonchi or rales.     Comments: CTA; dyspnea with minimal exertion  Musculoskeletal:        General: Normal range of motion.     Cervical back: Normal range of motion and neck supple.  Skin:    General: Skin is warm and dry.  Neurological:     General: No focal deficit present.     Mental Status: He is alert and oriented to person, place, and time. Mental status is at baseline.  Psychiatric:        Mood and Affect: Mood normal.        Behavior: Behavior normal.        Thought Content: Thought content normal.        Judgment: Judgment normal.      Lab Results:  CBC    Component Value Date/Time   WBC 7.8 04/23/2022 0830   RBC 4.68 04/23/2022  0830   HGB 14.3 04/23/2022 0830   HGB 14.3 03/23/2021 1601   HCT 42.4 04/23/2022 0830   HCT 43.9 03/23/2021 1601   PLT 241.0 04/23/2022 0830   PLT 234 03/23/2021 1601   MCV 90.7 04/23/2022 0830   MCV 87 03/23/2021 1601   MCH 28.3 03/23/2021 1601   MCH 29.6 05/26/2020 1305   MCHC 33.6 04/23/2022 0830   RDW 13.0 04/23/2022 0830   RDW 13.0 03/23/2021 1601   LYMPHSABS 1.4 03/30/2021 0856   MONOABS 0.7 03/30/2021 0856   EOSABS 0.3 03/30/2021 0856   BASOSABS 0.0 03/30/2021 0856    BMET    Component Value Date/Time   NA 139 04/23/2022 0830   NA 138 03/23/2021 1601   K 4.7 04/23/2022 0830   CL 103 04/23/2022 0830   CO2 28 04/23/2022 0830   GLUCOSE 93 04/23/2022 0830   BUN 20 04/23/2022 0830   BUN 17 03/23/2021 1601   CREATININE 0.74 04/23/2022 0830   CALCIUM 9.5 04/23/2022 0830   GFRNONAA >60 05/26/2020 1305   GFRAA >60 02/08/2020 1040    BNP No results found for: "BNP"  ProBNP No results found for: "PROBNP"  Imaging: No results found.   Assessment & Plan:   1. Shortness of breath - DG Chest 2 View; Future - CBC w/Diff; Future -  Basic metabolic panel; Future - Brain natriuretic peptide; Future - EKG 12-Lead - Brain natriuretic peptide - CBC w/Diff - Basic metabolic panel - Pulmonary function test; Future - ipratropium-albuterol (DUONEB) 0.5-2.5 (3) MG/3ML nebulizer solution 3 mL - ECHOCARDIOGRAM COMPLETE; Future      Sleep Apnea Recently implanted Inspire device, not yet activated. Patient previously intolerant to CPAP. Reports significant post-operative swelling, which is gradually resolving. -Activate Inspire device on upcoming scheduled date.  Anxiety Persistent despite current Xanax regimen. No reported drowsiness or grogginess with current dose. -Increase Xanax to three times daily.  Chronic Obstructive Pulmonary Disease (COPD) Patient reports all-day shortness of breath, impacting daily activities. Currently using Albuterol for breakthrough symptoms. -Order chest x-ray and labs (CBC, BMAT, BNP) to assess for other potential causes of dyspnea. -Initiate Spiriva Respimat 2.5MCG, two puffs in the morning. -Administer nebulizer treatment with ipratropium-albuterol in office today with some improvement  -Schedule pulmonary function tests for future date.  Cardiac Health Enlarged heart noted on previous imaging. Recent transient fluttering sensation in chest, but no current irregular heart rate or chest pain. -Perform EKG today to assess heart rhythm. -Consider referral to cardiologist if symptoms persist or worsen.       Glenford Bayley, NP 06/27/2023

## 2023-06-28 MED ORDER — FUROSEMIDE 20 MG PO TABS: 20.0000 mg | ORAL_TABLET | Freq: Every day | ORAL | 0 refills | Status: DC

## 2023-06-28 NOTE — Progress Notes (Signed)
I also ordered an echocardiogram due to dyspnea and elevated BNP

## 2023-07-01 ENCOUNTER — Other Ambulatory Visit: Payer: Self-pay | Admitting: *Deleted

## 2023-07-01 ENCOUNTER — Encounter: Payer: Self-pay | Admitting: Primary Care

## 2023-07-01 ENCOUNTER — Telehealth: Payer: Self-pay | Admitting: Family Medicine

## 2023-07-01 ENCOUNTER — Ambulatory Visit: Payer: PPO | Admitting: Primary Care

## 2023-07-01 VITALS — BP 136/79 | HR 81 | Temp 97.3°F | Ht 72.0 in | Wt 246.8 lb

## 2023-07-01 DIAGNOSIS — G4733 Obstructive sleep apnea (adult) (pediatric): Secondary | ICD-10-CM

## 2023-07-01 DIAGNOSIS — K219 Gastro-esophageal reflux disease without esophagitis: Secondary | ICD-10-CM

## 2023-07-01 MED ORDER — FAMOTIDINE 20 MG PO TABS: 20.0000 mg | ORAL_TABLET | Freq: Every day | ORAL | 3 refills | Status: DC

## 2023-07-01 MED ORDER — IRBESARTAN 300 MG PO TABS: 300.0000 mg | ORAL_TABLET | Freq: Every day | ORAL | 3 refills | Status: DC

## 2023-07-01 NOTE — Telephone Encounter (Signed)
Prescription Request  07/01/2023  LOV: 04/03/2023  What is the name of the medication or equipment? irbesartan (AVAPRO) 300 MG tablet   famotidine (PEPCID) 20 MG tablet    Have you contacted your pharmacy to request a refill? Yes   Which pharmacy would you like this sent to?  [facility] - [place], [place] - [address] [place] [address] Phone: [phone] Fax: [phone]   Patient notified that their request is being sent to the clinical staff for review and that they should receive a response within 2 business days.   Please advise at Mobile [phone] (mobile)

## 2023-07-01 NOTE — Patient Instructions (Addendum)
Please reach out to cardiologist regarding shortness of breath, we think could be related to your heart. BNP was elevated, started you on lasix 20mg  daily x 5 days and ordered an echocardiogram  Continue to use Spiriva Respimat 2.64mcg two puffs daily in the morning and albuterol every 4-6 hours as needed for shortness of breath   Please discuss re-starting prozac with your primary care, continue  xanax for now 0.5mg   three times a day but we will decrease this as your shortness of breath/possible underlying anxiety improves   Today your Todd Weeks was activated, you are currently on Level 1 (0.5V).  We would like you to use your Inspire device consistently every night for 4 hours or more  Once a week (every Monday) please increase 1 level as tolerates You have 10 levels You can back down if having an discomfort or difficulty tolerating that level, re-try a couple of days later   Start delay = 60 mins  Pause delay= 30 mins  Duration therapy= 7 hours  We will repeat sleep study in approximately 12 weeks We will check in twice before then

## 2023-07-01 NOTE — Telephone Encounter (Signed)
Rx send to [facility]

## 2023-07-01 NOTE — Assessment & Plan Note (Addendum)
Hx sleep apnea, intolerant to CPAP. He has a lot of difficulty sleeping. Wakes up frequently gasping for air.   Inspire implantation 05/13/23  Inspire activation 07/01/2023  Presents today for [employer] activation. He has some swelling right neck which has decreased since implantation. Tongue midline without deviation. No weakness in tongue or lower lip. No issues swallowing. Patient had no discomfort with activation today. Reviewed and educated patient on sleep remote.  Patient was able to demonstrate competency with the remote and is aware of the patient app instructional videos and sleep quick guide.  Patient was instructed to use therapy every night for minimum 4 to 6 hours.  Patient's current stimulation level is 0.5 V (level 1). Patient will step up one level (0.1V) every Monday.  Patient will be scheduled for check-in visit in 6 weeks to ensure they are using therapy every night, evaluate tolerance and subjective benefit.  Patient will be scheduled for titration sleep study no sooner than 3 months after activation to assess adherence, fine-tune stimulation settings and evaluate efficacy.  Stimulation level- Sensation:  __0.4__ V / Functional level: _0.5___ V (Range 0.5-1.5 V) Start delay=60 mins Pause delay=30 mins Duration of therapy=7 hours

## 2023-07-01 NOTE — Progress Notes (Signed)
@Patient  ID: Todd Weeks, male    DOB: 03/18/1956, 67 y.o.   MRN: [id number]  Chief Complaint  Patient presents with   Follow-up    Referring provider: Ardith Dark, MD  HPI: 67 year old male, former smokr.  History significant for coronary artery disease, hypertension, lipidemia, GERD, OSA intolerant to CPAP.   Previous [facility] pulmonary encounter: 06/27/2023 Patient presents today for acute visit. Reports that he can not sleep and has trouble breathing for the last several months. He is scheduled for activation on 07/01/23.  Discussed the use of AI scribe software for clinical note transcription with the patient, who gave verbal consent to proceed.  History of Present Illness   The patient, with a history of sleep apnea, emphysema, and anxiety, presents with worsening shortness of breath. He reports that the shortness of breath is constant, affecting him both day and night, and has been severe enough to cause sleep deprivation. The patient describes the sensation as an inability to take a full breath, occasionally feeling like he is suffocating. This has significantly impacted his daily activities, causing difficulty with tasks such as showering and tying shoes.  The patient has been using Xanax for anxiety, however, he reports that the medication has not been effective in alleviating his symptoms.   The patient had an Inspire device implanted for sleep apnea about a month ago, following unsuccessful attempts with CPAP therapy. Post-implantation, he experienced significant swelling in the neck and chest, which has been gradually resolving. He also reports a brief period of chest fluttering that lasted for several days and then resolved.  The patient has been using an albuterol inhaler for breakthrough shortness of breath. He denies any current chest pain or irregular heart rate. History of smoking for about 35 years, but quit approximately 12-13 years ago.  Sleep Apnea Recently  implanted Inspire device, not yet activated. Patient previously intolerant to CPAP. Reports significant post-operative swelling, which is gradually resolving. -Activate Inspire device on upcoming scheduled date.  Anxiety Persistent despite current Xanax regimen. No reported drowsiness or grogginess with current dose. -Increase Xanax to three times daily.  Chronic Obstructive Pulmonary Disease (COPD) Patient reports all-day shortness of breath, impacting daily activities. Currently using Albuterol for breakthrough symptoms. -Order chest x-ray and labs (CBC, BMAT, BNP) to assess for other potential causes of dyspnea. -Initiate Spiriva Respimat 2.5MCG, two puffs in the morning. -Administer nebulizer treatment with ipratropium-albuterol in office today with some improvement  -Schedule pulmonary function tests for future date.   07/01/2023- Interim hx  Discussed the use of AI scribe software for clinical note transcription with the patient, who gave verbal consent to proceed.  History of Present Illness   Inspire implantation 05/13/23  Inspire activation 07/01/2023  Patient was seen last week for shortness of breath symptoms.  Shortness of breath primarily occurs at night but he recently began having symptoms during the day as well.  He is a former smoker. CT imaging of his lungs in the past that showed some mild emphysema and COPD findings.  Pulmonary function testing in 2019 was normal.  Chest x-ray on 06/27/2023 showed cardiomegaly with interstitial pulmonary edema.  BNP elevated.  Patient was sent in 20 mg Lasix to take daily for 5 days and ordered for echocardiogram. He has not picked up prescription. He will follow up with his cardiologist. Patient has been using Spiriva Respimat 2 puffs daily as directed with slight improvement in daytime dyspnea symptoms.  He continues to wake up short  of breath at night due to obstructive sleep apnea.   Patient presents today for Inspire activation. He had  some post-op swelling to his right neck which has significantly improved but is still visible. The swelling is not associated with pain, difficulty swallowing, or changes in speech. The patient denies any discomfort in the tongue or the back of the throat. The patient reports difficulty sleeping, often waking up at three in the morning and finding it easier to breathe when sitting up. The patient has been using two pillows for elevation and also has a recliner available for use.   The patient has a history of anxiety, for which she has been taking Xanax. The patient has previously been on Prozac for depression but stopped taking for unspecified reasons. The patient also reports occasional alcohol consumption, mainly on days off.   No Known Allergies  Immunization History  Administered Date(s) Administered   Fluad Quad(high Dose 65+) 04/12/2023   Influenza Split 05/24/2009, 05/14/2015   Influenza, High Dose Seasonal PF 04/26/2019   Influenza,inj,Quad PF,6+ Mos 08/02/2016, 06/16/2017, 06/12/2018, 04/26/2019   Influenza-Unspecified 06/16/2017, 05/21/2020, 06/06/2021   Meningococcal Conjugate 04/16/2022   PFIZER Comirnaty(Gray Top)Covid-19 Tri-Sucrose Vaccine 11/28/2020   PFIZER(Purple Top)SARS-COV-2 Vaccination 10/22/2019, 11/11/2019, 03/27/2020, 05/12/2020, 06/06/2021, 06/18/2023   PNEUMOCOCCAL CONJUGATE-20 04/16/2022   Pneumococcal Polysaccharide-23 08/04/2018   Pneumococcal-Unspecified 04/16/2022   Respiratory Syncytial Virus Vaccine,Recomb Aduvanted(Arexvy) 04/16/2022   Rsv, Bivalent, Protein Subunit Rsvpref,pf Verdis Frederickson) 04/16/2022   Tdap 12/13/2014   Zoster Recombinant(Shingrix) 05/21/2020, 08/21/2020    Past Medical History:  Diagnosis Date   Bell's palsy    Cataract    Mixed form OU   Closed nondisplaced fracture of neck of third metacarpal bone of right hand 02/04/2017   Clotting disorder (HCC)    COPD (chronic obstructive pulmonary disease) (HCC)    Depression     Diverticulitis    GERD (gastroesophageal reflux disease)    Headache(784.0)    HTN (hypertension)    Hyperlipidemia    Hypertensive retinopathy    OU   Mild CAD 2012   PVD (peripheral vascular disease) (HCC)    Retinal detachment    Sexual dysfunction    Sleep apnea     Tobacco History: Social History   Tobacco Use  Smoking Status Former   Current packs/day: 0.00   Average packs/day: 1.5 packs/day for 46.0 years (69.0 ttl pk-yrs)   Types: Cigarettes   Start date: 12/11/1964   Quit date: 12/12/2010   Years since quitting: 12.5  Smokeless Tobacco Never   Counseling given: Not Answered   Outpatient Medications Prior to Visit  Medication Sig Dispense Refill   ALPRAZolam (XANAX) 0.25 MG tablet Take 2 tablets (0.5 mg total) by mouth 2 (two) times daily as needed for anxiety. 90 tablet 1   aspirin EC 81 MG tablet Take 1 tablet (81 mg total) by mouth daily. 150 tablet 2   atorvastatin (LIPITOR) 40 MG tablet Take 1 tablet (40 mg total) by mouth daily. 90 tablet 3   clopidogrel (PLAVIX) 75 MG tablet Take 1 tablet (75 mg total) by mouth daily. 90 tablet 3   diclofenac Sodium (VOLTAREN) 1 % GEL Apply 1 application topically 3 (three) times daily as needed (leg cramps/spasms).     FLUoxetine (PROZAC) 40 MG capsule TAKE (1) CAPSULE BY MOUTH DAILY (Patient taking differently: Take 40 mg by mouth daily.) 90 capsule 3   furosemide (LASIX) 20 MG tablet Take 1 tablet (20 mg total) by mouth daily. 5 tablet 0   gabapentin (  NEURONTIN) 100 MG capsule Take 1-3 capsules (100-300 mg total) by mouth at bedtime as needed. 90 capsule 1   HYDROcodone-acetaminophen (NORCO/VICODIN) 5-325 MG tablet Take 1-2 tablets by mouth every 6 (six) hours as needed for moderate pain. 12 tablet 0   nitroGLYCERIN (NITROSTAT) 0.4 MG SL tablet Place 0.4 mg under the tongue every 5 (five) minutes as needed for chest pain.     pantoprazole (PROTONIX) 40 MG tablet Take 1 tablet (40 mg total) by mouth daily. Take 30-60 min before  first meal of the day 30 tablet 2   tamsulosin (FLOMAX) 0.4 MG CAPS capsule Take 1 capsule (0.4 mg total) by mouth daily. 90 capsule 3   Tiotropium Bromide Monohydrate (SPIRIVA RESPIMAT) 2.5 MCG/ACT AERS Inhale 2 puffs into the lungs daily.     famotidine (PEPCID) 20 MG tablet Take 1 tablet (20 mg total) by mouth daily. 90 tablet 3   irbesartan (AVAPRO) 300 MG tablet Take 1 tablet (300 mg total) by mouth daily. 90 tablet 3   No facility-administered medications prior to visit.   Review of Systems  Review of Systems  Constitutional:  Positive for fatigue.  HENT: Negative.    Respiratory:  Positive for shortness of breath. Negative for cough, chest tightness and wheezing.   Cardiovascular: Negative.   Psychiatric/Behavioral:  Positive for sleep disturbance.    Physical Exam  BP 136/79 (BP Location: Right Arm, Patient Position: Sitting, Cuff Size: Normal)   Pulse 81   Temp (!) 97.3 F (36.3 C) (Oral)   Ht 6' (1.829 m)   Wt 246 lb 12.8 oz (111.9 kg)   SpO2 97%   BMI 33.47 kg/m  Physical Exam Constitutional:      General: He is not in acute distress.    Appearance: Normal appearance. He is not ill-appearing.  HENT:     Head: Normocephalic and atraumatic.     Mouth/Throat:     Comments: Tongue midline  Neck:     Comments: Right neck swelling; Inspire device implanted right chest wall  Cardiovascular:     Rate and Rhythm: Normal rate and regular rhythm.  Pulmonary:     Effort: Pulmonary effort is normal.     Breath sounds: No wheezing, rhonchi or rales.  Musculoskeletal:        General: Normal range of motion.     Cervical back: No tenderness.  Skin:    General: Skin is warm and dry.  Neurological:     General: No focal deficit present.     Mental Status: He is alert and oriented to person, place, and time. Mental status is at baseline.  Psychiatric:        Mood and Affect: Mood normal.        Behavior: Behavior normal.        Thought Content: Thought content normal.         Judgment: Judgment normal.      Lab Results:  CBC    Component Value Date/Time   WBC 7.3 06/27/2023 0940   RBC 4.74 06/27/2023 0940   HGB 14.1 06/27/2023 0940   HGB 14.3 03/23/2021 1601   HCT 43.1 06/27/2023 0940   HCT 43.9 03/23/2021 1601   PLT 238.0 06/27/2023 0940   PLT 234 03/23/2021 1601   MCV 91.0 06/27/2023 0940   MCV 87 03/23/2021 1601   MCH 28.3 03/23/2021 1601   MCH 29.6 05/26/2020 1305   MCHC 32.8 06/27/2023 0940   RDW 13.9 06/27/2023 0940   RDW 13.0 03/23/2021  1601   LYMPHSABS 1.5 06/27/2023 0940   MONOABS 0.7 06/27/2023 0940   EOSABS 0.1 06/27/2023 0940   BASOSABS 0.1 06/27/2023 0940    BMET    Component Value Date/Time   NA 138 06/27/2023 0940   NA 138 03/23/2021 1601   K 4.4 06/27/2023 0940   CL 103 06/27/2023 0940   CO2 24 06/27/2023 0940   GLUCOSE 103 (H) 06/27/2023 0940   BUN 17 06/27/2023 0940   BUN 17 03/23/2021 1601   CREATININE 0.68 06/27/2023 0940   CALCIUM 9.5 06/27/2023 0940   GFRNONAA >60 05/26/2020 1305   GFRAA >60 02/08/2020 1040    BNP No results found for: "BNP"  ProBNP    Component Value Date/Time   PROBNP 291.0 (H) 06/27/2023 0940    Imaging: DG Chest 2 View  Result Date: 06/27/2023 CLINICAL DATA:  Acute shortness of breath. EXAM: CHEST - 2 VIEW COMPARISON:  Chest radiograph 05/13/2023. FINDINGS: Right chest generator for sublingual nerve stimulator. Stable cardiomegaly and mediastinal contours. Linear atelectasis in the right lower lung. Basilar predominant reticular opacities are favored to reflect mild interstitial pulmonary edema. No pleural effusion pneumothorax. IMPRESSION: Cardiomegaly with mild interstitial pulmonary edema. Electronically Signed   By: Orvan Falconer M.D.   On: 06/27/2023 12:38     Assessment & Plan:   OSA (obstructive sleep apnea) Hx sleep apnea, intolerant to CPAP. He has a lot of difficulty sleeping. Wakes up frequently gasping for air.   Inspire implantation 05/13/23  Inspire  activation 07/01/2023  Presents today for [employer] activation. He has some swelling right neck which has decreased since implantation. Tongue midline without deviation. No weakness in tongue or lower lip. No issues swallowing. Patient had no discomfort with activation today. Reviewed and educated patient on sleep remote.  Patient was able to demonstrate competency with the remote and is aware of the patient app instructional videos and sleep quick guide.  Patient was instructed to use therapy every night for minimum 4 to 6 hours.  Patient's current stimulation level is 0.5 V (level 1). Patient will step up one level (0.1V) every week (Monday's). Patient will be scheduled for check-in visit in 6 weeks to ensure they are using therapy every night, evaluate tolerance and subjective benefit.  Patient will be scheduled for titration sleep study 3 months after activation to assess adherence, fine-tune stimulation settings and evaluate efficacy. Advised patient consider sleeping in a recliner or with elevated upper body to assist with breathing during sleep. Advised to moderate alcohol intake due to potential exacerbation of sleep apnea.  Stimulation level- Sensation:  __0.4__ V / Functional level: _0.5___ V (Range 0.5-1.5 V) Start delay=60 mins Pause delay=30 mins Duration of therapy=7 hours    Anxiety Patient currently taking Xanax, but not taking prescribed Prozac. -Recommend reaching out to Dr. Jimmey Ralph to discuss restarting Prozac to better manage anxiety and potential depression. -Plan to decrease Xanax in the future, once sleep apnea therapy is more established.  Dyspnea - Patient was seen last week for sob symptoms. He experience dyspnea symptoms with exertion and at night, likely due to underlying OSA. Anxiety contributing some.  He was started on Spiriva Respimat for emphysema/COPD. CXR on 06/27/23 showed cardiomegaly with mild pulmonary edema. BNP was elevated, started on lasix 20mg  daily x 5 days  and ordered an echocardiogram. Instructed patient reach out to cardiologist regarding shortness of breath which could be cardiac related.  Emphysema  - Former smoker. Patient was noted to have mild diffuse bronchial wall thickening  with mild centrilobular and paraseptal emphysema on CT imaging. PFTs in 2019 were normal.  -Continue Spiriva Respimat 2.75mcg two puffs twice daily and albuterol every 4-6 hours as needed for shortness of breath.  - Ordered for pulmonary function testing    Glenford Bayley, NP 07/01/2023

## 2023-07-09 DIAGNOSIS — I5030 Unspecified diastolic (congestive) heart failure: Secondary | ICD-10-CM | POA: Insufficient documentation

## 2023-07-09 NOTE — Progress Notes (Deleted)
   Cardiology Office Note:    Date:  07/09/2023  ID:  Todd Weeks, DOB 07-16-1956, MRN [id number] PCP: Ardith Dark, MD  [facility] Providers Cardiologist:  Verne Carrow, MD { Click to update primary MD,subspecialty MD or APP then REFRESH:1}    {Click to Open Review  :1}   Patient Profile:      Coronary artery disease  Cath in 2012: mild dz in RCA; med Rx TTE 04/26/18: EF 55-60, no RWMA, trivial MR, mild LAE, normal RVSF, mild RAE, trivial TR  Cath 04/05/21: mild to mod non-obs dz in RCA (pRCA 40, mRCA 40, dRCA 30, EF 55-65); med Rx  Hypertension  Hyperlipidemia  Peripheral arterial disease  S/p R SFA stent in 2012 and 5/22 +Cigs Depression   Carotid US 09/10/22: no ICA stenosis  OSA       {      :1}   History of Present Illness:  Discussed the use of AI scribe software for clinical note transcription with the patient, who gave verbal consent to proceed.  Todd Weeks is a 67 y.o. male who returns for evaluation of shortness of breath. He was last seen by Dr. Clifton James in 09/2022. He was seen in pulmonology clinic 06/27/23 with dyspnea. BNP was elevated and CXR showed edema. He was started on Lasix. Follow up echocardiogram is pending.             ROS   See HPI ***    Studies Reviewed:       *** Results          Risk Assessment/Calculations:   {Does this patient have ATRIAL FIBRILLATION?:[id number]} No BP recorded.  {Refresh Note OR Click here to enter BP  :1}***       Physical Exam:   VS:  There were no vitals taken for this visit.   Wt Readings from Last 3 Encounters:  07/01/23 246 lb 12.8 oz (111.9 kg)  06/27/23 248 lb 3.2 oz (112.6 kg)  05/13/23 237 lb 10.5 oz (107.8 kg)    Physical Exam***     Assessment and Plan:   Assessment & Plan Chronic heart failure with preserved ejection fraction (HCC)  Coronary artery disease involving native coronary artery of native heart without angina pectoris  Primary hypertension   Assessment and  Plan          { 1. Coronary artery disease involving native coronary artery of native heart without angina pectoris He has a history of nonobstructive coronary artery disease.  Most recent cardiac catheterization demonstrated mild to moderate disease in the RCA and no disease in the LAD or LCx.  He is doing well without anginal symptoms.  Continue aspirin 81 mg daily, atorvastatin 40 mg daily.  Follow-up with Dr. Clifton James in 1 year.   2. PAD (peripheral artery disease) (HCC) Managed by vascular surgery.  He did have stenting to the right SFA in May 2022.  He is on indefinite dual antiplatelet therapy.  Follow-up with vascular surgery as planned.   3. Essential hypertension Blood pressure is controlled.  Continue irbesartan 300 mg daily.   4. Hyperlipidemia LDL goal <70 Continue atorvastatin 40 mg daily.     :1}    {Are you ordering a CV Procedure (e.g. stress test, cath, DCCV, TEE, etc)?   Press F2        :[id number]}  Dispo:  No follow-ups on file.  Signed, Tereso Newcomer, PA-C

## 2023-07-10 ENCOUNTER — Ambulatory Visit: Payer: PPO | Attending: Physician Assistant | Admitting: Physician Assistant

## 2023-07-10 DIAGNOSIS — I2510 Atherosclerotic heart disease of native coronary artery without angina pectoris: Secondary | ICD-10-CM

## 2023-07-10 DIAGNOSIS — I10 Essential (primary) hypertension: Secondary | ICD-10-CM

## 2023-07-10 DIAGNOSIS — I5032 Chronic diastolic (congestive) heart failure: Secondary | ICD-10-CM

## 2023-07-15 ENCOUNTER — Telehealth: Payer: Self-pay | Admitting: Cardiovascular Disease

## 2023-07-15 ENCOUNTER — Ambulatory Visit (HOSPITAL_BASED_OUTPATIENT_CLINIC_OR_DEPARTMENT_OTHER): Payer: PPO | Admitting: Pulmonary Disease

## 2023-07-15 DIAGNOSIS — R0602 Shortness of breath: Secondary | ICD-10-CM | POA: Diagnosis not present

## 2023-07-15 LAB — PULMONARY FUNCTION TEST
DL/VA % pred: 100 %
DL/VA: 4.1 ml/min/mmHg/L
DLCO cor % pred: 93 %
DLCO cor: 25.21 ml/min/mmHg
DLCO unc % pred: 91 %
DLCO unc: 24.85 ml/min/mmHg
FEF 25-75 Post: 2.24 L/s
FEF 25-75 Pre: 1.53 L/s
FEF2575-%Change-Post: 46 %
FEF2575-%Pred-Post: 83 %
FEF2575-%Pred-Pre: 57 %
FEV1-%Change-Post: 11 %
FEV1-%Pred-Post: 83 %
FEV1-%Pred-Pre: 74 %
FEV1-Post: 2.88 L
FEV1-Pre: 2.58 L
FEV1FVC-%Change-Post: 11 %
FEV1FVC-%Pred-Pre: 92 %
FEV6-%Change-Post: 1 %
FEV6-%Pred-Post: 85 %
FEV6-%Pred-Pre: 84 %
FEV6-Post: 3.79 L
FEV6-Pre: 3.72 L
FEV6FVC-%Change-Post: 0 %
FEV6FVC-%Pred-Post: 105 %
FEV6FVC-%Pred-Pre: 105 %
FVC-%Change-Post: 0 %
FVC-%Pred-Post: 81 %
FVC-%Pred-Pre: 81 %
FVC-Post: 3.79 L
FVC-Pre: 3.78 L
Post FEV1/FVC ratio: 76 %
Post FEV6/FVC ratio: 100 %
Pre FEV1/FVC ratio: 68 %
Pre FEV6/FVC Ratio: 100 %
RV % pred: 122 %
RV: 3.02 L
TLC % pred: 96 %
TLC: 6.96 L

## 2023-07-15 NOTE — Telephone Encounter (Signed)
Patient calling in because he has been having increased SOB. He has been seeing his pulmonologist who has ordered a CXR (which per patient showed some evidence of COPD as well as cardiomegaly) and a BNP which was elevated. His pulmonologist started him on a short course of lasix for his SOB (he states no leg swelling present) which he states helped a little but he wants to discuss his symptoms with Dr. Clifton James. Appt made for 07/22/23.

## 2023-07-15 NOTE — Progress Notes (Signed)
Full PFT Performed Today  

## 2023-07-15 NOTE — Patient Instructions (Signed)
Full PFT Performed Today  

## 2023-07-15 NOTE — Telephone Encounter (Signed)
Pt c/o Shortness Of Breath: STAT if SOB developed within the last 24 hours or pt is noticeably SOB on the phone  1. Are you currently SOB (can you hear that pt is SOB on the phone)?   2. How [place] have you been experiencing SOB?   3. Are you SOB when sitting or when up moving around?   4. Are you currently experiencing any other symptoms?   Patient sent message to scheduling for SOB, this is his response to the questions:  Yes to all above, on a moderate level at this time has been going on for months. Having polmalary function test today 07/15/23,Dr Clent Ridges has put me on a temporary inhaler also had surgery to implant Inspire to solve my sleep apnea.

## 2023-07-18 ENCOUNTER — Other Ambulatory Visit: Payer: Self-pay | Admitting: Primary Care

## 2023-07-22 ENCOUNTER — Encounter: Payer: Self-pay | Admitting: Cardiovascular Disease

## 2023-07-22 ENCOUNTER — Ambulatory Visit: Payer: PPO | Attending: Cardiovascular Disease | Admitting: Cardiovascular Disease

## 2023-07-22 VITALS — BP 128/70 | HR 81 | Ht 71.0 in | Wt 248.0 lb

## 2023-07-22 DIAGNOSIS — I10 Essential (primary) hypertension: Secondary | ICD-10-CM

## 2023-07-22 DIAGNOSIS — I5032 Chronic diastolic (congestive) heart failure: Secondary | ICD-10-CM | POA: Diagnosis not present

## 2023-07-22 DIAGNOSIS — E782 Mixed hyperlipidemia: Secondary | ICD-10-CM | POA: Diagnosis not present

## 2023-07-22 DIAGNOSIS — I739 Peripheral vascular disease, unspecified: Secondary | ICD-10-CM

## 2023-07-22 DIAGNOSIS — I2510 Atherosclerotic heart disease of native coronary artery without angina pectoris: Secondary | ICD-10-CM | POA: Diagnosis not present

## 2023-07-22 MED ORDER — FUROSEMIDE 20 MG PO TABS: 20.0000 mg | ORAL_TABLET | Freq: Every day | ORAL | 3 refills | Status: DC

## 2023-07-22 NOTE — Progress Notes (Signed)
Chief Complaint  Patient presents with   Follow-up    CAD, dyspnea, CHF   History of Present Illness: 67 yo male with history of HTN, hyperlipidemia, LBBB, CAD, PAD, tobacco abuse and depression here today for cardiac follow up. I saw him last in 2019. I had followed him for PAD in the past. Cardiac cath in May 2012 with mild disease in the RCA and no disease in the LAD or Circumflex. His PAD is followed in the [facility] office. He has had stenting of the right superficial femoral artery. Known occlusion of the left superficial femoral artery. He was admitted to [facility] September 2019 with dyspnea and chest pain. Troponin negative. Echo 04/26/18 with normal LV systolic function, LVEF=55-60%. There were no wall motion abnormalities. No significant valve disease. Nuclear stress test 05/12/18 with no ischemia. He was seen in August 2022 with chest pain. Cardiac cath August 2022 with mild RCA disease (40% lesions in the proximal and mid RCA). Carotid artery dopplers January 2024 with no disease.   He is here today for follow up. He called our office last week with c/o dyspnea. He had been seen in the pulmonary office. BNP elevated at 291. Chest x-ray with mild pulmonary edema. EKG with sinus, LBBB (chronic). He was started on Lasix. He denies any chest pain, palpitations, lower extremity edema, orthopnea, PND, dizziness, near syncope or syncope.   Primary Care Physician: Ardith Dark, MD  Past Medical History:  Diagnosis Date   Bell's palsy    Cataract    Mixed form OU   Closed nondisplaced fracture of neck of third metacarpal bone of right hand 02/04/2017   Clotting disorder (HCC)    COPD (chronic obstructive pulmonary disease) (HCC)    Depression    Diverticulitis    GERD (gastroesophageal reflux disease)    Headache(784.0)    HTN (hypertension)    Hyperlipidemia    Hypertensive retinopathy    OU   Mild CAD 2012   PVD (peripheral vascular disease) (HCC)    Retinal detachment    Sexual  dysfunction    Sleep apnea     Past Surgical History:  Procedure Laterality Date   ABDOMINAL AORTOGRAM W/LOWER EXTREMITY Bilateral 02/05/2020   Procedure: ABDOMINAL AORTOGRAM W/LOWER EXTREMITY;  Surgeon: Sherren Kerns, MD;  Location: [facility];  Service: Cardiovascular;  Laterality: Bilateral;   ABDOMINAL AORTOGRAM W/LOWER EXTREMITY N/A 12/16/2020   Procedure: ABDOMINAL AORTOGRAM W/LOWER EXTREMITY;  Surgeon: Sherren Kerns, MD;  Location: [facility];  Service: Cardiovascular;  Laterality: N/A;   CATARACT EXTRACTION     DRUG INDUCED ENDOSCOPY Right 01/15/2023   Procedure: DRUG INDUCED SLEEP  ENDOSCOPY;  Surgeon: Christia Reading, MD;  Location: [facility];  Service: ENT;  Laterality: Right;   EYE SURGERY Right 05/06/2020   Pneumatic retinopexy for rheg. RD repair - Dr. Rennis Chris   EYE SURGERY Right 05/26/2020   PPV/MP - Dr. Rennis Chris   GAS INSERTION Right 05/26/2020   Procedure: INSERTION OF GAS;  Surgeon: Rennis Chris, MD;  Location: [facility] OR;  Service: Ophthalmology;  Laterality: Right;   IMPLANTATION OF HYPOGLOSSAL NERVE STIMULATOR Right 05/13/2023   Procedure: IMPLANTATION OF HYPOGLOSSAL NERVE STIMULATOR;  Surgeon: Christia Reading, MD;  Location: [facility];  Service: ENT;  Laterality: Right;   INGUINAL HERNIA REPAIR Right 05/29/2021   Procedure: RIGHT INGUINAL HERNIA REPAIR WITH MESH;  Surgeon: Griselda Miner, MD;  Location: [facility];  Service: General;  Laterality: Right;  TAP BLOCK   LEFT HEART CATH AND CORONARY ANGIOGRAPHY N/A 04/05/2021   Procedure: LEFT HEART CATH AND CORONARY ANGIOGRAPHY;  Surgeon: Kathleene Hazel, MD;  Location: [facility];  Service: Cardiovascular;  Laterality: N/A;   MEMBRANE PEEL Right 05/26/2020   Procedure: MEMBRANE PEEL;  Surgeon: Rennis Chris, MD;  Location: [facility] OR;  Service: Ophthalmology;  Laterality: Right;   PARS PLANA VITRECTOMY Right 05/26/2020   Procedure: PARS  PLANA VITRECTOMY WITH 25 GAUGE;  Surgeon: Rennis Chris, MD;  Location: [facility] OR;  Service: Ophthalmology;  Laterality: Right;   PERIPHERAL VASCULAR INTERVENTION Right 12/16/2020   Procedure: PERIPHERAL VASCULAR INTERVENTION;  Surgeon: Sherren Kerns, MD;  Location: [facility] CV [facility];  Service: Cardiovascular;  Laterality: Right;  SFA (distal)   PHOTOCOAGULATION Right 05/26/2020   Procedure: PHOTOCOAGULATION;  Surgeon: Rennis Chris, MD;  Location: [facility] OR;  Service: Ophthalmology;  Laterality: Right;   PROSTATE BIOPSY  2015   RETINAL DETACHMENT SURGERY Right 05/06/2020   Pneumatic retinopexy for rheg RD repair - Dr. Rennis Chris   RETINAL DETACHMENT SURGERY Right 05/26/2020   PPV/MP - Dr. Rennis Chris    Current Outpatient Medications  Medication Sig Dispense Refill   ALPRAZolam (XANAX) 0.25 MG tablet Take 2 tablets (0.5 mg total) by mouth 2 (two) times daily as needed for anxiety. 90 tablet 1   aspirin EC 81 MG tablet Take 1 tablet (81 mg total) by mouth daily. 150 tablet 2   atorvastatin (LIPITOR) 40 MG tablet Take 1 tablet (40 mg total) by mouth daily. 90 tablet 3   clopidogrel (PLAVIX) 75 MG tablet Take 1 tablet (75 mg total) by mouth daily. 90 tablet 3   diclofenac Sodium (VOLTAREN) 1 % GEL Apply 1 application topically 3 (three) times daily as needed (leg cramps/spasms).     famotidine (PEPCID) 20 MG tablet Take 1 tablet (20 mg total) by mouth daily. 90 tablet 3   FLUoxetine (PROZAC) 40 MG capsule TAKE (1) CAPSULE BY MOUTH DAILY (Patient taking differently: Take 40 mg by mouth daily.) 90 capsule 3   furosemide (LASIX) 20 MG tablet Take 1 tablet (20 mg total) by mouth daily. 90 tablet 3   gabapentin (NEURONTIN) 100 MG capsule Take 1-3 capsules (100-300 mg total) by mouth at bedtime as needed. 90 capsule 1   HYDROcodone-acetaminophen (NORCO/VICODIN) 5-325 MG tablet Take 1-2 tablets by mouth every 6 (six) hours as needed for moderate pain. 12 tablet 0   irbesartan (AVAPRO) 300 MG tablet  Take 1 tablet (300 mg total) by mouth daily. 90 tablet 3   nitroGLYCERIN (NITROSTAT) 0.4 MG SL tablet Place 0.4 mg under the tongue every 5 (five) minutes as needed for chest pain.     pantoprazole (PROTONIX) 40 MG tablet Take 1 tablet (40 mg total) by mouth daily. Take 30-60 min before first meal of the day 30 tablet 2   tamsulosin (FLOMAX) 0.4 MG CAPS capsule Take 1 capsule (0.4 mg total) by mouth daily. 90 capsule 3   Tiotropium Bromide Monohydrate (SPIRIVA RESPIMAT) 2.5 MCG/ACT AERS Inhale 2 puffs into the lungs daily.     No current facility-administered medications for this visit.    No Known Allergies  Social History   Socioeconomic History   Marital status: Single    Spouse name: Not on file   Number of children: 2   Years of education: 12   Highest education level: Not on file  Occupational History   Occupation: Systems analyst   Occupation:  Unemployed    Comment: disability  Tobacco Use   Smoking status: Former    Current packs/day: 0.00    Average packs/day: 1.5 packs/day for 46.0 years (69.0 ttl pk-yrs)    Types: Cigarettes    Start date: 12/11/1964    Quit date: 12/12/2010    Years since quitting: 12.6   Smokeless tobacco: Never  Vaping Use   Vaping status: Never Used  Substance and Sexual Activity   Alcohol use: Yes    Alcohol/week: 4.0 standard drinks of alcohol    Types: 4 Cans of beer per week    Comment: a couple of beer on a weekend   Drug use: Not Currently    Types: Cocaine, Marijuana, "Crack" cocaine    Comment: over 25 years ago   Sexual activity: Not on file  Other Topics Concern   Not on file  Social History Narrative   Raised in [place], [place]. Does not have any religious beliefs that would effect healthcare. Lives in house with sister. Likes to ride motorcycle for fun.    Moving out on his own; in the next couple of weeks will be moving back home that he had rented      Motorcycles; with a helmet   Social Determinants of Health   Financial  Resource Strain: Low Risk  (09/17/2022)   Overall Financial Resource Strain (CARDIA)    Difficulty of Paying Living Expenses: Not hard at all  Food Insecurity: No Food Insecurity (09/17/2022)   Hunger Vital Sign    Worried About Running Out of Food in the Last Year: Never true    Ran Out of Food in the Last Year: Never true  Transportation Needs: No Transportation Needs (09/17/2022)   PRAPARE - Administrator, Civil Service (Medical): No    Lack of Transportation (Non-Medical): No  Physical Activity: Insufficiently Active (09/17/2022)   Exercise Vital Sign    Days of Exercise per Week: 3 days    Minutes of Exercise per Session: 30 min  Stress: Stress Concern Present (09/17/2022)   [employer] of Occupational Health - Occupational Stress Questionnaire    Feeling of Stress : To some extent  Social Connections: Socially Isolated (09/17/2022)   Social Connection and Isolation Panel [NHANES]    Frequency of Communication with Friends and Family: More than three times a week    Frequency of Social Gatherings with Friends and Family: More than three times a week    Attends Religious Services: Never    Database administrator or Organizations: No    Attends Banker Meetings: Never    Marital Status: Never married  Intimate Partner Violence: Not At Risk (09/17/2022)   Humiliation, Afraid, Rape, and Kick questionnaire    Fear of Current or Ex-Partner: No    Emotionally Abused: No    Physically Abused: No    Sexually Abused: No    Family History  Problem Relation Age of Onset   Hypertension Mother    Deep vein thrombosis Father    Diabetes Brother    Tuberculosis Maternal Grandfather    Emphysema Maternal Grandfather    Lung disease Brother    Asthma Son    Rectal cancer Neg Hx    Colon cancer Neg Hx     Review of Systems:  As stated in the HPI and otherwise negative.   BP 128/70   Pulse 81   Ht 5\' 11"  (1.803 m)   Wt 112.5 kg   SpO2 95%  BMI 34.59 kg/m    Physical Examination: General: Well developed, well nourished, NAD  HEENT: OP clear, mucus membranes moist  SKIN: warm, dry. No rashes. Neuro: No focal deficits  Musculoskeletal: Muscle strength 5/5 all ext  Psychiatric: Mood and affect normal  Neck: No JVD, no carotid bruits, no thyromegaly, no lymphadenopathy.  Lungs:Clear bilaterally, no wheezes, rhonci, crackles Cardiovascular: Regular rate and rhythm. No murmurs, gallops or rubs. Abdomen:Soft. Bowel sounds present. Non-tender.  Extremities: No lower extremity edema. Pulses are 2 + in the bilateral DP/PT.  EKG:  EKG is not ordered today. The ekg ordered today demonstrates   Recent Labs: 06/27/2023: BUN 17; Creatinine, Ser 0.68; Hemoglobin 14.1; Platelets 238.0; Potassium 4.4; Pro B Natriuretic peptide (BNP) 291.0; Sodium 138   Lipid Panel    Component Value Date/Time   CHOL 122 04/23/2022 0830   CHOL 137 06/11/2014 0000   TRIG 59.0 04/23/2022 0830   TRIG 173 06/11/2014 0000   HDL 50.30 04/23/2022 0830   CHOLHDL 2 04/23/2022 0830   VLDL 11.8 04/23/2022 0830   VLDL 35 06/11/2014 0000   LDLCALC 60 04/23/2022 0830     Wt Readings from Last 3 Encounters:  07/22/23 112.5 kg  07/15/23 111.7 kg  07/01/23 111.9 kg    Assessment and Plan:   1. CAD with angina: Mild CAD by cath in August 2022. No chest pain suggestive of angina. Continue ASA, Plavix and statin.  2. Chronic diastolic CHF: Recent mild volume overload with pulmonary edema and elevated BNP. Feeling better. Lungs clear on exam. Continue Lasix 20 mg daily. He will eat a banana every day for potassium supplementation. He will follow daily weights and take an extra 20 mg of Lasix for weight gain or dyspnea. Echo pending on 08/12/23.   3. Former Tobacco abuse: He stopped smoking in 2012.   4. Hyperlipidemia: Lipids followed in primary care. Continue statin.   5. HTN: BP is controlled. No changes today  6. PAD: Followed in [facility]. No carotid disease noted on  dopplers January 2024. He is on Plavix for his PAD.   7. LBBB: Chronic  Labs/ tests ordered today include:   No orders of the defined types were placed in this encounter.  Disposition:   F/U with me  in 12 months  Signed, Verne Carrow, MD 07/22/2023 4:35 PM    [facility] [address] [place], [place], [place]  [address] Phone: [phone]; Fax: [phone]

## 2023-07-22 NOTE — Patient Instructions (Addendum)
Medication Instructions:  No changes *If you need a refill on your cardiac medications before your next appointment, please call your pharmacy*   Lab Work: none  Testing/Procedures: Echo as planned   Follow-Up: At [facility], you and your health needs are our priority.  As part of our continuing mission to provide you with exceptional heart care, we have created designated Provider Care Teams.  These Care Teams include your primary Cardiologist (physician) and Advanced Practice Providers (APPs -  Physician Assistants and Nurse Practitioners) who all work together to provide you with the care you need, when you need it.   Your next appointment:   6 month(s)  Provider:   Verne Carrow, MD

## 2023-08-05 ENCOUNTER — Ambulatory Visit: Payer: PPO | Admitting: Pulmonary Disease

## 2023-08-05 ENCOUNTER — Encounter: Payer: Self-pay | Admitting: Pulmonary Disease

## 2023-08-05 VITALS — BP 154/87 | HR 84 | Ht 72.0 in | Wt 244.4 lb

## 2023-08-05 DIAGNOSIS — G4733 Obstructive sleep apnea (adult) (pediatric): Secondary | ICD-10-CM | POA: Diagnosis not present

## 2023-08-05 DIAGNOSIS — J449 Chronic obstructive pulmonary disease, unspecified: Secondary | ICD-10-CM | POA: Diagnosis not present

## 2023-08-05 MED ORDER — ALBUTEROL SULFATE HFA 108 (90 BASE) MCG/ACT IN AERS: 2.0000 | INHALATION_SPRAY | Freq: Four times a day (QID) | RESPIRATORY_TRACT | 6 refills | Status: AC | PRN

## 2023-08-05 MED ORDER — SPIRIVA RESPIMAT 2.5 MCG/ACT IN AERS: 2.0000 | INHALATION_SPRAY | Freq: Every day | RESPIRATORY_TRACT | 5 refills | Status: AC

## 2023-08-05 NOTE — Patient Instructions (Signed)
Follow-up in 6 weeks  Schedule you for an in lab titration study with your inspire device  Continue to go up on the settings on your inspire  Prescription for Spiriva and albuterol will be sent to pharmacy for you  Call us with significant concerns  Ensure that you continue to stay active

## 2023-08-05 NOTE — Progress Notes (Signed)
Todd Weeks    [id number]    March 19, 1956  Primary Care Physician:Parker, Katina Degree, MD  Referring Physician: Ardith Dark, MD [address] [place],  [place] [address]  Chief complaint:   Patient being seen for obstructive sleep apnea  HPI:  Patient with obstructive sleep apnea who is currently using an inspire device for management  Uses the device nightly Has noticed some improvement in energy levels, waking up feeling rested Sleeping more hours at night still not sleeping through the night wakes up a few times.  Reports Kunert hours of uninterrupted sleep  Finds it difficult to initiate sleep-takes him about an hour to fall asleep  Inspire device implantation 05/13/2023 Inspire device activation 07/01/2023  No reports of changes to his voice, no swallowing difficulty, no significant discomfort around implantation site  Works [employer] hours during the day about 12 hours about 6 days a week Shortness of breath has improved with being on diuretics, follows up with cardiologist Uses his inhalers    Outpatient Encounter Medications as of 08/05/2023  Medication Sig   albuterol (VENTOLIN HFA) 108 (90 Base) MCG/ACT inhaler Inhale 2 puffs into the lungs every 6 (six) hours as needed for wheezing or shortness of breath.   ALPRAZolam (XANAX) 0.25 MG tablet Take 2 tablets (0.5 mg total) by mouth 2 (two) times daily as needed for anxiety.   aspirin EC 81 MG tablet Take 1 tablet (81 mg total) by mouth daily.   atorvastatin (LIPITOR) 40 MG tablet Take 1 tablet (40 mg total) by mouth daily.   clopidogrel (PLAVIX) 75 MG tablet Take 1 tablet (75 mg total) by mouth daily.   diclofenac Sodium (VOLTAREN) 1 % GEL Apply 1 application topically 3 (three) times daily as needed (leg cramps/spasms).   famotidine (PEPCID) 20 MG tablet Take 1 tablet (20 mg total) by mouth daily.   FLUoxetine (PROZAC) 40 MG capsule TAKE (1) CAPSULE BY MOUTH DAILY (Patient taking differently: Take 40 mg by mouth  daily.)   furosemide (LASIX) 20 MG tablet Take 1 tablet (20 mg total) by mouth daily.   gabapentin (NEURONTIN) 100 MG capsule Take 1-3 capsules (100-300 mg total) by mouth at bedtime as needed.   HYDROcodone-acetaminophen (NORCO/VICODIN) 5-325 MG tablet Take 1-2 tablets by mouth every 6 (six) hours as needed for moderate pain.   irbesartan (AVAPRO) 300 MG tablet Take 1 tablet (300 mg total) by mouth daily.   nitroGLYCERIN (NITROSTAT) 0.4 MG SL tablet Place 0.4 mg under the tongue every 5 (five) minutes as needed for chest pain.   pantoprazole (PROTONIX) 40 MG tablet Take 1 tablet (40 mg total) by mouth daily. Take 30-60 min before first meal of the day   tamsulosin (FLOMAX) 0.4 MG CAPS capsule Take 1 capsule (0.4 mg total) by mouth daily.   Tiotropium Bromide Monohydrate (SPIRIVA RESPIMAT) 2.5 MCG/ACT AERS Inhale 2 puffs into the lungs daily.   Tiotropium Bromide Monohydrate (SPIRIVA RESPIMAT) 2.5 MCG/ACT AERS Inhale 2 puffs into the lungs daily.   No facility-administered encounter medications on file as of 08/05/2023.    Allergies as of 08/05/2023   (No Known Allergies)    Past Medical History:  Diagnosis Date   Bell's palsy    Cataract    Mixed form OU   Closed nondisplaced fracture of neck of third metacarpal bone of right hand 02/04/2017   Clotting disorder (HCC)    COPD (chronic obstructive pulmonary disease) (HCC)    Depression  Diverticulitis    GERD (gastroesophageal reflux disease)    Headache(784.0)    HTN (hypertension)    Hyperlipidemia    Hypertensive retinopathy    OU   Mild CAD 2012   PVD (peripheral vascular disease) (HCC)    Retinal detachment    Sexual dysfunction    Sleep apnea     Past Surgical History:  Procedure Laterality Date   ABDOMINAL AORTOGRAM W/LOWER EXTREMITY Bilateral 02/05/2020   Procedure: ABDOMINAL AORTOGRAM W/LOWER EXTREMITY;  Surgeon: Sherren Kerns, MD;  [facility];  Service: Cardiovascular;  Laterality:  Bilateral;   ABDOMINAL AORTOGRAM W/LOWER EXTREMITY N/A 12/16/2020   Procedure: ABDOMINAL AORTOGRAM W/LOWER EXTREMITY;  Surgeon: Sherren Kerns, MD;  Location: [facility];  Service: Cardiovascular;  Laterality: N/A;   CATARACT EXTRACTION     DRUG INDUCED ENDOSCOPY Right 01/15/2023   Procedure: DRUG INDUCED SLEEP  ENDOSCOPY;  Surgeon: Christia Reading, MD;  Location: [facility];  Service: ENT;  Laterality: Right;   EYE SURGERY Right 05/06/2020   Pneumatic retinopexy for rheg. RD repair - Dr. Rennis Chris   EYE SURGERY Right 05/26/2020   PPV/MP - Dr. Rennis Chris   GAS INSERTION Right 05/26/2020   Procedure: INSERTION OF GAS;  Surgeon: Rennis Chris, MD;  Location: [facility] OR;  Service: Ophthalmology;  Laterality: Right;   IMPLANTATION OF HYPOGLOSSAL NERVE STIMULATOR Right 05/13/2023   Procedure: IMPLANTATION OF HYPOGLOSSAL NERVE STIMULATOR;  Surgeon: Christia Reading, MD;  Location: [facility];  Service: ENT;  Laterality: Right;   INGUINAL HERNIA REPAIR Right 05/29/2021   Procedure: RIGHT INGUINAL HERNIA REPAIR WITH MESH;  Surgeon: Griselda Miner, MD;  Location: [facility];  Service: General;  Laterality: Right;  TAP BLOCK   LEFT HEART CATH AND CORONARY ANGIOGRAPHY N/A 04/05/2021   Procedure: LEFT HEART CATH AND CORONARY ANGIOGRAPHY;  Surgeon: Kathleene Hazel, MD;  [facility]: [facility];  Service: Cardiovascular;  Laterality: N/A;   MEMBRANE PEEL Right 05/26/2020   Procedure: MEMBRANE PEEL;  Surgeon: Rennis Chris, MD;  Location: [facility] OR;  Service: Ophthalmology;  Laterality: Right;   PARS PLANA VITRECTOMY Right 05/26/2020   Procedure: PARS PLANA VITRECTOMY WITH 25 GAUGE;  Surgeon: Rennis Chris, MD;  Location: [facility] OR;  Service: Ophthalmology;  Laterality: Right;   PERIPHERAL VASCULAR INTERVENTION Right 12/16/2020   Procedure: PERIPHERAL VASCULAR INTERVENTION;  Surgeon: Sherren Kerns, MD;  Location: [facility] LAB;  Service:  Cardiovascular;  Laterality: Right;  SFA (distal)   PHOTOCOAGULATION Right 05/26/2020   Procedure: PHOTOCOAGULATION;  Surgeon: Rennis Chris, MD;  Location: [facility] OR;  Service: Ophthalmology;  Laterality: Right;   PROSTATE BIOPSY  2015   RETINAL DETACHMENT SURGERY Right 05/06/2020   Pneumatic retinopexy for rheg RD repair - Dr. Rennis Chris   RETINAL DETACHMENT SURGERY Right 05/26/2020   PPV/MP - Dr. Rennis Chris    Family History  Problem Relation Age of Onset   Hypertension Mother    Deep vein thrombosis Father    Diabetes Brother    Tuberculosis Maternal Grandfather    Emphysema Maternal Grandfather    Lung disease Brother    Asthma Son    Rectal cancer Neg Hx    Colon cancer Neg Hx     Social History   Socioeconomic History   Marital status: Single    Spouse name: Not on file   Number of children: 2   Years of education: 12   Highest education level: Not on  file  Occupational History   Occupation: Systems analyst   Occupation: Unemployed    Comment: disability  Tobacco Use   Smoking status: Former    Current packs/day: 0.00    Average packs/day: 1.5 packs/day for 46.0 years (69.0 ttl pk-yrs)    Types: Cigarettes    Start date: 12/11/1964    Quit date: 12/12/2010    Years since quitting: 12.6   Smokeless tobacco: Never  Vaping Use   Vaping status: Never Used  Substance and Sexual Activity   Alcohol use: Yes    Alcohol/week: 4.0 standard drinks of alcohol    Types: 4 Cans of beer per week    Comment: a couple of beer on a weekend   Drug use: Not Currently    Types: Cocaine, Marijuana, "Crack" cocaine    Comment: over 25 years ago   Sexual activity: Not on file  Other Topics Concern   Not on file  Social History Narrative   Raised in [place], [place]. Does not have any religious beliefs that would effect healthcare. Lives in house with sister. Likes to ride motorcycle for fun.    Moving out on his own; in the next couple of weeks will be moving back home that he  had rented      [employer]; with a helmet   Social Drivers of Health   Financial Resource Strain: Low Risk  (09/17/2022)   Overall Financial Resource Strain (CARDIA)    Difficulty of Paying Living Expenses: Not hard at all  Food Insecurity: No Food Insecurity (09/17/2022)   Hunger Vital Sign    Worried About Running Out of Food in the Last Year: Never true    Ran Out of Food in the Last Year: Never true  Transportation Needs: No Transportation Needs (09/17/2022)   PRAPARE - Administrator, Civil Service (Medical): No    Lack of Transportation (Non-Medical): No  Physical Activity: Insufficiently Active (09/17/2022)   Exercise Vital Sign    Days of Exercise per Week: 3 days    Minutes of Exercise per Session: 30 min  Stress: Stress Concern Present (09/17/2022)   [employer] of Occupational Health - Occupational Stress Questionnaire    Feeling of Stress : To some extent  Social Connections: Socially Isolated (09/17/2022)   Social Connection and Isolation Panel [NHANES]    Frequency of Communication with Friends and Family: More than three times a week    Frequency of Social Gatherings with Friends and Family: More than three times a week    Attends Religious Services: Never    Database administrator or Organizations: No    Attends Banker Meetings: Never    Marital Status: Never married  Intimate Partner Violence: Not At Risk (09/17/2022)   Humiliation, Afraid, Rape, and Kick questionnaire    Fear of Current or Ex-Partner: No    Emotionally Abused: No    Physically Abused: No    Sexually Abused: No    Review of Systems  Respiratory:  Positive for apnea and shortness of breath.   Psychiatric/Behavioral:  Positive for sleep disturbance.     Vitals:   08/05/23 0845  BP: (!) 154/87  Pulse: 84  SpO2: 97%     Physical Exam Constitutional:      Appearance: He is obese.  HENT:     Head: Normocephalic.     Nose: Nose normal.  Eyes:     General: No  scleral icterus.    Pupils: Pupils are equal,  round, and reactive to light.  Cardiovascular:     Rate and Rhythm: Normal rate and regular rhythm.     Heart sounds: No murmur heard.    No friction rub.  Pulmonary:     Effort: No respiratory distress.     Breath sounds: No stridor. No wheezing or rhonchi.  Musculoskeletal:     Cervical back: No rigidity or tenderness.  Neurological:     Mental Status: He is alert.  Psychiatric:        Mood and Affect: Mood normal.      Data Reviewed: Inspire device testing was performed Device was tested at various levels Voltage ranged from 1.5-2.5 Start delay of 60 minutes with 7 duration, pause of 30 minutes  Pulmonary function test was reviewed -Significant for mild obstructive disease compared to previous studies  Assessment:  Mild obstructive lung disease -On Spiriva albuterol  Obstructive sleep apnea -Continue inspire device titration  Emphysema -Continue Spiriva  Plan/Recommendations:  Encouraged to continue titrating amplitude higher as tolerated on a weekly basis  Will schedule for his titration study at next visit  Mild obstructive lung disease -Continue Spiriva, albuterol as needed -Encouraged to continue regular exercises  Call us with significant concerns   Virl Diamond MD [facility] 08/05/2023, 12:56 PM  CC: Ardith Dark, MD

## 2023-08-06 ENCOUNTER — Ambulatory Visit (HOSPITAL_COMMUNITY): Payer: PPO | Attending: Cardiology

## 2023-08-06 DIAGNOSIS — R0602 Shortness of breath: Secondary | ICD-10-CM | POA: Diagnosis not present

## 2023-08-06 LAB — ECHOCARDIOGRAM COMPLETE
Area-P 1/2: 5.32 cm2
MV M vel: 4.43 m/s
MV Peak grad: 78.5 mm[Hg]
S' Lateral: 5.1 cm

## 2023-08-12 ENCOUNTER — Other Ambulatory Visit (HOSPITAL_COMMUNITY): Payer: PPO

## 2023-08-16 ENCOUNTER — Other Ambulatory Visit: Payer: Self-pay

## 2023-08-16 DIAGNOSIS — I739 Peripheral vascular disease, unspecified: Secondary | ICD-10-CM

## 2023-08-22 ENCOUNTER — Encounter: Payer: Self-pay | Admitting: Acute Care

## 2023-08-26 ENCOUNTER — Ambulatory Visit (INDEPENDENT_AMBULATORY_CARE_PROVIDER_SITE_OTHER)
Admission: RE | Admit: 2023-08-26 | Discharge: 2023-08-26 | Disposition: A | Discharge status: Home / Self Care | Payer: PPO | Source: Ambulatory Visit | Attending: Surgery | Admitting: Surgery

## 2023-08-26 ENCOUNTER — Ambulatory Visit (HOSPITAL_COMMUNITY)
Admission: RE | Admit: 2023-08-26 | Discharge: 2023-08-26 | Disposition: A | Discharge status: Home / Self Care | Payer: PPO | Source: Ambulatory Visit | Attending: Surgery | Admitting: Surgery

## 2023-08-26 ENCOUNTER — Ambulatory Visit (INDEPENDENT_AMBULATORY_CARE_PROVIDER_SITE_OTHER): Payer: PPO | Admitting: Physician Assistant

## 2023-08-26 VITALS — BP 117/81 | HR 71 | Temp 97.7°F | Resp 18 | Ht 72.0 in | Wt 245.3 lb

## 2023-08-26 DIAGNOSIS — M7989 Other specified soft tissue disorders: Secondary | ICD-10-CM

## 2023-08-26 DIAGNOSIS — I739 Peripheral vascular disease, unspecified: Secondary | ICD-10-CM

## 2023-08-26 DIAGNOSIS — I70219 Atherosclerosis of native arteries of extremities with intermittent claudication, unspecified extremity: Secondary | ICD-10-CM | POA: Diagnosis not present

## 2023-08-26 DIAGNOSIS — I83893 Varicose veins of bilateral lower extremities with other complications: Secondary | ICD-10-CM

## 2023-08-26 LAB — VAS US ABI WITH/WO TBI
Left ABI: 0.89
Right ABI: 1.08

## 2023-08-26 NOTE — Progress Notes (Signed)
 Office Note     CC:  follow up Requesting Provider:  Kennyth Worth HERO, MD  HPI: Todd Weeks is a 68 y.o. (1955/11/26) male who presents for surveillance of PAD.  He has history of right SFA stent by Dr. Harvey on 12/16/2020.  This was performed due to lifestyle limiting claudication.  He has remote history of right iliac stents from an outside facility.  He also has a known left SFA occlusion.  He has claudication symptoms in both legs when walking uphill however he is able to walk a flat road without symptoms.  He is on aspirin , Plavix , statin daily.  He is a former smoker.  He has bilateral lower extremity edema managed with compression stockings on a daily basis.  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  He recently underwent in the vagus nerve stimulator procedure by ENT for sleep apnea and states he has been getting much better sleep.   Past Medical History:  Diagnosis Date   Bell's palsy    Cataract    Mixed form OU   Closed nondisplaced fracture of neck of third metacarpal bone of right hand 02/04/2017   Clotting disorder (HCC)    COPD (chronic obstructive pulmonary disease) (HCC)    Depression    Diverticulitis    GERD (gastroesophageal reflux disease)    Headache(784.0)    HTN (hypertension)    Hyperlipidemia    Hypertensive retinopathy    OU   Mild CAD 2012   PVD (peripheral vascular disease) (HCC)    Retinal detachment    Sexual dysfunction    Sleep apnea     Past Surgical History:  Procedure Laterality Date   ABDOMINAL AORTOGRAM W/LOWER EXTREMITY Bilateral 02/05/2020   Procedure: ABDOMINAL AORTOGRAM W/LOWER EXTREMITY;  Surgeon: Harvey Carlin BRAVO, MD;  [facility];  Service: Cardiovascular;  Laterality: Bilateral;   ABDOMINAL AORTOGRAM W/LOWER EXTREMITY N/A 12/16/2020   Procedure: ABDOMINAL AORTOGRAM W/LOWER EXTREMITY;  Surgeon: Harvey Carlin BRAVO, MD;  [facility]: [facility];  Service: Cardiovascular;  Laterality: N/A;   CATARACT EXTRACTION      DRUG INDUCED ENDOSCOPY Right 01/15/2023   Procedure: DRUG INDUCED SLEEP  ENDOSCOPY;  Surgeon: Carlie Clark, MD;  Location: [facility];  Service: ENT;  Laterality: Right;   EYE SURGERY Right 05/06/2020   Pneumatic retinopexy for rheg. RD repair - Dr. Redell Hans   EYE SURGERY Right 05/26/2020   PPV/MP - Dr. Redell Hans   GAS INSERTION Right 05/26/2020   Procedure: INSERTION OF GAS;  Surgeon: Hans Redell, MD;  Location: [facility] OR;  Service: Ophthalmology;  Laterality: Right;   IMPLANTATION OF HYPOGLOSSAL NERVE STIMULATOR Right 05/13/2023   Procedure: IMPLANTATION OF HYPOGLOSSAL NERVE STIMULATOR;  Surgeon: Carlie Clark, MD;  Location: [facility];  Service: ENT;  Laterality: Right;   INGUINAL HERNIA REPAIR Right 05/29/2021   Procedure: RIGHT INGUINAL HERNIA REPAIR WITH MESH;  Surgeon: Curvin Deward MOULD, MD;  Location: [facility];  Service: General;  Laterality: Right;  TAP BLOCK   LEFT HEART CATH AND CORONARY ANGIOGRAPHY N/A 04/05/2021   Procedure: LEFT HEART CATH AND CORONARY ANGIOGRAPHY;  Surgeon: Verlin Lonni BIRCH, MD;  [facility];  Service: Cardiovascular;  Laterality: N/A;   MEMBRANE PEEL Right 05/26/2020   Procedure: MEMBRANE PEEL;  Surgeon: Hans Redell, MD;  Location: [facility] OR;  Service: Ophthalmology;  Laterality: Right;   PARS PLANA VITRECTOMY Right 05/26/2020   Procedure: PARS PLANA VITRECTOMY WITH  25 GAUGE;  Surgeon: Valdemar Rogue, MD;  Location: [facility] OR;  Service: Ophthalmology;  Laterality: Right;   PERIPHERAL VASCULAR INTERVENTION Right 12/16/2020   Procedure: PERIPHERAL VASCULAR INTERVENTION;  Surgeon: Harvey Carlin BRAVO, MD;  Location: [facility] IN[facility];  Service: Cardiovascular;  Laterality: Right;  SFA (distal)   PHOTOCOAGULATION Right 05/26/2020   Procedure: PHOTOCOAGULATION;  Surgeon: Valdemar Rogue, MD;  Location: [facility] OR;  Service: Ophthalmology;  Laterality: Right;   PROSTATE BIOPSY  2015   RETINAL DETACHMENT  SURGERY Right 05/06/2020   Pneumatic retinopexy for rheg RD repair - Dr. Rogue Valdemar   RETINAL DETACHMENT SURGERY Right 05/26/2020   PPV/MP - Dr. Rogue Valdemar    Social History   Socioeconomic History   Marital status: Single    Spouse name: Not on file   Number of children: 2   Years of education: 12   Highest education level: Not on file  Occupational History   Occupation: Systems Analyst   Occupation: Unemployed    Comment: disability  Tobacco Use   Smoking status: Former    Current packs/day: 0.00    Average packs/day: 1.5 packs/day for 46.0 years (69.0 ttl pk-yrs)    Types: Cigarettes    Start date: 12/11/1964    Quit date: 12/12/2010    Years since quitting: 12.7   Smokeless tobacco: Never  Vaping Use   Vaping status: Never Used  Substance and Sexual Activity   Alcohol use: Yes    Alcohol/week: 4.0 standard drinks of alcohol    Types: 4 Cans of beer per week    Comment: a couple of beer on a weekend   Drug use: Not Currently    Types: Cocaine, Marijuana, Crack cocaine    Comment: over 25 years ago   Sexual activity: Not on file  Other Topics Concern   Not on file  Social History Narrative   Raised in [place], [place]. Does not have any religious beliefs that would effect healthcare. Lives in house with sister. Likes to ride motorcycle for fun.    Moving out on his own; in the next couple of weeks will be moving back home that he had rented      [employer]; with a helmet   Social Drivers of Health   Financial Resource Strain: Low Risk  (09/17/2022)   Overall Financial Resource Strain (CARDIA)    Difficulty of Paying Living Expenses: Not hard at all  Food Insecurity: No Food Insecurity (09/17/2022)   Hunger Vital Sign    Worried About Running Out of Food in the Last Year: Never true    Ran Out of Food in the Last Year: Never true  Transportation Needs: No Transportation Needs (09/17/2022)   PRAPARE - Administrator, Civil Service (Medical): No     Lack of Transportation (Non-Medical): No  Physical Activity: Insufficiently Active (09/17/2022)   Exercise Vital Sign    Days of Exercise per Week: 3 days    Minutes of Exercise per Session: 30 min  Stress: Stress Concern Present (09/17/2022)   [employer] of Occupational Health - Occupational Stress Questionnaire    Feeling of Stress : To some extent  Social Connections: Socially Isolated (09/17/2022)   Social Connection and Isolation Panel [NHANES]    Frequency of Communication with Friends and Family: More than three times a week    Frequency of Social Gatherings with Friends and Family: More than three times a week    Attends Religious Services: Never    Active Member  of Clubs or Organizations: No    Attends Banker Meetings: Never    Marital Status: Never married  Intimate Partner Violence: Not At Risk (09/17/2022)   Humiliation, Afraid, Rape, and Kick questionnaire    Fear of Current or Ex-Partner: No    Emotionally Abused: No    Physically Abused: No    Sexually Abused: No    Family History  Problem Relation Age of Onset   Hypertension Mother    Deep vein thrombosis Father    Diabetes Brother    Tuberculosis Maternal Grandfather    Emphysema Maternal Grandfather    Lung disease Brother    Asthma Son    Rectal cancer Neg Hx    Colon cancer Neg Hx     Current Outpatient Medications  Medication Sig Dispense Refill   albuterol  (VENTOLIN  HFA) 108 (90 Base) MCG/ACT inhaler Inhale 2 puffs into the lungs every 6 (six) hours as needed for wheezing or shortness of breath. 8 g 6   aspirin  EC 81 MG tablet Take 1 tablet (81 mg total) by mouth daily. 150 tablet 2   atorvastatin  (LIPITOR) 40 MG tablet Take 1 tablet (40 mg total) by mouth daily. 90 tablet 3   clopidogrel  (PLAVIX ) 75 MG tablet Take 1 tablet (75 mg total) by mouth daily. 90 tablet 3   diclofenac  Sodium (VOLTAREN ) 1 % GEL Apply 1 application topically 3 (three) times daily as needed (leg cramps/spasms).      famotidine  (PEPCID ) 20 MG tablet Take 1 tablet (20 mg total) by mouth daily. 90 tablet 3   FLUoxetine  (PROZAC ) 40 MG capsule TAKE (1) CAPSULE BY MOUTH DAILY (Patient taking differently: Take 40 mg by mouth daily.) 90 capsule 3   furosemide  (LASIX ) 20 MG tablet Take 1 tablet (20 mg total) by mouth daily. 90 tablet 3   gabapentin  (NEURONTIN ) 100 MG capsule Take 1-3 capsules (100-300 mg total) by mouth at bedtime as needed. 90 capsule 1   irbesartan  (AVAPRO ) 300 MG tablet Take 1 tablet (300 mg total) by mouth daily. 90 tablet 3   nitroGLYCERIN  (NITROSTAT ) 0.4 MG SL tablet Place 0.4 mg under the tongue every 5 (five) minutes as needed for chest pain.     pantoprazole  (PROTONIX ) 40 MG tablet Take 1 tablet (40 mg total) by mouth daily. Take 30-60 min before first meal of the day 30 tablet 2   tamsulosin  (FLOMAX ) 0.4 MG CAPS capsule Take 1 capsule (0.4 mg total) by mouth daily. 90 capsule 3   Tiotropium Bromide Monohydrate  (SPIRIVA  RESPIMAT) 2.5 MCG/ACT AERS Inhale 2 puffs into the lungs daily.     Tiotropium Bromide Monohydrate  (SPIRIVA  RESPIMAT) 2.5 MCG/ACT AERS Inhale 2 puffs into the lungs daily. 1 g 5   ALPRAZolam  (XANAX ) 0.25 MG tablet Take 2 tablets (0.5 mg total) by mouth 2 (two) times daily as needed for anxiety. (Patient not taking: Reported on 08/26/2023) 90 tablet 1   HYDROcodone -acetaminophen  (NORCO/VICODIN) 5-325 MG tablet Take 1-2 tablets by mouth every 6 (six) hours as needed for moderate pain. (Patient not taking: Reported on 08/26/2023) 12 tablet 0   No current facility-administered medications for this visit.    No Known Allergies   REVIEW OF SYSTEMS:   [X]  denotes positive finding, [ ]  denotes negative finding Cardiac  Comments:  Chest pain or chest pressure:    Shortness of breath upon exertion:    Short of breath when lying flat:    Irregular heart rhythm:        Vascular  Pain in calf, thigh, or hip brought on by ambulation:    Pain in feet at night that wakes you up  from your sleep:     Blood clot in your veins:    Leg swelling:         Pulmonary    Oxygen at home:    Productive cough:     Wheezing:         Neurologic    Sudden weakness in arms or legs:     Sudden numbness in arms or legs:     Sudden onset of difficulty speaking or slurred speech:    Temporary loss of vision in one eye:     Problems with dizziness:         Gastrointestinal    Blood in stool:     Vomited blood:         Genitourinary    Burning when urinating:     Blood in urine:        Psychiatric    Major depression:         Hematologic    Bleeding problems:    Problems with blood clotting too easily:        Skin    Rashes or ulcers:        Constitutional    Fever or chills:      PHYSICAL EXAMINATION:  Vitals:   08/26/23 0849  BP: 117/81  Pulse: 71  Resp: 18  Temp: 97.7 F (36.5 C)  TempSrc: Temporal  SpO2: 92%  Weight: 245 lb 4.8 oz (111.3 kg)  Height: 6' (1.829 m)    General:  WDWN in NAD; vital signs documented above Gait: Not observed HENT: WNL, normocephalic Pulmonary: normal non-labored breathing , without Rales, rhonchi,  wheezing Cardiac: regular HR Abdomen: soft, NT, no masses Skin: without rashes Vascular Exam/Pulses: palpable R DP pulse; absent L pedal pulses Extremities: without ischemic changes, without Gangrene , without cellulitis; without open wounds;  Musculoskeletal: no muscle wasting or atrophy  Neurologic: A&O X 3 Psychiatric:  The pt has Normal affect.   Non-Invasive Vascular Imaging:    Widely patent right SFA stent  ABI/TBIToday's ABIToday's TBIPrevious ABIPrevious TBI  +-------+-----------+-----------+------------+------------+  Right 1.08       0.71       0.88        0.47          +-------+-----------+-----------+------------+------------+  Left  0.89       0.55       0.68        0.46          +-------+-----------+-----------+------------+------------+    ASSESSMENT/PLAN:: 68 y.o. male here  for follow up for surveillance of PAD with history of right SFA stent and known left SFA occlusion  Bilateral lower extremities are well-perfused without wounds.  Right SFA stent is widely patent by duplex.  He has a known left SFA occlusion.  Claudication symptoms are mild and only occur when walking uphill.  No indication for intervention of occluded left SFA at this time.  Continue aspirin , Plavix , statin daily.  Continue regular compression sock use to manage edema.  Repeat right lower extremity arterial duplex and ABI in 1 year.  Call or return to office sooner with questions or concerns.   Donnice Sender, PA-C [facility] [phone]  Clinic MD:   [place]

## 2023-08-27 ENCOUNTER — Other Ambulatory Visit: Payer: Self-pay | Admitting: Primary Care

## 2023-08-27 DIAGNOSIS — G4733 Obstructive sleep apnea (adult) (pediatric): Secondary | ICD-10-CM

## 2023-09-03 ENCOUNTER — Other Ambulatory Visit: Payer: Self-pay

## 2023-09-03 DIAGNOSIS — I739 Peripheral vascular disease, unspecified: Secondary | ICD-10-CM

## 2023-09-13 ENCOUNTER — Telehealth: Payer: PPO | Admitting: Primary Care

## 2023-09-25 ENCOUNTER — Encounter: Payer: Self-pay | Admitting: Gastroenterology

## 2023-10-31 ENCOUNTER — Ambulatory Visit (HOSPITAL_BASED_OUTPATIENT_CLINIC_OR_DEPARTMENT_OTHER): Payer: PPO | Admitting: Pulmonary Disease

## 2023-10-31 ENCOUNTER — Ambulatory Visit: Payer: PPO

## 2023-10-31 VITALS — Ht 72.0 in | Wt 245.0 lb

## 2023-10-31 DIAGNOSIS — F324 Major depressive disorder, single episode, in partial remission: Secondary | ICD-10-CM

## 2023-10-31 DIAGNOSIS — Z0000 Encounter for general adult medical examination without abnormal findings: Secondary | ICD-10-CM

## 2023-10-31 DIAGNOSIS — Z139 Encounter for screening, unspecified: Secondary | ICD-10-CM

## 2023-10-31 DIAGNOSIS — F419 Anxiety disorder, unspecified: Secondary | ICD-10-CM

## 2023-10-31 NOTE — Progress Notes (Addendum)
 Subjective:   Todd Weeks is a 68 y.o. who presents for a Medicare Wellness preventive visit.  Visit Complete: Virtual I connected with  Todd Weeks on 10/31/23 by a audio enabled telemedicine application and verified that I am speaking with the correct person using two identifiers.  Patient Location: Home  Provider Location: Office/Clinic  I discussed the limitations of evaluation and management by telemedicine. The patient expressed understanding and agreed to proceed.  Vital Signs: Because this visit was a virtual/telehealth visit, some criteria may be missing or patient reported. Any vitals not documented were not able to be obtained and vitals that have been documented are patient reported.  VideoDeclined- This patient declined Librarian, academic. Therefore the visit was completed with audio only.  Persons Participating in Visit: Patient.  AWV Questionnaire: Yes: Patient Medicare AWV questionnaire was completed by the patient on 10/30/23; I have confirmed that all information answered by patient is correct and no changes since this date.  Cardiac Risk Factors include: advanced age (>61men, >44 women);dyslipidemia;male gender;hypertension;obesity (BMI >30kg/m2)     Objective:    Today's Vitals   10/31/23 1533  Weight: 245 lb (111.1 kg)  Height: 6' (1.829 m)   Body mass index is 33.23 kg/m.     10/31/2023    3:37 PM 05/13/2023   10:39 AM 01/15/2023   10:23 AM 12/28/2022    3:32 PM 09/17/2022    8:08 AM 05/28/2022   10:34 AM 09/04/2021    8:11 AM  Advanced Directives  Does Patient Have a Medical Advance Directive? No No No No No No Yes  Type of Advance Directive       Healthcare Power of Attorney  Does patient want to make changes to medical advance directive?       Yes (MAU/Ambulatory/Procedural Areas - Information given)  Copy of Healthcare Power of Attorney in Chart?       No - copy requested  Would patient like information on creating a  medical advance directive? No - Patient declined No - Patient declined No - Patient declined  No - Patient declined No - Patient declined     Current Medications (verified) Outpatient Encounter Medications as of 10/31/2023  Medication Sig   albuterol (VENTOLIN HFA) 108 (90 Base) MCG/ACT inhaler Inhale 2 puffs into the lungs every 6 (six) hours as needed for wheezing or shortness of breath.   ALPRAZolam (XANAX) 0.25 MG tablet Take 2 tablets (0.5 mg total) by mouth 2 (two) times daily as needed for anxiety.   aspirin EC 81 MG tablet Take 1 tablet (81 mg total) by mouth daily.   atorvastatin (LIPITOR) 40 MG tablet Take 1 tablet (40 mg total) by mouth daily.   clopidogrel (PLAVIX) 75 MG tablet Take 1 tablet (75 mg total) by mouth daily.   diclofenac Sodium (VOLTAREN) 1 % GEL Apply 1 application topically 3 (three) times daily as needed (leg cramps/spasms).   famotidine (PEPCID) 20 MG tablet Take 1 tablet (20 mg total) by mouth daily.   FLUoxetine (PROZAC) 40 MG capsule TAKE (1) CAPSULE BY MOUTH DAILY (Patient taking differently: Take 40 mg by mouth daily.)   furosemide (LASIX) 20 MG tablet Take 1 tablet (20 mg total) by mouth daily.   gabapentin (NEURONTIN) 100 MG capsule Take 1-3 capsules (100-300 mg total) by mouth at bedtime as needed.   irbesartan (AVAPRO) 300 MG tablet Take 1 tablet (300 mg total) by mouth daily.   nitroGLYCERIN (NITROSTAT) 0.4 MG SL tablet  Place 0.4 mg under the tongue every 5 (five) minutes as needed for chest pain.   pantoprazole (PROTONIX) 40 MG tablet Take 1 tablet (40 mg total) by mouth daily. Take 30-60 min before first meal of the day   tamsulosin (FLOMAX) 0.4 MG CAPS capsule Take 1 capsule (0.4 mg total) by mouth daily.   Tiotropium Bromide Monohydrate (SPIRIVA RESPIMAT) 2.5 MCG/ACT AERS Inhale 2 puffs into the lungs daily.   [DISCONTINUED] HYDROcodone-acetaminophen (NORCO/VICODIN) 5-325 MG tablet Take 1-2 tablets by mouth every 6 (six) hours as needed for moderate  pain. (Patient not taking: Reported on 08/26/2023)   [DISCONTINUED] Tiotropium Bromide Monohydrate (SPIRIVA RESPIMAT) 2.5 MCG/ACT AERS Inhale 2 puffs into the lungs daily.   No facility-administered encounter medications on file as of 10/31/2023.    Allergies (verified) Patient has no known allergies.   History: Past Medical History:  Diagnosis Date   Bell's palsy    Cataract    Mixed form OU   Closed nondisplaced fracture of neck of third metacarpal bone of right hand 02/04/2017   Clotting disorder (HCC)    COPD (chronic obstructive pulmonary disease) (HCC)    Depression    Diverticulitis    GERD (gastroesophageal reflux disease)    Headache(784.0)    HTN (hypertension)    Hyperlipidemia    Hypertensive retinopathy    OU   Mild CAD 2012   PVD (peripheral vascular disease) (HCC)    Retinal detachment    Sexual dysfunction    Sleep apnea    Past Surgical History:  Procedure Laterality Date   ABDOMINAL AORTOGRAM W/LOWER EXTREMITY Bilateral 02/05/2020   Procedure: ABDOMINAL AORTOGRAM W/LOWER EXTREMITY;  Surgeon: Sherren Kerns, MD;  Location: [facility];  Service: Cardiovascular;  Laterality: Bilateral;   ABDOMINAL AORTOGRAM W/LOWER EXTREMITY N/A 12/16/2020   Procedure: ABDOMINAL AORTOGRAM W/LOWER EXTREMITY;  Surgeon: Sherren Kerns, MD;  Location: [facility];  Service: Cardiovascular;  Laterality: N/A;   CATARACT EXTRACTION     DRUG INDUCED ENDOSCOPY Right 01/15/2023   Procedure: DRUG INDUCED SLEEP  ENDOSCOPY;  Surgeon: Christia Reading, MD;  Location: [facility];  Service: ENT;  Laterality: Right;   EYE SURGERY Right 05/06/2020   Pneumatic retinopexy for rheg. RD repair - Dr. Rennis Chris   EYE SURGERY Right 05/26/2020   PPV/MP - Dr. Rennis Chris   GAS INSERTION Right 05/26/2020   Procedure: INSERTION OF GAS;  Surgeon: Rennis Chris, MD;  Location: [facility];  Service: Ophthalmology;  Laterality: Right;   IMPLANTATION OF HYPOGLOSSAL NERVE  STIMULATOR Right 05/13/2023   Procedure: IMPLANTATION OF HYPOGLOSSAL NERVE STIMULATOR;  Surgeon: Christia Reading, MD;  Location: [facility];  Service: ENT;  Laterality: Right;   INGUINAL HERNIA REPAIR Right 05/29/2021   Procedure: RIGHT INGUINAL HERNIA REPAIR WITH MESH;  Surgeon: Griselda Miner, MD;  Location:  SURGERY CENTER;  Service: General;  Laterality: Right;  TAP BLOCK   LEFT HEART CATH AND CORONARY ANGIOGRAPHY N/A 04/05/2021   Procedure: LEFT HEART CATH AND CORONARY ANGIOGRAPHY;  Surgeon: Kathleene Hazel, MD;  Location: [facility];  Service: Cardiovascular;  Laterality: N/A;   MEMBRANE PEEL Right 05/26/2020   Procedure: MEMBRANE PEEL;  Surgeon: Rennis Chris, MD;  Location: [facility] OR;  Service: Ophthalmology;  Laterality: Right;   PARS PLANA VITRECTOMY Right 05/26/2020   Procedure: PARS PLANA VITRECTOMY WITH 25 GAUGE;  Surgeon: Rennis Chris, MD;  Location: [facility] OR;  Service: Ophthalmology;  Laterality: Right;   PERIPHERAL VASCULAR  INTERVENTION Right 12/16/2020   Procedure: PERIPHERAL VASCULAR INTERVENTION;  Surgeon: Sherren Kerns, MD;  Location: [facility] INVASIVE CV LAB;  Service: Cardiovascular;  Laterality: Right;  SFA (distal)   PHOTOCOAGULATION Right 05/26/2020   Procedure: PHOTOCOAGULATION;  Surgeon: Rennis Chris, MD;  Location: [facility] OR;  Service: Ophthalmology;  Laterality: Right;   PROSTATE BIOPSY  2015   RETINAL DETACHMENT SURGERY Right 05/06/2020   Pneumatic retinopexy for rheg RD repair - Dr. Rennis Chris   RETINAL DETACHMENT SURGERY Right 05/26/2020   PPV/MP - Dr. Rennis Chris   Family History  Problem Relation Age of Onset   Hypertension Mother    Deep vein thrombosis Father    Diabetes Brother    Tuberculosis Maternal Grandfather    Emphysema Maternal Grandfather    Lung disease Brother    Asthma Son    Rectal cancer Neg Hx    Colon cancer Neg Hx    Social History   Socioeconomic History   Marital status: Single    Spouse name:  Not on file   Number of children: 2   Years of education: 12   Highest education level: 11th grade  Occupational History   Occupation: Systems analyst   Occupation: Unemployed    Comment: disability  Tobacco Use   Smoking status: Former    Current packs/day: 0.00    Average packs/day: 1.5 packs/day for 46.0 years (69.0 ttl pk-yrs)    Types: Cigarettes    Start date: 12/11/1964    Quit date: 12/12/2010    Years since quitting: 12.8   Smokeless tobacco: Never  Vaping Use   Vaping status: Never Used  Substance and Sexual Activity   Alcohol use: Yes    Alcohol/week: 4.0 standard drinks of alcohol    Types: 4 Cans of beer per week    Comment: a couple of beer on a weekend   Drug use: Not Currently    Types: Cocaine, Marijuana, "Crack" cocaine    Comment: over 25 years ago   Sexual activity: Not on file  Other Topics Concern   Not on file  Social History Narrative   Raised in [place], [place]. Does not have any religious beliefs that would effect healthcare. Lives in house with sister. Likes to ride motorcycle for fun.    Moving out on his own; in the next couple of weeks will be moving back home that he had rented      Motorcycles; with a helmet   Social Drivers of Health   Financial Resource Strain: Medium Risk (10/30/2023)   Overall Financial Resource Strain (CARDIA)    Difficulty of Paying Living Expenses: Somewhat hard  Food Insecurity: No Food Insecurity (10/30/2023)   Hunger Vital Sign    Worried About Running Out of Food in the Last Year: Never true    Ran Out of Food in the Last Year: Never true  Transportation Needs: No Transportation Needs (10/30/2023)   PRAPARE - Administrator, Civil Service (Medical): No    Lack of Transportation (Non-Medical): No  Physical Activity: Insufficiently Active (10/31/2023)   Exercise Vital Sign    Days of Exercise per Week: 3 days    Minutes of Exercise per Session: 30 min  Stress: Stress Concern Present (10/30/2023)    [employer] of Occupational Health - Occupational Stress Questionnaire    Feeling of Stress : To some extent  Social Connections: Socially Isolated (10/30/2023)   Social Connection and Isolation Panel [NHANES]    Frequency of Communication with  Friends and Family: Never    Frequency of Social Gatherings with Friends and Family: Never    Attends Religious Services: Never    Database administrator or Organizations: No    Attends Engineer, structural: Not on file    Marital Status: Never married    Tobacco Counseling Counseling given: Not Answered    Clinical Intake:  Pre-visit preparation completed: Yes  Pain : No/denies pain     BMI - recorded: 33.23 Nutritional Status: BMI > 30  Obese Nutritional Risks: None Diabetes: No  Lab Results  Component Value Date   HGBA1C 5.6 04/23/2022   HGBA1C 5.9 02/18/2020   HGBA1C 5.4 03/23/2019     How often do you need to have someone help you when you read instructions, pamphlets, or other written materials from your doctor or pharmacy?: 1 - Never  Interpreter Needed?: No  Information entered by :: Lanier Ensign, LPN   Activities of Daily Living     10/31/2023    3:34 PM 05/13/2023   10:43 AM  In your present state of health, do you have any difficulty performing the following activities:  Hearing? 0 0  Vision? 0 0  Difficulty concentrating or making decisions? 0 0  Walking or climbing stairs? 0   Dressing or bathing? 0   Doing errands, shopping? 0   Preparing Food and eating ? N   Using the Toilet? N   In the past six months, have you accidently leaked urine? N   Do you have problems with loss of bowel control? N   Managing your Medications? N   Managing your Finances? N   Housekeeping or managing your Housekeeping? N     Patient Care Team: Ardith Dark, MD as PCP - General (Family Medicine) Kathleene Hazel, MD as PCP - Cardiology (Cardiology) Kathleene Hazel, MD as Consulting  Physician (Cardiology) Sherren Kerns, MD (Inactive) as Consulting Physician (Vascular Surgery) Waymon Budge, MD as Consulting Physician (Pulmonary Disease) Marcine Matar, MD as Consulting Physician (Urology)  Indicate any recent Medical Services you may have received from other than [facility] providers in the past year (date may be approximate).     Assessment:   This is a routine wellness examination for [place].  Hearing/Vision screen Hearing Screening - Comments:: Pt denies any hearing issues  Vision Screening - Comments:: Pt follow up with dr Vanessa Barbara for annual eye exams    Goals Addressed             This Visit's Progress    Patient Stated       Lose weight        Depression Screen     10/31/2023    3:37 PM 04/03/2023    7:43 AM 04/03/2023    7:41 AM 09/17/2022    8:06 AM 08/27/2022   10:04 AM 04/23/2022    7:28 AM 09/04/2021    8:08 AM  PHQ 2/9 Scores  PHQ - 2 Score 2 0 0 1 0 2 2  PHQ- 9 Score 7 0  2 2 9 4     Fall Risk     10/31/2023    3:41 PM 06/27/2023    8:37 AM 04/03/2023    7:41 AM 09/17/2022    8:10 AM 09/04/2021    8:13 AM  Fall Risk   Falls in the past year? 0 1 0 0 0  Number falls in past yr: 0 0 0 0 0  Injury with Fall? 0  0 0 0  Risk for fall due to : No Fall Risks  No Fall Risks Impaired vision Impaired vision;Impaired balance/gait  Risk for fall due to: Comment     at times with balance  Follow up Falls prevention discussed   Falls prevention discussed Falls prevention discussed    MEDICARE RISK AT HOME:  Medicare Risk at Home Any stairs in or around the home?: No If so, are there any without handrails?: No Home free of loose throw rugs in walkways, pet beds, electrical cords, etc?: Yes Adequate lighting in your home to reduce risk of falls?: Yes Life alert?: No Use of a cane, walker or w/c?: No Grab bars in the bathroom?: Yes Shower chair or bench in shower?: Yes Elevated toilet seat or a handicapped toilet?: Yes  TIMED UP AND  GO:  Was the test performed?  No  Cognitive Function: 6CIT completed    12/13/2014    8:41 AM  MMSE - Mini Mental State Exam  Not completed: Unable to complete        10/31/2023    3:42 PM 09/17/2022    8:11 AM 09/04/2021    8:15 AM 07/25/2020    8:30 AM  6CIT Screen  What Year? 0 points 0 points 0 points 0 points  What month? 0 points 0 points 0 points 0 points  What time? 0 points 0 points 0 points   Count back from 20 0 points 0 points 0 points 0 points  Months in reverse 0 points 4 points 4 points 4 points  Repeat phrase 0 points 0 points 0 points   Total Score 0 points 4 points 4 points     Immunizations Immunization History  Administered Date(s) Administered   Fluad Quad(high Dose 65+) 04/12/2023   Influenza Split 05/24/2009, 05/14/2015   Influenza, High Dose Seasonal PF 04/26/2019   Influenza,inj,Quad PF,6+ Mos 08/02/2016, 06/16/2017, 06/12/2018, 04/26/2019   Influenza-Unspecified 06/16/2017, 05/21/2020, 06/06/2021   Meningococcal Conjugate 04/16/2022   PFIZER Comirnaty(Gray Top)Covid-19 Tri-Sucrose Vaccine 11/28/2020   PFIZER(Purple Top)SARS-COV-2 Vaccination 10/22/2019, 11/11/2019, 03/27/2020, 05/12/2020, 06/06/2021, 06/18/2023   PNEUMOCOCCAL CONJUGATE-20 04/16/2022   Pneumococcal Polysaccharide-23 08/04/2018   Pneumococcal-Unspecified 04/16/2022   Respiratory Syncytial Virus Vaccine,Recomb Aduvanted(Arexvy) 04/16/2022   Rsv, Bivalent, Protein Subunit Rsvpref,pf Verdis Frederickson) 04/16/2022   Tdap 12/13/2014   Zoster Recombinant(Shingrix) 05/21/2020, 08/21/2020    Screening Tests Health Maintenance  Topic Date Due   Lung Cancer Screening  10/09/2022   COVID-19 Vaccine (8 - 2024-25 season) 08/13/2023   Colonoscopy  10/07/2023   Medicare Annual Wellness (AWV)  10/30/2024   DTaP/Tdap/Td (2 - Td or Tdap) 12/12/2024   Pneumonia Vaccine 50+ Years old  Completed   INFLUENZA VACCINE  Completed   Hepatitis C Screening  Completed   Zoster Vaccines- Shingrix  Completed    HPV VACCINES  Aged Out    Health Maintenance  Health Maintenance Due  Topic Date Due   Lung Cancer Screening  10/09/2022   COVID-19 Vaccine (8 - 2024-25 season) 08/13/2023   Colonoscopy  10/07/2023   Health Maintenance Items Addressed: See Nurse Notes  Additional Screening:  Vision Screening: Recommended annual ophthalmology exams for early detection of glaucoma and other disorders of the eye.  Dental Screening: Recommended annual dental exams for proper oral hygiene  Community Resource Referral / Chronic Care Management: CRR required this visit?  No   CCM required this visit?  No     Plan:     I have personally reviewed and noted the following in the  patient's chart:   Medical and social history Use of alcohol, tobacco or illicit drugs  Current medications and supplements including opioid prescriptions. Patient is not currently taking opioid prescriptions. Functional ability and status Nutritional status Physical activity Advanced directives List of other physicians Hospitalizations, surgeries, and ER visits in previous 12 months Vitals Screenings to include cognitive, depression, and falls Referrals and appointments  In addition, I have reviewed and discussed with patient certain preventive protocols, quality metrics, and best practice recommendations. A written personalized care plan for preventive services as well as general preventive health recommendations were provided to patient.     Marzella Schlein, LPN   2/44/0102   After Visit Summary: (MyChart) Due to this being a telephonic visit, the after visit summary with patients personalized plan was offered to patient via MyChart   Notes:colonoscopy and lung cancer screening placed along with referral for depression and anxiety concerns

## 2023-10-31 NOTE — Patient Instructions (Signed)
 Todd Weeks , Thank you for taking time to come for your Medicare Wellness Visit. I appreciate your ongoing commitment to your health goals. Please review the following plan we discussed and let me know if I can assist you in the future.   Referrals/Orders/Follow-Ups/Clinician Recommendations: continue to work on losing weight   This is a list of the screening recommended for you and due dates:  Health Maintenance  Topic Date Due   Screening for Lung Cancer  10/09/2022   COVID-19 Vaccine (8 - 2024-25 season) 08/13/2023   Medicare Annual Wellness Visit  09/18/2023   Colon Cancer Screening  10/07/2023   DTaP/Tdap/Td vaccine (2 - Td or Tdap) 12/12/2024   Pneumonia Vaccine  Completed   Flu Shot  Completed   Hepatitis C Screening  Completed   Zoster (Shingles) Vaccine  Completed   HPV Vaccine  Aged Out    Advanced directives: (Declined) Advance directive discussed with you today. Even though you declined this today, please call our office should you change your mind, and we can give you the proper paperwork for you to fill out.  Next Medicare Annual Wellness Visit scheduled for next year: Yes

## 2023-11-01 ENCOUNTER — Telehealth: Payer: Self-pay | Admitting: *Deleted

## 2023-11-01 NOTE — Progress Notes (Signed)
 Complex Care Management Note  Care Guide Note 11/01/2023 Name: Todd Weeks MRN: [id number] DOB: 08/01/1956  Todd Weeks is a 68 y.o. year old male who sees Ardith Dark, MD for primary care. I reached out to Todd Weeks by phone today to offer complex care management services.  Todd Weeks was given information about Complex Care Management services today including:   The Complex Care Management services include support from the care team which includes your Nurse Care Manager, Clinical Social Worker, or Pharmacist.  The Complex Care Management team is here to help remove barriers to the health concerns and goals most important to you. Complex Care Management services are voluntary, and the patient may decline or stop services at any time by request to their care team member.   Complex Care Management Consent Status: Patient agreed to services and verbal consent obtained.   Follow up plan:  Telephone appointment with complex care management team member scheduled for:  11/11/2023  Encounter Outcome:  Patient Scheduled  Burman Nieves, CMA, Care Guide [facility]  [facility], [facility] Guide Direct Dial: [phone]  Fax: [phone] Website: [email or web address]

## 2023-11-11 ENCOUNTER — Ambulatory Visit: Payer: Self-pay | Admitting: Licensed Clinical Social Worker

## 2023-11-11 NOTE — Patient Instructions (Signed)
 Visit Information  Thank you for taking time to visit with me today. Please don't hesitate to contact me if I can be of assistance to you.   Following are the goals we discussed today:   Goals Addressed             This Visit's Progress    Reduce symptoms of anxiety/Depression       Care Coordination Interventions: Assessed Social Determinants of Health Reviewed all upcoming appointments in Epic system Motivational Interviewing employed Depression screen reviewed  PHQ2/ PHQ9 completed Solution-Focused Strategies employed:  Active listening / Reflection utilized  Emotional Support Provided Behavioral Activation reviewed Problem Solving /Task Center strategies reviewed Provided general psycho-education for mental health needs  Look out for [facility] resources being sent in the mail          Our next appointment is by telephone on 12/09/2023.  Please call the care guide team at [phone] if you need to cancel or reschedule your appointment.   If you are experiencing a Mental Health or Behavioral Health Crisis or need someone to talk to, please call the Suicide and Crisis Lifeline: 988  Patient verbalizes understanding of instructions and care plan provided today and agrees to view in MyChart. Active MyChart status and patient understanding of how to access instructions and care plan via MyChart confirmed with patient.     Telephone follow up appointment with care management team member scheduled for: 12/09/2023

## 2023-11-11 NOTE — Patient Outreach (Signed)
 Care Coordination   Initial Visit Note   11/11/2023 Name: GERMAN MANKE MRN: [id number] DOB: March 10, 1956  Kelle Darting Darrah is a 68 y.o. year old male who sees Jimmey Ralph, Katina Degree, MD for primary care. I spoke with  Kelle Darting Jesus by phone today.  What matters to the patients health and wellness today?  Being able to manage my stress better.    Goals Addressed             This Visit's Progress    Reduce symptoms of anxiety/Depression       Care Coordination Interventions: Assessed Social Determinants of Health Reviewed all upcoming appointments in Epic system Motivational Interviewing employed Depression screen reviewed  PHQ2/ PHQ9 completed Solution-Focused Strategies employed:  Active listening / Reflection utilized  Emotional Support Provided Behavioral Activation reviewed Problem Solving /Task Center strategies reviewed Provided general psycho-education for mental health needs  Look out for [facility] resources being sent in the mail          SDOH assessments and interventions completed:  Yes  SDOH Interventions Today    Flowsheet Row Most Recent Value  SDOH Interventions   Depression Interventions/Treatment  Counseling        Care Coordination Interventions:  Yes, provided   Interventions Today    Flowsheet Row Most Recent Value  Chronic Disease   Chronic disease during today's visit Other  [Depression/Anxiety]  General Interventions   General Interventions Discussed/Reviewed General Interventions Discussed  [Pt reported he wants to sell his home due to financial constraints. He currently has a Regulatory affairs officer but is trying to find motivation to finish packing to he can start selling. Pt works full time as a chef.]  Mental Health Interventions   Mental Health Discussed/Reviewed Mental Health Discussed, Mental Health Reviewed, Coping Strategies, Anxiety, Depression  [Pt reported feeling isolated and having low motivation, stress regarding finances and depressive symptoms. Pt would  like to reach out to family more and sell his home. Discussed behavioral activation and motivation. Also reviewed his coping skills.]        Follow up plan: No further intervention required.   Encounter Outcome:  Patient Visit Completed   Kenton Kingfisher, LCSW [facility], [facility] Licensed Clinical Social Worker Care Coordinator [phone]

## 2023-11-14 ENCOUNTER — Other Ambulatory Visit: Payer: Self-pay

## 2023-11-14 DIAGNOSIS — Z122 Encounter for screening for malignant neoplasm of respiratory organs: Secondary | ICD-10-CM

## 2023-11-14 DIAGNOSIS — Z87891 Personal history of nicotine dependence: Secondary | ICD-10-CM

## 2023-11-15 ENCOUNTER — Encounter: Payer: Self-pay | Admitting: Gastroenterology

## 2023-11-18 ENCOUNTER — Ambulatory Visit

## 2023-11-19 ENCOUNTER — Encounter: Payer: Self-pay | Admitting: Primary Care

## 2023-12-09 ENCOUNTER — Encounter: Payer: Self-pay | Admitting: Licensed Clinical Social Worker

## 2023-12-19 ENCOUNTER — Other Ambulatory Visit: Payer: Self-pay | Admitting: Licensed Clinical Social Worker

## 2023-12-19 NOTE — Patient Instructions (Signed)
 Visit Information  Thank you for taking time to visit with me today. Please don't hesitate to contact me if I can be of assistance to you before our next scheduled appointment.   Patient has met all care management goals. Care Management case will be closed. Patient has been provided contact information should new needs arise.   Please call the care guide team at [phone] if you need to cancel, schedule, or reschedule an appointment.   Please call the Suicide and Crisis Lifeline: 988 if you are experiencing a Mental Health or Behavioral Health Crisis or need someone to talk to.  Kenton Kingfisher, LCSW [facility], [facility] Licensed Clinical Social Worker Care Coordinator [phone]

## 2023-12-19 NOTE — Patient Outreach (Signed)
 Complex Care Management   Visit Note  12/19/2023  Name:  Todd Weeks MRN: [id number] DOB: 10/11/1955  Situation: Referral received for Complex Care Management related to [facility]/Behavioral Health diagnosis MDD I obtained verbal consent from Patient.  Visit completed with patient  on the phone  Background:   Past Medical History:  Diagnosis Date   Bell's palsy    Cataract    Mixed form OU   Closed nondisplaced fracture of neck of third metacarpal bone of right hand 02/04/2017   Clotting disorder (HCC)    COPD (chronic obstructive pulmonary disease) (HCC)    Depression    Diverticulitis    GERD (gastroesophageal reflux disease)    Headache(784.0)    HTN (hypertension)    Hyperlipidemia    Hypertensive retinopathy    OU   Mild CAD 2012   PVD (peripheral vascular disease) (HCC)    Retinal detachment    Sexual dysfunction    Sleep apnea     Assessment: Patient Reported Symptoms:  Cognitive Cognitive Status: Alert and oriented to person, place, and time, Insightful and able to interpret abstract concepts, Normal speech and language skills Cognitive/Intellectual Conditions Management [RPT]: None reported or documented in medical history or problem list   Health Maintenance Behaviors: Annual physical exam, Stress management, Hobbies Healing Pattern: Unsure Health Facilitated by: Stress management  Neurological Neurological Review of Symptoms: No symptoms reported Neurological Management Strategies: Activity, Coping strategies, Counseling Neurological Self-Management Outcome: 3 (uncertain)  HEENT HEENT Symptoms Reported: No symptoms reported HEENT Management Strategies: Activity, Coping strategies, Medication therapy    Cardiovascular Cardiovascular Symptoms Reported: No symptoms reported Does patient have uncontrolled Hypertension?: No Cardiovascular Conditions: Heart failure Cardiovascular Management Strategies: Activity, Medication therapy Cardiovascular Self-Management  Outcome: 3 (uncertain)  Respiratory Respiratory Symptoms Reported: No symptoms reported Other Respiratory Symptoms: Denied any concerns Respiratory Conditions: Sleep disordered breathing  Endocrine Patient reports the following symptoms related to hypoglycemia or hyperglycemia : No symptoms reported    Gastrointestinal Gastrointestinal Symptoms Reported: No symptoms reported Gastrointestinal Management Strategies: Activity Nutrition Risk Screen (CP): No indicators present  Genitourinary Genitourinary Symptoms Reported: No symptoms reported    Integumentary Integumentary Symptoms Reported: No symptoms reported    Musculoskeletal Musculoskelatal Symptoms Reviewed: No symptoms reported Musculoskeletal Management Strategies: Activity, Coping strategies, Medication therapy Musculoskeletal Self-Management Outcome: 4 (good) Falls in the past year?: No Number of falls in past year: 1 or less Was there an injury with Fall?: No Fall Risk Category Calculator: 0 Patient Fall Risk Level: Low Fall Risk    Psychosocial Psychosocial Symptoms Reported: Depression - if selected complete PHQ 2-9 Additional Psychological Details: Pt reviewed MH materials and 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 Pt is reaching out to family more often and concentrating on work. Denied any further interventions at this time. Behavioral Health Conditions: Depression Behavioral Management Strategies: Activity, Coping strategies, Counseling, Support system Behavioral Health Self-Management Outcome: 4 (good) Major Change/Loss/Stressor/Fears (CP): Death of a loved one, Medical condition, self Techniques to Cope with Loss/Stress/Change: Diversional activities, Withdraw Quality of Family Relationships: helpful, involved Do you feel physically threatened by others?: No      11/11/2023    9:41 AM  Depression screen PHQ 2/9  Decreased Interest 3  Down, Depressed, Hopeless 2  PHQ - 2 Score 5  Altered sleeping 2  Tired,  decreased energy 1  Change in appetite 0  Feeling bad or failure about yourself  2  Trouble concentrating 1  Moving slowly or fidgety/restless 0  Suicidal thoughts 0  PHQ-9 Score 11  Difficult doing work/chores Very difficult    There were no vitals filed for this visit.  Medications Reviewed Today   Medications were not reviewed in this encounter     Recommendation:   Continue to monitor for symptoms and utilize coping skills as needed. Utilize family supports and reach out if you would like to reconnect.  Follow Up Plan:   Patient has met all care management goals. Care Management case will be closed. Patient has been provided contact information should new needs arise.   Hale Level, LCSW [place], [facility] Licensed Clinical Social Worker Care Coordinator [phone]

## 2024-01-14 ENCOUNTER — Encounter (HOSPITAL_BASED_OUTPATIENT_CLINIC_OR_DEPARTMENT_OTHER): Payer: Self-pay

## 2024-01-14 ENCOUNTER — Encounter (HOSPITAL_BASED_OUTPATIENT_CLINIC_OR_DEPARTMENT_OTHER): Admitting: Pulmonary Disease

## 2024-01-20 ENCOUNTER — Other Ambulatory Visit: Payer: Self-pay | Admitting: Pulmonary Disease

## 2024-01-20 ENCOUNTER — Other Ambulatory Visit: Payer: Self-pay | Admitting: Family Medicine

## 2024-01-20 NOTE — Telephone Encounter (Signed)
 Please advise pt request for controlled substance medication. Xanax  0.25 mg BID PRN. LOV 08-05-23 with Dr Gaynell Keeler. Last order dated for 05-07-23

## 2024-01-20 NOTE — Telephone Encounter (Signed)
 Last OV 09/03/22 Next OV not scheduled  Has not been seen in > 1 year, needs OV. Please reach out to assist with scheduling.

## 2024-01-21 ENCOUNTER — Telehealth: Payer: Self-pay | Admitting: Primary Care

## 2024-01-21 NOTE — Telephone Encounter (Signed)
 Per Jerlene Moody,   Phone encounter to remind the patient that they canceled their previous titration study. They are keeping their follow-up appointment on June 30th @8 :30am to check in, regroup, and ensure everything is on track at this time with his Inspire device. Patient is now scheduled for titration on August 11th. Please let me know if anything further is needed from me, thank you.

## 2024-01-22 IMAGING — CT CT CHEST LUNG CANCER SCREENING LOW DOSE W/O CM
1 series · 10 of 10 positions shown, 13 images · non-contrast
Comparison: Multiple priors, most recently cardiac CT 03/20/2021.
Low-dose lung cancer screening chest CT 08/22/2020.

CLINICAL DATA: 66-year-old male former smoker (quit 11 years ago)
with 41 pack-year history of smoking. Lung cancer screening
examination.



[ct lung segmentation data · axial · 0.83mm/px · z∈[-357,-357]mm · 10 of 332 frames shown]
[frame 1/332  mediastinal]
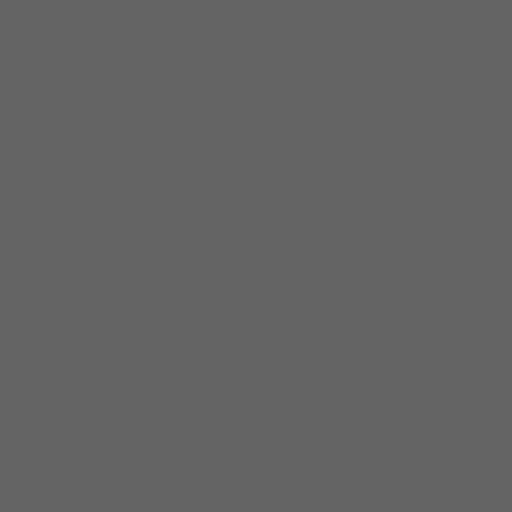
[frame 1/332  lung]
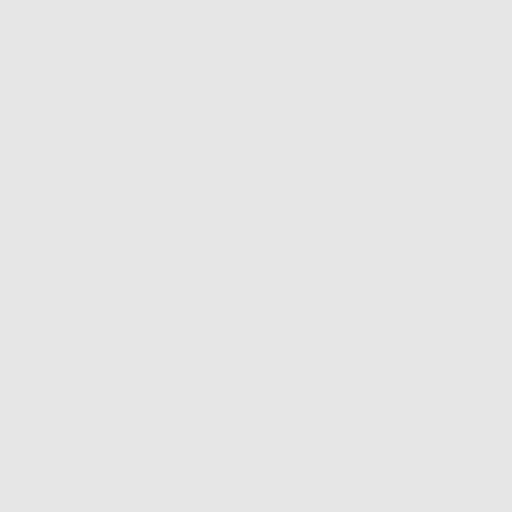
[frame 37/332  lung]
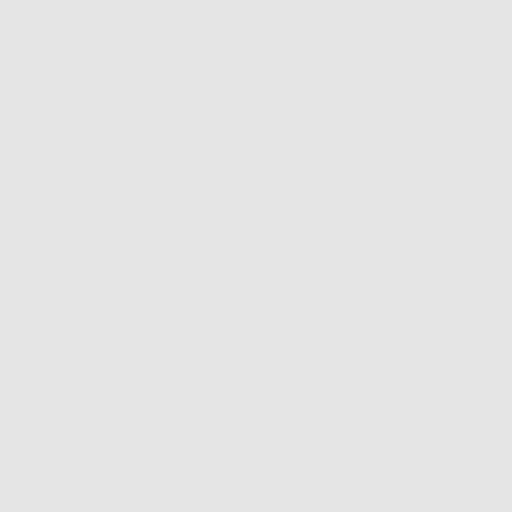
[frame 74/332  lung]
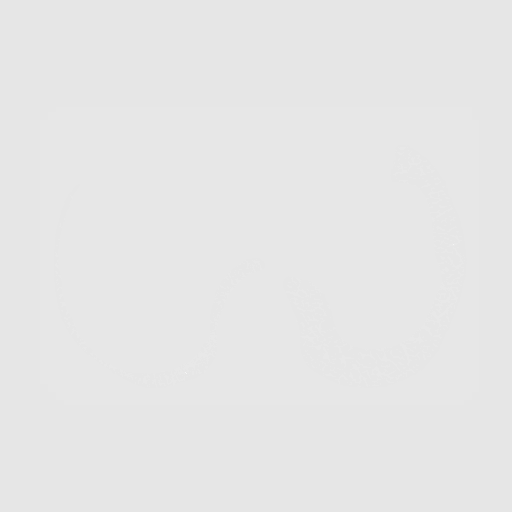
[frame 111/332  lung]
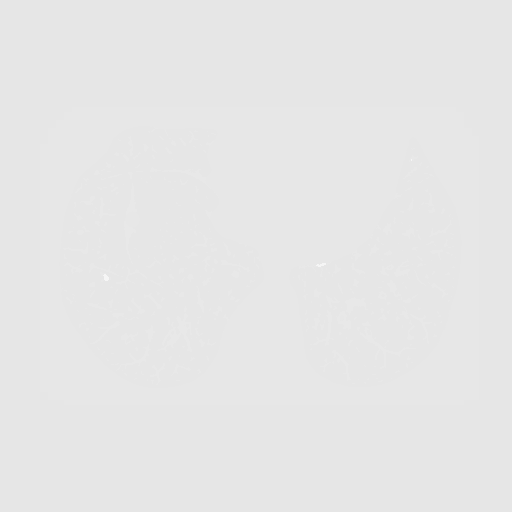
[frame 148/332  mediastinal]
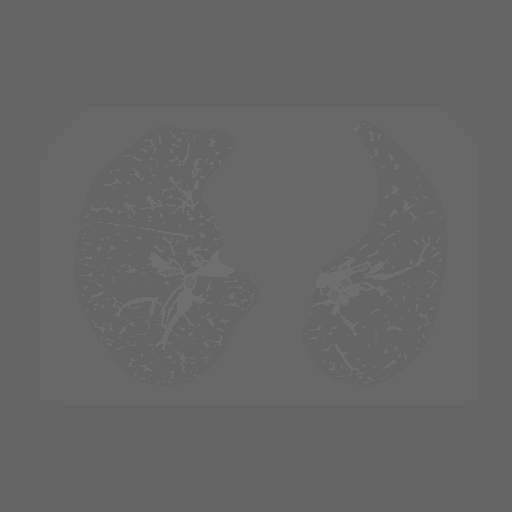
[frame 148/332  lung]
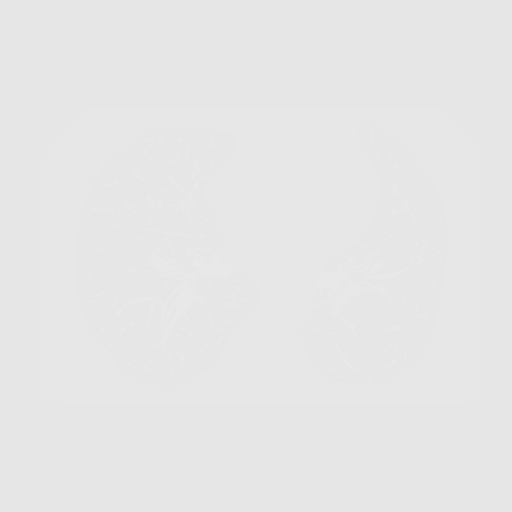
[frame 184/332  lung]
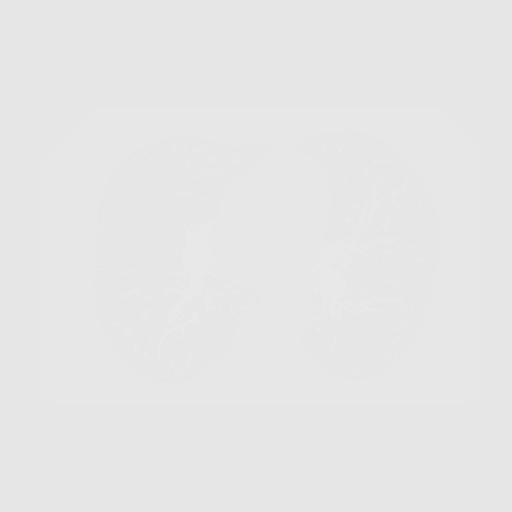
[frame 221/332  lung]
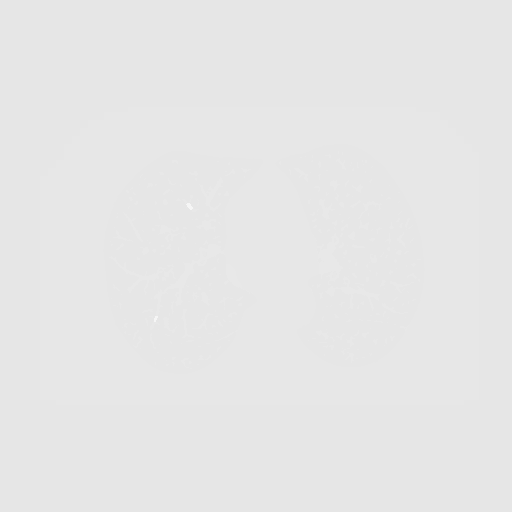
[frame 258/332  lung]
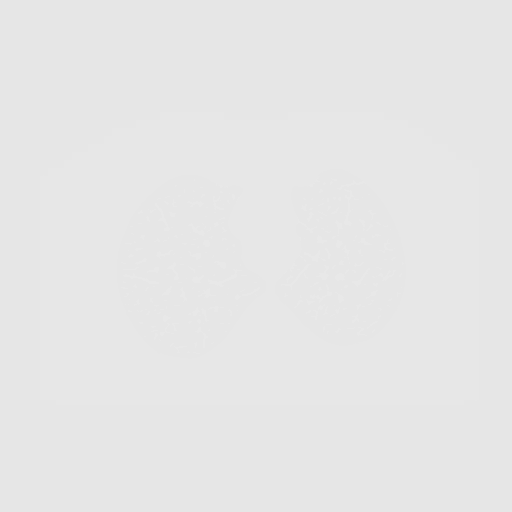
[frame 295/332  mediastinal]
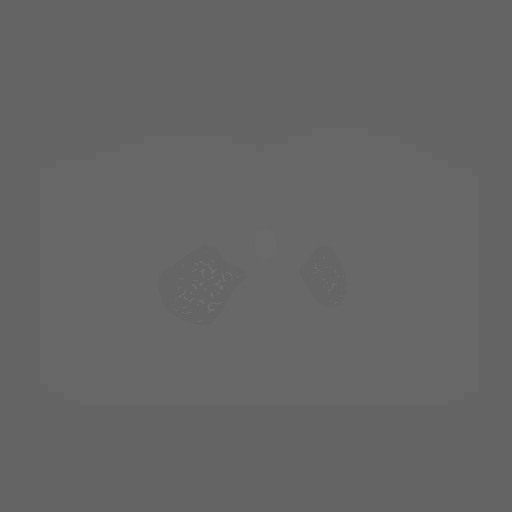
[frame 295/332  lung]
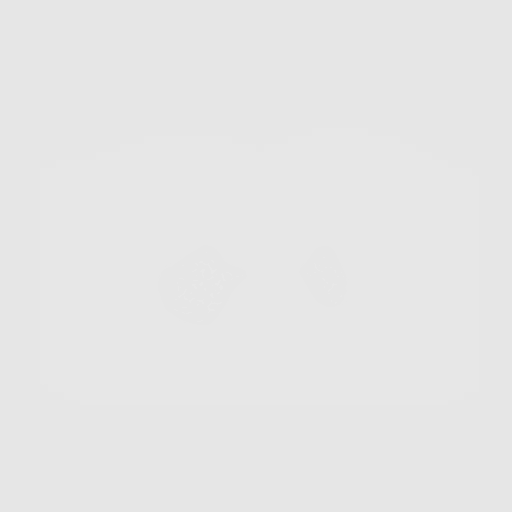
[frame 332/332  lung]
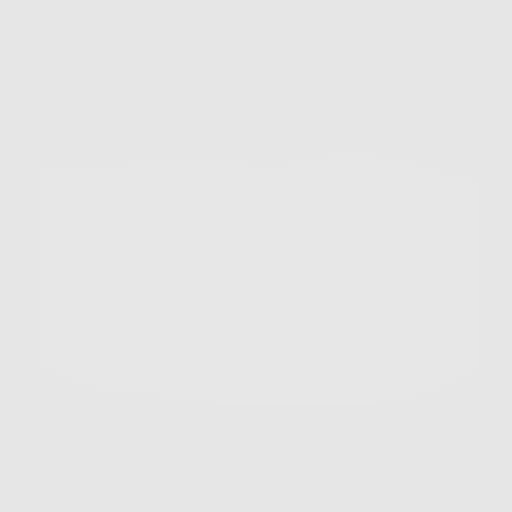

[10 of 10 positions shown; findings below may reference images not displayed]

FINDINGS: Cardiovascular: Heart size is normal. There is no significant
pericardial fluid, thickening or pericardial calcification. There is
aortic atherosclerosis, as well as atherosclerosis of the great
vessels of the mediastinum and the coronary arteries, including
calcified atherosclerotic plaque in the left main, left anterior
descending, left circumflex and right coronary arteries.

Mediastinum/Nodes: No pathologically enlarged mediastinal or hilar
lymph nodes. Please note that accurate exclusion of hilar adenopathy
is limited on noncontrast CT scans. Esophagus is unremarkable in
appearance. No axillary lymphadenopathy.

Lungs/Pleura: Multiple small pulmonary nodules are scattered
throughout the lungs, largest of which is in the medial aspect of
the right lower lobe (axial image 184 of series 3), with a volume
derived mean diameter 5.1 mm, stable compared to the prior
examination. No larger more suspicious appearing pulmonary nodules
or masses are noted. No acute consolidative airspace disease. No
pleural effusions. Mild diffuse bronchial wall thickening with mild
centrilobular and paraseptal emphysema.

Upper Abdomen: Aortic atherosclerosis.

Musculoskeletal: There are no aggressive appearing lytic or blastic
lesions noted in the visualized portions of the skeleton.
IMPRESSION: 1. Lung-RADS 2S, benign appearance or behavior. Continue annual
screening with low-dose chest CT without contrast in 12 months.
2. The "S" modifier above refers to potentially clinically
significant non lung cancer related findings. Specifically, there is
aortic atherosclerosis, in addition to left main and three-vessel
coronary artery disease. Please note that although the presence of
coronary artery calcium documents the presence of coronary artery
disease, the severity of this disease and any potential stenosis
cannot be assessed on this non-gated CT examination. Assessment for
potential risk factor modification, dietary therapy or pharmacologic
therapy may be warranted, if clinically indicated.
3. Mild diffuse bronchial wall thickening with mild centrilobular
and paraseptal emphysema; imaging findings suggestive of underlying
COPD.

Aortic Atherosclerosis (HAVTD-LD9.9) and Emphysema (HAVTD-J5Q.K).

## 2024-01-23 NOTE — Telephone Encounter (Signed)
 Sleep sync report 12/24/23-01/22/24 Usage 30/30 days; days used > 4 hours 30/30 days (100%) Average usage 7 hours 58 mins Average pause per night 0.1 Current amplitude 2.2V (upper limit 2.5- lower limit 1.5)

## 2024-01-27 ENCOUNTER — Encounter: Payer: Self-pay | Admitting: Gastroenterology

## 2024-01-27 ENCOUNTER — Ambulatory Visit: Admitting: Gastroenterology

## 2024-01-27 VITALS — BP 132/74 | HR 74 | Ht 72.0 in | Wt 234.6 lb

## 2024-01-27 DIAGNOSIS — Z860100 Personal history of colon polyps, unspecified: Secondary | ICD-10-CM | POA: Diagnosis not present

## 2024-01-27 DIAGNOSIS — Q438 Other specified congenital malformations of intestine: Secondary | ICD-10-CM | POA: Diagnosis not present

## 2024-01-27 DIAGNOSIS — I2510 Atherosclerotic heart disease of native coronary artery without angina pectoris: Secondary | ICD-10-CM | POA: Diagnosis not present

## 2024-01-27 DIAGNOSIS — Z7902 Long term (current) use of antithrombotics/antiplatelets: Secondary | ICD-10-CM

## 2024-01-27 DIAGNOSIS — I5022 Chronic systolic (congestive) heart failure: Secondary | ICD-10-CM | POA: Diagnosis not present

## 2024-01-27 DIAGNOSIS — M25561 Pain in right knee: Secondary | ICD-10-CM | POA: Diagnosis not present

## 2024-01-27 DIAGNOSIS — G8929 Other chronic pain: Secondary | ICD-10-CM | POA: Diagnosis not present

## 2024-01-27 NOTE — Patient Instructions (Signed)
 _______________________________________________________  If your blood pressure at your visit was 140/90 or greater, please contact your primary care physician to follow up on this.  _______________________________________________________  If you are age 69 or older, your body mass index should be between 23-30. Your Body mass index is 31.82 kg/m. If this is out of the aforementioned range listed, please consider follow up with your Primary Care Provider.  If you are age 69 or younger, your body mass index should be between 19-25. Your Body mass index is 31.82 kg/m. If this is out of the aformentioned range listed, please consider follow up with your Primary Care Provider.   ________________________________________________________  The [facility] providers would like to encourage you to use MYCHART to communicate with providers for non-urgent requests or questions.  Due to [facility] hold times on the telephone, sending your provider a message by [facility] may be a faster and more efficient way to get a response.  Please allow 48 business hours for a response.  Please remember that this is for non-urgent requests.  _______________________________________________________

## 2024-01-27 NOTE — Progress Notes (Signed)
 [facility] Consult Note:  History: Todd Weeks 01/27/2024  Referring provider: Rodney Clamp, MD  Reason for consult/chief complaint: Colonoscopy (No problems. Patient on Plavix )   Subjective  Prior history:  Screening colonoscopy with Dr. Dominic Friendly February 2020 -subcentimeter tubular adenoma and SSP.  Tortuous left colon, needs pediatric colonoscope Plavix  was held 5 days prior to procedure (managed by cardiology for CAD and PAD)  ________________________  Todd Weeks is here today to discuss a surveillance colonoscopy and for evaluation of his cardiovascular and pulmonary conditions in advance of that. He is not currently having any digestive symptoms including dysphagia, frequent heartburn, nausea vomiting loss of appetite or weight loss.  Bowel habits are regular without rectal bleeding. He was more dyspneic toward the end of last year and saw his pulmonary physician regarding his sleep apnea and underwent placement of an inspire device.  When seen cardiology early December 2024, testing revealed pulmonary edema and he was placed on Lasix .  Echocardiogram done showing decline in LVEF from previous echo (see report below).  Talk about his heart failure, Yonatan does not feel that he has good understanding of what the echo results meant or what the [place]-term prognosis and treatment plan will be other than diuretics. 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  He does not get chest pain when doing those activities.  He has lately had more trouble with arthritis in his right knee and will see his orthopedist this week as well.  He had some shots in his left shoulder within the last several months as well.  ROS:  Review of Systems  Constitutional:  Negative for appetite change and unexpected weight change.  HENT:  Negative for mouth sores and voice change.   Eyes:  Negative for pain and redness.  Respiratory:  Positive  for shortness of breath. Negative for cough.   Cardiovascular:  Negative for chest pain and palpitations.  Genitourinary:  Negative for dysuria and hematuria.  Musculoskeletal:  Positive for arthralgias. Negative for myalgias.  Skin:  Negative for pallor and rash.  Neurological:  Negative for weakness and headaches.  Hematological:  Negative for adenopathy.     Past Medical History: Past Medical History:  Diagnosis Date   Bell's palsy    Cataract    Mixed form OU   Closed nondisplaced fracture of neck of third metacarpal bone of right hand 02/04/2017   Clotting disorder (HCC)    COPD (chronic obstructive pulmonary disease) (HCC)    Depression    Diverticulitis    GERD (gastroesophageal reflux disease)    Headache(784.0)    HTN (hypertension)    Hyperlipidemia    Hypertensive retinopathy    OU   Mild CAD 2012   PVD (peripheral vascular disease) (HCC)    Retinal detachment    Sexual dysfunction    Sleep apnea      Past Surgical History: Past Surgical History:  Procedure Laterality Date   ABDOMINAL AORTOGRAM W/LOWER EXTREMITY Bilateral 02/05/2020   Procedure: ABDOMINAL AORTOGRAM W/LOWER EXTREMITY;  Surgeon: Richrd Char, MD;  Location: [facility];  Service: Cardiovascular;  Laterality: Bilateral;   ABDOMINAL AORTOGRAM W/LOWER EXTREMITY N/A 12/16/2020   Procedure: ABDOMINAL AORTOGRAM W/LOWER EXTREMITY;  Surgeon: Richrd Char, MD;  Location: [facility];  Service: Cardiovascular;  Laterality: N/A;   CATARACT EXTRACTION     DRUG INDUCED ENDOSCOPY Right 01/15/2023   Procedure: DRUG INDUCED SLEEP  ENDOSCOPY;  Surgeon: Virgina Grills, MD;  Location: [facility];  Service: ENT;  Laterality: Right;   EYE SURGERY Right 05/06/2020   Pneumatic retinopexy for rheg. RD repair - Dr. Ronelle Coffee   EYE SURGERY Right 05/26/2020   PPV/MP - Dr. Ronelle Coffee   GAS INSERTION Right 05/26/2020   Procedure: INSERTION OF GAS;  Surgeon: Ronelle Coffee, MD;   Location: [facility];  Service: Ophthalmology;  Laterality: Right;   IMPLANTATION OF HYPOGLOSSAL NERVE STIMULATOR Right 05/13/2023   Procedure: IMPLANTATION OF HYPOGLOSSAL NERVE STIMULATOR;  Surgeon: Virgina Grills, MD;  Location: [facility];  Service: ENT;  Laterality: Right;   INGUINAL HERNIA REPAIR Right 05/29/2021   Procedure: RIGHT INGUINAL HERNIA REPAIR WITH MESH;  Surgeon: Caralyn Chandler, MD;  Location: [facility];  Service: General;  Laterality: Right;  TAP BLOCK   LEFT HEART CATH AND CORONARY ANGIOGRAPHY N/A 04/05/2021   Procedure: LEFT HEART CATH AND CORONARY ANGIOGRAPHY;  Surgeon: Odie Benne, MD;  Location: [facility];  Service: Cardiovascular;  Laterality: N/A;   MEMBRANE PEEL Right 05/26/2020   Procedure: MEMBRANE PEEL;  Surgeon: Ronelle Coffee, MD;  Location: [facility] OR;  Service: Ophthalmology;  Laterality: Right;   PARS PLANA VITRECTOMY Right 05/26/2020   Procedure: PARS PLANA VITRECTOMY WITH 25 GAUGE;  Surgeon: Ronelle Coffee, MD;  Location: [facility] OR;  Service: Ophthalmology;  Laterality: Right;   PERIPHERAL VASCULAR INTERVENTION Right 12/16/2020   Procedure: PERIPHERAL VASCULAR INTERVENTION;  Surgeon: Richrd Char, MD;  Location: [facility];  Service: Cardiovascular;  Laterality: Right;  SFA (distal)   PHOTOCOAGULATION Right 05/26/2020   Procedure: PHOTOCOAGULATION;  Surgeon: Ronelle Coffee, MD;  Location: [facility] OR;  Service: Ophthalmology;  Laterality: Right;   PROSTATE BIOPSY  2015   RETINAL DETACHMENT SURGERY Right 05/06/2020   Pneumatic retinopexy for rheg RD repair - Dr. Ronelle Coffee   RETINAL DETACHMENT SURGERY Right 05/26/2020   PPV/MP - Dr. Ronelle Coffee     Family History: Family History  Problem Relation Age of Onset   Hypertension Mother    Deep vein thrombosis Father    Diabetes Brother    Tuberculosis Maternal Grandfather    Emphysema Maternal Grandfather    Lung disease Brother    Asthma Son    Rectal cancer  Neg Hx    Colon cancer Neg Hx     Social History: Social History   Socioeconomic History   Marital status: Single    Spouse name: Not on file   Number of children: 2   Years of education: 12   Highest education level: 11th grade  Occupational History   Occupation: Systems analyst   Occupation: Unemployed    Comment: disability  Tobacco Use   Smoking status: Former    Current packs/day: 0.00    Average packs/day: 1.5 packs/day for 46.0 years (69.0 ttl pk-yrs)    Types: Cigarettes    Start date: 12/11/1964    Quit date: 12/12/2010    Years since quitting: 13.1   Smokeless tobacco: Never  Vaping Use   Vaping status: Never Used  Substance and Sexual Activity   Alcohol use: Yes    Alcohol/week: 4.0 standard drinks of alcohol    Types: 4 Cans of beer per week    Comment: a couple of beer on a weekend   Drug use: Not Currently    Types: Cocaine, Marijuana, Crack cocaine    Comment: over 25 years ago   Sexual activity: Not  on file  Other Topics Concern   Not on file  Social History Narrative   Raised in [place], [place]. Does not have any religious beliefs that would effect healthcare. Lives in house with sister. Likes to ride motorcycle for fun.    Moving out on his own; in the next couple of weeks will be moving back home that he had rented      Motorcycles; with a helmet   Social Drivers of Health   Financial Resource Strain: Medium Risk (10/30/2023)   Overall Financial Resource Strain (CARDIA)    Difficulty of Paying Living Expenses: Somewhat hard  Food Insecurity: No Food Insecurity (10/30/2023)   Hunger Vital Sign    Worried About Running Out of Food in the Last Year: Never true    Ran Out of Food in the Last Year: Never true  Transportation Needs: No Transportation Needs (10/30/2023)   PRAPARE - Administrator, Civil Service (Medical): No    Lack of Transportation (Non-Medical): No  Physical Activity: Insufficiently Active (10/31/2023)   Exercise Vital  Sign    Days of Exercise per Week: 3 days    Minutes of Exercise per Session: 30 min  Stress: Stress Concern Present (11/11/2023)   [employer] of Occupational Health - Occupational Stress Questionnaire    Feeling of Stress : Very much  Social Connections: Socially Isolated (10/30/2023)   Social Connection and Isolation Panel    Frequency of Communication with Friends and Family: Never    Frequency of Social Gatherings with Friends and Family: Never    Attends Religious Services: Never    Database administrator or Organizations: No    Attends Engineer, structural: Not on file    Marital Status: Never married    Allergies: No Known Allergies  Outpatient Meds: Current Outpatient Medications  Medication Sig Dispense Refill   albuterol  (VENTOLIN  HFA) 108 (90 Base) MCG/ACT inhaler Inhale 2 puffs into the lungs every 6 (six) hours as needed for wheezing or shortness of breath. 8 g 6   ALPRAZolam  (XANAX ) 0.25 MG tablet Take 2 tablets (0.5 mg total) by mouth 2 (two) times daily as needed for anxiety. 90 tablet 1   aspirin  EC 81 MG tablet Take 1 tablet (81 mg total) by mouth daily. 150 tablet 2   atorvastatin  (LIPITOR) 40 MG tablet Take 1 tablet (40 mg total) by mouth daily. 90 tablet 3   clopidogrel  (PLAVIX ) 75 MG tablet Take 1 tablet (75 mg total) by mouth daily. 90 tablet 3   diclofenac  Sodium (VOLTAREN ) 1 % GEL Apply 1 application topically 3 (three) times daily as needed (leg cramps/spasms).     famotidine  (PEPCID ) 20 MG tablet Take 1 tablet (20 mg total) by mouth daily. 90 tablet 3   FLUoxetine  (PROZAC ) 40 MG capsule TAKE (1) CAPSULE BY MOUTH DAILY (Patient taking differently: Take 40 mg by mouth daily.) 90 capsule 3   furosemide  (LASIX ) 20 MG tablet Take 1 tablet (20 mg total) by mouth daily. 90 tablet 3   gabapentin  (NEURONTIN ) 100 MG capsule Take 1-3 capsules (100-300 mg total) by mouth at bedtime as needed. 90 capsule 1   irbesartan  (AVAPRO ) 300 MG tablet Take 1 tablet  (300 mg total) by mouth daily. 90 tablet 3   nitroGLYCERIN  (NITROSTAT ) 0.4 MG SL tablet Place 0.4 mg under the tongue every 5 (five) minutes as needed for chest pain.     pantoprazole  (PROTONIX ) 40 MG tablet Take 1 tablet (40 mg total) by  mouth daily. Take 30-60 min before first meal of the day 30 tablet 2   tamsulosin  (FLOMAX ) 0.4 MG CAPS capsule Take 1 capsule (0.4 mg total) by mouth daily. 90 capsule 3   Tiotropium Bromide Monohydrate  (SPIRIVA  RESPIMAT) 2.5 MCG/ACT AERS Inhale 2 puffs into the lungs daily. 1 g 5   No current facility-administered medications for this visit.      ___________________________________________________________________ Objective   Exam:  BP 132/74   Pulse 74   Ht 6' (1.829 m)   Wt 234 lb 9.6 oz (106.4 kg)   BMI 31.82 kg/m  Wt Readings from Last 3 Encounters:  01/27/24 234 lb 9.6 oz (106.4 kg)  10/31/23 245 lb (111.1 kg)  08/26/23 245 lb 4.8 oz (111.3 kg)   Note weight is down in last few months (?  Diuresis) General: Well-appearing, ambulatory, breathing comfortably on room air gets on exam table without difficulty Eyes: sclera anicteric, no redness ENT: oral mucosa moist without lesions, no cervical or supraclavicular lymphadenopathy CV: Regular without appreciable murmur, no JVD, no peripheral edema.  Varicose veins in the legs Resp: clear to auscultation bilaterally, normal RR and effort noted GI: soft, no tenderness, with active bowel sounds. No guarding or palpable organomegaly noted. Skin; warm and dry, no rash or jaundice noted Neuro: awake, alert and oriented x 3. Normal gross motor function and fluent speech   Labs:     Latest Ref Rng & Units 06/27/2023    9:40 AM 04/23/2022    8:30 AM 03/30/2021    8:56 AM  CBC  WBC 4.0 - 10.5 K/uL 7.3  7.8  6.6   Hemoglobin 13.0 - 17.0 g/dL 19.1  47.8  29.5   Hematocrit 39.0 - 52.0 % 43.1  42.4  42.3   Platelets 150.0 - 400.0 K/uL 238.0  241.0  213.0       Latest Ref Rng & Units 06/27/2023     9:40 AM 04/23/2022    8:30 AM 03/30/2021    8:56 AM  CMP  Glucose 70 - 99 mg/dL 621  93  95   BUN 6 - 23 mg/dL 17  20  17    Creatinine 0.40 - 1.50 mg/dL 3.08  6.57  8.46   Sodium 135 - 145 mEq/L 138  139  137   Potassium 3.5 - 5.1 mEq/L 4.4  4.7  4.6   Chloride 96 - 112 mEq/L 103  103  103   CO2 19 - 32 mEq/L 24  28  26    Calcium  8.4 - 10.5 mg/dL 9.5  9.5  9.2   Total Protein 6.0 - 8.3 g/dL  6.8  6.9   Total Bilirubin 0.2 - 1.2 mg/dL  0.6  0.6   Alkaline Phos 39 - 117 U/L  88  74   AST 0 - 37 U/L  19  18   ALT 0 - 53 U/L  26  29      Radiologic Studies:  IMPRESSIONS     1. Left ventricular ejection fraction, by estimation, is 25 to 30%. The  left ventricle has severely decreased function. The left ventricle  demonstrates global hypokinesis. The left ventricular internal cavity size  was severely dilated. Left ventricular  diastolic parameters are consistent with Grade II diastolic dysfunction  (pseudonormalization).   2. Right ventricular systolic function is mildly reduced. The right  ventricular size is normal.   3. The mitral valve is normal in structure. Moderate mitral valve  regurgitation. No evidence of mitral stenosis.  4. The aortic valve is tricuspid. Aortic valve regurgitation is not  visualized. Aortic valve sclerosis is present, with no evidence of aortic  valve stenosis.   5. The inferior vena cava is normal in size with greater than 50%  respiratory variability, suggesting right atrial pressure of 3 mmHg.     Encounter Diagnoses  Name Primary?   Hx of colonic polyps Yes   Brinegar term (current) use of antithrombotics/antiplatelets    Coronary artery disease involving native coronary artery of native heart without angina pectoris    Chronic systolic congestive heart failure (HCC)     Assessment & Plan  He is due for surveillance colonoscopy this year, needs evaluation by his cardiologist before doing that for reassessment of heart failure (anesthesia  staff will require that) as well as decision on if he can hold his Plavix  5 days before. If his EF remains in this range, his procedure would need to be done in the hospital endoscopy lab due to increased risks. We discussed all of this, he understood and was agreeable to seeing his cardiologist.  He was not given a follow-up appointment after the December visit, and I will route my note to that provider.  Davante also plans to call their office today.  After he is seen in the clinic and their note is routed to me, we can then plan appropriate timing and location of his surveillance colonoscopy.    Thank you for the courtesy of this consult.  Please call me with any questions or concerns.  Kerby Pearson III  CC: Referring provider noted above

## 2024-02-03 ENCOUNTER — Other Ambulatory Visit: Payer: Self-pay | Admitting: Family Medicine

## 2024-02-03 NOTE — Telephone Encounter (Signed)
 Last OV 09/03/22.   Pt has not been seen in over 1 year, needs OV.

## 2024-02-04 ENCOUNTER — Emergency Department (HOSPITAL_COMMUNITY)
Admission: EM | Admit: 2024-02-04 | Discharge: 2024-02-04 | Disposition: A | Discharge status: Home / Self Care | Attending: Emergency Medicine | Admitting: Emergency Medicine

## 2024-02-04 ENCOUNTER — Other Ambulatory Visit: Payer: Self-pay

## 2024-02-04 ENCOUNTER — Encounter (HOSPITAL_COMMUNITY): Payer: Self-pay

## 2024-02-04 DIAGNOSIS — Y92000 Kitchen of unspecified non-institutional (private) residence as  the place of occurrence of the external cause: Secondary | ICD-10-CM | POA: Insufficient documentation

## 2024-02-04 DIAGNOSIS — W260XXA Contact with knife, initial encounter: Secondary | ICD-10-CM | POA: Insufficient documentation

## 2024-02-04 DIAGNOSIS — S61213A Laceration without foreign body of left middle finger without damage to nail, initial encounter: Secondary | ICD-10-CM | POA: Diagnosis not present

## 2024-02-04 DIAGNOSIS — S61203A Unspecified open wound of left middle finger without damage to nail, initial encounter: Secondary | ICD-10-CM | POA: Diagnosis not present

## 2024-02-04 DIAGNOSIS — Z7982 Long term (current) use of aspirin: Secondary | ICD-10-CM | POA: Insufficient documentation

## 2024-02-04 DIAGNOSIS — Z7901 Long term (current) use of anticoagulants: Secondary | ICD-10-CM | POA: Insufficient documentation

## 2024-02-04 DIAGNOSIS — S61209A Unspecified open wound of unspecified finger without damage to nail, initial encounter: Secondary | ICD-10-CM

## 2024-02-04 MED ORDER — POVIDONE-IODINE 10 % EX SOLN
CUTANEOUS | Status: AC
  Administered 2024-02-04: 1
  Filled 2024-02-04: qty 14.8

## 2024-02-04 MED ORDER — LIDOCAINE HCL (PF) 1 % IJ SOLN
INTRAMUSCULAR | Status: AC
  Filled 2024-02-04: qty 5

## 2024-02-04 MED ORDER — ACETAMINOPHEN 325 MG PO TABS
650.0000 mg | ORAL_TABLET | Freq: Once | ORAL | Status: AC
  Administered 2024-02-04: 650 mg via ORAL
  Filled 2024-02-04: qty 2

## 2024-02-04 NOTE — Discharge Instructions (Signed)
 Elevate your hand is much as possible for the next couple of days.  Keep the bandage in place for 24 to 48 hours.  Clean the area very gently do not scrub or apply any topical medications.  Sutures will dissolve.  Return to the emergency department for any worsening symptoms or signs of infection.

## 2024-02-04 NOTE — ED Triage Notes (Signed)
 Pt arrived via POV after accidentally cutting the tip of left middle finger while chopping parsley at home.

## 2024-02-06 NOTE — ED Provider Notes (Signed)
 [place] EMERGENCY DEPARTMENT AT [facility] Provider Note   CSN: [id number] Arrival date & time: 02/04/24  1738     Patient presents with: Laceration   Todd Weeks is a 68 y.o. male.    Laceration Associated symptoms: no fever        Todd Weeks is a 68 y.o. male who presents to the Emergency Department complaining of laceration to the distal tip of his left middle finger.  He was using a kitchen knife to chop when the incident occurred.  Occurred several hours prior to arrival.  He takes ASA and plavix  daily.  He states that he has been unable to control the bleeding.  He denies pain, numbness or swelling of the finger.  Td is up to date  Prior to Admission medications   Medication Sig Start Date End Date Taking? Authorizing Provider  albuterol  (VENTOLIN  HFA) 108 (90 Base) MCG/ACT inhaler Inhale 2 puffs into the lungs every 6 (six) hours as needed for wheezing or shortness of breath. 08/05/23   Neda Jennet LABOR, MD  ALPRAZolam  (XANAX ) 0.25 MG tablet Take 2 tablets (0.5 mg total) by mouth 2 (two) times daily as needed for anxiety. 01/21/24   Neda Jennet LABOR, MD  aspirin  EC 81 MG tablet Take 1 tablet (81 mg total) by mouth daily. 12/16/20   Harvey Carlin BRAVO, MD  atorvastatin  (LIPITOR) 40 MG tablet Take 1 tablet (40 mg total) by mouth daily. 06/04/23   Kennyth Worth HERO, MD  clopidogrel  (PLAVIX ) 75 MG tablet Take 1 tablet (75 mg total) by mouth daily. 06/04/23   Kennyth Worth HERO, MD  diclofenac  Sodium (VOLTAREN ) 1 % GEL Apply 1 application topically 3 (three) times daily as needed (leg cramps/spasms).    [provider]  famotidine  (PEPCID ) 20 MG tablet Take 1 tablet (20 mg total) by mouth daily. 07/01/23   Kennyth Worth HERO, MD  FLUoxetine  (PROZAC ) 40 MG capsule TAKE (1) CAPSULE BY MOUTH DAILY Patient taking differently: Take 40 mg by mouth daily. 06/04/23   Kennyth Worth HERO, MD  furosemide  (LASIX ) 20 MG tablet Take 1 tablet (20 mg total) by mouth daily. 07/22/23    Verlin Lonni BIRCH, MD  gabapentin  (NEURONTIN ) 100 MG capsule Take 1-3 capsules (100-300 mg total) by mouth at bedtime as needed. 09/03/22   Joane Artist RAMAN, MD  irbesartan  (AVAPRO ) 300 MG tablet Take 1 tablet (300 mg total) by mouth daily. 07/01/23   Kennyth Worth HERO, MD  nitroGLYCERIN  (NITROSTAT ) 0.4 MG SL tablet Place 0.4 mg under the tongue every 5 (five) minutes as needed for chest pain. 04/23/21   [provider]  pantoprazole  (PROTONIX ) 40 MG tablet Take 1 tablet (40 mg total) by mouth daily. Take 30-60 min before first meal of the day 03/11/23   Darlean Ozell NOVAK, MD  tamsulosin  (FLOMAX ) 0.4 MG CAPS capsule Take 1 capsule (0.4 mg total) by mouth daily. 06/04/23   Kennyth Worth HERO, MD  Tiotropium Bromide Monohydrate  (SPIRIVA  RESPIMAT) 2.5 MCG/ACT AERS Inhale 2 puffs into the lungs daily. 08/05/23   Neda Jennet LABOR, MD    Allergies: Patient has no known allergies.    Review of Systems  Constitutional:  Negative for chills and fever.  Musculoskeletal:  Negative for arthralgias and myalgias.  Skin:  Positive for wound.  Neurological:  Negative for weakness and numbness.  Hematological:  Bruises/bleeds easily.    Updated Vital Signs BP 119/78 (BP Location: Right Arm)   Pulse 80   Temp 98.5  F (36.9 C) (Oral)   Resp 18   Ht 6' (1.829 m)   Wt 106.1 kg   SpO2 95%   BMI 31.74 kg/m   Physical Exam Vitals and nursing note reviewed.  Constitutional:      General: He is not in acute distress.    Appearance: Normal appearance. He is not ill-appearing.   Cardiovascular:     Rate and Rhythm: Normal rate and regular rhythm.     Pulses: Normal pulses.  Pulmonary:     Effort: Pulmonary effort is normal.   Musculoskeletal:        General: Signs of injury present. No swelling or deformity.     Comments: Avulsion of the skin to the distal tip of the left middle finger.  Slight involvement of the distal nail tip as well.  No FB's or bony injury.  Mild bleeding present    Skin:    General: Skin is warm.     Capillary Refill: Capillary refill takes less than 2 seconds.     Findings: No erythema or rash.   Neurological:     General: No focal deficit present.     Mental Status: He is alert.     Motor: No weakness.     (all labs ordered are listed, but only abnormal results are displayed) Labs Reviewed - No data to display  EKG: None  Radiology: No results found.   Procedures   LACERATION REPAIR Performed by: Quadre Bristol Authorized by: Allexis Bordenave Consent: Verbal consent obtained. Risks and benefits: risks, benefits and alternatives were discussed Consent given by: patient Patient identity confirmed: provided demographic data Prepped and Draped in normal sterile fashion Wound explored  Laceration Location: distal tip of right ring finger  Laceration Length: 1 cm  No Foreign Bodies seen or palpated  Anesthesia: local, digital block infiltration  Local anesthetic: lidocaine  1% w/o epinephrine   Anesthetic total: 3 ml  Irrigation method: syringe Amount of cleaning: standard  Skin closure: 4-0 vicryl used to tie off vein  Number of sutures: 2  Technique: simple interrupted  Patient tolerance: Patient tolerated the procedure well with no immediate complications.   Medications Ordered in the ED  povidone-iodine  (BETADINE ) 10 % external solution (1 Application  Given by Other 02/04/24 1811)  lidocaine  (PF) (XYLOCAINE ) 1 % injection (  Given by Other 02/04/24 1811)  acetaminophen  (TYLENOL ) tablet 650 mg (650 mg Oral Given 02/04/24 1913)                                    Medical Decision Making Pt here on plavix  with avulsion of skin to the distal tip of the finger.  Bleeding for several hours.  NV intact.  He has full ROM of the finger.  The tip of the skin is completely avulsed.  No fb's or bony involvement.  TD is up to date  Amount and/or Complexity of Data Reviewed Discussion of management or test interpretation  with external provider(s): Hemostasis obtained after using 2 sutures of vicryl to tie off bleeding vessel and Quick clot applied with finger dressing.  Pt observed in the dept w/o further bleeding.    Appears appropriate for d/c home,  given wound care instructions and return precautions.    Risk OTC drugs.        Final diagnoses:  Avulsion of skin of finger, initial encounter    ED Discharge Orders  None          Herlinda Milling, PA-C 02/06/24 1423    Melvenia Motto, MD 02/10/24 [phone]

## 2024-02-10 ENCOUNTER — Ambulatory Visit: Admitting: Primary Care

## 2024-02-10 ENCOUNTER — Ambulatory Visit: Admitting: Family Medicine

## 2024-02-12 NOTE — Progress Notes (Signed)
 Cardiology Office Note:  .   Date:  02/17/2024  ID:  Dorise LELON Law, DOB 1955/08/29, MRN [id number] PCP: Kennyth Worth HERO, MD  [facility] Providers Cardiologist:  Lonni Cash, MD    History of Present Illness: .   Todd Weeks is a 68 y.o. male with history of HTN, hyperlipidemia, LBBB, CAD, PAD, former tobacco abuse and depression.  Cardiac cath in May 2012 with mild disease in the RCA and no disease in the LAD or Circumflex. His PAD is followed in the [facility] office. He has had stenting of the right superficial femoral artery. Known occlusion of the left superficial femoral artery. He was admitted to [facility] 2019 with dyspnea and chest pain. Troponin negative. Echo 04/26/18 with normal LV systolic function, LVEF=55-60%. There were no wall motion abnormalities. No significant valve disease. Nuclear stress test 05/12/18 with no ischemia. He was seen in August 2022 with chest pain. Cardiac cath August 2022 with mild RCA disease (40% lesions in the proximal and mid RCA). Carotid artery dopplers January 2024 with no disease.   She saw Dr. Cash 07/2023 with recent pulmonary edema and echo showed new LVD EF 25-30% with global HK and grade 2 DD, mod MR.  Patient comes in for f/u. Denies dyspnea, edema controlled on low dose lasix . Has inspire for sleep apnea. Watches salt closely. 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 He gets chest pain if mowing lawn or weed eating when it's hot out. It feels like indigestion. Goes away with resting a few minutes. 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 He also needs clearance for routine colonoscopy.    ROS:    Studies Reviewed: SABRA    EKG Interpretation Date/Time:  Monday February 17 2024 08:44:05 EDT Ventricular Rate:  61 PR Interval:  210 QRS Duration:  158 QT Interval:  482 QTC Calculation: 485 R Axis:   -27  Text Interpretation: Sinus rhythm with 1st degree A-V block with occasional Premature ventricular  complexes Left bundle branch block When compared with ECG of 06/2023 Premature ventricular complexes are now Present Confirmed by Parthenia Klinefelter [phone]) on 02/17/2024 8:51:05 AM    Prior CV Studies:   Echo 07/2023 IMPRESSIONS     1. Left ventricular ejection fraction, by estimation, is 25 to 30%. The  left ventricle has severely decreased function. The left ventricle  demonstrates global hypokinesis. The left ventricular internal cavity size  was severely dilated. Left ventricular  diastolic parameters are consistent with Grade II diastolic dysfunction  (pseudonormalization).   2. Right ventricular systolic function is mildly reduced. The right  ventricular size is normal.   3. The mitral valve is normal in structure. Moderate mitral valve  regurgitation. No evidence of mitral stenosis.   4. The aortic valve is tricuspid. Aortic valve regurgitation is not  visualized. Aortic valve sclerosis is present, with no evidence of aortic  valve stenosis.   5. The inferior vena cava is normal in size with greater than 50%  respiratory variability, suggesting right atrial pressure of 3 mmHg.    Risk Assessment/Calculations:             Physical Exam:   VS:  BP 120/68   Pulse 61   Ht 6' (1.829 m)   Wt 231 lb (104.8 kg)   SpO2 96%   BMI 31.33 kg/m    Orhtostatics: No data found. Wt Readings from Last 3 Encounters:  02/17/24 231 lb (104.8 kg)  02/04/24 234 lb (106.1 kg)  01/27/24 234 lb 9.6 oz (106.4 kg)    GEN: overweight, in no acute distress NECK: No JVD; No carotid bruits CARDIAC:  RRR, no murmurs, rubs, gallops RESPIRATORY:  Clear to auscultation without rales, wheezing or rhonchi  ABDOMEN: Soft, non-tender, non-distended EXTREMITIES:  No edema; No deformity   ASSESSMENT AND PLAN: .    CAD with angina: Mild CAD by cath in August 2022.chest burning with mowing lawn/weed eating. With low EF will order PET CT. Continue ASA, Plavix  and statin. Give NTG prn.    Chronic systolic  and diastolic CHF:  Echo  08/12/23  LVEF 25-30% grade2DD, patient well compensated today on low dose lasix  and irbesartan . With LBBB/first degree AV block will hold off on coreg.  Preop clearance for colonoscopy by Dr. Starlet don't see preop notes-will notify team. Hold off on clearance with chest pain and      Hyperlipidemia: check labs today Continue statin.     HTN: BP is controlled. No changes today    PAD: Followed in [facility]. No carotid disease noted on dopplers January 2024. He is on Plavix  for his PAD.    LBBB: Chronic   Former Tobacco abuse: He stopped smoking in 2012.      Informed Consent   Shared Decision Making/Informed Consent The risks [chest pain, shortness of breath, cardiac arrhythmias, dizziness, blood pressure fluctuations, myocardial infarction, stroke/transient ischemic attack, nausea, vomiting, allergic reaction, radiation exposure, metallic taste sensation and life-threatening complications (estimated to be 1 in 10,000)], benefits (risk stratification, diagnosing coronary artery disease, treatment guidance) and alternatives of a cardiac PET stress test were discussed in detail with Mr. Bouse and he agrees to proceed.     Dispo: f/u with me after PET  Signed, Olivia Pavy, PA-C

## 2024-02-17 ENCOUNTER — Ambulatory Visit: Attending: Physician Assistant | Admitting: Physician Assistant

## 2024-02-17 ENCOUNTER — Telehealth: Payer: Self-pay | Admitting: Gastroenterology

## 2024-02-17 ENCOUNTER — Encounter: Payer: Self-pay | Admitting: Physician Assistant

## 2024-02-17 VITALS — BP 120/68 | HR 61 | Ht 72.0 in | Wt 231.0 lb

## 2024-02-17 DIAGNOSIS — I10 Essential (primary) hypertension: Secondary | ICD-10-CM

## 2024-02-17 DIAGNOSIS — I447 Left bundle-branch block, unspecified: Secondary | ICD-10-CM | POA: Diagnosis not present

## 2024-02-17 DIAGNOSIS — I739 Peripheral vascular disease, unspecified: Secondary | ICD-10-CM | POA: Diagnosis not present

## 2024-02-17 DIAGNOSIS — I2510 Atherosclerotic heart disease of native coronary artery without angina pectoris: Secondary | ICD-10-CM

## 2024-02-17 DIAGNOSIS — E782 Mixed hyperlipidemia: Secondary | ICD-10-CM | POA: Diagnosis not present

## 2024-02-17 DIAGNOSIS — Z01818 Encounter for other preprocedural examination: Secondary | ICD-10-CM | POA: Diagnosis not present

## 2024-02-17 DIAGNOSIS — I259 Chronic ischemic heart disease, unspecified: Secondary | ICD-10-CM

## 2024-02-17 LAB — LIPID PANEL

## 2024-02-17 NOTE — Telephone Encounter (Signed)
 Natasha with [facility] is calling to get the Pre Op information for the patient's cardiac clearance. She needs the date of the colonoscopy

## 2024-02-17 NOTE — Telephone Encounter (Signed)
 Spoke with [facility], they sent a msg to the nurse and let them know there has been no colonoscopy scheduled as of yet. Dr Legrand wanted pt to be seen by cardiology before scheduling procedure.

## 2024-02-17 NOTE — Telephone Encounter (Signed)
 Office called back to say they is no date as of right now, they wanted the patient to be seen first. Please advise

## 2024-02-17 NOTE — Patient Instructions (Signed)
 Medication Instructions:  Your physician recommends that you continue on your current medications as directed. Please refer to the Current Medication list given to you today.  *If you need a refill on your cardiac medications before your next appointment, please call your pharmacy*  Lab Work: TODAY: LIPIDS, CBC, CMET, TSH  If you have labs (blood work) drawn today and your tests are completely normal, you will receive your results only by: MyChart Message (if you have MyChart) OR A paper copy in the mail If you have any lab test that is abnormal or we need to change your treatment, we will call you to review the results.  Testing/Procedures:    Please report to Radiology at the [facility] Main Entrance 30 minutes early for your test.  [address] [place], [place] [address]  How to Prepare for Your Cardiac PET/CT Stress Test:  Nothing to eat or drink, except water , 3 hours prior to arrival time.  NO caffeine/decaffeinated products, or chocolate 12 hours prior to arrival. (Please note decaffeinated beverages (teas/coffees) still contain caffeine).  If you have caffeine within 12 hours prior, the test will need to be rescheduled.  Medication instructions: Do not take erectile dysfunction medications for 72 hours prior to test (sildenafil, tadalafil) Do not take nitrates (isosorbide mononitrate, Ranexa) the day before or day of test Do not take tamsulosin  the day before or morning of test Hold theophylline containing medications for 12 hours. Hold Dipyridamole 48 hours prior to the test.  Diabetic Preparation: If able to eat breakfast prior to 3 hour fasting, you may take all medications, including your insulin. Do not worry if you miss your breakfast dose of insulin - start at your next meal. If you do not eat prior to 3 hour fast-Hold all diabetes (oral and insulin) medications. Patients who wear a continuous glucose monitor MUST remove the device prior to  scanning.  You may take your remaining medications with water .  NO perfume, cologne or lotion on chest or abdomen area.  Total time is 1 to 2 hours; you may want to bring reading material for the waiting time.  IF YOU THINK YOU MAY BE PREGNANT, OR ARE NURSING PLEASE INFORM THE TECHNOLOGIST.  In preparation for your appointment, medication and supplies will be purchased.  Appointment availability is limited, so if you need to cancel or reschedule, please call the Radiology Department Scheduler at [phone] 24 hours in advance to avoid a cancellation fee of $100.00  What to Expect When you Arrive:  Once you arrive and check in for your appointment, you will be taken to a preparation room within the Radiology Department.  A technologist or Nurse will obtain your medical history, verify that you are correctly prepped for the exam, and explain the procedure.  Afterwards, an IV will be started in your arm and electrodes will be placed on your skin for EKG monitoring during the stress portion of the exam. Then you will be escorted to the PET/CT scanner.  There, staff will get you positioned on the scanner and obtain a blood pressure and EKG.  During the exam, you will continue to be connected to the EKG and blood pressure machines.  A small, safe amount of a radioactive tracer will be injected in your IV to obtain a series of pictures of your heart along with an injection of a stress agent.    After your Exam:  It is recommended that you eat a meal and drink a caffeinated beverage  to counter act any effects of the stress agent.  Drink plenty of fluids for the remainder of the day and urinate frequently for the first couple of hours after the exam.  Your doctor will inform you of your test results within 7-10 business days.  For more information and frequently asked questions, please visit our website: [email or web address]  For questions about your test or how to prepare for your  test, please call: Cardiac Imaging Nurse Navigators Office: [phone]   Follow-Up: At [facility], you and your health needs are our priority.  As part of our continuing mission to provide you with exceptional heart care, our providers are all part of one team.  This team includes your primary Cardiologist (physician) and Advanced Practice Providers or APPs (Physician Assistants and Nurse Practitioners) who all work together to provide you with the care you need, when you need it.  Your next appointment:   AFTER TESTING IS COMPLETE   Provider:   Olivia Pavy, PA-C     We recommend signing up for the patient portal called MyChart.  Sign up information is provided on this After Visit Summary.  MyChart is used to connect with patients for Virtual Visits (Telemedicine).  Patients are able to view lab/test results, encounter notes, upcoming appointments, etc.  Non-urgent messages can be sent to your provider as well.   To learn more about what you can do with MyChart, go to [email or web address].

## 2024-02-18 ENCOUNTER — Ambulatory Visit: Payer: Self-pay | Admitting: Physician Assistant

## 2024-02-18 LAB — COMPREHENSIVE METABOLIC PANEL WITH GFR
ALT: 20 IU/L (ref 0–44)
AST: 22 IU/L (ref 0–40)
Albumin: 4.3 g/dL (ref 3.9–4.9)
Alkaline Phosphatase: 83 IU/L (ref 44–121)
BUN/Creatinine Ratio: 25 — AB (ref 10–24)
BUN: 18 mg/dL (ref 8–27)
Bilirubin Total: 0.8 mg/dL (ref 0.0–1.2)
CO2: 22 mmol/L (ref 20–29)
Calcium: 8.8 mg/dL (ref 8.6–10.2)
Chloride: 104 mmol/L (ref 96–106)
Creatinine, Ser: 0.72 mg/dL — AB (ref 0.76–1.27)
Globulin, Total: 2.2 g/dL (ref 1.5–4.5)
Glucose: 99 mg/dL (ref 70–99)
Potassium: 4.5 mmol/L (ref 3.5–5.2)
Sodium: 138 mmol/L (ref 134–144)
Total Protein: 6.5 g/dL (ref 6.0–8.5)
eGFR: 99 mL/min/1.73 (ref >59)

## 2024-02-18 LAB — CBC
Hematocrit: 38.4 % (ref 37.5–51.0)
Hemoglobin: 12.8 g/dL — ABNORMAL LOW (ref 13.0–17.7)
MCH: 31.4 pg (ref 26.6–33.0)
MCHC: 33.3 g/dL (ref 31.5–35.7)
MCV: 94 fL (ref 79–97)
Platelets: 223 x10E3/uL (ref 150–450)
RBC: 4.07 x10E6/uL — ABNORMAL LOW (ref 4.14–5.80)
RDW: 12.1 % (ref 11.6–15.4)
WBC: 6 x10E3/uL (ref 3.4–10.8)

## 2024-02-18 LAB — LIPID PANEL
Cholesterol, Total: 165 mg/dL (ref 100–199)
HDL: 60 mg/dL (ref >39)
LDL CALC COMMENT:: 2.8 ratio (ref 0.0–5.0)
LDL Chol Calc (NIH): 86 mg/dL (ref 0–99)
Triglycerides: 105 mg/dL (ref 0–149)
VLDL Cholesterol Cal: 19 mg/dL (ref 5–40)

## 2024-02-18 LAB — TSH: TSH: 2.25 u[IU]/mL (ref 0.450–4.500)

## 2024-02-18 NOTE — Telephone Encounter (Signed)
   Pre-operative Risk Assessment    Patient Name: Todd Weeks  DOB: Apr 06, 1956 MRN: [id number]   Date of last office visit: 02/17/24 OLIVIA PAVY, PAC Date of next office visit: NONE   Request for Surgical Clearance    Procedure:  COLONOSCOPY  Date of Surgery:  Clearance TBD                                Surgeon:  DR. DANIS Surgeon's Group or Practice Name:  [facility] Phone number:  [phone] Fax number:  [phone] ATTN: COREAN RAMAN. CMA   Type of Clearance Requested:   - Medical  - Pharmacy:  Hold Clopidogrel  (Plavix ) x 5 DAYS PRIOR   Type of Anesthesia:  PROPOFOL    Additional requests/questions:    [name]   02/18/2024, 11:04 AM

## 2024-02-24 NOTE — Telephone Encounter (Signed)
   Patient Name: Todd Weeks  DOB: 1955-09-22 MRN: [id number]  Primary Cardiologist: Lonni Cash, MD  Chart reviewed as part of pre-operative protocol coverage. Patient was already seen 02/17/24 by Olivia Pavy PA-C for angina and pre-op evaluation at which time further testing is planned - cardiac PET scheduled 04/22/24.  Per office protocol, the provider who saw the patient should forward their finalized clearance decision to requesting party below upon completion of testing. I will route this message to Olivia so they are aware of where to fax final recommendation to.  Will route this message to surgeon as FYI. Will remove this message from the pre-op box as separate preop APP input is not needed.  Sota Hetz N Gabriell Casimir, PA-C 02/24/2024, 9:52 AM

## 2024-03-23 ENCOUNTER — Encounter (HOSPITAL_BASED_OUTPATIENT_CLINIC_OR_DEPARTMENT_OTHER): Payer: Self-pay

## 2024-03-23 ENCOUNTER — Encounter (HOSPITAL_BASED_OUTPATIENT_CLINIC_OR_DEPARTMENT_OTHER): Admitting: Pulmonary Disease

## 2024-04-20 ENCOUNTER — Encounter (HOSPITAL_COMMUNITY): Payer: Self-pay

## 2024-04-21 ENCOUNTER — Telehealth (HOSPITAL_COMMUNITY): Payer: Self-pay | Admitting: *Deleted

## 2024-04-21 NOTE — Telephone Encounter (Signed)
 Reaching out to patient to offer assistance regarding upcoming cardiac imaging study; pt verbalizes understanding of appt date/time, parking situation and where to check in, pre-test NPO status name and call back number provided for further questions should they arise  Larey Brick RN Navigator Cardiac Imaging [facility] [phone] office [phone] cell  Patient aware to avoid caffeine 12 hours prior to his cardiac PET scan.

## 2024-04-22 ENCOUNTER — Ambulatory Visit (HOSPITAL_COMMUNITY)
Admission: RE | Admit: 2024-04-22 | Discharge: 2024-04-22 | Disposition: A | Discharge status: Home / Self Care | Source: Ambulatory Visit | Attending: Physician Assistant | Admitting: Physician Assistant

## 2024-04-22 DIAGNOSIS — I517 Cardiomegaly: Secondary | ICD-10-CM | POA: Diagnosis not present

## 2024-04-22 DIAGNOSIS — I2510 Atherosclerotic heart disease of native coronary artery without angina pectoris: Secondary | ICD-10-CM | POA: Diagnosis not present

## 2024-04-22 DIAGNOSIS — I259 Chronic ischemic heart disease, unspecified: Secondary | ICD-10-CM | POA: Diagnosis not present

## 2024-04-22 DIAGNOSIS — I700 Atherosclerosis of aorta: Secondary | ICD-10-CM | POA: Diagnosis not present

## 2024-04-22 LAB — NM PET CT CARDIAC PERFUSION MULTI W/ABSOLUTE BLOODFLOW
LV dias vol: 273 mL (ref 62–150)
LV sys vol: 208 mL (ref 4.2–5.8)
MBFR: 2.08
Nuc Rest EF: 24 %
Nuc Stress EF: 24 %
Rest MBF: 0.77 ml/g/min
Rest Nuclear Isotope Dose: 26.1 mCi
ST Depression (mm): 0 mm
Stress MBF: 1.6 ml/g/min
Stress Nuclear Isotope Dose: 26.1 mCi

## 2024-04-22 MED ORDER — REGADENOSON 0.4 MG/5ML IV SOLN
0.4000 mg | Freq: Once | INTRAVENOUS | Status: AC
  Administered 2024-04-22: 0.4 mg via INTRAVENOUS

## 2024-04-22 MED ORDER — RUBIDIUM RB82 GENERATOR (RUBYFILL)
26.1300 | PACK | Freq: Once | INTRAVENOUS | Status: AC
Start: 2024-04-22 — End: 2024-04-22
  Administered 2024-04-22: 26.13 via INTRAVENOUS

## 2024-04-22 MED ORDER — RUBIDIUM RB82 GENERATOR (RUBYFILL)
26.0900 | PACK | Freq: Once | INTRAVENOUS | Status: AC
  Administered 2024-04-22: 26.09 via INTRAVENOUS

## 2024-04-22 MED ORDER — REGADENOSON 0.4 MG/5ML IV SOLN
INTRAVENOUS | Status: AC
  Filled 2024-04-22: qty 5

## 2024-04-22 NOTE — Progress Notes (Signed)
 Covering Todd Weeks while she is out. Please let pt know stress test showed no evidence of decreased blood flow/heart attack but his pumping function remains severely reduced - was decreased in 07/2023 which was new from prior years. Discussed with Dr. Verlin who agrees with need to please schedule patient for in office visit to discuss continued decline of LV function as we feel he likely needs updated cardiac cath to formally evaluate this, as well as potential medication adjustment. Would try to expedite OV. If Todd doesn't have any availability soon, can schedule with Dr. Verlin or another APP. Thanks!

## 2024-04-23 NOTE — Progress Notes (Unsigned)
 No chief complaint on file.  History of Present Illness: 68 yo male with history of HTN, hyperlipidemia, LBBB, CAD, PAD, tobacco abuse and depression here today for cardiac follow up. I saw him last in 2019. I had followed him for PAD in the past. Cardiac cath in May 2012 with mild disease in the RCA and no disease in the LAD or Circumflex. His PAD is followed in the [facility] office. He has had stenting of the right superficial femoral artery. Known occlusion of the left superficial femoral artery. He was admitted to [facility] September 2019 with dyspnea and chest pain. Troponin negative. Echo 04/26/18 with normal LV systolic function, LVEF=55-60%. There were no wall motion abnormalities. No significant valve disease. Nuclear stress test 05/12/18 with no ischemia. He was seen in August 2022 with chest pain. Cardiac cath August 2022 with mild RCA disease (40% lesions in the proximal and mid RCA). Carotid artery dopplers January 2024 with no disease. He was seen in December 2024 with dyspnea. BNP was mildly elevated. Echo December 2024 with LVEF=25-30%, moderate MR. This echo was ordered in primary care and the results were not seen in our office. PET CT 04/22/24 with no ischemia.   He is here today for follow up. The patient denies any chest pain, dyspnea, palpitations, lower extremity edema, orthopnea, PND, dizziness, near syncope or syncope.   Primary Care Physician: Kennyth Worth HERO, MD  Past Medical History:  Diagnosis Date   Bell's palsy    Cataract    Mixed form OU   Closed nondisplaced fracture of neck of third metacarpal bone of right hand 02/04/2017   Clotting disorder (HCC)    COPD (chronic obstructive pulmonary disease) (HCC)    Depression    Diverticulitis    GERD (gastroesophageal reflux disease)    Headache(784.0)    HTN (hypertension)    Hyperlipidemia    Hypertensive retinopathy    OU   Mild CAD 2012   PVD (peripheral vascular disease) (HCC)    Retinal detachment    Sexual dysfunction     Sleep apnea     Past Surgical History:  Procedure Laterality Date   ABDOMINAL AORTOGRAM W/LOWER EXTREMITY Bilateral 02/05/2020   Procedure: ABDOMINAL AORTOGRAM W/LOWER EXTREMITY;  Surgeon: Harvey Carlin BRAVO, MD;  Location: [facility];  Service: Cardiovascular;  Laterality: Bilateral;   ABDOMINAL AORTOGRAM W/LOWER EXTREMITY N/A 12/16/2020   Procedure: ABDOMINAL AORTOGRAM W/LOWER EXTREMITY;  Surgeon: Harvey Carlin BRAVO, MD;  Location: [facility];  Service: Cardiovascular;  Laterality: N/A;   CATARACT EXTRACTION     DRUG INDUCED ENDOSCOPY Right 01/15/2023   Procedure: DRUG INDUCED SLEEP  ENDOSCOPY;  Surgeon: Carlie Clark, MD;  Location: [facility];  Service: ENT;  Laterality: Right;   EYE SURGERY Right 05/06/2020   Pneumatic retinopexy for rheg. RD repair - Dr. Redell Hans   EYE SURGERY Right 05/26/2020   PPV/MP - Dr. Redell Hans   GAS INSERTION Right 05/26/2020   Procedure: INSERTION OF GAS;  Surgeon: Hans Redell, MD;  Location: [facility];  Service: Ophthalmology;  Laterality: Right;   IMPLANTATION OF HYPOGLOSSAL NERVE STIMULATOR Right 05/13/2023   Procedure: IMPLANTATION OF HYPOGLOSSAL NERVE STIMULATOR;  Surgeon: Carlie Clark, MD;  Location: [facility];  Service: ENT;  Laterality: Right;   INGUINAL HERNIA REPAIR Right 05/29/2021   Procedure: RIGHT INGUINAL HERNIA REPAIR WITH MESH;  Surgeon: Curvin Deward MOULD, MD;  Location: [facility];  Service: General;  Laterality: Right;  TAP BLOCK  LEFT HEART CATH AND CORONARY ANGIOGRAPHY N/A 04/05/2021   Procedure: LEFT HEART CATH AND CORONARY ANGIOGRAPHY;  Surgeon: Verlin Lonni BIRCH, MD;  Location: [facility];  Service: Cardiovascular;  Laterality: N/A;   MEMBRANE PEEL Right 05/26/2020   Procedure: MEMBRANE PEEL;  Surgeon: Valdemar Rogue, MD;  Location[facility] OR;  Service: Ophthalmology;  Laterality: Right;   PARS PLANA VITRECTOMY Right 05/26/2020   Procedure: PARS PLANA VITRECTOMY  WITH 25 GAUGE;  Surgeon: Valdemar Rogue, MD;  Location: [facility] OR;  Service: Ophthalmology;  Laterality: Right;   PERIPHERAL VASCULAR INTERVENTION Right 12/16/2020   Procedure: PERIPHERAL VASCULAR INTERVENTION;  Surgeon: Harvey Carlin BRAVO, MD;  Location: [facility] INVASIVE CV LAB;  Service: Cardiovascular;  Laterality: Right;  SFA (distal)   PHOTOCOAGULATION Right 05/26/2020   Procedure: PHOTOCOAGULATION;  Surgeon: Valdemar Rogue, MD;  Location: [facility] OR;  Service: Ophthalmology;  Laterality: Right;   PROSTATE BIOPSY  2015   RETINAL DETACHMENT SURGERY Right 05/06/2020   Pneumatic retinopexy for rheg RD repair - Dr. Rogue Valdemar   RETINAL DETACHMENT SURGERY Right 05/26/2020   PPV/MP - Dr. Rogue Valdemar    Current Outpatient Medications  Medication Sig Dispense Refill   albuterol  (VENTOLIN  HFA) 108 (90 Base) MCG/ACT inhaler Inhale 2 puffs into the lungs every 6 (six) hours as needed for wheezing or shortness of breath. 8 g 6   ALPRAZolam  (XANAX ) 0.25 MG tablet Take 2 tablets (0.5 mg total) by mouth 2 (two) times daily as needed for anxiety. 90 tablet 1   aspirin  EC 81 MG tablet Take 1 tablet (81 mg total) by mouth daily. 150 tablet 2   atorvastatin  (LIPITOR) 40 MG tablet Take 1 tablet (40 mg total) by mouth daily. 90 tablet 3   clopidogrel  (PLAVIX ) 75 MG tablet Take 1 tablet (75 mg total) by mouth daily. 90 tablet 3   diclofenac  Sodium (VOLTAREN ) 1 % GEL Apply 1 application topically 3 (three) times daily as needed (leg cramps/spasms).     famotidine  (PEPCID ) 20 MG tablet Take 1 tablet (20 mg total) by mouth daily. 90 tablet 3   FLUoxetine  (PROZAC ) 40 MG capsule TAKE (1) CAPSULE BY MOUTH DAILY (Patient taking differently: Take 40 mg by mouth daily.) 90 capsule 3   furosemide  (LASIX ) 20 MG tablet Take 1 tablet (20 mg total) by mouth daily. 90 tablet 3   gabapentin  (NEURONTIN ) 100 MG capsule Take 1-3 capsules (100-300 mg total) by mouth at bedtime as needed. 90 capsule 1   irbesartan  (AVAPRO ) 300 MG tablet Take 1  tablet (300 mg total) by mouth daily. 90 tablet 3   nitroGLYCERIN  (NITROSTAT ) 0.4 MG SL tablet Place 0.4 mg under the tongue every 5 (five) minutes as needed for chest pain.     pantoprazole  (PROTONIX ) 40 MG tablet Take 1 tablet (40 mg total) by mouth daily. Take 30-60 min before first meal of the day 30 tablet 2   tamsulosin  (FLOMAX ) 0.4 MG CAPS capsule Take 1 capsule (0.4 mg total) by mouth daily. 90 capsule 3   Tiotropium Bromide Monohydrate  (SPIRIVA  RESPIMAT) 2.5 MCG/ACT AERS Inhale 2 puffs into the lungs daily. 1 g 5   No current facility-administered medications for this visit.    No Known Allergies  Social History   Socioeconomic History   Marital status: Single    Spouse name: Not on file   Number of children: 2   Years of education: 12   Highest education level: 11th grade  Occupational History   Occupation: Systems analyst   Occupation:  Unemployed    Comment: disability  Tobacco Use   Smoking status: Former    Current packs/day: 0.00    Average packs/day: 1.5 packs/day for 46.0 years (69.0 ttl pk-yrs)    Types: Cigarettes    Start date: 12/11/1964    Quit date: 12/12/2010    Years since quitting: 13.3   Smokeless tobacco: Never  Vaping Use   Vaping status: Never Used  Substance and Sexual Activity   Alcohol use: Yes    Alcohol/week: 4.0 standard drinks of alcohol    Types: 4 Cans of beer per week    Comment: a couple of beer on a weekend   Drug use: Not Currently    Types: Cocaine, Marijuana, Crack cocaine    Comment: over 25 years ago   Sexual activity: Not on file  Other Topics Concern   Not on file  Social History Narrative   Raised in [place], [place]. Does not have any religious beliefs that would effect healthcare. Lives in house with sister. Likes to ride motorcycle for fun.    Moving out on his own; in the next couple of weeks will be moving back home that he had rented      Motorcycles; with a helmet   Social Drivers of Health   Financial Resource  Strain: Medium Risk (10/30/2023)   Overall Financial Resource Strain (CARDIA)    Difficulty of Paying Living Expenses: Somewhat hard  Food Insecurity: No Food Insecurity (10/30/2023)   Hunger Vital Sign    Worried About Running Out of Food in the Last Year: Never true    Ran Out of Food in the Last Year: Never true  Transportation Needs: No Transportation Needs (10/30/2023)   PRAPARE - Administrator, Civil Service (Medical): No    Lack of Transportation (Non-Medical): No  Physical Activity: Insufficiently Active (10/31/2023)   Exercise Vital Sign    Days of Exercise per Week: 3 days    Minutes of Exercise per Session: 30 min  Stress: Stress Concern Present (11/11/2023)   [employer] of Occupational Health - Occupational Stress Questionnaire    Feeling of Stress : Very much  Social Connections: Socially Isolated (10/30/2023)   Social Connection and Isolation Panel    Frequency of Communication with Friends and Family: Never    Frequency of Social Gatherings with Friends and Family: Never    Attends Religious Services: Never    Database administrator or Organizations: No    Attends Engineer, structural: Not on file    Marital Status: Never married  Intimate Partner Violence: Not At Risk (10/31/2023)   Humiliation, Afraid, Rape, and Kick questionnaire    Fear of Current or Ex-Partner: No    Emotionally Abused: No    Physically Abused: No    Sexually Abused: No    Family History  Problem Relation Age of Onset   Hypertension Mother    Deep vein thrombosis Father    Diabetes Brother    Tuberculosis Maternal Grandfather    Emphysema Maternal Grandfather    Lung disease Brother    Asthma Son    Rectal cancer Neg Hx    Colon cancer Neg Hx     Review of Systems:  As stated in the HPI and otherwise negative.   There were no vitals taken for this visit.  Physical Examination: General: Well developed, well nourished, NAD  HEENT: OP clear, mucus membranes  moist  SKIN: warm, dry. No rashes. Neuro: No focal deficits  Musculoskeletal: Muscle strength 5/5 all ext  Psychiatric: Mood and affect normal  Neck: No JVD, no carotid bruits, no thyromegaly, no lymphadenopathy.  Lungs:Clear bilaterally, no wheezes, rhonci, crackles Cardiovascular: Regular rate and rhythm. No murmurs, gallops or rubs. Abdomen:Soft. Bowel sounds present. Non-tender.  Extremities: No lower extremity edema. Pulses are 2 + in the bilateral DP/PT.  EKG:  EKG is *** ordered today. The ekg ordered today demonstrates   Recent Labs: 06/27/2023: Pro B Natriuretic peptide (BNP) 291.0 02/17/2024: ALT 20; BUN 18; Creatinine, Ser 0.72; Hemoglobin 12.8; Platelets 223; Potassium 4.5; Sodium 138; TSH 2.250   Lipid Panel    Component Value Date/Time   CHOL 165 02/17/2024 1002   CHOL 137 06/11/2014 0000   TRIG 105 02/17/2024 1002   TRIG 173 06/11/2014 0000   HDL 60 02/17/2024 1002   CHOLHDL 2.8 02/17/2024 1002   CHOLHDL 2 04/23/2022 0830   VLDL 11.8 04/23/2022 0830   VLDL 35 06/11/2014 0000   LDLCALC 86 02/17/2024 1002     Wt Readings from Last 3 Encounters:  02/17/24 231 lb (104.8 kg)  02/04/24 234 lb (106.1 kg)  01/27/24 234 lb 9.6 oz (106.4 kg)    Assessment and Plan:   1. CAD with angina: Mild CAD by cath in August 2022. PET CT 04/22/24 does not show ischemia. Given fall in LVEF noted by echo and also demonstrated on stress test and with exertional chest pain, will need cardiac cath.  Continue ASA, Plavix  and statin.   2. Chronic diastolic CHF: ***   3. Former Tobacco abuse: He stopped smoking in 2012.   4. Hyperlipidemia: Lipids followed in primary care. LDL ***. Continue statin  5. HTN: BP is well controlled. Continue current therapy  6. PAD: Followed in [facility]. No carotid disease noted on dopplers January 2024. He is on Plavix  for his PAD.   7. LBBB: Chronic  8. Cardiomyopathy: As above, will arrange cardiac cath to exclude progression of CAD. Continue  Irbesartan . *** ? Beta blocker/aldactone  Labs/ tests ordered today include:   No orders of the defined types were placed in this encounter.  Disposition:   F/U with me  in 12 months  Signed, Lonni Cash, MD 04/23/2024 10:28 PM    [facility] [address] [place], [place], [place]  [address] Phone: [phone]; Fax: [phone]

## 2024-04-23 NOTE — H&P (View-Only) (Signed)
 No chief complaint on file.  History of Present Illness: 68 yo male with history of HTN, hyperlipidemia, LBBB, CAD, PAD, tobacco abuse and depression here today for cardiac follow up. I saw him last in 2019. I had followed him for PAD in the past. Cardiac cath in May 2012 with mild disease in the RCA and no disease in the LAD or Circumflex. His PAD is followed in the [facility] office. He has had stenting of the right superficial femoral artery. Known occlusion of the left superficial femoral artery. He was admitted to [facility] September 2019 with dyspnea and chest pain. Troponin negative. Echo 04/26/18 with normal LV systolic function, LVEF=55-60%. There were no wall motion abnormalities. No significant valve disease. Nuclear stress test 05/12/18 with no ischemia. He was seen in August 2022 with chest pain. Cardiac cath August 2022 with mild RCA disease (40% lesions in the proximal and mid RCA). Carotid artery dopplers January 2024 with no disease. He was seen in December 2024 with dyspnea. BNP was mildly elevated. Echo December 2024 with LVEF=25-30%, moderate MR. This echo was ordered in primary care and the results were not seen in our office. PET CT 04/22/24 with no ischemia.   He is here today for follow up. The patient denies any chest pain, dyspnea, palpitations, lower extremity edema, orthopnea, PND, dizziness, near syncope or syncope.   Primary Care Physician: Kennyth Worth HERO, MD  Past Medical History:  Diagnosis Date   Bell's palsy    Cataract    Mixed form OU   Closed nondisplaced fracture of neck of third metacarpal bone of right hand 02/04/2017   Clotting disorder (HCC)    COPD (chronic obstructive pulmonary disease) (HCC)    Depression    Diverticulitis    GERD (gastroesophageal reflux disease)    Headache(784.0)    HTN (hypertension)    Hyperlipidemia    Hypertensive retinopathy    OU   Mild CAD 2012   PVD (peripheral vascular disease) (HCC)    Retinal detachment    Sexual dysfunction     Sleep apnea     Past Surgical History:  Procedure Laterality Date   ABDOMINAL AORTOGRAM W/LOWER EXTREMITY Bilateral 02/05/2020   Procedure: ABDOMINAL AORTOGRAM W/LOWER EXTREMITY;  Surgeon: Harvey Carlin BRAVO, MD;  [facility];  Service: Cardiovascular;  Laterality: Bilateral;   ABDOMINAL AORTOGRAM W/LOWER EXTREMITY N/A 12/16/2020   Procedure: ABDOMINAL AORTOGRAM W/LOWER EXTREMITY;  Surgeon: Harvey Carlin BRAVO, MD;  Location: [facility];  Service: Cardiovascular;  Laterality: N/A;   CATARACT EXTRACTION     DRUG INDUCED ENDOSCOPY Right 01/15/2023   Procedure: DRUG INDUCED SLEEP  ENDOSCOPY;  Surgeon: Carlie Clark, MD;  Location: [facility];  Service: ENT;  Laterality: Right;   EYE SURGERY Right 05/06/2020   Pneumatic retinopexy for rheg. RD repair - Dr. Redell Hans   EYE SURGERY Right 05/26/2020   PPV/MP - Dr. Redell Hans   GAS INSERTION Right 05/26/2020   Procedure: INSERTION OF GAS;  Surgeon: Hans Redell, MD;  Location: [facility];  Service: Ophthalmology;  Laterality: Right;   IMPLANTATION OF HYPOGLOSSAL NERVE STIMULATOR Right 05/13/2023   Procedure: IMPLANTATION OF HYPOGLOSSAL NERVE STIMULATOR;  Surgeon: Carlie Clark, MD;  Location: [facility];  Service: ENT;  Laterality: Right;   INGUINAL HERNIA REPAIR Right 05/29/2021   Procedure: RIGHT INGUINAL HERNIA REPAIR WITH MESH;  Surgeon: Curvin Deward MOULD, MD;  Location: [facility];  Service: General;  Laterality: Right;  TAP BLOCK  LEFT HEART CATH AND CORONARY ANGIOGRAPHY N/A 04/05/2021   Procedure: LEFT HEART CATH AND CORONARY ANGIOGRAPHY;  Surgeon: Verlin Lonni BIRCH, MD;  Location: [facility];  Service: Cardiovascular;  Laterality: N/A;   MEMBRANE PEEL Right 05/26/2020   Procedure: MEMBRANE PEEL;  Surgeon: Valdemar Rogue, MD;  Location[facility] OR;  Service: Ophthalmology;  Laterality: Right;   PARS PLANA VITRECTOMY Right 05/26/2020   Procedure: PARS PLANA VITRECTOMY  WITH 25 GAUGE;  Surgeon: Valdemar Rogue, MD;  Location: [facility] OR;  Service: Ophthalmology;  Laterality: Right;   PERIPHERAL VASCULAR INTERVENTION Right 12/16/2020   Procedure: PERIPHERAL VASCULAR INTERVENTION;  Surgeon: Harvey Carlin BRAVO, MD;  Location: [facility] INVAS[facility] CV LAB;  Service: Cardiovascular;  Laterality: Right;  SFA (distal)   PHOTOCOAGULATION Right 05/26/2020   Procedure: PHOTOCOAGULATION;  Surgeon: Valdemar Rogue, MD;  Location: [facility] OR;  Service: Ophthalmology;  Laterality: Right;   PROSTATE BIOPSY  2015   RETINAL DETACHMENT SURGERY Right 05/06/2020   Pneumatic retinopexy for rheg RD repair - Dr. Rogue Valdemar   RETINAL DETACHMENT SURGERY Right 05/26/2020   PPV/MP - Dr. Rogue Valdemar    Current Outpatient Medications  Medication Sig Dispense Refill   albuterol  (VENTOLIN  HFA) 108 (90 Base) MCG/ACT inhaler Inhale 2 puffs into the lungs every 6 (six) hours as needed for wheezing or shortness of breath. 8 g 6   ALPRAZolam  (XANAX ) 0.25 MG tablet Take 2 tablets (0.5 mg total) by mouth 2 (two) times daily as needed for anxiety. 90 tablet 1   aspirin  EC 81 MG tablet Take 1 tablet (81 mg total) by mouth daily. 150 tablet 2   atorvastatin  (LIPITOR) 40 MG tablet Take 1 tablet (40 mg total) by mouth daily. 90 tablet 3   clopidogrel  (PLAVIX ) 75 MG tablet Take 1 tablet (75 mg total) by mouth daily. 90 tablet 3   diclofenac  Sodium (VOLTAREN ) 1 % GEL Apply 1 application topically 3 (three) times daily as needed (leg cramps/spasms).     famotidine  (PEPCID ) 20 MG tablet Take 1 tablet (20 mg total) by mouth daily. 90 tablet 3   FLUoxetine  (PROZAC ) 40 MG capsule TAKE (1) CAPSULE BY MOUTH DAILY (Patient taking differently: Take 40 mg by mouth daily.) 90 capsule 3   furosemide  (LASIX ) 20 MG tablet Take 1 tablet (20 mg total) by mouth daily. 90 tablet 3   gabapentin  (NEURONTIN ) 100 MG capsule Take 1-3 capsules (100-300 mg total) by mouth at bedtime as needed. 90 capsule 1   irbesartan  (AVAPRO ) 300 MG tablet Take 1  tablet (300 mg total) by mouth daily. 90 tablet 3   nitroGLYCERIN  (NITROSTAT ) 0.4 MG SL tablet Place 0.4 mg under the tongue every 5 (five) minutes as needed for chest pain.     pantoprazole  (PROTONIX ) 40 MG tablet Take 1 tablet (40 mg total) by mouth daily. Take 30-60 min before first meal of the day 30 tablet 2   tamsulosin  (FLOMAX ) 0.4 MG CAPS capsule Take 1 capsule (0.4 mg total) by mouth daily. 90 capsule 3   Tiotropium Bromide Monohydrate  (SPIRIVA  RESPIMAT) 2.5 MCG/ACT AERS Inhale 2 puffs into the lungs daily. 1 g 5   No current facility-administered medications for this visit.    No Known Allergies  Social History   Socioeconomic History   Marital status: Single    Spouse name: Not on file   Number of children: 2   Years of education: 12   Highest education level: 11th grade  Occupational History   Occupation: Systems analyst   Occupation:  Unemployed    Comment: disability  Tobacco Use   Smoking status: Former    Current packs/day: 0.00    Average packs/day: 1.5 packs/day for 46.0 years (69.0 ttl pk-yrs)    Types: Cigarettes    Start date: 12/11/1964    Quit date: 12/12/2010    Years since quitting: 13.3   Smokeless tobacco: Never  Vaping Use   Vaping status: Never Used  Substance and Sexual Activity   Alcohol use: Yes    Alcohol/week: 4.0 standard drinks of alcohol    Types: 4 Cans of beer per week    Comment: a couple of beer on a weekend   Drug use: Not Currently    Types: Cocaine, Marijuana, Crack cocaine    Comment: over 25 years ago   Sexual activity: Not on file  Other Topics Concern   Not on file  Social History Narrative   Raised in [place], [place]. Does not have any religious beliefs that would effect healthcare. Lives in house with sister. Likes to ride motorcycle for fun.    Moving out on his own; in the next couple of weeks will be moving back home that he had rented      Motorcycles; with a helmet   Social Drivers of Health   Financial Resource  Strain: Medium Risk (10/30/2023)   Overall Financial Resource Strain (CARDIA)    Difficulty of Paying Living Expenses: Somewhat hard  Food Insecurity: No Food Insecurity (10/30/2023)   Hunger Vital Sign    Worried About Running Out of Food in the Last Year: Never true    Ran Out of Food in the Last Year: Never true  Transportation Needs: No Transportation Needs (10/30/2023)   PRAPARE - Administrator, Civil Service (Medical): No    Lack of Transportation (Non-Medical): No  Physical Activity: Insufficiently Active (10/31/2023)   Exercise Vital Sign    Days of Exercise per Week: 3 days    Minutes of Exercise per Session: 30 min  Stress: Stress Concern Present (11/11/2023)   [employer] of Occupational Health - Occupational Stress Questionnaire    Feeling of Stress : Very much  Social Connections: Socially Isolated (10/30/2023)   Social Connection and Isolation Panel    Frequency of Communication with Friends and Family: Never    Frequency of Social Gatherings with Friends and Family: Never    Attends Religious Services: Never    Database administrator or Organizations: No    Attends Engineer, structural: Not on file    Marital Status: Never married  Intimate Partner Violence: Not At Risk (10/31/2023)   Humiliation, Afraid, Rape, and Kick questionnaire    Fear of Current or Ex-Partner: No    Emotionally Abused: No    Physically Abused: No    Sexually Abused: No    Family History  Problem Relation Age of Onset   Hypertension Mother    Deep vein thrombosis Father    Diabetes Brother    Tuberculosis Maternal Grandfather    Emphysema Maternal Grandfather    Lung disease Brother    Asthma Son    Rectal cancer Neg Hx    Colon cancer Neg Hx     Review of Systems:  As stated in the HPI and otherwise negative.   There were no vitals taken for this visit.  Physical Examination: General: Well developed, well nourished, NAD  HEENT: OP clear, mucus membranes  moist  SKIN: warm, dry. No rashes. Neuro: No focal deficits  Musculoskeletal: Muscle strength 5/5 all ext  Psychiatric: Mood and affect normal  Neck: No JVD, no carotid bruits, no thyromegaly, no lymphadenopathy.  Lungs:Clear bilaterally, no wheezes, rhonci, crackles Cardiovascular: Regular rate and rhythm. No murmurs, gallops or rubs. Abdomen:Soft. Bowel sounds present. Non-tender.  Extremities: No lower extremity edema. Pulses are 2 + in the bilateral DP/PT.  EKG:  EKG is *** ordered today. The ekg ordered today demonstrates   Recent Labs: 06/27/2023: Pro B Natriuretic peptide (BNP) 291.0 02/17/2024: ALT 20; BUN 18; Creatinine, Ser 0.72; Hemoglobin 12.8; Platelets 223; Potassium 4.5; Sodium 138; TSH 2.250   Lipid Panel    Component Value Date/Time   CHOL 165 02/17/2024 1002   CHOL 137 06/11/2014 0000   TRIG 105 02/17/2024 1002   TRIG 173 06/11/2014 0000   HDL 60 02/17/2024 1002   CHOLHDL 2.8 02/17/2024 1002   CHOLHDL 2 04/23/2022 0830   VLDL 11.8 04/23/2022 0830   VLDL 35 06/11/2014 0000   LDLCALC 86 02/17/2024 1002     Wt Readings from Last 3 Encounters:  02/17/24 231 lb (104.8 kg)  02/04/24 234 lb (106.1 kg)  01/27/24 234 lb 9.6 oz (106.4 kg)    Assessment and Plan:   1. CAD with angina: Mild CAD by cath in August 2022. PET CT 04/22/24 does not show ischemia. Given fall in LVEF noted by echo and also demonstrated on stress test and with exertional chest pain, will need cardiac cath.  Continue ASA, Plavix  and statin.   2. Chronic diastolic CHF: ***   3. Former Tobacco abuse: He stopped smoking in 2012.   4. Hyperlipidemia: Lipids followed in primary care. LDL ***. Continue statin  5. HTN: BP is well controlled. Continue current therapy  6. PAD: Followed in [facility]. No carotid disease noted on dopplers January 2024. He is on Plavix  for his PAD.   7. LBBB: Chronic  8. Cardiomyopathy: As above, will arrange cardiac cath to exclude progression of CAD. Continue  Irbesartan . *** ? Beta blocker/aldactone  Labs/ tests ordered today include:   No orders of the defined types were placed in this encounter.  Disposition:   F/U with me  in 12 months  Signed, Lonni Cash, MD 04/23/2024 10:28 PM    [facility] [address] [place], [place], [place]  [address] Phone: [phone]; Fax: [phone]

## 2024-04-24 ENCOUNTER — Ambulatory Visit: Attending: Cardiovascular Disease | Admitting: Cardiovascular Disease

## 2024-04-24 VITALS — BP 124/70 | HR 64 | Ht 72.0 in | Wt 220.0 lb

## 2024-04-24 DIAGNOSIS — I428 Other cardiomyopathies: Secondary | ICD-10-CM | POA: Diagnosis not present

## 2024-04-24 DIAGNOSIS — I10 Essential (primary) hypertension: Secondary | ICD-10-CM

## 2024-04-24 DIAGNOSIS — I5022 Chronic systolic (congestive) heart failure: Secondary | ICD-10-CM | POA: Diagnosis not present

## 2024-04-24 DIAGNOSIS — E782 Mixed hyperlipidemia: Secondary | ICD-10-CM | POA: Diagnosis not present

## 2024-04-24 DIAGNOSIS — I447 Left bundle-branch block, unspecified: Secondary | ICD-10-CM

## 2024-04-24 DIAGNOSIS — I2510 Atherosclerotic heart disease of native coronary artery without angina pectoris: Secondary | ICD-10-CM | POA: Diagnosis not present

## 2024-04-24 MED ORDER — CARVEDILOL 3.125 MG PO TABS: 3.1250 mg | ORAL_TABLET | Freq: Two times a day (BID) | ORAL | 3 refills | Status: AC

## 2024-04-24 NOTE — Patient Instructions (Addendum)
 Medication Instructions:  Your physician has recommended you make the following change in your medication: Start Carvedilol  3.125 mg by mouth twice daily   *If you need a refill on your cardiac medications before your next appointment, please call your pharmacy*  Lab Work: Have lab work drawn in the lab on the first floor today--BMP and CBC If you have labs (blood work) drawn today and your tests are completely normal, you will receive your results only by: MyChart Message (if you have MyChart) OR A paper copy in the mail If you have any lab test that is abnormal or we need to change your treatment, we will call you to review the results.  Testing/Procedures: Your physician has requested that you have an echocardiogram. Echocardiography is a painless test that uses sound waves to create images of your heart. It provides your doctor with information about the size and shape of your heart and how well your heart's chambers and valves are working. This procedure takes approximately one hour. There are no restrictions for this procedure. Please do NOT wear cologne, perfume, aftershave, or lotions (deodorant is allowed). Please arrive 15 minutes prior to your appointment time.  Please note: We ask at that you not bring children with you during ultrasound (echo/ vascular) testing. Due to room size and safety concerns, children are not allowed in the ultrasound rooms during exams. Our front office staff cannot provide observation of children in our lobby area while testing is being conducted. An adult accompanying a patient to their appointment will only be allowed in the ultrasound room at the discretion of the ultrasound technician under special circumstances. We apologize for any inconvenience.   Follow-Up: At [facility], you and your health needs are our priority.  As part of our continuing mission to provide you with exceptional heart care, our providers are all part of one team.  This  team includes your primary Cardiologist (physician) and Advanced Practice Providers or APPs (Physician Assistants and Nurse Practitioners) who all work together to provide you with the care you need, when you need it.  Your next appointment:   To be determined after procedure  Provider:   Lonni Cash, MD or APP    We recommend signing up for the patient portal called MyChart.  Sign up information is provided on this After Visit Summary.  MyChart is used to connect with patients for Virtual Visits (Telemedicine).  Patients are able to view lab/test results, encounter notes, upcoming appointments, etc.  Non-urgent messages can be sent to your provider as well.   To learn more about what you can do with MyChart, go to [email or web address].   Other Instructions You have been referred to CHF clinic--Please schedule appointment for about 6 weeks from now   [place] [facility] A DEPT OF [facility] AT [address] A DEPT OF THE [place] [address] [place] [address] Dept: [phone] Loc: [phone]  Brayant Dorr Kell  04/24/2024  You are scheduled for a Cardiac Catheterization on Wednesday, September 24 with Dr. Lonni Cash.  1. Please arrive at the [facility] (Main Entrance A) at [facility]: [address] [place], [place] [address] at 11:00 AM (This time is 2 hour(s) before your procedure to ensure your preparation).   Free valet parking service is available. You will check in at ADMITTING. The support person will be asked to wait in the waiting room.  It is OK to have  someone drop you off and come back when you are ready to be discharged.    Special note: Every effort is made to have your procedure done on time. Please understand that emergencies sometimes delay scheduled procedures.  2. Diet: Nothing to eat after midnight.   3. Hydration: You need to be well hydrated before your procedure. On September  24, you may drink approved liquids (see below) until 2 hours before the procedure, with 16 oz of water  as your last intake.   List of approved liquids water , clear juice, clear tea, black coffee, fruit juices, non-citric and without pulp, carbonated beverages, Gatorade, Kool -Aid, plain Jello-O and plain ice popsicles.  4. Labs: done on 9/12  5. Medication instructions in preparation for your procedure: Do not take furosemide  the day of the procedure   Contrast Allergy: No   On the morning of your procedure, take your Aspirin  81 mg and Plavix /Clopidogrel  and any morning medicines NOT listed above.  You may use sips of water .  6. Plan to go home the same day, you will only stay overnight if medically necessary. 7. Bring a current list of your medications and current insurance cards. 8. You MUST have a responsible person to drive you home. 9. Someone MUST be with you the first 24 hours after you arrive home or your discharge will be delayed. 10. Please wear clothes that are easy to get on and off and wear slip-on shoes.  Thank you for allowing us  to care for you!   -- [facility]

## 2024-04-25 LAB — BASIC METABOLIC PANEL WITH GFR
BUN/Creatinine Ratio: 26 — ABNORMAL HIGH (ref 10–24)
BUN: 19 mg/dL (ref 8–27)
CO2: 22 mmol/L (ref 20–29)
Calcium: 9.5 mg/dL (ref 8.6–10.2)
Chloride: 99 mmol/L (ref 96–106)
Creatinine, Ser: 0.72 mg/dL — ABNORMAL LOW (ref 0.76–1.27)
Glucose: 79 mg/dL (ref 70–99)
Potassium: 4.5 mmol/L (ref 3.5–5.2)
Sodium: 137 mmol/L (ref 134–144)
eGFR: 99 mL/min/1.73 (ref >59)

## 2024-04-25 LAB — CBC
Hematocrit: 44.8 % (ref 37.5–51.0)
Hemoglobin: 14.6 g/dL (ref 13.0–17.7)
MCH: 31.5 pg (ref 26.6–33.0)
MCHC: 32.6 g/dL (ref 31.5–35.7)
MCV: 97 fL (ref 79–97)
Platelets: 273 x10E3/uL (ref 150–450)
RBC: 4.64 x10E6/uL (ref 4.14–5.80)
RDW: 11.6 % (ref 11.6–15.4)
WBC: 6.8 x10E3/uL (ref 3.4–10.8)

## 2024-04-27 ENCOUNTER — Ambulatory Visit: Payer: Self-pay | Admitting: *Deleted

## 2024-04-27 ENCOUNTER — Ambulatory Visit: Admitting: Pulmonary Disease

## 2024-04-27 DIAGNOSIS — I25722 Atherosclerosis of autologous artery coronary artery bypass graft(s) with refractory angina pectoris: Secondary | ICD-10-CM | POA: Diagnosis not present

## 2024-04-27 DIAGNOSIS — E669 Obesity, unspecified: Secondary | ICD-10-CM | POA: Diagnosis not present

## 2024-04-27 DIAGNOSIS — I110 Hypertensive heart disease with heart failure: Secondary | ICD-10-CM | POA: Diagnosis not present

## 2024-04-27 DIAGNOSIS — G629 Polyneuropathy, unspecified: Secondary | ICD-10-CM | POA: Diagnosis not present

## 2024-04-27 DIAGNOSIS — G4730 Sleep apnea, unspecified: Secondary | ICD-10-CM | POA: Diagnosis not present

## 2024-04-27 DIAGNOSIS — E785 Hyperlipidemia, unspecified: Secondary | ICD-10-CM | POA: Diagnosis not present

## 2024-04-27 DIAGNOSIS — I739 Peripheral vascular disease, unspecified: Secondary | ICD-10-CM | POA: Diagnosis not present

## 2024-04-27 DIAGNOSIS — E261 Secondary hyperaldosteronism: Secondary | ICD-10-CM | POA: Diagnosis not present

## 2024-04-27 DIAGNOSIS — F330 Major depressive disorder, recurrent, mild: Secondary | ICD-10-CM | POA: Diagnosis not present

## 2024-04-27 DIAGNOSIS — F419 Anxiety disorder, unspecified: Secondary | ICD-10-CM | POA: Diagnosis not present

## 2024-04-27 DIAGNOSIS — J449 Chronic obstructive pulmonary disease, unspecified: Secondary | ICD-10-CM | POA: Diagnosis not present

## 2024-04-27 DIAGNOSIS — I509 Heart failure, unspecified: Secondary | ICD-10-CM | POA: Diagnosis not present

## 2024-04-30 ENCOUNTER — Ambulatory Visit: Admitting: Pulmonary Disease

## 2024-04-30 ENCOUNTER — Ambulatory Visit (INDEPENDENT_AMBULATORY_CARE_PROVIDER_SITE_OTHER): Admitting: Pulmonary Disease

## 2024-04-30 ENCOUNTER — Encounter: Payer: Self-pay | Admitting: Pulmonary Disease

## 2024-04-30 VITALS — BP 151/85 | HR 65 | Ht 72.0 in | Wt 221.0 lb

## 2024-04-30 DIAGNOSIS — Z87891 Personal history of nicotine dependence: Secondary | ICD-10-CM

## 2024-04-30 DIAGNOSIS — J431 Panlobular emphysema: Secondary | ICD-10-CM | POA: Diagnosis not present

## 2024-04-30 DIAGNOSIS — G4733 Obstructive sleep apnea (adult) (pediatric): Secondary | ICD-10-CM | POA: Diagnosis not present

## 2024-04-30 NOTE — Patient Instructions (Signed)
 I will see you back in 6 months  Make sure you have albuterol  available for rescue  If we can get to doing that sleep study whenever you are able to will be fine  We did adjust her levels today  We can still advance therapy as tolerated  Call us  with significant concerns  New voltage level of 1.9 is a level 7

## 2024-04-30 NOTE — Progress Notes (Addendum)
 They should have 1 is              Todd Weeks    [id number]    1956/03/06  Primary Care Physician:Parker, Worth HERO, MD  Referring Physician: Kennyth Worth HERO, MD [address] [place],  [place] [address]  Chief complaint:   Patient being seen for obstructive sleep apnea  HPI:  Patient with obstructive sleep apnea who is currently using an inspire device for management  Continues to tolerate his device well Using device every night  Inspire device implantation was 05/13/2023 Inspire device activation was 07/01/2023  He has yet to complete a titration study  No reports of changes to his voice, no swallowing difficulty not, no significant discomfort around implantation site  Has had multiple procedures recently  He feels he is sleeping well and benefiting from the inspire device  Works [employer] hours during the day about 12 hours about 6 days a week Shortness of breath has improved with being on diuretics, follows up with cardiologist  His breathing has been very stable Has not been using any inhalers, has not needed to use his albuterol    Outpatient Encounter Medications as of 04/30/2024  Medication Sig   albuterol  (VENTOLIN  HFA) 108 (90 Base) MCG/ACT inhaler Inhale 2 puffs into the lungs every 6 (six) hours as needed for wheezing or shortness of breath.   ALPRAZolam  (XANAX ) 0.25 MG tablet Take 2 tablets (0.5 mg total) by mouth 2 (two) times daily as needed for anxiety.   aspirin  EC 81 MG tablet Take 1 tablet (81 mg total) by mouth daily.   atorvastatin  (LIPITOR) 40 MG tablet Take 1 tablet (40 mg total) by mouth daily.   carvedilol  (COREG ) 3.125 MG tablet Take 1 tablet (3.125 mg total) by mouth 2 (two) times daily with a meal.   clopidogrel  (PLAVIX ) 75 MG tablet Take 1 tablet (75 mg total) by mouth daily.   diclofenac  Sodium (VOLTAREN ) 1 % GEL Apply 1 application topically 3 (three) times daily as needed (leg cramps/spasms).   famotidine  (PEPCID ) 20 MG tablet Take 1 tablet (20 mg  total) by mouth daily.   FLUoxetine  (PROZAC ) 40 MG capsule TAKE (1) CAPSULE BY MOUTH DAILY   furosemide  (LASIX ) 20 MG tablet Take 1 tablet (20 mg total) by mouth daily.   gabapentin  (NEURONTIN ) 100 MG capsule Take 1-3 capsules (100-300 mg total) by mouth at bedtime as needed. (Patient not taking: Reported on 04/24/2024)   irbesartan  (AVAPRO ) 300 MG tablet Take 1 tablet (300 mg total) by mouth daily.   nitroGLYCERIN  (NITROSTAT ) 0.4 MG SL tablet Place 0.4 mg under the tongue every 5 (five) minutes as needed for chest pain.   pantoprazole  (PROTONIX ) 40 MG tablet Take 1 tablet (40 mg total) by mouth daily. Take 30-60 min before first meal of the day   tamsulosin  (FLOMAX ) 0.4 MG CAPS capsule Take 1 capsule (0.4 mg total) by mouth daily.   Tiotropium Bromide Monohydrate  (SPIRIVA  RESPIMAT) 2.5 MCG/ACT AERS Inhale 2 puffs into the lungs daily.   No facility-administered encounter medications on file as of 04/30/2024.    Allergies as of 04/30/2024   (No Known Allergies)    Past Medical History:  Diagnosis Date   Bell's palsy    Cataract    Mixed form OU   Closed nondisplaced fracture of neck of third metacarpal bone of right hand 02/04/2017   Clotting disorder (HCC)    COPD (chronic obstructive pulmonary disease) (HCC)    Depression    Diverticulitis  GERD (gastroesophageal reflux disease)    Headache(784.0)    HTN (hypertension)    Hyperlipidemia    Hypertensive retinopathy    OU   Mild CAD 2012   PVD (peripheral vascular disease) (HCC)    Retinal detachment    Sexual dysfunction    Sleep apnea     Past Surgical History:  Procedure Laterality Date   ABDOMINAL AORTOGRAM W/LOWER EXTREMITY Bilateral 02/05/2020   Procedure: ABDOMINAL AORTOGRAM W/LOWER EXTREMITY;  Surgeon: Harvey Carlin BRAVO, MD;  Location: [facility];  Service: Cardiovascular;  Laterality: Bilateral;   ABDOMINAL AORTOGRAM W/LOWER EXTREMITY N/A 12/16/2020   Procedure: ABDOMINAL AORTOGRAM W/LOWER EXTREMITY;   Surgeon: Harvey Carlin BRAVO, MD;  Location: [facility];  Service: Cardiovascular;  Laterality: N/A;   CATARACT EXTRACTION     DRUG INDUCED ENDOSCOPY Right 01/15/2023   Procedure: DRUG INDUCED SLEEP  ENDOSCOPY;  Surgeon: Carlie Clark, MD;  Location: [facility];  Service: ENT;  Laterality: Right;   EYE SURGERY Right 05/06/2020   Pneumatic retinopexy for rheg. RD repair - Dr. Redell Hans   EYE SURGERY Right 05/26/2020   PPV/MP - Dr. Redell Hans   GAS INSERTION Right 05/26/2020   Procedure: INSERTION OF GAS;  Surgeon: Hans Redell, MD;  Location: [facility] OR;  Service: Ophthalmology;  Laterality: Right;   IMPLANTATION OF HYPOGLOSSAL NERVE STIMULATOR Right 05/13/2023   Procedure: IMPLANTATION OF HYPOGLOSSAL NERVE STIMULATOR;  Surgeon: Carlie Clark, MD;  Location: [facility];  Service: ENT;  Laterality: Right;   INGUINAL HERNIA REPAIR Right 05/29/2021   Procedure: RIGHT INGUINAL HERNIA REPAIR WITH MESH;  Surgeon: Curvin Deward MOULD, MD;  Location: [facility];  Service: General;  Laterality: Right;  TAP BLOCK   LEFT HEART CATH AND CORONARY ANGIOGRAPHY N/A 04/05/2021   Procedure: LEFT HEART CATH AND CORONARY ANGIOGRAPHY;  Surgeon: Verlin Lonni BIRCH, MD;  Location: [facility];  Service: Cardiovascular;  Laterality: N/A;   MEMBRANE PEEL Right 05/26/2020   Procedure: MEMBRANE PEEL;  Surgeon: Hans Redell, MD;  Location: [facility] OR;  Service: Ophthalmology;  Laterality: Right;   PARS PLANA VITRECTOMY Right 05/26/2020   Procedure: PARS PLANA VITRECTOMY WITH 25 GAUGE;  Surgeon: Hans Redell, MD;  Location: [facility] OR;  Service: Ophthalmology;  Laterality: Right;   PERIPHERAL VASCULAR INTERVENTION Right 12/16/2020   Procedure: PERIPHERAL VASCULAR INTERVENTION;  Surgeon: Harvey Carlin BRAVO, MD;  Location: [facility];  Service: Cardiovascular;  Laterality: Right;  SFA (distal)   PHOTOCOAGULATION Right 05/26/2020   Procedure: PHOTOCOAGULATION;  Surgeon:  Hans Redell, MD;  Location: [facility] OR;  Service: Ophthalmology;  Laterality: Right;   PROSTATE BIOPSY  2015   RETINAL DETACHMENT SURGERY Right 05/06/2020   Pneumatic retinopexy for rheg RD repair - Dr. Redell Hans   RETINAL DETACHMENT SURGERY Right 05/26/2020   PPV/MP - Dr. Redell Hans    Family History  Problem Relation Age of Onset   Hypertension Mother    Deep vein thrombosis Father    Diabetes Brother    Tuberculosis Maternal Grandfather    Emphysema Maternal Grandfather    Lung disease Brother    Asthma Son    Rectal cancer Neg Hx    Colon cancer Neg Hx     Social History   Socioeconomic History   Marital status: Single    Spouse name: Not on file   Number of children: 2   Years of education: 12   Highest education level: 11th grade  Occupational History  Occupation: Systems analyst   Occupation: Unemployed    Comment: disability  Tobacco Use   Smoking status: Former    Current packs/day: 0.00    Average packs/day: 1.5 packs/day for 46.0 years (69.0 ttl pk-yrs)    Types: Cigarettes    Start date: 12/11/1964    Quit date: 12/12/2010    Years since quitting: 13.3   Smokeless tobacco: Never  Vaping Use   Vaping status: Never Used  Substance and Sexual Activity   Alcohol use: Yes    Alcohol/week: 4.0 standard drinks of alcohol    Types: 4 Cans of beer per week    Comment: a couple of beer on a weekend   Drug use: Not Currently    Types: Cocaine, Marijuana, Crack cocaine    Comment: over 25 years ago   Sexual activity: Not on file  Other Topics Concern   Not on file  Social History Narrative   Raised in [place], [place]. Does not have any religious beliefs that would effect healthcare. Lives in house with sister. Likes to ride motorcycle for fun.    Moving out on his own; in the next couple of weeks will be moving back home that he had rented      Motorcycles; with a helmet   Social Drivers of Health   Financial Resource Strain: Medium Risk (10/30/2023)    Overall Financial Resource Strain (CARDIA)    Difficulty of Paying Living Expenses: Somewhat hard  Food Insecurity: No Food Insecurity (10/30/2023)   Hunger Vital Sign    Worried About Running Out of Food in the Last Year: Never true    Ran Out of Food in the Last Year: Never true  Transportation Needs: No Transportation Needs (10/30/2023)   PRAPARE - Administrator, Civil Service (Medical): No    Lack of Transportation (Non-Medical): No  Physical Activity: Insufficiently Active (10/31/2023)   Exercise Vital Sign    Days of Exercise per Week: 3 days    Minutes of Exercise per Session: 30 min  Stress: Stress Concern Present (11/11/2023)   [employer] of Occupational Health - Occupational Stress Questionnaire    Feeling of Stress : Very much  Social Connections: Socially Isolated (10/30/2023)   Social Connection and Isolation Panel    Frequency of Communication with Friends and Family: Never    Frequency of Social Gatherings with Friends and Family: Never    Attends Religious Services: Never    Database administrator or Organizations: No    Attends Engineer, structural: Not on file    Marital Status: Never married  Intimate Partner Violence: Not At Risk (10/31/2023)   Humiliation, Afraid, Rape, and Kick questionnaire    Fear of Current or Ex-Partner: No    Emotionally Abused: No    Physically Abused: No    Sexually Abused: No    Review of Systems  Respiratory:  Positive for apnea. Negative for shortness of breath.   Psychiatric/Behavioral:  Positive for sleep disturbance.     There were no vitals filed for this visit.    Physical Exam Constitutional:      Appearance: He is obese.  HENT:     Head: Normocephalic.     Nose: Nose normal.  Eyes:     General: No scleral icterus.    Pupils: Pupils are equal, round, and reactive to light.  Cardiovascular:     Rate and Rhythm: Normal rate and regular rhythm.     Heart sounds: No murmur  heard.    No  friction rub.  Pulmonary:     Effort: No respiratory distress.     Breath sounds: No stridor. No wheezing or rhonchi.  Musculoskeletal:     Cervical back: No rigidity or tenderness.  Neurological:     Mental Status: He is alert.  Psychiatric:        Mood and Affect: Mood normal.      Data Reviewed: Inspire device interrogation was performed today Device was tested at various levels Voltage ranged from 1.5-2.5  Functional range-1.3-2.3 Current amplitude-1.9 V Delay time-60 minutes Pause duration-30 minutes Therapy duration-7 hours  Pulmonary function test was reviewed -Significant for mild obstructive disease compared to previous studies  Inspire compliance report noted He is on a voltage of 2.2, 90% use  Assessment:  Mild obstructive lung disease -Has not been using any inhalers - Has not needed albuterol  - Will discontinue Spiriva   Obstructive sleep apnea -Continue inspire device use - Voltage was adjusted today to 1.9 V  Emphysema -Has been stable, has not needed any inhalers  Plan/Recommendations:  Titrate voltage as tolerated  Higher voltage was not well-tolerated associated with tongue discomfort Cumulative voltage of 2.2 this was decreased to 1.5 as this showed adequate tongue movement  Encouraged to titrate as tolerated  Encouraged to get enough hours of sleep  Encouraged to try and get his titration study performed-this is to ascertain that he is on the correct voltage  Encouraged to make sure he has albuterol  up available for rescue  Call us  with significant concerns  I spent 40 minutes dedicated to the care of this patient on the date of this encounter to include previsit review of records, face-to-face time with the patient discussing conditions above, post visit ordering of testing,ordering medications,independentlyinterpreting results, clinical documentation with electronic health record and communicated necessary findings to members of the  patient's care team  Jennet Epley MD [facility] 04/30/2024, 3:23 PM  CC: Kennyth Worth HERO, MD

## 2024-05-03 ENCOUNTER — Other Ambulatory Visit: Payer: Self-pay | Admitting: Pulmonary Disease

## 2024-05-04 NOTE — Telephone Encounter (Signed)
 Pt requesting refill of Xanax . Please advise, thank you!

## 2024-05-05 ENCOUNTER — Telehealth: Payer: Self-pay | Admitting: *Deleted

## 2024-05-05 NOTE — Telephone Encounter (Signed)
 Cardiac Catheterization scheduled at [facility] for: Wednesday May 06, 2024 1 PM Arrival time [facility] Main Entrance A at: 11 AM  Diet: -May have light meal until 7 AM. (6 hours before procedure time) Approved light meal consists of plain toast, fruit, light soups, crackers.  Hydration: -May drink clear liquids until 2 hours before the procedure.  Approved liquids: Water , clear tea, black coffee, fruit juices-non-citric and without pulp,Gatorade, plain Jello/popsicles.   -Please drink 16 oz of water  2 hours before procedure.  Medication instructions: -Hold:  Lasix -AM of procedure -Other usual morning medications can be taken including aspirin  81 mg and Plavix  75 mg  Plan to go home the same day, you will only stay overnight if medically necessary.  You must have responsible adult to drive you home.  Someone must be with you the first 24 hours after you arrive home.  Reviewed procedure instructions with patient.

## 2024-05-06 ENCOUNTER — Encounter (HOSPITAL_COMMUNITY)
Admission: RE | Disposition: A | Discharge status: Home / Self Care | Payer: Self-pay | Source: Home / Self Care | Attending: Cardiovascular Disease

## 2024-05-06 ENCOUNTER — Other Ambulatory Visit: Payer: Self-pay

## 2024-05-06 ENCOUNTER — Ambulatory Visit (HOSPITAL_COMMUNITY)
Admission: RE | Admit: 2024-05-06 | Discharge: 2024-05-06 | Disposition: A | Discharge status: Home / Self Care | Attending: Cardiovascular Disease | Admitting: Cardiovascular Disease

## 2024-05-06 DIAGNOSIS — I110 Hypertensive heart disease with heart failure: Secondary | ICD-10-CM | POA: Diagnosis not present

## 2024-05-06 DIAGNOSIS — Z79899 Other long term (current) drug therapy: Secondary | ICD-10-CM | POA: Insufficient documentation

## 2024-05-06 DIAGNOSIS — Z5986 Financial insecurity: Secondary | ICD-10-CM | POA: Insufficient documentation

## 2024-05-06 DIAGNOSIS — I5032 Chronic diastolic (congestive) heart failure: Secondary | ICD-10-CM | POA: Diagnosis not present

## 2024-05-06 DIAGNOSIS — I428 Other cardiomyopathies: Secondary | ICD-10-CM | POA: Diagnosis not present

## 2024-05-06 DIAGNOSIS — I447 Left bundle-branch block, unspecified: Secondary | ICD-10-CM | POA: Diagnosis not present

## 2024-05-06 DIAGNOSIS — Z7982 Long term (current) use of aspirin: Secondary | ICD-10-CM | POA: Insufficient documentation

## 2024-05-06 DIAGNOSIS — E785 Hyperlipidemia, unspecified: Secondary | ICD-10-CM | POA: Insufficient documentation

## 2024-05-06 DIAGNOSIS — Z7902 Long term (current) use of antithrombotics/antiplatelets: Secondary | ICD-10-CM | POA: Diagnosis not present

## 2024-05-06 DIAGNOSIS — Z555 Less than a high school diploma: Secondary | ICD-10-CM | POA: Insufficient documentation

## 2024-05-06 DIAGNOSIS — Z736 Limitation of activities due to disability: Secondary | ICD-10-CM | POA: Diagnosis not present

## 2024-05-06 DIAGNOSIS — Z604 Social exclusion and rejection: Secondary | ICD-10-CM | POA: Diagnosis not present

## 2024-05-06 DIAGNOSIS — I2510 Atherosclerotic heart disease of native coronary artery without angina pectoris: Secondary | ICD-10-CM | POA: Diagnosis not present

## 2024-05-06 DIAGNOSIS — Z87891 Personal history of nicotine dependence: Secondary | ICD-10-CM | POA: Insufficient documentation

## 2024-05-06 HISTORY — PX: RIGHT/LEFT HEART CATH AND CORONARY ANGIOGRAPHY: CATH118266

## 2024-05-06 LAB — POCT I-STAT EG7
Acid-base deficit: 1 mmol/L (ref 0.0–2.0)
Bicarbonate: 23.4 mmol/L (ref 20.0–28.0)
Calcium, Ion: 1.08 mmol/L — ABNORMAL LOW (ref 1.15–1.40)
HCT: 36 % — ABNORMAL LOW (ref 39.0–52.0)
Hemoglobin: 12.2 g/dL — ABNORMAL LOW (ref 13.0–17.0)
O2 Saturation: 78 %
Potassium: 3.5 mmol/L (ref 3.5–5.1)
Sodium: 140 mmol/L (ref 135–145)
TCO2: 25 mmol/L (ref 22–32)
pCO2, Ven: 38.5 mmHg — ABNORMAL LOW (ref 44–60)
pH, Ven: 7.393 (ref 7.25–7.43)
pO2, Ven: 43 mmHg (ref 32–45)

## 2024-05-06 SURGERY — RIGHT/LEFT HEART CATH AND CORONARY ANGIOGRAPHY
Anesthesia: LOCAL

## 2024-05-06 MED ORDER — ASPIRIN 81 MG PO CHEW: 81.0000 mg | CHEWABLE_TABLET | ORAL | Status: DC

## 2024-05-06 MED ORDER — LIDOCAINE HCL (PF) 1 % IJ SOLN
INTRAMUSCULAR | Status: AC
Start: 2024-05-06 — End: 2024-05-06
  Filled 2024-05-06: qty 30

## 2024-05-06 MED ORDER — MIDAZOLAM HCL 2 MG/2ML IJ SOLN
INTRAMUSCULAR | Status: DC | PRN
  Administered 2024-05-06: 2 mg via INTRAVENOUS

## 2024-05-06 MED ORDER — FREE WATER: 500.0000 mL | Freq: Once | Status: DC

## 2024-05-06 MED ORDER — FENTANYL CITRATE (PF) 100 MCG/2ML IJ SOLN
INTRAMUSCULAR | Status: AC
  Filled 2024-05-06: qty 2

## 2024-05-06 MED ORDER — FENTANYL CITRATE (PF) 100 MCG/2ML IJ SOLN
INTRAMUSCULAR | Status: DC | PRN
  Administered 2024-05-06: 50 ug via INTRAVENOUS

## 2024-05-06 MED ORDER — IOHEXOL 350 MG/ML SOLN
INTRAVENOUS | Status: DC | PRN
  Administered 2024-05-06: 30 mL

## 2024-05-06 MED ORDER — SODIUM CHLORIDE 0.9% FLUSH: 3.0000 mL | Freq: Two times a day (BID) | INTRAVENOUS | Status: DC

## 2024-05-06 MED ORDER — CLOPIDOGREL BISULFATE 75 MG PO TABS
75.0000 mg | ORAL_TABLET | Freq: Once | ORAL | Status: DC
Start: 2024-05-06 — End: 2024-05-06

## 2024-05-06 MED ORDER — LIDOCAINE HCL (PF) 1 % IJ SOLN
INTRAMUSCULAR | Status: DC | PRN
  Administered 2024-05-06 (×2): 2 mL via INTRADERMAL

## 2024-05-06 MED ORDER — SODIUM CHLORIDE 0.9 % IV SOLN: 250.0000 mL | INTRAVENOUS | Status: DC | PRN

## 2024-05-06 MED ORDER — VERAPAMIL HCL 2.5 MG/ML IV SOLN
INTRAVENOUS | Status: AC
  Filled 2024-05-06: qty 2

## 2024-05-06 MED ORDER — HEPARIN (PORCINE) IN NACL 1000-0.9 UT/500ML-% IV SOLN
INTRAVENOUS | Status: DC | PRN
  Administered 2024-05-06 (×2): 500 mL

## 2024-05-06 MED ORDER — HEPARIN SODIUM (PORCINE) 1000 UNIT/ML IJ SOLN
INTRAMUSCULAR | Status: DC | PRN
  Administered 2024-05-06: 5000 [IU] via INTRAVENOUS

## 2024-05-06 MED ORDER — MIDAZOLAM HCL 2 MG/2ML IJ SOLN
INTRAMUSCULAR | Status: AC
  Filled 2024-05-06: qty 2

## 2024-05-06 MED ORDER — SODIUM CHLORIDE 0.9% FLUSH: 3.0000 mL | INTRAVENOUS | Status: DC | PRN

## 2024-05-06 MED ORDER — HEPARIN SODIUM (PORCINE) 1000 UNIT/ML IJ SOLN
INTRAMUSCULAR | Status: AC
Start: 2024-05-06 — End: 2024-05-06
  Filled 2024-05-06: qty 10

## 2024-05-06 MED ORDER — VERAPAMIL HCL 2.5 MG/ML IV SOLN
INTRAVENOUS | Status: DC | PRN
  Administered 2024-05-06: 10 mL via INTRA_ARTERIAL

## 2024-05-06 SURGICAL SUPPLY — 9 items
CATH 5FR JL3.5 JR4 ANG PIG MP (CATHETERS) IMPLANT
CATH BALLN WEDGE 5F 110CM (CATHETERS) IMPLANT
DEVICE RAD COMP TR BAND LRG (VASCULAR PRODUCTS) IMPLANT
GLIDESHEATH SLEND SS 6F .021 (SHEATH) IMPLANT
GUIDEWIRE INQWIRE 1.5J.035X260 (WIRE) IMPLANT
PACK CARDIAC CATHETERIZATION (CUSTOM PROCEDURE TRAY) ×1 IMPLANT
SET ATX-X65L (MISCELLANEOUS) IMPLANT
SHEATH GLIDE SLENDER 4/5FR (SHEATH) IMPLANT
WIRE MICRO SET SILHO 5FR 7 (SHEATH) IMPLANT

## 2024-05-06 NOTE — Discharge Instructions (Addendum)

## 2024-05-06 NOTE — Interval H&P Note (Signed)
 History and Physical Interval Note:  05/06/2024 1:47 PM  Shelley Pooley Kapaun  has presented today for surgery, with the diagnosis of CM.  The various methods of treatment have been discussed with the patient and family. After consideration of risks, benefits and other options for treatment, the patient has consented to  Procedure(s): RIGHT/LEFT HEART CATH AND CORONARY ANGIOGRAPHY (N/A) as a surgical intervention.  The patient's history has been reviewed, patient examined, no change in status, stable for surgery.  I have reviewed the patient's chart and labs.  Questions were answered to the patient's satisfaction.    Cath Lab Visit (complete for each Cath Lab visit)  Clinical Evaluation Leading to the Procedure:   ACS: No.  Non-ACS:    Anginal Classification: CCS II  Anti-ischemic medical therapy: Minimal Therapy (1 class of medications)  Non-Invasive Test Results: High-risk stress test findings: cardiac mortality >3%/year  Prior CABG: No previous CABG    Lonni Cash

## 2024-05-06 NOTE — Progress Notes (Signed)
 TR Band removed, no s/s of complications at the incision site. Discharge instructions reviewed with pt and Son Todd Weeks and His wife Todd Weeks at the bedside. All questions and concerns addressed. Verbalized understanding. PT ambulated in the hall to the bathroom, no concerns noted. Was able to tolerate fluids and PO intake. Voided in the bathroom without difficulty. PT was escorted from the unit via wheel chair to personal vehicle.

## 2024-05-07 ENCOUNTER — Encounter (HOSPITAL_COMMUNITY): Payer: Self-pay | Admitting: Cardiovascular Disease

## 2024-05-07 ENCOUNTER — Encounter: Payer: Self-pay | Admitting: Pharmacist

## 2024-05-07 ENCOUNTER — Other Ambulatory Visit: Payer: Self-pay | Admitting: *Deleted

## 2024-05-07 LAB — POCT I-STAT 7, (LYTES, BLD GAS, ICA,H+H)
Acid-base deficit: 2 mmol/L (ref 0.0–2.0)
Bicarbonate: 22 mmol/L (ref 20.0–28.0)
Calcium, Ion: 1.11 mmol/L — ABNORMAL LOW (ref 1.15–1.40)
HCT: 36 % — ABNORMAL LOW (ref 39.0–52.0)
Hemoglobin: 12.2 g/dL — ABNORMAL LOW (ref 13.0–17.0)
O2 Saturation: 98 %
Potassium: 3.6 mmol/L (ref 3.5–5.1)
Sodium: 139 mmol/L (ref 135–145)
TCO2: 23 mmol/L (ref 22–32)
pCO2 arterial: 34.2 mmHg (ref 32–48)
pH, Arterial: 7.418 (ref 7.35–7.45)
pO2, Arterial: 111 mmHg — ABNORMAL HIGH (ref 83–108)

## 2024-05-07 MED ORDER — NITROGLYCERIN 0.4 MG SL SUBL: 0.4000 mg | SUBLINGUAL_TABLET | SUBLINGUAL | 6 refills | Status: AC | PRN

## 2024-05-07 NOTE — Progress Notes (Signed)
 Pharmacy Quality Measure Review  This patient is appearing on a report for being at risk of failing the adherence measure for hypertension (ACEi/ARB) medications this calendar year.   Medication: Irbesartan  Last fill date: 04/20/24 for 90 day supply  Insurance report was not up to date. No action needed at this time.   Darrelyn Drum, PharmD, BCPS, CPP Clinical Pharmacist Practitioner [facility] [phone]

## 2024-05-31 NOTE — Progress Notes (Signed)
   ADVANCED HEART FAILURE NEW PATIENT CLINIC NOTE  Referring Physician: Kennyth Worth HERO, MD  Primary Care: Kennyth Worth HERO, MD Primary Cardiologist:  HPI: Todd Weeks is a 68 y.o. male with a PMH of PAD, HFrEF who presents for initial visit for further evaluation and treatment of heart failure/cardiomyopathy.      Patient follows with Dr. Verlin. Prior history of CAD and PAD, underwent stenting of right superficial femoral artery, known occlusion on the left. Worsening dyspnea 2024 with echo at that time showing new reduction in LVEF to 25-30% and moderate MR. Stress test arranged that showed no ischemia. Underwent cardiac catheterization that showed NICM and was arranged to follow up in HF clinic.       SUBJECTIVE:  Patient reports that overall he is feeling fair. He reports some occasional lower extremity swelling that has gotten better with the lasix . He has been breathing better at work and while moving around. He denies any previous cardiac issues, strong family history of atherosclerotic disease. Can not identify a clear trigger or change in his symptoms, have just been slowly worsening for some time now. Has had COVID multiple times, but no severe infections.   PMH, current medications, allergies, social history, and family history reviewed in epic.  PHYSICAL EXAM: Vitals:   06/01/24 0934  BP: 128/86  Pulse: (!) 58  SpO2: 98%   GENERAL: Well nourished and in no apparent distress at rest.  PULM:  Normal work of breathing, clear to auscultation bilaterally. Respirations are unlabored.  CARDIAC:  JVP: flat         Normal rate with regular rhythm. No murmurs, rubs or gallops.  Trace edema. Warm and well perfused extremities. ABDOMEN: Soft, non-tender, non-distended. NEUROLOGIC: Patient is oriented x3 with no focal or lateralizing neurologic deficits.    DATA REVIEW  ECG: 02/17/2024: NSR, LBBB  ECHO: 08/06/2023: LVEF 25 to 30%, severely dilated LV, mildly reduced RV  function, moderate MR 05/2024: LVEF 30 to 35%, moderately dilated LV, normal RV, moderate MR  CATH: 05/06/2024: Moderate RCA disease, otherwise no significant obstructive disease.   ASSESSMENT & PLAN:  Chronic systolic heart failure: NICM based on recent cardiac catheterization, dilation suggests some chronicity. No significant risk factors for  - CMR ordered - ICD risk benefit discussion, will wait for MR for further risk stratification - carvedilol  3.125mg  BID - If EF fails to recover would likely benefit from evaluation for CRT-D given left bundle appearance on EKG - start farxiga 10mg  daily - Start entresto 24/26mg  BID, stop irbestartan - Change lasix  to as needed - MRA hopefully at next visit  CAD: Moderate RCA disease, otherwise mild CAD. - Continue aspirin , plavix , atorvastatin  80mg  daily  PAD:  - Continue aspirin /plavix   Former tobacco use:  - Stopped in 2012  LBBB: Will obtain CMR and titrate medical therapy. If EF still reduced --> CRT-D  Follow up in 2-3 months, follow upw with pharmacy for med titration  Morene Brownie, MD Advanced Heart Failure Mechanical Circulatory Support 06/01/24

## 2024-06-01 ENCOUNTER — Ambulatory Visit (HOSPITAL_BASED_OUTPATIENT_CLINIC_OR_DEPARTMENT_OTHER)
Admission: RE | Admit: 2024-06-01 | Discharge: 2024-06-01 | Disposition: A | Discharge status: Home / Self Care | Source: Ambulatory Visit | Attending: Cardiology | Admitting: Cardiology

## 2024-06-01 ENCOUNTER — Encounter (HOSPITAL_COMMUNITY): Payer: Self-pay | Admitting: Cardiology

## 2024-06-01 ENCOUNTER — Ambulatory Visit (HOSPITAL_COMMUNITY)
Admission: RE | Admit: 2024-06-01 | Discharge: 2024-06-01 | Disposition: A | Discharge status: Home / Self Care | Source: Ambulatory Visit | Attending: Cardiology | Admitting: Cardiology

## 2024-06-01 VITALS — BP 128/86 | HR 58 | Ht 72.0 in | Wt 221.0 lb

## 2024-06-01 DIAGNOSIS — I5032 Chronic diastolic (congestive) heart failure: Secondary | ICD-10-CM | POA: Insufficient documentation

## 2024-06-01 DIAGNOSIS — I739 Peripheral vascular disease, unspecified: Secondary | ICD-10-CM | POA: Insufficient documentation

## 2024-06-01 DIAGNOSIS — I5022 Chronic systolic (congestive) heart failure: Secondary | ICD-10-CM

## 2024-06-01 DIAGNOSIS — I428 Other cardiomyopathies: Secondary | ICD-10-CM | POA: Diagnosis not present

## 2024-06-01 DIAGNOSIS — I2510 Atherosclerotic heart disease of native coronary artery without angina pectoris: Secondary | ICD-10-CM | POA: Insufficient documentation

## 2024-06-01 DIAGNOSIS — I10 Essential (primary) hypertension: Secondary | ICD-10-CM | POA: Insufficient documentation

## 2024-06-01 DIAGNOSIS — I447 Left bundle-branch block, unspecified: Secondary | ICD-10-CM

## 2024-06-01 LAB — ECHOCARDIOGRAM COMPLETE
Area-P 1/2: 4.44 cm2
S' Lateral: 5.22 cm

## 2024-06-01 MED ORDER — FUROSEMIDE 20 MG PO TABS: 20.0000 mg | ORAL_TABLET | Freq: Every day | ORAL | 3 refills | Status: AC | PRN

## 2024-06-01 MED ORDER — PERFLUTREN LIPID MICROSPHERE
1.0000 mL | INTRAVENOUS | Status: DC | PRN
  Administered 2024-06-01: 2 mL via INTRAVENOUS

## 2024-06-01 MED ORDER — FARXIGA 10 MG PO TABS: 10.0000 mg | ORAL_TABLET | Freq: Every day | ORAL | 11 refills | Status: AC

## 2024-06-01 MED ORDER — ENTRESTO 24-26 MG PO TABS: 1.0000 | ORAL_TABLET | Freq: Two times a day (BID) | ORAL | 11 refills | Status: AC

## 2024-06-01 NOTE — Patient Instructions (Addendum)
 STOP Ibersartan.  CHANGE Lasix  to as needed for weight gain of 3lb in 24 hours or 5 lb in a week.  START Entresto 24/26 mg Twice daily  START Farxiga 10 mg daily.  Your physician has requested that you have a cardiac MRI. Cardiac MRI uses a computer to create images of your heart as its beating, producing both still and moving pictures of your heart and major blood vessels. For further information please visit [email or web address]. Please follow the instruction sheet given to you today for more information.  Please follow up with our heart failure pharmacist as scheduled.  Your physician recommends that you schedule a follow-up appointment in: 3 months ( January 2026) ** PLEASE CALL THE OFFICE IN DECEMBER TO ARRANGE YOUR FOLLOW UP APPOINTMENT.**  If you have any questions or concerns before your next appointment please send us  a message through [facility] or call our office at [phone].    TO LEAVE A MESSAGE FOR THE NURSE SELECT OPTION 2, PLEASE LEAVE A MESSAGE INCLUDING: YOUR NAME DATE OF BIRTH CALL BACK NUMBER REASON FOR CALL**this is important as we prioritize the call backs  YOU WILL RECEIVE A CALL BACK THE SAME DAY AS [place] AS YOU CALL BEFORE 4:00 PM  At the Advanced [facility], you and your health needs are our priority. As part of our continuing mission to provide you with exceptional heart care, we have created designated Provider Care Teams. These Care Teams include your primary Cardiologist (physician) and Advanced Practice Providers (APPs- Physician Assistants and Nurse Practitioners) who all work together to provide you with the care you need, when you need it.   You may see any of the following providers on your designated Care Team at your next follow up: Dr Toribio Fuel Dr Ezra Shuck Dr. Ria Commander Dr. Morene Brownie Amy Lenetta, NP Caffie Shed, [place] [facility] [place], [place] Beckey Coe, NP [place] Lee, NP Ellouise Class, NP Tinnie Redman, PharmD Jaun Bash, PharmD   Please be sure to bring in all your medications bottles to every appointment.    Thank you for choosing [facility]

## 2024-06-04 ENCOUNTER — Encounter (HOSPITAL_COMMUNITY): Payer: Self-pay

## 2024-06-08 ENCOUNTER — Ambulatory Visit (HOSPITAL_COMMUNITY): Admission: RE | Admit: 2024-06-08 | Source: Ambulatory Visit

## 2024-06-10 ENCOUNTER — Encounter (HOSPITAL_COMMUNITY): Payer: Self-pay

## 2024-06-10 ENCOUNTER — Other Ambulatory Visit: Payer: Self-pay | Admitting: Family Medicine

## 2024-06-10 DIAGNOSIS — R4589 Other symptoms and signs involving emotional state: Secondary | ICD-10-CM

## 2024-06-12 ENCOUNTER — Ambulatory Visit (HOSPITAL_COMMUNITY)

## 2024-06-21 NOTE — Progress Notes (Incomplete)
***  In Progress***    Advanced Heart Failure Clinic Note   HPI:  Todd Weeks is a 68 y.o. male with a PMH of PAD, HFrEF who presents for initial visit for further evaluation and treatment of heart failure/cardiomyopathy.   Patient follows with Dr. Verlin. Prior history of CAD and PAD, underwent stenting of right superficial femoral artery, known occlusion on the left. Worsening dyspnea 2024 with echo at that time showing new reduction in LVEF to 25-30% and moderate MR. Stress test arranged that showed no ischemia. Underwent cardiac catheterization that showed NICM and was arranged to follow up in HF clinic.    Patient last seen in AHF Clinic by Dr. Zenaida on 06/01/24. Patient reported some occasional lower extremity swelling that improved with the furosemide . He had been breathing better at work and while moving around. He denied any previous cardiac issues. MD unable to identify a clear trigger or change in his symptoms, had just been slowly worsening for some time now. Patient had COVID multiple times, but no severe infections.    Today he returns to HF clinic for pharmacist medication titration. At last visit with MD, patient started Entresto 24/26 mg BID and Farxiga 10 mg daily. Furosemide  was also adjusted to PRN. ***  Shortness of breath/dyspnea on exertion? {YES NO:22349}  Orthopnea/PND? {YES NO:22349} Edema? {YES NO:22349} Lightheadedness/dizziness? {YES NO:22349} Daily weights at home? {YES NO:22349} Blood pressure/heart rate monitoring at home? {YES [id number] Following low-sodium/fluid-restricted diet? {YES NO:22349}  HF Medications: Carvedilol  3.125 mg BID *** Entresto 24/26 mg BID *** Farxiga 10 mg daily *** Fursoemide 20 mg PRN ***  Has the patient been experiencing any side effects to the medications prescribed?  {YES NO:22349}  Does the patient have any problems obtaining medications due to transportation or finances?   {YES NO:22349}  Understanding of regimen:  {excellent/good/fair/poor:19665} Understanding of indications: {excellent/good/fair/poor:19665} Potential of compliance: {excellent/good/fair/poor:19665} Patient understands to avoid NSAIDs. Patient understands to avoid decongestants.    Pertinent Lab Values: (04/24/24) Serum creatinine 0.72 mg/dL, BUN 19 mg/dL, Potassium 4.5 mmol/L, Sodium 137 mmol/L, BNP 291 pg/mL (06/2023)  Vital Signs: Weight: *** (last clinic weight: 221 lbs) Blood pressure: *** (last clinic BP: 128/86 mmHg) *** Heart rate: *** (last clinic HR: 58 bpm) ***  Assessment/Plan:  Chronic systolic heart failure: NICM based on recent cardiac catheterization, dilation suggests some chronicity. No significant risk factors.  - ***Assess volume status*** Continue furosemide  20 mg PRN - Continue carvedilol  3.125mg  BID *** - Continue Entresto 24/26mg  BID *** - *** MRA hopefully at next visit *** - Continue Farxiga 10mg  daily *** - CMR ordered - ICD risk benefit discussion with Dr. Zenaida, will wait for CMR for further risk stratification - Per Dr. Zenaida, if EF fails to recover would likely benefit from evaluation for CRT-D given left bundle appearance on EKG   CAD: Moderate RCA disease, otherwise mild CAD. - Continue aspirin , plavix , atorvastatin  80mg  daily   PAD:  - Continue aspirin /plavix    Former tobacco use:  - Stopped in 2012   LBBB:  - Will obtain CMR and titrate medical therapy. If EF still reduced --> CRT-D  Follow up ***  Morna Breach, PharmD PGY2 Cardiology Pharmacy Resident

## 2024-06-22 ENCOUNTER — Ambulatory Visit (HOSPITAL_COMMUNITY)
Admission: RE | Admit: 2024-06-22 | Discharge: 2024-06-22 | Disposition: A | Discharge status: Home / Self Care | Source: Ambulatory Visit

## 2024-06-30 ENCOUNTER — Encounter (HOSPITAL_COMMUNITY): Payer: Self-pay

## 2024-07-01 ENCOUNTER — Telehealth (HOSPITAL_COMMUNITY): Payer: Self-pay | Admitting: *Deleted

## 2024-07-01 NOTE — Telephone Encounter (Signed)
 Reaching out to patient to offer assistance regarding upcoming cardiac imaging study; pt verbalizes understanding of appt date/time, parking situation and where to check in; name and call back number provided for further questions should they arise  Chantal Requena RN Navigator Cardiac Imaging [facility] [phone] office [phone] cell  Patient aware to bring inspire remote and denies claustrophobia.

## 2024-07-03 ENCOUNTER — Ambulatory Visit (HOSPITAL_COMMUNITY)
Admission: RE | Admit: 2024-07-03 | Discharge: 2024-07-03 | Disposition: A | Discharge status: Home / Self Care | Source: Ambulatory Visit | Attending: Cardiology | Admitting: Cardiology

## 2024-07-03 ENCOUNTER — Encounter (HOSPITAL_COMMUNITY): Payer: Self-pay

## 2024-07-03 DIAGNOSIS — I5032 Chronic diastolic (congestive) heart failure: Secondary | ICD-10-CM

## 2024-07-07 ENCOUNTER — Telehealth: Payer: Self-pay | Admitting: Family Medicine

## 2024-07-07 DIAGNOSIS — I509 Heart failure, unspecified: Secondary | ICD-10-CM

## 2024-07-07 NOTE — Progress Notes (Signed)
 Pharmacy Quality Measure Review  This patient is appearing on a report for being at risk of failing the adherence measure for diabetes medications this calendar year.   Medication: Farxiga  10mg  Last fill date: 10/20 for 30 day supply  Discussed barriers to adherence, which included cost. States he is in the process of moving, and a lot is up in the air, making him uncertain whether he can afford the current copay for this medication and Entresto , newly prescribed in October for heart failure.  Both Entresto  and Elda are ~$100/month, which he states is likely unsustainable for him to pay [place]io-term. He has a few more months of Farxiga  remaining, and plans to go to his appointment in early December with pharmacy H&V clinic.  His insurance is [employer] [facility] Advantage, Part C). Discussed price negotiations for Medicare which are ongoing, those for 2026 implementation include Farxiga  and Jardiance. This hopefully will lower costs for him in future for this medication.  [email or web address]  Encouraged patient to continue discussing cost concerns with his care team, and be transparent about adherence in general. Discussed benefits of Farxiga , all patient questions were answered.  Angela Baalmann, PharmD [facility] Pharmacist

## 2024-07-08 ENCOUNTER — Telehealth (HOSPITAL_BASED_OUTPATIENT_CLINIC_OR_DEPARTMENT_OTHER): Payer: Self-pay | Admitting: Pharmacist

## 2024-07-08 NOTE — Progress Notes (Signed)
 Pharmacy Quality Measure Review  This patient is appearing on a report for being at risk of failing the adherence measure for cholesterol (statin) medications this calendar year.   Medication: atorvastatin   Last fill date: 06/08/2024 for 30 day supply  Last refill noted that patient is past due to follow up with PCP. I tried to call patient but was not able to reach him. I see from recent noted from pharmacy resident that patient in in the middle of moving.   Also noted that he was concerned about the cost of both Farxiga  and Entresto . He might qualify for assistance with the [facility] Cardiomyopathy Grant. Or he could check to see if the [employer] Heart and Diabetes plan might be a better option for him with lower medication cost.   Unable to reach patient today. Will continue to reach out regarding cost of Entresto  and Farxiga . Will see if PCP might OK enough atorvastatin  for 30 days to give patient time to move and get settle, then come in for appointment but patient has not been seen since 03/2023 by Dr. Kennyth.  Madelin Ray, PharmD Clinical Pharmacist [facility] Primary [facility]  Population Health [phone]

## 2024-07-09 ENCOUNTER — Other Ambulatory Visit: Payer: Self-pay | Admitting: Family Medicine

## 2024-07-09 DIAGNOSIS — K219 Gastro-esophageal reflux disease without esophagitis: Secondary | ICD-10-CM

## 2024-07-09 DIAGNOSIS — I2510 Atherosclerotic heart disease of native coronary artery without angina pectoris: Secondary | ICD-10-CM

## 2024-07-13 ENCOUNTER — Other Ambulatory Visit: Payer: Self-pay

## 2024-07-13 DIAGNOSIS — E782 Mixed hyperlipidemia: Secondary | ICD-10-CM

## 2024-07-13 MED ORDER — ATORVASTATIN CALCIUM 40 MG PO TABS: 40.0000 mg | ORAL_TABLET | Freq: Every morning | ORAL | 0 refills | Status: DC

## 2024-07-13 NOTE — Telephone Encounter (Signed)
 He last saw me over a year ago. We can send in a 30 day supply but he needs to schedule a visit with me ASAP for ongoing refills.

## 2024-07-13 NOTE — Telephone Encounter (Signed)
 Noted. Refill sent. Patient does have an appointment for 07/20/2024. No future med refills if appointment is canceled.

## 2024-07-20 ENCOUNTER — Ambulatory Visit: Admitting: Family Medicine

## 2024-07-20 ENCOUNTER — Encounter: Payer: Self-pay | Admitting: Family Medicine

## 2024-07-20 VITALS — BP 122/72 | HR 71 | Temp 97.7°F | Ht 72.0 in | Wt 219.0 lb

## 2024-07-20 DIAGNOSIS — Z131 Encounter for screening for diabetes mellitus: Secondary | ICD-10-CM | POA: Diagnosis not present

## 2024-07-20 DIAGNOSIS — F324 Major depressive disorder, single episode, in partial remission: Secondary | ICD-10-CM

## 2024-07-20 DIAGNOSIS — I5020 Unspecified systolic (congestive) heart failure: Secondary | ICD-10-CM | POA: Insufficient documentation

## 2024-07-20 DIAGNOSIS — R739 Hyperglycemia, unspecified: Secondary | ICD-10-CM

## 2024-07-20 DIAGNOSIS — N400 Enlarged prostate without lower urinary tract symptoms: Secondary | ICD-10-CM

## 2024-07-20 DIAGNOSIS — R4589 Other symptoms and signs involving emotional state: Secondary | ICD-10-CM | POA: Diagnosis not present

## 2024-07-20 DIAGNOSIS — E782 Mixed hyperlipidemia: Secondary | ICD-10-CM

## 2024-07-20 DIAGNOSIS — Z0001 Encounter for general adult medical examination with abnormal findings: Secondary | ICD-10-CM

## 2024-07-20 DIAGNOSIS — I10 Essential (primary) hypertension: Secondary | ICD-10-CM | POA: Diagnosis not present

## 2024-07-20 DIAGNOSIS — K219 Gastro-esophageal reflux disease without esophagitis: Secondary | ICD-10-CM | POA: Diagnosis not present

## 2024-07-20 DIAGNOSIS — Z23 Encounter for immunization: Secondary | ICD-10-CM | POA: Diagnosis not present

## 2024-07-20 LAB — COMPREHENSIVE METABOLIC PANEL WITH GFR
ALT: 19 U/L (ref 0–53)
AST: 18 U/L (ref 0–37)
Albumin: 4.6 g/dL (ref 3.5–5.2)
Alkaline Phosphatase: 70 U/L (ref 39–117)
BUN: 19 mg/dL (ref 6–23)
CO2: 25 meq/L (ref 19–32)
Calcium: 9.3 mg/dL (ref 8.4–10.5)
Chloride: 104 meq/L (ref 96–112)
Creatinine, Ser: 0.53 mg/dL (ref 0.40–1.50)
GFR: 102.02 mL/min (ref >60.00)
Glucose, Bld: 89 mg/dL (ref 70–99)
Potassium: 4.3 meq/L (ref 3.5–5.1)
Sodium: 139 meq/L (ref 135–145)
Total Bilirubin: 0.8 mg/dL (ref 0.2–1.2)
Total Protein: 6.8 g/dL (ref 6.0–8.3)

## 2024-07-20 LAB — LIPID PANEL
Cholesterol: 160 mg/dL (ref 0–200)
HDL: 60.2 mg/dL (ref >39.00)
LDL Cholesterol: 79 mg/dL (ref 0–99)
NonHDL: 99.91
Total CHOL/HDL Ratio: 3
Triglycerides: 106 mg/dL (ref 0.0–149.0)
VLDL: 21.2 mg/dL (ref 0.0–40.0)

## 2024-07-20 LAB — CBC
HCT: 43.1 % (ref 39.0–52.0)
Hemoglobin: 14.6 g/dL (ref 13.0–17.0)
MCHC: 33.9 g/dL (ref 30.0–36.0)
MCV: 92.8 fl (ref 78.0–100.0)
Platelets: 229 K/uL (ref 150.0–400.0)
RBC: 4.65 Mil/uL (ref 4.22–5.81)
RDW: 13 % (ref 11.5–15.5)
WBC: 6.4 K/uL (ref 4.0–10.5)

## 2024-07-20 LAB — TSH: TSH: 1.91 u[IU]/mL (ref 0.35–5.50)

## 2024-07-20 LAB — PSA: PSA: 95.07 ng/mL — ABNORMAL HIGH (ref 0.10–4.00)

## 2024-07-20 LAB — HEMOGLOBIN A1C: Hgb A1c MFr Bld: 4.8 % (ref 4.6–6.5)

## 2024-07-20 MED ORDER — FLUOXETINE HCL 40 MG PO CAPS: 40.0000 mg | ORAL_CAPSULE | Freq: Every morning | ORAL | 3 refills | Status: AC

## 2024-07-20 MED ORDER — ATORVASTATIN CALCIUM 40 MG PO TABS: 40.0000 mg | ORAL_TABLET | Freq: Every morning | ORAL | 3 refills | Status: AC

## 2024-07-20 MED ORDER — TAMSULOSIN HCL 0.4 MG PO CAPS: 0.4000 mg | ORAL_CAPSULE | Freq: Every day | ORAL | 3 refills | Status: AC

## 2024-07-20 MED ORDER — FAMOTIDINE 20 MG PO TABS: 20.0000 mg | ORAL_TABLET | Freq: Every day | ORAL | 3 refills | Status: AC

## 2024-07-20 NOTE — Progress Notes (Signed)
 =  Chief Complaint:  Todd Weeks is a 68 y.o. male who presents today for his annual comprehensive physical exam.    Assessment/Plan:  Chronic Problems Addressed Today: HFrEF (heart failure with reduced ejection fraction) (HCC) Patient diagnosed with HFrEF since our last visit.  Most recent echocardiogram showed EF of 30 to 35%.  He has established with advanced heart failure clinic and was recently started on Jardiance and Entresto .  He has been tolerating these well however he is concerned about additional cost as these will be $100 per month of mine.  He has an appointment later this week to discuss with the cardiologist about making this more affordable.  Overall symptoms are stable and euvolemic today.  Has cardiac MRI later this week.  HTN (hypertension) At goal today on Coreg  3.125 mg twice daily and Entresto  24-26 twice daily per cardiology.  BPH (benign prostatic hyperplasia) On Flomax  0.4 mg daily.  Tolerating well.  Check PSA.  He has not seen urology for the last year or 2.  Hyperlipidemia Check lipids.  He is on Lipitor 40 mg daily.  Depression, major, single episode, in partial remission Patient does admit to being under more stress and anxiety recently due to his health issues.  Overall symptoms are manageable on Prozac  40 mg daily.  Does not wish to change the dose or any medications today.  We did discuss referral for counseling however he declined.  Hyperglycemia Check A1c with labs.  Preventative Healthcare: Check labs.  He will be getting colonoscopy soon once he gets cardiac clearance.  Flu shot given today.  Patient Counseling(The following topics were reviewed and/or handout was given):  -Nutrition: Stressed importance of moderation in sodium/caffeine intake, saturated fat and cholesterol, caloric balance, sufficient intake of fresh fruits, vegetables, and fiber.  -Stressed the importance of regular exercise.   -Substance Abuse: Discussed cessation/primary  prevention of tobacco, alcohol, or other drug use; driving or other dangerous activities under the influence; availability of treatment for abuse.   -Injury prevention: Discussed safety belts, safety helmets, smoke detector, smoking near bedding or upholstery.   -Sexuality: Discussed sexually transmitted diseases, partner selection, use of condoms, avoidance of unintended pregnancy and contraceptive alternatives.   -Dental health: Discussed importance of regular tooth brushing, flossing, and dental visits.  -Health maintenance and immunizations reviewed. Please refer to Health maintenance section.  Return to care in 1 year for next preventative visit.     Subjective:  HPI:  He has no acute complaints today. Patient is here today for his  annual physical.  See assessment / plan for status of chronic conditions.  Discussed the use of AI scribe software for clinical note transcription with the patient, who gave verbal consent to proceed.  History of Present Illness Todd Weeks is a 68 year old male with heart failure who presents for medication refills and follow-up.  He was recently transitioned from irbesartan  to Farxiga  and Entresto  at the heart failure clinic about a month ago. He is uncertain about the purpose of these medications and finds them unaffordable due to high insurance costs. He is awaiting an MRI for further evaluation of his heart condition. Previous work-up includes an echocardiogram a couple of months ago and a catheterization earlier this year.  He is currently taking Prozac , Pepcid , and Flomax . He is unsure which medications need refilling but mentions that most of his medications are covered by insurance, except for the new heart medications which are costly. He has an appointment with the  clinic's pharmacist to discuss medication assistance options.  He has not seen a urologist recently but is due for a PSA check as it has been a couple of years since the last  one.  Socially, he is experiencing stress related to selling his house and financial concerns. He is working a lot to make ends meet and is in the process of selling his house, which has been challenging. The Prozac  is helping him manage his stress.        07/20/2024    8:19 AM  Depression screen PHQ 2/9  Decreased Interest 0  Down, Depressed, Hopeless 1  PHQ - 2 Score 1  Altered sleeping 1  Tired, decreased energy 0  Change in appetite 0  Feeling bad or failure about yourself  0  Trouble concentrating 0  Moving slowly or fidgety/restless 0  Suicidal thoughts 0  PHQ-9 Score 2  Difficult doing work/chores Not difficult at all    Health Maintenance Due  Topic Date Due   Lung Cancer Screening  10/09/2022   Medicare Annual Wellness (AWV)  09/18/2023   Colonoscopy  10/07/2023     ROS: Per HPI, otherwise a complete review of systems was negative.   PMH:  The following were reviewed and entered/updated in epic: Past Medical History:  Diagnosis Date   Bell's palsy    Cataract    Mixed form OU   Closed nondisplaced fracture of neck of third metacarpal bone of right hand 02/04/2017   Clotting disorder    COPD (chronic obstructive pulmonary disease) (HCC)    Depression    Diverticulitis    GERD (gastroesophageal reflux disease)    Headache(784.0)    HTN (hypertension)    Hyperlipidemia    Hypertensive retinopathy    OU   Mild CAD 2012   PVD (peripheral vascular disease)    Retinal detachment    Sexual dysfunction    Sleep apnea    Patient Active Problem List   Diagnosis Date Noted   HFrEF (heart failure with reduced ejection fraction) (HCC) 07/20/2024   OSA (obstructive sleep apnea) 11/01/2022   Polyarthralgia 04/23/2022   Non-recurrent unilateral inguinal hernia without obstruction or gangrene 04/24/2021   Retinal detachment, rhegmatogenous, right eye 05/24/2020   Hyperglycemia 02/18/2020   BPH (benign prostatic hyperplasia) 08/21/2019   Depression, major,  single episode, in partial remission 08/04/2018   Anxiety 06/12/2018   DOE (dyspnea on exertion) 05/01/2018   GERD (gastroesophageal reflux disease) 05/01/2018   Insulin resistance 09/03/2017   Lesion or mass of paranasal sinuses 01/18/2016   Hyperlipidemia 05/04/2015   Atherosclerosis of native arteries of extremity with intermittent claudication 05/22/2012   CAD (coronary artery disease) 02/02/2011   Elevated PSA 08/06/2007   Former smoker 08/01/2007   HTN (hypertension) 08/01/2007   Past Surgical History:  Procedure Laterality Date   ABDOMINAL AORTOGRAM W/LOWER EXTREMITY Bilateral 02/05/2020   Procedure: ABDOMINAL AORTOGRAM W/LOWER EXTREMITY;  Surgeon: Harvey Carlin BRAVO, MD;  [facility]: [facility];  Service: Cardiovascular;  Laterality: Bilateral;   ABDOMINAL AORTOGRAM W/LOWER EXTREMITY N/A 12/16/2020   Procedure: ABDOMINAL AORTOGRAM W/LOWER EXTREMITY;  Surgeon: Harvey Carlin BRAVO, MD;  Location: [facility];  Service: Cardiovascular;  Laterality: N/A;   CATARACT EXTRACTION     DRUG INDUCED ENDOSCOPY Right 01/15/2023   Procedure: DRUG INDUCED SLEEP  ENDOSCOPY;  Surgeon: Carlie Clark, MD;  Location: [facility];  Service: ENT;  Laterality: Right;   EYE SURGERY Right 05/06/2020   Pneumatic retinopexy for rheg. RD  repair - Dr. Redell Hans   EYE SURGERY Right 05/26/2020   PPV/MP - Dr. Redell Hans   GAS INSERTION Right 05/26/2020   Procedure: INSERTION OF GAS;  Surgeon: Hans Redell, MD;  Location: [facility] OR;  Service: Ophthalmology;  Laterality: Right;   IMPLANTATION OF HYPOGLOSSAL NERVE STIMULATOR Right 05/13/2023   Procedure: IMPLANTATION OF HYPOGLOSSAL NERVE STIMULATOR;  Surgeon: Carlie Clark, MD;  Location: [facility];  Service: ENT;  Laterality: Right;   INGUINAL HERNIA REPAIR Right 05/29/2021   Procedure: RIGHT INGUINAL HERNIA REPAIR WITH MESH;  Surgeon: Curvin Deward MOULD, MD;  Location: [facility];  Service: General;   Laterality: Right;  TAP BLOCK   LEFT HEART CATH AND CORONARY ANGIOGRAPHY N/A 04/05/2021   Procedure: LEFT HEART CATH AND CORONARY ANGIOGRAPHY;  Surgeon: Verlin Lonni BIRCH, MD;  Location: [facility];  Service: Cardiovascular;  Laterality: N/A;   MEMBRANE PEEL Right 05/26/2020   Procedure: MEMBRANE PEEL;  Surgeon: Hans Redell, MD;  Location: [facility] OR;  Service: Ophthalmology;  Laterality: Right;   PARS PLANA VITRECTOMY Right 05/26/2020   Procedure: PARS PLANA VITRECTOMY WITH 25 GAUGE;  Surgeon: Hans Redell, MD;  Location: [facility] OR;  Service: Ophthalmology;  Laterality: Right;   PERIPHERAL VASCULAR INTERVENTION Right 12/16/2020   Procedure: PERIPHERAL VASCULAR INTERVENTION;  Surgeon: Harvey Carlin BRAVO, MD;  Location: [facility];  Service: Cardiovascular;  Laterality: Right;  SFA (distal)   PHOTOCOAGULATION Right 05/26/2020   Procedure: PHOTOCOAGULATION;  Surgeon: Hans Redell, MD;  Location: [facility] OR;  Service: Ophthalmology;  Laterality: Right;   PROSTATE BIOPSY  2015   RETINAL DETACHMENT SURGERY Right 05/06/2020   Pneumatic retinopexy for rheg RD repair - Dr. Redell Hans   RETINAL DETACHMENT SURGERY Right 05/26/2020   PPV/MP - Dr. Redell Hans   RIGHT/LEFT HEART CATH AND CORONARY ANGIOGRAPHY N/A 05/06/2024   Procedure: RIGHT/LEFT HEART CATH AND CORONARY ANGIOGRAPHY;  Surgeon: Verlin Lonni BIRCH, MD;  Location: [facility];  Service: Cardiovascular;  Laterality: N/A;    Family History  Problem Relation Age of Onset   Hypertension Mother    Deep vein thrombosis Father    Diabetes Brother    Tuberculosis Maternal Grandfather    Emphysema Maternal Grandfather    Lung disease Brother    Asthma Son    Rectal cancer Neg Hx    Colon cancer Neg Hx     Medications- reviewed and updated Current Outpatient Medications  Medication Sig Dispense Refill   albuterol  (VENTOLIN  HFA) 108 (90 Base) MCG/ACT inhaler Inhale 2 puffs into the lungs every 6 (six) hours as  needed for wheezing or shortness of breath. 8 g 6   ALPRAZolam  (XANAX ) 0.25 MG tablet TAKE TWO TABLETS BY MOUTH TWICE DAILY AS NEEDED FOR ANXIETY 90 tablet 1   aspirin  EC 81 MG tablet Take 1 tablet (81 mg total) by mouth daily. 150 tablet 2   carvedilol  (COREG ) 3.125 MG tablet Take 1 tablet (3.125 mg total) by mouth 2 (two) times daily with a meal. 60 tablet 3   clopidogrel  (PLAVIX ) 75 MG tablet Take 1 tablet (75 mg total) by mouth daily. 90 tablet 3   Coenzyme Q10 (CO Q 10) 100 MG CAPS Take 100 mg by mouth daily.     diclofenac  Sodium (VOLTAREN ) 1 % GEL Apply 1 application topically 3 (three) times daily as needed (leg cramps/spasms).     ENTRESTO  24-26 MG Take 1 tablet by mouth 2 (two) times daily. 60 tablet 11  FARXIGA  10 MG TABS tablet Take 1 tablet (10 mg total) by mouth daily before breakfast. 30 tablet 11   Multiple Vitamins-Minerals (MULTIVITAMIN WITH MINERALS) tablet Take 1 tablet by mouth daily.     nitroGLYCERIN  (NITROSTAT ) 0.4 MG SL tablet Place 1 tablet (0.4 mg total) under the tongue every 5 (five) minutes as needed for chest pain. 25 tablet 6   pantoprazole  (PROTONIX ) 40 MG tablet Take 1 tablet (40 mg total) by mouth daily. Take 30-60 min before first meal of the day (Patient taking differently: Take 40 mg by mouth daily as needed (heartburn). Take 30-60 min before first meal of the day) 30 tablet 2   Tiotropium Bromide Monohydrate  (SPIRIVA  RESPIMAT) 2.5 MCG/ACT AERS Inhale 2 puffs into the lungs daily. (Patient taking differently: Inhale 2 puffs into the lungs daily as needed (Shortness of breath/ wheezing).) 1 g 5   atorvastatin  (LIPITOR) 40 MG tablet Take 1 tablet (40 mg total) by mouth in the morning. 90 tablet 3   famotidine  (PEPCID ) 20 MG tablet Take 1 tablet (20 mg total) by mouth daily. 90 tablet 3   FLUoxetine  (PROZAC ) 40 MG capsule Take 1 capsule (40 mg total) by mouth in the morning. 90 capsule 3   furosemide  (LASIX ) 20 MG tablet Take 1 tablet (20 mg total) by mouth daily  as needed for fluid or edema. FOR WEIGHT GAIN OF 3LB IN 24 HOUR OR 5LB IN A WEEK 90 tablet 3   tamsulosin  (FLOMAX ) 0.4 MG CAPS capsule Take 1 capsule (0.4 mg total) by mouth daily. 90 capsule 3   No current facility-administered medications for this visit.    Allergies-reviewed and updated No Known Allergies  Social History   Socioeconomic History   Marital status: Single    Spouse name: Not on file   Number of children: 2   Years of education: 12   Highest education level: 11th grade  Occupational History   Occupation: Systems Analyst   Occupation: Unemployed    Comment: disability  Tobacco Use   Smoking status: Former    Current packs/day: 0.00    Average packs/day: 1.5 packs/day for 46.0 years (69.0 ttl pk-yrs)    Types: Cigarettes    Start date: 12/11/1964    Quit date: 12/12/2010    Years since quitting: 13.6   Smokeless tobacco: Never  Vaping Use   Vaping status: Never Used  Substance and Sexual Activity   Alcohol use: Yes    Alcohol/week: 4.0 standard drinks of alcohol    Types: 4 Cans of beer per week    Comment: a couple of beer on a weekend   Drug use: Not Currently    Types: Cocaine, Marijuana, Crack cocaine    Comment: over 25 years ago   Sexual activity: Not on file  Other Topics Concern   Not on file  Social History Narrative   Raised in [place], [place]. Does not have any religious beliefs that would effect healthcare. Lives in house with sister. Likes to ride motorcycle for fun.    Moving out on his own; in the next couple of weeks will be moving back home that he had rented      Motorcycles; with a helmet   Social Drivers of Health   Financial Resource Strain: Medium Risk (10/30/2023)   Overall Financial Resource Strain (CARDIA)    Difficulty of Paying Living Expenses: Somewhat hard  Food Insecurity: No Food Insecurity (10/30/2023)   Hunger Vital Sign    Worried About Running Out of  Food in the Last Year: Never true    Ran Out of Food in the Last  Year: Never true  Transportation Needs: No Transportation Needs (10/30/2023)   PRAPARE - Administrator, Civil Service (Medical): No    Lack of Transportation (Non-Medical): No  Physical Activity: Insufficiently Active (10/31/2023)   Exercise Vital Sign    Days of Exercise per Week: 3 days    Minutes of Exercise per Session: 30 min  Stress: Stress Concern Present (11/11/2023)   [employer] of Occupational Health - Occupational Stress Questionnaire    Feeling of Stress : Very much  Social Connections: Socially Isolated (10/30/2023)   Social Connection and Isolation Panel    Frequency of Communication with Friends and Family: Never    Frequency of Social Gatherings with Friends and Family: Never    Attends Religious Services: Never    Database Administrator or Organizations: No    Attends Engineer, Structural: Not on file    Marital Status: Never married        Objective:  Physical Exam: BP 122/72   Pulse 71   Temp 97.7 F (36.5 C) (Temporal)   Ht 6' (1.829 m)   Wt 219 lb (99.3 kg)   SpO2 95%   BMI 29.70 kg/m   Body mass index is 29.7 kg/m. Wt Readings from Last 3 Encounters:  07/20/24 219 lb (99.3 kg)  06/01/24 221 lb (100.2 kg)  05/06/24 220 lb (99.8 kg)   Gen: NAD, resting comfortably HEENT: TMs normal bilaterally. OP clear. No thyromegaly noted.  CV: RRR with no murmurs appreciated Pulm: NWOB, CTAB with no crackles, wheezes, or rhonchi GI: Normal bowel sounds present. Soft, Nontender, Nondistended. MSK: no edema, cyanosis, or clubbing noted Skin: warm, dry Neuro: CN2-12 grossly intact. Strength 5/5 in upper and lower extremities. Reflexes symmetric and intact bilaterally.  Psych: Normal affect and thought content     Tanav Orsak M. Kennyth, MD 07/20/2024 8:47 AM

## 2024-07-20 NOTE — Assessment & Plan Note (Addendum)
 Patient diagnosed with HFrEF since our last visit.  Most recent echocardiogram showed EF of 30 to 35%.  He has established with advanced heart failure clinic and was recently started on Jardiance and Entresto .  He has been tolerating these well however he is concerned about additional cost as these will be $100 per month of mine.  He has an appointment later this week to discuss with the cardiologist about making this more affordable.  Overall symptoms are stable and euvolemic today.  Has cardiac MRI later this week.

## 2024-07-20 NOTE — Assessment & Plan Note (Signed)
Check A1c with labs. 

## 2024-07-20 NOTE — Assessment & Plan Note (Signed)
 On Flomax  0.4 mg daily.  Tolerating well.  Check PSA.  He has not seen urology for the last year or 2.

## 2024-07-20 NOTE — Assessment & Plan Note (Signed)
Check lipids.  He is on Lipitor 40 mg daily. 

## 2024-07-20 NOTE — Patient Instructions (Signed)
 It was very nice to see you today!  VISIT SUMMARY: You had a follow-up visit to discuss your heart failure management, medication refills, and recent flu vaccination. Blood work was ordered, and your medications were refilled as needed.  YOUR PLAN: HEART FAILURE: Your heart failure management is complicated by recent medication changes, with Farxiga  and Entresto  being cost-prohibitive. -Proceed with the scheduled MRI to assess your heart structure. -Discuss medication affordability with the pharmacist. -Evaluate MRI results with your cardiologist to discuss potential ICD placement.  ESSENTIAL HYPERTENSION: Your hypertension is managed with irbesartan . -Continue taking irbesartan  300 mg daily.  BENIGN PROSTATIC HYPERPLASIA: Your condition is managed with Flomax , and you are due for a PSA test. -Continue taking Flomax . -Get the PSA test as ordered.  MIXED HYPERLIPIDEMIA: Your mixed hyperlipidemia is managed with Lipitor. -Continue taking Lipitor. -Complete the blood work to assess your current lipid levels.  DEPRESSION: Your depression is managed with Prozac , and the current dose is adequate for symptom control. -Continue taking Prozac . -Consider counseling services if needed.  GASTROESOPHAGEAL REFLUX DISEASE: Your GERD is managed with Pepcid . -Continue taking Pepcid . -A prescription refill was offered.  GENERAL HEALTH MAINTENANCE: Routine wellness visit and recent flu vaccination. -Blood work was ordered, including cholesterol, A1c, and PSA tests. -Medications were refilled as needed.  Return in about 1 year (around 07/20/2025).   Take care, Dr Kennyth  PLEASE NOTE:  If you had any lab tests, please let us  know if you have not heard back within a few days. You may see your results on mychart before we have a chance to review them but we will give you a call once they are reviewed by us .   If we ordered any referrals today, please let us  know if you have not heard from their  office within the next week.   If you had any urgent prescriptions sent in today, please check with the pharmacy within an hour of our visit to make sure the prescription was transmitted appropriately.   Please try these tips to maintain a healthy lifestyle:  Eat at least 3 REAL meals and 1-2 snacks per day.  Aim for no more than 5 hours between eating.  If you eat breakfast, please do so within one hour of getting up.   Each meal should contain half fruits/vegetables, one quarter protein, and one quarter carbs (no bigger than a computer mouse)  Cut down on sweet beverages. This includes juice, soda, and sweet tea.   Drink at least 1 glass of water  with each meal and aim for at least 8 glasses per day  Exercise at least 150 minutes every week.    Preventive Care 58 Years and Older, Male Preventive care refers to lifestyle choices and visits with your health care provider that can promote health and wellness. Preventive care visits are also called wellness exams. What can I expect for my preventive care visit? Counseling During your preventive care visit, your health care provider may ask about your: Medical history, including: Past medical problems. Family medical history. History of falls. Current health, including: Emotional well-being. Home life and relationship well-being. Sexual activity. Memory and ability to understand (cognition). Lifestyle, including: Alcohol, nicotine or tobacco, and drug use. Access to firearms. Diet, exercise, and sleep habits. Work and work astronomer. Sunscreen use. Safety issues such as seatbelt and bike helmet use. Physical exam Your health care provider will check your: Height and weight. These may be used to calculate your BMI (body mass index). BMI is a  measurement that tells if you are at a healthy weight. Waist circumference. This measures the distance around your waistline. This measurement also tells if you are at a healthy weight and  may help predict your risk of certain diseases, such as type 2 diabetes and high blood pressure. Heart rate and blood pressure. Body temperature. Skin for abnormal spots. What immunizations do I need?  Vaccines are usually given at various ages, according to a schedule. Your health care provider will recommend vaccines for you based on your age, medical history, and lifestyle or other factors, such as travel or where you work. What tests do I need? Screening Your health care provider may recommend screening tests for certain conditions. This may include: Lipid and cholesterol levels. Diabetes screening. This is done by checking your blood sugar (glucose) after you have not eaten for a while (fasting). Hepatitis C test. Hepatitis B test. HIV (human immunodeficiency virus) test. STI (sexually transmitted infection) testing, if you are at risk. Lung cancer screening. Colorectal cancer screening. Prostate cancer screening. Abdominal aortic aneurysm (AAA) screening. You may need this if you are a current or former smoker. Talk with your health care provider about your test results, treatment options, and if necessary, the need for more tests. Follow these instructions at home: Eating and drinking  Eat a diet that includes fresh fruits and vegetables, whole grains, lean protein, and low-fat dairy products. Limit your intake of foods with high amounts of sugar, saturated fats, and salt. Take vitamin and mineral supplements as recommended by your health care provider. Do not drink alcohol if your health care provider tells you not to drink. If you drink alcohol: Limit how much you have to 0-2 drinks a day. Know how much alcohol is in your drink. In the [place] one drink equals one 12 oz bottle of beer (355 mL), one 5 oz glass of wine (148 mL), or one 1 oz glass of hard liquor (44 mL). Lifestyle Brush your teeth every morning and night with fluoride toothpaste. Floss one time each day. Exercise  for at least 30 minutes 5 or more days each week. Do not use any products that contain nicotine or tobacco. These products include cigarettes, chewing tobacco, and vaping devices, such as e-cigarettes. If you need help quitting, ask your health care provider. Do not use drugs. If you are sexually active, practice safe sex. Use a condom or other form of protection to prevent STIs. Take aspirin  only as told by your health care provider. Make sure that you understand how much to take and what form to take. Work with your health care provider to find out whether it is safe and beneficial for you to take aspirin  daily. Ask your health care provider if you need to take a cholesterol-lowering medicine (statin). Find healthy ways to manage stress, such as: Meditation, yoga, or listening to music. Journaling. Talking to a trusted person. Spending time with friends and family. Safety Always wear your seat belt while driving or riding in a vehicle. Do not drive: If you have been drinking alcohol. Do not ride with someone who has been drinking. When you are tired or distracted. While texting. If you have been using any mind-altering substances or drugs. Wear a helmet and other protective equipment during sports activities. If you have firearms in your house, make sure you follow all gun safety procedures. Minimize exposure to UV radiation to reduce your risk of skin cancer. What's next? Visit your health care provider once a year  for an annual wellness visit. Ask your health care provider how often you should have your eyes and teeth checked. Stay up to date on all vaccines. This information is not intended to replace advice given to you by your health care provider. Make sure you discuss any questions you have with your health care provider. Document Revised: 01/25/2021 Document Reviewed: 01/25/2021 Elsevier Patient Education  2024 [employer].

## 2024-07-20 NOTE — Assessment & Plan Note (Signed)
 At goal today on Coreg  3.125 mg twice daily and Entresto  24-26 twice daily per cardiology.

## 2024-07-20 NOTE — Assessment & Plan Note (Signed)
 Patient does admit to being under more stress and anxiety recently due to his health issues.  Overall symptoms are manageable on Prozac  40 mg daily.  Does not wish to change the dose or any medications today.  We did discuss referral for counseling however he declined.

## 2024-07-21 ENCOUNTER — Encounter (HOSPITAL_COMMUNITY): Payer: Self-pay

## 2024-07-21 ENCOUNTER — Ambulatory Visit: Payer: Self-pay | Admitting: Family Medicine

## 2024-07-21 NOTE — Progress Notes (Signed)
 His PSA is still trending up.  It looks like he last saw his urologist a couple of years ago and his MRI then did not show any prostate cancer however it would be a good idea for him to get back and see them soon.  All of his other labs are at goal and we can recheck everything else in a year.

## 2024-07-22 ENCOUNTER — Ambulatory Visit (HOSPITAL_COMMUNITY): Admission: RE | Admit: 2024-07-22 | Discharge: 2024-07-22 | Attending: Cardiology

## 2024-07-22 ENCOUNTER — Other Ambulatory Visit (HOSPITAL_COMMUNITY): Payer: Self-pay | Admitting: Cardiology

## 2024-07-22 DIAGNOSIS — I5032 Chronic diastolic (congestive) heart failure: Secondary | ICD-10-CM | POA: Diagnosis not present

## 2024-07-22 MED ORDER — GADOBUTROL 1 MMOL/ML IV SOLN
13.0000 mL | Freq: Once | INTRAVENOUS | Status: AC | PRN
  Administered 2024-07-22: 13 mL via INTRAVENOUS

## 2024-07-28 ENCOUNTER — Other Ambulatory Visit (HOSPITAL_COMMUNITY): Payer: Self-pay

## 2024-07-28 NOTE — Progress Notes (Incomplete)
***  In Progress***    Advanced Heart Failure Clinic Note   Primary Care: Kennyth Worth HERO, MD Primary Cardiologist: Dr. Verlin AHF Cardiologist: Dr. Zenaida  HPI:  Todd Weeks is a 68 y.o. male with a PMH of PAD, HFrEF who presents for further evaluation and treatment of heart failure/cardiomyopathy. Patient follows with Dr. Verlin. Prior history of CAD and PAD, underwent stenting of right superficial femoral artery, known occlusion on the left. Worsening dyspnea 2024 with echo at that time showing new reduction in LVEF to 25-30% and moderate MR. Stress test arranged that showed no ischemia. Underwent cardiac catheterization that showed NICM and was arranged to follow up in HF clinic.    ECHO: 08/06/2023: LVEF 25 to 30%, severely dilated LV, mildly reduced RV function, moderate MR 05/2024: LVEF 30 to 35%, moderately dilated LV, normal RV, moderate MR   CATH: 05/06/2024: Moderate RCA disease, otherwise no significant obstructive disease.  CMRI: 07/22/2024: Moderately dilated LV with global hypokinesis and septal-laateral dyssynchrony suggestive of LBBB. LVEF 23%. RVEF 33%. No myocardial LGE, so no definitive evidence for prior MI, infiltrative disease, or myocarditis. No extracellular volume percentage.   Today he returns to HF clinic for pharmacist medication titration. At last visit with MD on 10/20, he reported some occasional LE edema that had improved with lasix  and breathing better while at work. Irbesartan  was transitioned to Entresto , Farxiga  was started, and lasix  was changed to as needed.   Shortness of breath/dyspnea on exertion? {YES NO:22349}  Orthopnea/PND? {YES NO:22349} Edema? {YES NO:22349} Lightheadedness/dizziness? {YES NO:22349} Daily weights at home? {YES NO:22349} Blood pressure/heart rate monitoring at home? {YES [id number] Following low-sodium/fluid-restricted diet? {YES NO:22349}  HF Medications: Carvedilol  3.125 mg BID Entresto  24/26 mg BID Farxiga  10 mg  daily Furosemide  20 mg daily PRN  Has the patient been experiencing any side effects to the medications prescribed?  {YES NO:22349}  Does the patient have any problems obtaining medications due to transportation or finances?   Yes - copays for Entresto  and Farxiga  unaffordable ***  Understanding of regimen: {excellent/good/fair/poor:19665} Understanding of indications: {excellent/good/fair/poor:19665} Potential of compliance: {excellent/good/fair/poor:19665} Patient understands to avoid NSAIDs. Patient understands to avoid decongestants.    Pertinent Lab Values: 12/8: Serum creatinine 0.53, BUN 19, Potassium 4.3, Sodium 139  Vital Signs: Weight: *** (last clinic weight: 221 lbs) Blood pressure: ***  Heart rate: ***   Assessment/Plan: 1. Chronic systolic heart failure: NICM based on recent cardiac catheterization, dilation suggests some chronicity. cMRI suggestive of LBBB cardiomyopathy.  - Continue carvedilol  3.125 mg BID - Continue Entresto  24/26 mg BID - Continue Farxiga  10 mg daily - Continue furosemide  20 mg daily PRN - *** spiro - If EF fails to recover would likely benefit from evaluation for CRT-D given left bundle appearance on EKG  2. CAD: Moderate RCA disease, otherwise mild CAD. - Continue aspirin , plavix , atorvastatin  80mg  daily  3. PAD - Continue aspirin /plavix    4. LBBB - Will obtain CMR and titrate medical therapy. If EF still reduced --> CRT-D   Follow up ***   Duwaine Plant, PharmD, BCPS Heart Failure Clinic Pharmacist [phone]

## 2024-07-29 ENCOUNTER — Ambulatory Visit (HOSPITAL_COMMUNITY): Admission: RE | Admit: 2024-07-29 | Source: Ambulatory Visit

## 2024-07-31 ENCOUNTER — Ambulatory Visit: Admitting: Family

## 2024-08-07 ENCOUNTER — Encounter: Payer: Self-pay | Admitting: Family Medicine

## 2024-08-11 ENCOUNTER — Other Ambulatory Visit: Payer: Self-pay | Admitting: Pulmonary Disease

## 2024-08-11 DIAGNOSIS — G4733 Obstructive sleep apnea (adult) (pediatric): Secondary | ICD-10-CM

## 2024-08-31 ENCOUNTER — Other Ambulatory Visit: Payer: Self-pay | Admitting: Family Medicine

## 2024-08-31 DIAGNOSIS — I2510 Atherosclerotic heart disease of native coronary artery without angina pectoris: Secondary | ICD-10-CM

## 2024-09-08 ENCOUNTER — Ambulatory Visit (HOSPITAL_COMMUNITY): Payer: Self-pay | Admitting: Cardiology

## 2024-09-14 ENCOUNTER — Ambulatory Visit (HOSPITAL_COMMUNITY): Admitting: Cardiology

## 2024-09-17 ENCOUNTER — Encounter (HOSPITAL_COMMUNITY): Payer: Self-pay | Admitting: Emergency Medicine

## 2024-09-17 ENCOUNTER — Emergency Department (HOSPITAL_COMMUNITY)
Admission: EM | Admit: 2024-09-17 | Discharge: 2024-09-17 | Disposition: A | Discharge status: Home / Self Care | Source: Home / Self Care | Attending: Emergency Medicine | Admitting: Emergency Medicine

## 2024-09-17 ENCOUNTER — Other Ambulatory Visit: Payer: Self-pay

## 2024-09-17 ENCOUNTER — Emergency Department (HOSPITAL_COMMUNITY)

## 2024-09-17 DIAGNOSIS — S2231XA Fracture of one rib, right side, initial encounter for closed fracture: Secondary | ICD-10-CM

## 2024-09-17 MED ORDER — HYDROCODONE-ACETAMINOPHEN 5-325 MG PO TABS: 1.0000 | ORAL_TABLET | Freq: Four times a day (QID) | ORAL | 0 refills | Status: AC | PRN

## 2024-09-17 MED ORDER — HYDROCODONE-ACETAMINOPHEN 5-325 MG PO TABS
1.0000 | ORAL_TABLET | Freq: Once | ORAL | Status: AC
  Administered 2024-09-17: 1 via ORAL
  Filled 2024-09-17: qty 1

## 2024-09-17 MED ORDER — KETOROLAC TROMETHAMINE 15 MG/ML IJ SOLN
15.0000 mg | Freq: Once | INTRAMUSCULAR | Status: AC
  Administered 2024-09-17: 15 mg via INTRAMUSCULAR
  Filled 2024-09-17: qty 1

## 2024-09-17 MED ORDER — LIDOCAINE 5 % EX PTCH
1.0000 | MEDICATED_PATCH | CUTANEOUS | Status: DC
  Administered 2024-09-17: 1 via TRANSDERMAL
  Filled 2024-09-17: qty 1

## 2024-09-17 MED ORDER — LIDOCAINE 5 % EX PTCH: 1.0000 | MEDICATED_PATCH | CUTANEOUS | 0 refills | Status: AC

## 2024-09-17 NOTE — ED Notes (Signed)
 Pt reports that he fell a week ago and and has had a lot of pain with any inspiration or movement. Pt LSCA. Pt given incentive spirometer with education and rationale on use. Return demonstration noted. Pt was able to reach 2000 with minimal difficulty.

## 2024-09-17 NOTE — ED Provider Notes (Cosign Needed Addendum)
 " [facility] EMERGENCY DEPARTMENT AT [facility] Provider Note   CSN: [id number] Arrival date & time: 09/17/24  1143     Patient presents with: Todd Weeks Todd Weeks is a 69 y.o. male.  To ER for right sided chest wall pain after falling on his right side a week and a half ago, he states he was going into the shed and slipped on ice and fell on his right side, has been having severe pain since then it is worse when he takes a deep breath or when he moves.  He states he had a more mild fall earlier that day where he fell on his buttocks and had some initial pain but that went away after about an hour and he has had no continued pain, no numbness tingling or weakness, he is on Plavix  but no other anticoagulants and did not hit his head, no loss of consciousness, no dizziness.    Fall       Prior to Admission medications  Medication Sig Start Date End Date Taking? Authorizing Provider  albuterol  (VENTOLIN  HFA) 108 (90 Base) MCG/ACT inhaler Inhale 2 puffs into the lungs every 6 (six) hours as needed for wheezing or shortness of breath. 08/05/23   Olalere, Jennet A, MD  ALPRAZolam  (XANAX ) 0.25 MG tablet TAKE TWO TABLETS BY MOUTH TWICE DAILY AS NEEDED FOR ANXIETY 05/05/24   Olalere, Adewale A, MD  aspirin  EC 81 MG tablet Take 1 tablet (81 mg total) by mouth daily. 12/16/20   Harvey Carlin BRAVO, MD  atorvastatin  (LIPITOR) 40 MG tablet Take 1 tablet (40 mg total) by mouth in the morning. 07/20/24   Kennyth Worth HERO, MD  carvedilol  (COREG ) 3.125 MG tablet Take 1 tablet (3.125 mg total) by mouth 2 (two) times daily with a meal. 04/24/24   Verlin Lonni BIRCH, MD  clopidogrel  (PLAVIX ) 75 MG tablet Take 1 tablet (75 mg total) by mouth daily. 08/31/24   Kennyth Worth HERO, MD  Coenzyme Q10 (CO Q 10) 100 MG CAPS Take 100 mg by mouth daily.    [provider]  diclofenac  Sodium (VOLTAREN ) 1 % GEL Apply 1 application topically 3 (three) times daily as needed (leg cramps/spasms).    [provider]  ENTRESTO  24-26 MG Take 1 tablet by mouth 2 (two) times daily. 06/01/24   Zenaida Morene PARAS, MD  famotidine  (PEPCID ) 20 MG tablet Take 1 tablet (20 mg total) by mouth daily. 07/20/24   Kennyth Worth HERO, MD  FARXIGA  10 MG TABS tablet Take 1 tablet (10 mg total) by mouth daily before breakfast. 06/01/24   Zenaida Morene PARAS, MD  FLUoxetine  (PROZAC ) 40 MG capsule Take 1 capsule (40 mg total) by mouth in the morning. 07/20/24   Kennyth Worth HERO, MD  furosemide  (LASIX ) 20 MG tablet Take 1 tablet (20 mg total) by mouth daily as needed for fluid or edema. FOR WEIGHT GAIN OF 3LB IN 24 HOUR OR 5LB IN A WEEK 06/01/24   Zenaida Morene PARAS, MD  Multiple Vitamins-Minerals (MULTIVITAMIN WITH MINERALS) tablet Take 1 tablet by mouth daily.    [provider]  nitroGLYCERIN  (NITROSTAT ) 0.4 MG SL tablet Place 1 tablet (0.4 mg total) under the tongue every 5 (five) minutes as needed for chest pain. 05/07/24   Verlin Lonni BIRCH, MD  pantoprazole  (PROTONIX ) 40 MG tablet Take 1 tablet (40 mg total) by mouth daily. Take 30-60 min before first meal of the day Patient taking differently: Take 40 mg by  mouth daily as needed (heartburn). Take 30-60 min before first meal of the day 03/11/23   Darlean Ozell NOVAK, MD  tamsulosin  (FLOMAX ) 0.4 MG CAPS capsule Take 1 capsule (0.4 mg total) by mouth daily. 07/20/24   Kennyth Worth HERO, MD  Tiotropium Bromide Monohydrate  (SPIRIVA  RESPIMAT) 2.5 MCG/ACT AERS Inhale 2 puffs into the lungs daily. Patient taking differently: Inhale 2 puffs into the lungs daily as needed (Shortness of breath/ wheezing). 08/05/23   Neda Jennet LABOR, MD    Allergies: Patient has no known allergies.    Review of Systems  Updated Vital Signs BP (!) 136/90 (BP Location: Right Arm)   Pulse 93   Temp 97.8 F (36.6 C) (Oral)   Resp 18   Ht 6' (1.829 m)   Wt 102.1 kg   SpO2 95%   BMI 30.52 kg/m   Physical Exam Vitals and nursing note reviewed.  Constitutional:      General: He  is not in acute distress.    Appearance: He is well-developed.  HENT:     Head: Normocephalic and atraumatic.     Mouth/Throat:     Mouth: Mucous membranes are moist.  Eyes:     Conjunctiva/sclera: Conjunctivae normal.  Cardiovascular:     Rate and Rhythm: Normal rate and regular rhythm.     Heart sounds: No murmur heard. Pulmonary:     Effort: Pulmonary effort is normal. No respiratory distress.     Breath sounds: Normal breath sounds.  Abdominal:     Palpations: Abdomen is soft.     Tenderness: There is no abdominal tenderness.  Musculoskeletal:        General: No swelling.     Cervical back: Neck supple.     Comments: No tenderness of C-spine T-spine or L-spine, no tenderness of hips, no extremity tenderness.  There is point tenderness to right lateral chest wall over the ninth rib with no crepitus.  Skin:    General: Skin is warm and dry.     Capillary Refill: Capillary refill takes less than 2 seconds.     Comments: Contusion of right buttock  Neurological:     General: No focal deficit present.     Mental Status: He is alert and oriented to person, place, and time.  Psychiatric:        Mood and Affect: Mood normal.     (all labs ordered are listed, but only abnormal results are displayed) Labs Reviewed - No data to display  EKG: None  Radiology: DG Ribs Unilateral W/Chest Right Result Date: 09/17/2024 EXAM: 1 VIEW XRAY OF THE RIGHT RIBS AND CHEST 09/17/2024 12:02:00 PM COMPARISON: None available. CLINICAL HISTORY: Fall with pain. FINDINGS: BONES: Mildly displaced fracture of lateral aspect of right ninth rib. LUNGS AND PLEURA: No consolidation or pulmonary edema. No pleural effusion or pneumothorax. HEART AND MEDIASTINUM: No acute abnormality of the cardiac and mediastinal silhouettes. Mild cardiomegaly. LINES AND TUBES: Stimulator device over right chest. A single lead extends into the lower right neck. A separate pleural lead terminates in the upper right lung zone.  SPINE: Multilevel degenerative disc disease of the spine. IMPRESSION: 1. Mildly displaced fracture of the lateral right ninth rib. No pneumothorax. Electronically signed by: Rogelia Myers MD 09/17/2024 12:12 PM EST RP Workstation: [id number]     Procedures   Medications Ordered in the ED - No data to display  Medical Decision Making Differential diagnosis includes but not limited to fracture, sprain, strain, contusion, dislocation, other  ED course presents ER for evaluation of right lateral chest wall pain worse with inspiration after a slip and fall on the ice a week and a half ago.  Was mechanical fall with no head injury or loss of consciousness.  He has a point tenderness to the right lateral chest wall which correlates to the finding on his x-ray of mildly displaced fracture on the right ninth rib laterally, there is no pulmonary contusion, no pleural effusion, no pneumothorax or other acute findings.  His inspire device also viewed on the chest x-ray.He does have some bruising to his right buttock but no tenderness here, this correlates to where he had fallen previously.  His vitals are normal, this area does not have any tenderness and he is ambulatory.  Do not feel he needs any further imaging or workup.  Plan on pain control follow-up and return precautions.  He is also given a incentive spirometer instructed how to use this to help prevent pneumonia.   Amount and/or Complexity of Data Reviewed External Data Reviewed: notes. Radiology: ordered and independent interpretation performed. Decision-making details documented in ED Course.    Details: Reviewed and agree with radiology read.  Risk Prescription drug management.        Final diagnoses:  None    ED Discharge Orders     None          Suellen Sherran LABOR, PA-C 09/17/24 1403    Suellen Sherran LABOR, [place] 09/17/24 2229  "

## 2024-09-17 NOTE — Discharge Instructions (Addendum)
 You were seen in the emergency room today for pain after a fall, your x-ray shows a fracture of your right ninth rib, which is where you are having pain.  Will prescribe you medication to help with pain, please follow-up closely with your primary care doctor, use the incentive spirometer.  Come back to the ER for new or worsening symptoms.  These can take about 6 weeks to heal in total but should gradually improve.  If you develop fever, productive cough or worsening pain or shortness of breath come back to the ER right away.

## 2024-09-17 NOTE — ED Triage Notes (Addendum)
 Pt to ER with c/o slipping and falling x2 last night.  Pt states right sided pain that increases with inspiration or movement.  Denies hit head, denies LOC. Takes blood thinners

## 2024-09-18 ENCOUNTER — Telehealth: Payer: Self-pay

## 2024-09-18 NOTE — Telephone Encounter (Signed)
 Transition Care Management Follow-up Telephone Call Date of discharge and from where: 09/17/24 [facility] How have you been since you were released from the hospital? Same Any questions or concerns? No  Items Reviewed: Did the pt receive and understand the discharge instructions provided? Yes  Medications obtained and verified? Yes  Other? No  Any new allergies since your discharge? No  Dietary orders reviewed? No Do you have support at home? Yes   Home Care and Equipment/Supplies: Were home health services ordered? not applicable If so, what is the name of the agency?   Has the agency set up a time to come to the patient's home? not applicable Were any new equipment or medical supplies ordered?  No What is the name of the medical supply agency?  Were you able to get the supplies/equipment? not applicable Do you have any questions related to the use of the equipment or supplies? No  Functional Questionnaire: (I = Independent and D = Dependent) ADLs: I  Bathing/Dressing- I  Meal Prep- I  Eating- I  Maintaining continence- I  Transferring/Ambulation- I  Managing Meds- I  Follow up appointments reviewed:  PCP Hospital f/u appt confirmed? No  Patient requesting to wait and schedule after the weekend if needed at that time [facility] f/u appt confirmed? NA  Are transportation arrangements needed? No  If their condition worsens, is the pt aware to call PCP or go to the Emergency Dept.? Yes Was the patient provided with contact information for the PCP's office or ED? Yes Was to pt encouraged to call back with questions or concerns? Yes

## 2024-09-30 ENCOUNTER — Ambulatory Visit (HOSPITAL_COMMUNITY): Admitting: Cardiology

## 2024-10-21 ENCOUNTER — Ambulatory Visit (HOSPITAL_BASED_OUTPATIENT_CLINIC_OR_DEPARTMENT_OTHER): Admitting: Pulmonary Disease

## 2024-11-16 ENCOUNTER — Encounter: Admitting: Pulmonary Disease
